# Patient Record
Sex: Female | Born: 1935 | Hispanic: No | Marital: Married | Smoking: Former smoker
Health system: Southern US, Community
[De-identification: ages and names within clinical notes are randomized; demographics above are authoritative.]

## PROBLEM LIST (undated history)

## (undated) DIAGNOSIS — K279 Peptic ulcer, site unspecified, unspecified as acute or chronic, without hemorrhage or perforation: Secondary | ICD-10-CM

## (undated) DIAGNOSIS — K219 Gastro-esophageal reflux disease without esophagitis: Secondary | ICD-10-CM

## (undated) DIAGNOSIS — C801 Malignant (primary) neoplasm, unspecified: Secondary | ICD-10-CM

## (undated) DIAGNOSIS — M5030 Other cervical disc degeneration, unspecified cervical region: Secondary | ICD-10-CM

## (undated) DIAGNOSIS — K229 Disease of esophagus, unspecified: Secondary | ICD-10-CM

## (undated) DIAGNOSIS — M5481 Occipital neuralgia: Secondary | ICD-10-CM

## (undated) DIAGNOSIS — R51 Headache: Secondary | ICD-10-CM

## (undated) DIAGNOSIS — I10 Essential (primary) hypertension: Secondary | ICD-10-CM

## (undated) DIAGNOSIS — M1990 Unspecified osteoarthritis, unspecified site: Secondary | ICD-10-CM

## (undated) DIAGNOSIS — R600 Localized edema: Secondary | ICD-10-CM

## (undated) DIAGNOSIS — K5120 Ulcerative (chronic) proctitis without complications: Secondary | ICD-10-CM

## (undated) DIAGNOSIS — M109 Gout, unspecified: Secondary | ICD-10-CM

## (undated) HISTORY — PX: APPENDECTOMY: SHX54

## (undated) HISTORY — DX: Peptic ulcer, site unspecified, unspecified as acute or chronic, without hemorrhage or perforation: K27.9

## (undated) HISTORY — DX: Gout, unspecified: M10.9

## (undated) HISTORY — PX: CHOLECYSTECTOMY: SHX55

## (undated) HISTORY — PX: DILATION AND CURETTAGE OF UTERUS: SHX78

## (undated) HISTORY — PX: TOTAL ABDOMINAL HYSTERECTOMY W/ BILATERAL SALPINGOOPHORECTOMY: SHX83

## (undated) HISTORY — PX: NECK SURGERY: SHX720

## (undated) HISTORY — PX: ABDOMINAL HYSTERECTOMY: SHX81

## (undated) HISTORY — PX: ESOPHAGOGASTRODUODENOSCOPY: SHX1529

## (undated) HISTORY — PX: ESOPHAGEAL DILATION: SHX303

---

## 1997-08-27 ENCOUNTER — Other Ambulatory Visit: Admission: RE | Admit: 1997-08-27 | Discharge: 1997-08-27 | Payer: Self-pay | Admitting: *Deleted

## 1998-09-14 ENCOUNTER — Other Ambulatory Visit: Admission: RE | Admit: 1998-09-14 | Discharge: 1998-09-14 | Payer: Self-pay | Admitting: *Deleted

## 1999-09-28 ENCOUNTER — Other Ambulatory Visit: Admission: RE | Admit: 1999-09-28 | Discharge: 1999-09-28 | Payer: Self-pay | Admitting: *Deleted

## 2002-12-24 ENCOUNTER — Ambulatory Visit (HOSPITAL_COMMUNITY): Admission: RE | Admit: 2002-12-24 | Discharge: 2002-12-24 | Payer: Self-pay | Admitting: Gastroenterology

## 2006-04-16 ENCOUNTER — Ambulatory Visit (HOSPITAL_COMMUNITY): Admission: RE | Admit: 2006-04-16 | Discharge: 2006-04-16 | Payer: Self-pay | Admitting: Orthopaedic Surgery

## 2006-04-16 ENCOUNTER — Encounter: Payer: Self-pay | Admitting: Vascular Surgery

## 2007-11-15 ENCOUNTER — Emergency Department (HOSPITAL_COMMUNITY): Admission: EM | Admit: 2007-11-15 | Discharge: 2007-11-15 | Payer: Self-pay | Admitting: Emergency Medicine

## 2008-04-17 ENCOUNTER — Emergency Department (HOSPITAL_COMMUNITY): Admission: EM | Admit: 2008-04-17 | Discharge: 2008-04-17 | Payer: Self-pay | Admitting: Emergency Medicine

## 2008-05-13 ENCOUNTER — Emergency Department (HOSPITAL_COMMUNITY): Admission: EM | Admit: 2008-05-13 | Discharge: 2008-05-13 | Payer: Self-pay | Admitting: Emergency Medicine

## 2010-02-02 ENCOUNTER — Encounter: Admission: RE | Admit: 2010-02-02 | Discharge: 2010-02-02 | Payer: Self-pay | Admitting: Internal Medicine

## 2010-03-02 ENCOUNTER — Ambulatory Visit (HOSPITAL_COMMUNITY)
Admission: RE | Admit: 2010-03-02 | Discharge: 2010-03-02 | Payer: Self-pay | Source: Home / Self Care | Admitting: Gastroenterology

## 2010-04-23 ENCOUNTER — Encounter: Payer: Self-pay | Admitting: Orthopaedic Surgery

## 2010-08-18 NOTE — Op Note (Signed)
   NAMEMELODY, CIRRINCIONE                           ACCOUNT NO.:  [id number]   MEDICAL RECORD NO.:  [id number]                   PATIENT TYPE:  AMB   LOCATION:  ENDO                                 FACILITY:  [facility]   PHYSICIAN:  Danise Edge, M.D.                DATE OF BIRTH:  09-Nov-1924   DATE OF PROCEDURE:  12/24/2002  DATE OF DISCHARGE:                                 OPERATIVE REPORT   PROCEDURE PERFORMED:  Colonoscopy.   ENDOSCOPIST:  Charolett Bumpers, M.D.   INDICATIONS FOR PROCEDURE:  Ms. Adlean Hardeman is a 77 year old female born  December 06, 1924.  Ms. Tukes is scheduled to undergo her first screening  colonoscopy with polypectomy to prevent colon cancer.   PREMEDICATION:  Versed 5 mg, Demerol 50 mg.   DESCRIPTION OF PROCEDURE:  After obtaining informed consent, Ms. Blacksher was  placed in the left lateral decubitus position.  I administered intravenous  Demerol and intravenous Versed to achieve conscious sedation for the  procedure.  The patient's blood pressure, oxygen saturations and cardiac  rhythm were monitored throughout the procedure and documented in the medical  record.   Anal inspection was normal.  Digital rectal exam was normal.  The pediatric  Olympus video colonoscope was introduced into the rectum and advanced to the  cecum.  Colonic preparation for the exam today was excellent.   Rectum:  Normal.   Sigmoid colon and descending colon:  Extensive left colonic diverticulosis.   Splenic flexure:  Normal.   Transverse colon:  Normal.   Hepatic flexure:  Normal.   Ascending colon:  Normal.   Cecum and ileocecal valve:  Normal.    ASSESSMENT:  Normal screening proctocolonoscopy to the cecum.  No endoscopic  evidence for the presence of colorectal neoplasia.  Extensive left colonic  diverticulosis without diverticulitis or diverticular stricture formation.                                                Danise Edge, M.D.    MJ/MEDQ  D:   12/24/2002  T:  12/24/2002  Job:  [id number]   cc:   Theressa Millard, M.D.  [address] [place]  [place] [address]  Fax: [phone]

## 2012-03-13 ENCOUNTER — Encounter (HOSPITAL_COMMUNITY): Payer: Self-pay | Admitting: Pharmacy Technician

## 2012-03-17 ENCOUNTER — Encounter (HOSPITAL_COMMUNITY): Payer: Self-pay | Admitting: *Deleted

## 2012-03-17 NOTE — Pre-Procedure Instructions (Signed)
Your procedure is scheduled ZO:[id number], March 25, 2012 Report to [facility] Admitting WU:[id number] Call this number if you have problems morning of your procedure:[phone]  Follow all bowel prep instructions per your doctor's orders.  Do not eat or drink anything after midnight the night before your procedure. You may brush your teeth, rinse out your mouth, but no water, no food, no chewing gum, no mints, no candies, no chewing tobacco.     Take these medicines the morning of your procedure with A SIP OF WATER:Norvasc   Please make arrangements for a responsible person to drive you home after the procedure. You cannot go home by cab/taxi. We recommend you have someone with you at home the first 24 hours after your procedure. Driver for procedure is spouse Willie  LEAVE ALL VALUABLES, JEWELRY, BILLFOLD AT HOME.  NO DENTURES, CONTACT LENSES ALLOWED IN THE ENDOSCOPY ROOM.   YOU MAY WEAR DEODORANT, PLEASE REMOVE ALL JEWELRY, WATCHES RINGS, BODY PIERCINGS AND LEAVE AT HOME.   WOMEN: NO MAKE-UP, LOTIONS PERFUMES

## 2012-03-25 ENCOUNTER — Encounter (HOSPITAL_COMMUNITY): Payer: Self-pay | Admitting: Anesthesiology

## 2012-03-25 ENCOUNTER — Encounter (HOSPITAL_COMMUNITY): Payer: Self-pay | Admitting: *Deleted

## 2012-03-25 ENCOUNTER — Encounter (HOSPITAL_COMMUNITY)
Admission: RE | Disposition: A | Discharge status: Home / Self Care | Payer: Self-pay | Source: Ambulatory Visit | Attending: Gastroenterology

## 2012-03-25 ENCOUNTER — Ambulatory Visit (HOSPITAL_COMMUNITY): Payer: Medicare Other | Admitting: Anesthesiology

## 2012-03-25 ENCOUNTER — Ambulatory Visit (HOSPITAL_COMMUNITY)
Admission: RE | Admit: 2012-03-25 | Discharge: 2012-03-25 | Disposition: A | Discharge status: Home / Self Care | Payer: Medicare Other | Source: Ambulatory Visit | Attending: Gastroenterology | Admitting: Gastroenterology

## 2012-03-25 DIAGNOSIS — K5730 Diverticulosis of large intestine without perforation or abscess without bleeding: Secondary | ICD-10-CM | POA: Insufficient documentation

## 2012-03-25 DIAGNOSIS — K219 Gastro-esophageal reflux disease without esophagitis: Secondary | ICD-10-CM | POA: Insufficient documentation

## 2012-03-25 DIAGNOSIS — I10 Essential (primary) hypertension: Secondary | ICD-10-CM | POA: Insufficient documentation

## 2012-03-25 DIAGNOSIS — K921 Melena: Secondary | ICD-10-CM | POA: Insufficient documentation

## 2012-03-25 DIAGNOSIS — Z79899 Other long term (current) drug therapy: Secondary | ICD-10-CM | POA: Insufficient documentation

## 2012-03-25 DIAGNOSIS — K6289 Other specified diseases of anus and rectum: Secondary | ICD-10-CM | POA: Insufficient documentation

## 2012-03-25 DIAGNOSIS — Z7982 Long term (current) use of aspirin: Secondary | ICD-10-CM | POA: Insufficient documentation

## 2012-03-25 DIAGNOSIS — K222 Esophageal obstruction: Secondary | ICD-10-CM | POA: Insufficient documentation

## 2012-03-25 DIAGNOSIS — M949 Disorder of cartilage, unspecified: Secondary | ICD-10-CM | POA: Insufficient documentation

## 2012-03-25 DIAGNOSIS — M899 Disorder of bone, unspecified: Secondary | ICD-10-CM | POA: Insufficient documentation

## 2012-03-25 HISTORY — DX: Essential (primary) hypertension: I10

## 2012-03-25 HISTORY — DX: Disease of esophagus, unspecified: K22.9

## 2012-03-25 HISTORY — DX: Localized edema: R60.0

## 2012-03-25 HISTORY — DX: Headache: R51

## 2012-03-25 HISTORY — PX: COLONOSCOPY WITH PROPOFOL: SHX5780

## 2012-03-25 HISTORY — DX: Unspecified osteoarthritis, unspecified site: M19.90

## 2012-03-25 SURGERY — COLONOSCOPY WITH PROPOFOL
Anesthesia: Monitor Anesthesia Care

## 2012-03-25 MED ORDER — ONDANSETRON HCL 4 MG/2ML IJ SOLN
INTRAMUSCULAR | Status: DC | PRN
  Administered 2012-03-25: 4 mg via INTRAVENOUS

## 2012-03-25 MED ORDER — KETAMINE HCL 10 MG/ML IJ SOLN
INTRAMUSCULAR | Status: DC | PRN
  Administered 2012-03-25: 20 mg via INTRAVENOUS
  Administered 2012-03-25 (×2): 5 mg via INTRAVENOUS
  Administered 2012-03-25: 15 mg via INTRAVENOUS
  Administered 2012-03-25: 5 mg via INTRAVENOUS

## 2012-03-25 MED ORDER — LIDOCAINE HCL (CARDIAC) 20 MG/ML IV SOLN
INTRAVENOUS | Status: DC | PRN
  Administered 2012-03-25: 100 mg via INTRAVENOUS

## 2012-03-25 MED ORDER — PROPOFOL 10 MG/ML IV EMUL
INTRAVENOUS | Status: DC | PRN
  Administered 2012-03-25: 150 ug/kg/min via INTRAVENOUS

## 2012-03-25 MED ORDER — LACTATED RINGERS IV SOLN
INTRAVENOUS | Status: DC
  Administered 2012-03-25: 09:00:00 via INTRAVENOUS

## 2012-03-25 MED ORDER — DEXAMETHASONE SODIUM PHOSPHATE 10 MG/ML IJ SOLN
INTRAMUSCULAR | Status: DC | PRN
  Administered 2012-03-25: 10 mg via INTRAVENOUS

## 2012-03-25 MED ORDER — METOCLOPRAMIDE HCL 5 MG/ML IJ SOLN
INTRAMUSCULAR | Status: DC | PRN
  Administered 2012-03-25: 10 mg via INTRAVENOUS

## 2012-03-25 SURGICAL SUPPLY — 21 items

## 2012-03-25 NOTE — H&P (Signed)
  Problem: Hematochezia associated with a normal hemoglobin. Normal screening colonoscopy in 2004.  History: The patient is an 78 year old female born 11/21/1924.  2004 patient underwent a normal baseline screening colonoscopy which did show left colonic diverticulosis.  Over the past few weeks, the patient has had intermittent small-volume hematochezia unassociated with abdominal pain or rectal pain.  Patient is scheduled to undergo a diagnostic colonoscopy to evaluate painless hematochezia.  Chronic medications: Aspirin. Calcium. Vitamin D. MiraLAX. Tylenol. Potassium. Fish oil. Hydrochlorothiazide. Amlodipine. Estradiol.  Past medical and surgical history: Abdominal hysterectomy with bilateral salpingo-oophorectomy. Cholecystectomy. Cervical disc surgery. Appendectomy. Hypertension. Osteoarthritis of both knees. Osteopenia. Gout. History of peptic ulcer disease. Gastroesophageal reflux complicated by a benign distal esophageal stricture. Osteopenia.  Medication allergies: Lisinopril causes tongue swelling.  Exam: The patient is alert and lying comfortably on the endoscopy stretcher. Mouth and throat appear normal. Lungs are clear to auscultation. Cardiac exam reveals a regular rhythm without audible murmurs. Abdomen is soft, flat, and nontender to palpation in all quadrants.  Plan: Proceed with diagnostic colonoscopy to evaluate painless hematochezia.

## 2012-03-25 NOTE — Anesthesia Preprocedure Evaluation (Signed)
Anesthesia Evaluation  Patient identified by MRN, date of birth, ID band Patient awake    Reviewed: Allergy & Precautions, H&P , NPO status , Patient's Chart, lab work & pertinent test results, reviewed documented beta blocker date and time   Airway Mallampati: II TM Distance: >3 FB Neck ROM: full    Dental No notable dental hx.    Pulmonary neg pulmonary ROS,  breath sounds clear to auscultation  Pulmonary exam normal       Cardiovascular Exercise Tolerance: Good hypertension, On Medications Rhythm:regular Rate:Normal     Neuro/Psych  Headaches, negative neurological ROS  negative psych ROS   GI/Hepatic negative GI ROS, Neg liver ROS,   Endo/Other  negative endocrine ROS  Renal/GU negative Renal ROS  negative genitourinary   Musculoskeletal   Abdominal   Peds  Hematology negative hematology ROS (+)   Anesthesia Other Findings   Reproductive/Obstetrics negative OB ROS                           Anesthesia Physical Anesthesia Plan  ASA: III  Anesthesia Plan: MAC   Post-op Pain Management:    Induction:   Airway Management Planned:   Additional Equipment:   Intra-op Plan:   Post-operative Plan:   Informed Consent: I have reviewed the patients History and Physical, chart, labs and discussed the procedure including the risks, benefits and alternatives for the proposed anesthesia with the patient or authorized representative who has indicated his/her understanding and acceptance.   Dental Advisory Given  Plan Discussed with: CRNA  Anesthesia Plan Comments:         Anesthesia Quick Evaluation

## 2012-03-25 NOTE — Op Note (Signed)
Procedure: Diagnostic colonoscopy  Indication: Painless hematochezia  Endoscopist: Danise Edge  Premedication: Propofol administered by anesthesia  Procedure: The patient was placed in the left lateral decubitus position. Anal inspection and digital rectal exam were normal. The Pentax pediatric colonoscope was introduced into the rectum and advanced to the cecum. A normal-appearing ileocecal valve and appendiceal orifice were identified. Colonic preparation for the exam today was good.  Rectum: There is generalized proctocolitis manifested by the loss of the mucosal vascular pattern and as a granular appearing rectal mucosa with slight mucosal friability but no ulcers. Biopsies were performed.  Sigmoid colon and descending colon. Extensive left colonic diverticulosis.  Splenic flexure: Normal.  Transverse colon. Normal.  Hepatic flexure. Normal  Ascending colon. Normal.  Cecum and ileocecal valve. Normal.  Assessment: #1. Extensive left colonic diverticulosis #2. Mild generalized proctitis with biopsies pending

## 2012-03-25 NOTE — Anesthesia Postprocedure Evaluation (Signed)
  Anesthesia Post-op Note  Patient: Tiffany [name]  Procedure(s) Performed: Procedure(s) (LRB): COLONOSCOPY WITH PROPOFOL (N/A)  Patient Location: PACU  Anesthesia Type: MAC  Level of Consciousness: awake and alert   Airway and Oxygen Therapy: Patient Spontanous Breathing  Post-op Pain: mild  Post-op Assessment: Post-op Vital signs reviewed, Patient's Cardiovascular Status Stable, Respiratory Function Stable, Patent Airway and No signs of Nausea or vomiting  Last Vitals:  Filed Vitals:   03/25/12 1020  BP: 132/78  Temp:   Resp:     Post-op Vital Signs: stable   Complications: No apparent anesthesia complications

## 2012-03-25 NOTE — Preoperative (Signed)
Beta Blockers   Reason not to administer Beta Blockers:Not Applicable, not on home BB 

## 2012-03-25 NOTE — Transfer of Care (Signed)
Immediate Anesthesia Transfer of Care Note  Patient: Tiffany [name]  Procedure(s) Performed: Procedure(s) (LRB) with comments: COLONOSCOPY WITH PROPOFOL (N/A)  Patient Location: PACU and Endoscopy Unit  Anesthesia Type:MAC  Level of Consciousness: awake, alert , oriented, patient cooperative and responds to stimulation  Airway & Oxygen Therapy: Patient Spontanous Breathing and Patient connected to face mask oxygen  Post-op Assessment: Report given to PACU RN, Post -op Vital signs reviewed and stable and Patient moving all extremities  Post vital signs: Reviewed and stable  Complications: No apparent anesthesia complications

## 2012-03-27 ENCOUNTER — Encounter (HOSPITAL_COMMUNITY): Payer: Self-pay | Admitting: Gastroenterology

## 2012-04-02 DIAGNOSIS — K5120 Ulcerative (chronic) proctitis without complications: Secondary | ICD-10-CM

## 2012-04-02 HISTORY — DX: Ulcerative (chronic) proctitis without complications: K51.20

## 2014-04-21 ENCOUNTER — Other Ambulatory Visit: Payer: Self-pay | Admitting: Gastroenterology

## 2014-04-26 ENCOUNTER — Encounter (HOSPITAL_COMMUNITY)
Admission: RE | Disposition: A | Discharge status: Home / Self Care | Payer: Self-pay | Source: Ambulatory Visit | Attending: Gastroenterology

## 2014-04-26 ENCOUNTER — Encounter (HOSPITAL_COMMUNITY): Payer: Self-pay | Admitting: *Deleted

## 2014-04-26 ENCOUNTER — Ambulatory Visit (HOSPITAL_COMMUNITY)
Admission: RE | Admit: 2014-04-26 | Discharge: 2014-04-26 | Disposition: A | Discharge status: Home / Self Care | Payer: Medicare Other | Source: Ambulatory Visit | Attending: Gastroenterology | Admitting: Gastroenterology

## 2014-04-26 DIAGNOSIS — M8580 Other specified disorders of bone density and structure, unspecified site: Secondary | ICD-10-CM | POA: Insufficient documentation

## 2014-04-26 DIAGNOSIS — M5030 Other cervical disc degeneration, unspecified cervical region: Secondary | ICD-10-CM | POA: Diagnosis not present

## 2014-04-26 DIAGNOSIS — K219 Gastro-esophageal reflux disease without esophagitis: Secondary | ICD-10-CM | POA: Insufficient documentation

## 2014-04-26 DIAGNOSIS — M170 Bilateral primary osteoarthritis of knee: Secondary | ICD-10-CM | POA: Insufficient documentation

## 2014-04-26 DIAGNOSIS — K5120 Ulcerative (chronic) proctitis without complications: Secondary | ICD-10-CM | POA: Insufficient documentation

## 2014-04-26 DIAGNOSIS — I10 Essential (primary) hypertension: Secondary | ICD-10-CM | POA: Diagnosis not present

## 2014-04-26 DIAGNOSIS — M5481 Occipital neuralgia: Secondary | ICD-10-CM | POA: Insufficient documentation

## 2014-04-26 HISTORY — PX: FLEXIBLE SIGMOIDOSCOPY: SHX5431

## 2014-04-26 SURGERY — SIGMOIDOSCOPY, FLEXIBLE
Anesthesia: Moderate Sedation

## 2014-04-26 MED ORDER — FLEET ENEMA 7-19 GM/118ML RE ENEM
ENEMA | RECTAL | Status: AC
  Filled 2014-04-26: qty 1

## 2014-04-26 MED ORDER — SODIUM CHLORIDE 0.9 % IV SOLN: INTRAVENOUS | Status: DC

## 2014-04-26 MED ORDER — FLEET ENEMA 7-19 GM/118ML RE ENEM
1.0000 | ENEMA | Freq: Once | RECTAL | Status: AC
  Administered 2014-04-26: 1 via RECTAL

## 2014-04-26 NOTE — Discharge Instructions (Signed)
Flexible Sigmoidoscopy, Care After °Refer to this sheet in the next few weeks. These instructions provide you with information on caring for yourself after your procedure. Your health care provider may also give you more specific instructions. Your treatment has been planned according to current medical practices, but problems sometimes occur. Call your health care provider if you have any problems or questions after your procedure. °WHAT TO EXPECT AFTER THE PROCEDURE °After your procedure, it is typical to have the following:  °· Abdominal cramps. °· Bloating. °· A small amount of rectal bleeding if you had a biopsy. °HOME CARE INSTRUCTIONS °· Only take over-the-counter or prescription medicines for pain, fever, or discomfort as directed by your health care provider. °· Resume your normal diet and activities as directed by your health care provider. °SEEK MEDICAL CARE IF: °· You have abdominal pain or cramping that lasts longer than 1 hour after the procedure. °· You continue to have small amounts of rectal bleeding after 24 hours. °· You have nausea or vomiting. °· You feel weak or dizzy. °SEEK IMMEDIATE MEDICAL CARE IF:  °· You have a fever. °· You pass large blood clots or see a large amount of blood in the toilet after having a bowel movement. This may also occur 10-14 days after the procedure. It is more likely if you had a biopsy. °· You develop abdominal pain that is not relieved with medicine or your abdominal pain gets worse. °· You have nausea or vomiting for more than 24 hours after the procedure. °Document Released: 03/24/2013 Document Reviewed: 03/24/2013 °ExitCare® Patient Information ©2015 [employer]. This information is not intended to replace advice given to you by your health care provider. Make sure you discuss any questions you have with your health care provider. ° °

## 2014-04-26 NOTE — Op Note (Signed)
Procedure: Diagnostic proctosigmoidoscopy. 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 Resolved hematochezia.  Endoscopist: Earle Gell  Premedication: None  Procedure: The patient was placed in the left lateral decubitus position. Anal inspection and digital rectal exam were normal. Prior to the exam today, the patient received 2 fleets enemas in the endoscopy suite. The Pentax pediatric colonoscope was introduced into the rectum and advanced to approximately 25 cm from the anal verge. There was a moderate amount of retained liquid and solid stool in the rectum. The colonic mucosa appeared normal. There was no endoscopic evidence for the presence of ulcerative proctitis.  Assessment: Normal flexible proctosigmoidoscopy performed to 25 cm from the anal verge.

## 2014-04-26 NOTE — H&P (Signed)
  Problems: Hematochezia and rectal pain. 03/25/2012 colonoscopy showed ulcerative proctitis.  History: The patient is an 81 year old female born 1924/11/20. In December 2013, she underwent a normal screening colonoscopy. Ulcerative proctitis was diagnosed.  The patient describes diminished rectal sensation prior to having a bowel movement and difficulty expelling a bowel movement leaving her with the sensation of incomplete evacuation. Last week she had an episode of small volume hematochezia which resolved after 2 days of mesalamine rectal suppositories. She also has difficulty emptying her urinary bladder. She has developed rectal discomfort.  She is scheduled to undergo diagnostic flexible proctosigmoidoscopy.  Past medical history: Hypertension. Osteoarthritis of the knees. Cervical degenerative disc disease. Osteopenia. Gastroesophageal reflux. Occipital neuralgia. Ulcerative proctitis. Hysterectomy. BSO. Cholecystectomy. Cervical spine surgery. Appendectomy. Colonoscopy.  Medication allergies: Lisinopril causes tongue swelling.  Exam: The patient is alert and lying comfortably on the endoscopy stretcher. Abdomen is soft and nontender to palpation. Lungs are clear to auscultation. Cardiac exam reveals a regular rhythm.  Plan: Proceed with diagnostic flexible proctosigmoidoscopy

## 2014-04-27 ENCOUNTER — Encounter (HOSPITAL_COMMUNITY): Payer: Self-pay | Admitting: Gastroenterology

## 2014-09-01 DIAGNOSIS — C801 Malignant (primary) neoplasm, unspecified: Secondary | ICD-10-CM

## 2014-09-01 HISTORY — DX: Malignant (primary) neoplasm, unspecified: C80.1

## 2014-10-16 ENCOUNTER — Encounter (HOSPITAL_COMMUNITY): Payer: Self-pay | Admitting: *Deleted

## 2014-10-16 ENCOUNTER — Observation Stay (HOSPITAL_COMMUNITY)
Admission: EM | Admit: 2014-10-16 | Discharge: 2014-10-18 | Disposition: A | Discharge status: Home / Self Care | Payer: Medicare Other | Attending: Internal Medicine | Admitting: Internal Medicine

## 2014-10-16 ENCOUNTER — Emergency Department (HOSPITAL_COMMUNITY): Payer: Medicare Other

## 2014-10-16 DIAGNOSIS — I700 Atherosclerosis of aorta: Secondary | ICD-10-CM | POA: Insufficient documentation

## 2014-10-16 DIAGNOSIS — R0989 Other specified symptoms and signs involving the circulatory and respiratory systems: Secondary | ICD-10-CM | POA: Insufficient documentation

## 2014-10-16 DIAGNOSIS — I10 Essential (primary) hypertension: Secondary | ICD-10-CM | POA: Diagnosis not present

## 2014-10-16 DIAGNOSIS — R509 Fever, unspecified: Secondary | ICD-10-CM | POA: Diagnosis present

## 2014-10-16 DIAGNOSIS — Z87891 Personal history of nicotine dependence: Secondary | ICD-10-CM | POA: Diagnosis not present

## 2014-10-16 DIAGNOSIS — A09 Infectious gastroenteritis and colitis, unspecified: Secondary | ICD-10-CM | POA: Diagnosis not present

## 2014-10-16 DIAGNOSIS — R197 Diarrhea, unspecified: Principal | ICD-10-CM | POA: Insufficient documentation

## 2014-10-16 DIAGNOSIS — N179 Acute kidney failure, unspecified: Secondary | ICD-10-CM | POA: Diagnosis not present

## 2014-10-16 DIAGNOSIS — N289 Disorder of kidney and ureter, unspecified: Secondary | ICD-10-CM

## 2014-10-16 DIAGNOSIS — N390 Urinary tract infection, site not specified: Secondary | ICD-10-CM | POA: Insufficient documentation

## 2014-10-16 DIAGNOSIS — R0902 Hypoxemia: Secondary | ICD-10-CM

## 2014-10-16 HISTORY — DX: Occipital neuralgia: M54.81

## 2014-10-16 HISTORY — DX: Gastro-esophageal reflux disease without esophagitis: K21.9

## 2014-10-16 HISTORY — DX: Ulcerative (chronic) proctitis without complications: K51.20

## 2014-10-16 HISTORY — DX: Other cervical disc degeneration, unspecified cervical region: M50.30

## 2014-10-16 LAB — URINALYSIS, ROUTINE W REFLEX MICROSCOPIC
BILIRUBIN URINE: NEGATIVE
Glucose, UA: NEGATIVE mg/dL
Hgb urine dipstick: NEGATIVE
KETONES UR: 15 mg/dL — AB
LEUKOCYTES UA: NEGATIVE
Nitrite: NEGATIVE
PH: 6.5 (ref 5.0–8.0)
Protein, ur: NEGATIVE mg/dL
SPECIFIC GRAVITY, URINE: 1.013 (ref 1.005–1.030)
UROBILINOGEN UA: 0.2 mg/dL (ref 0.0–1.0)

## 2014-10-16 LAB — CBC
HCT: 35.2 % — ABNORMAL LOW (ref 36.0–46.0)
Hemoglobin: 10.9 g/dL — ABNORMAL LOW (ref 12.0–15.0)
MCH: 27.9 pg (ref 26.0–34.0)
MCHC: 31 g/dL (ref 30.0–36.0)
MCV: 90 fL (ref 78.0–100.0)
PLATELETS: 377 10*3/uL (ref 150–400)
RBC: 3.91 MIL/uL (ref 3.87–5.11)
RDW: 16.5 % — AB (ref 11.5–15.5)
WBC: 11.2 10*3/uL — ABNORMAL HIGH (ref 4.0–10.5)

## 2014-10-16 LAB — BASIC METABOLIC PANEL
ANION GAP: 10 (ref 5–15)
BUN: 21 mg/dL — AB (ref 6–20)
CO2: 24 mmol/L (ref 22–32)
Calcium: 9.8 mg/dL (ref 8.9–10.3)
Chloride: 101 mmol/L (ref 101–111)
Creatinine, Ser: 1.52 mg/dL — ABNORMAL HIGH (ref 0.44–1.00)
GFR calc non Af Amer: 29 mL/min — ABNORMAL LOW (ref >60)
GFR, EST AFRICAN AMERICAN: 34 mL/min — AB (ref >60)
GLUCOSE: 174 mg/dL — AB (ref 65–99)
POTASSIUM: 4.5 mmol/L (ref 3.5–5.1)
Sodium: 135 mmol/L (ref 135–145)

## 2014-10-16 LAB — I-STAT TROPONIN, ED: Troponin i, poc: 0 ng/mL (ref 0.00–0.08)

## 2014-10-16 LAB — BRAIN NATRIURETIC PEPTIDE: B Natriuretic Peptide: 114.2 pg/mL — ABNORMAL HIGH (ref 0.0–100.0)

## 2014-10-16 LAB — I-STAT CG4 LACTIC ACID, ED: LACTIC ACID, VENOUS: 1.67 mmol/L (ref 0.5–2.0)

## 2014-10-16 MED ORDER — AMLODIPINE BESYLATE 5 MG PO TABS
5.0000 mg | ORAL_TABLET | Freq: Every day | ORAL | Status: DC
  Administered 2014-10-17 – 2014-10-18 (×2): 5 mg via ORAL
  Filled 2014-10-16 (×4): qty 1

## 2014-10-16 MED ORDER — ACETAMINOPHEN 500 MG PO TABS
1000.0000 mg | ORAL_TABLET | Freq: Once | ORAL | Status: AC
  Administered 2014-10-16: 1000 mg via ORAL
  Filled 2014-10-16: qty 2

## 2014-10-16 MED ORDER — ACETAMINOPHEN 650 MG RE SUPP: 650.0000 mg | Freq: Four times a day (QID) | RECTAL | Status: DC | PRN

## 2014-10-16 MED ORDER — CIPROFLOXACIN IN D5W 200 MG/100ML IV SOLN
200.0000 mg | Freq: Two times a day (BID) | INTRAVENOUS | Status: DC
  Administered 2014-10-16 – 2014-10-17 (×2): 200 mg via INTRAVENOUS
  Filled 2014-10-16 (×3): qty 100

## 2014-10-16 MED ORDER — ESTRADIOL 1 MG PO TABS
0.5000 mg | ORAL_TABLET | Freq: Every day | ORAL | Status: DC
  Administered 2014-10-16 – 2014-10-18 (×3): 0.5 mg via ORAL
  Filled 2014-10-16 (×4): qty 0.5

## 2014-10-16 MED ORDER — PHENAZOPYRIDINE HCL 100 MG PO TABS
95.0000 mg | ORAL_TABLET | Freq: Three times a day (TID) | ORAL | Status: DC | PRN
  Filled 2014-10-16: qty 1

## 2014-10-16 MED ORDER — CALCIUM CARBONATE-VITAMIN D 500-200 MG-UNIT PO TABS
1.0000 | ORAL_TABLET | Freq: Every day | ORAL | Status: DC
  Administered 2014-10-17 – 2014-10-18 (×2): 1 via ORAL
  Filled 2014-10-16 (×3): qty 1

## 2014-10-16 MED ORDER — SODIUM CHLORIDE 0.9 % IV SOLN: INTRAVENOUS | Status: DC

## 2014-10-16 MED ORDER — SODIUM CHLORIDE 0.9 % IV SOLN
INTRAVENOUS | Status: DC
  Administered 2014-10-16 – 2014-10-17 (×3): via INTRAVENOUS

## 2014-10-16 MED ORDER — ACETAMINOPHEN 325 MG PO TABS
650.0000 mg | ORAL_TABLET | Freq: Four times a day (QID) | ORAL | Status: DC
  Administered 2014-10-16 – 2014-10-18 (×6): 650 mg via ORAL
  Filled 2014-10-16 (×6): qty 2

## 2014-10-16 MED ORDER — COLCHICINE 0.6 MG PO TABS
0.6000 mg | ORAL_TABLET | ORAL | Status: DC | PRN
Start: 2014-10-16 — End: 2014-10-18

## 2014-10-16 MED ORDER — METRONIDAZOLE IN NACL 5-0.79 MG/ML-% IV SOLN
500.0000 mg | Freq: Three times a day (TID) | INTRAVENOUS | Status: DC
  Administered 2014-10-16 – 2014-10-17 (×3): 500 mg via INTRAVENOUS
  Filled 2014-10-16 (×4): qty 100

## 2014-10-16 MED ORDER — HEPARIN SODIUM (PORCINE) 5000 UNIT/ML IJ SOLN
5000.0000 [IU] | Freq: Three times a day (TID) | INTRAMUSCULAR | Status: DC
  Administered 2014-10-16 – 2014-10-18 (×5): 5000 [IU] via SUBCUTANEOUS
  Filled 2014-10-16 (×9): qty 1

## 2014-10-16 MED ORDER — ACETAMINOPHEN 325 MG PO TABS: 650.0000 mg | ORAL_TABLET | Freq: Four times a day (QID) | ORAL | Status: DC | PRN

## 2014-10-16 NOTE — ED Notes (Signed)
Pt reports having recent UTI and has been on two different antibiotics but now having chills.

## 2014-10-16 NOTE — Progress Notes (Signed)
Received report from Lennette Bihari, RN in ED for transfer of pt to [phone].

## 2014-10-16 NOTE — H&P (Signed)
[facility] History and Physical  Tiffany [name] RCB:[id number] DOB: 1924/07/06 DOA: 10/16/2014  Referring physician: EDP PCP: Kandice Hams, MD   Chief Complaint: Fever   HPI: Tiffany [name] is a 81 y.o. female who presents to the ED with c/o 1 day history of fever.  She has been having dysuria for the past 2-3 months, numerous courses of ABx have not improved this symptom.  Today she has had multiple episodes of diarrhea which are new, and also has developed "chills".  UA was negative in the ED, Tm in ED was 102.1.  Review of Systems: Systems reviewed.  As above, otherwise negative  Past Medical History  Diagnosis Date  . Hypertension   . Edema leg     feet and ankles  . Headache(784.0)   . Esophagus disorder     Had esophagus stretched  . Arthritis      knees 03-10-12 had Cortisone injection  . Occipital neuralgia   . GERD (gastroesophageal reflux disease)   . Ulcerative proctitis   . DDD (degenerative disc disease), cervical    Past Surgical History  Procedure Laterality Date  . Cholecystectomy    . Appendectomy    . Abdominal hysterectomy    . Dilation and curettage of uterus    . Neck surgery      20 years ago  . Esophagogastroduodenoscopy    . Colonoscopy with propofol  03/25/2012    Procedure: COLONOSCOPY WITH PROPOFOL;  Surgeon: Garlan Fair, MD;  Location: [facility];  Service: Endoscopy;  Laterality: N/A;  . Flexible sigmoidoscopy N/A 04/26/2014    Procedure: FLEXIBLE SIGMOIDOSCOPY - UnSedated;  Surgeon: Garlan Fair, MD;  Location: [facility];  Service: Endoscopy;  Laterality: N/A;   Social History:  reports that she quit smoking about 30 years ago. She has never used smokeless tobacco. She reports that she drinks alcohol. She reports that she does not use illicit drugs.  Allergies  Allergen Reactions  . Indomethacin Swelling and Other (See Comments)    TONGUE SWOLLEN  . Lisinopril Swelling and Other (See Comments)    Tongue swelling     History reviewed. No pertinent family history.   Prior to Admission medications   Medication Sig Start Date End Date Taking? Authorizing Provider  acetaminophen (TYLENOL) 650 MG CR tablet Take 1,300 mg by mouth 2 (two) times daily.   Yes Historical Provider, MD  amLODipine (NORVASC) 5 MG tablet Take 5 mg by mouth daily before breakfast.   Yes Historical Provider, MD  calcium-vitamin D (OSCAL WITH D) 500-200 MG-UNIT per tablet Take 1 tablet by mouth daily.   Yes Historical Provider, MD  colchicine 0.6 MG tablet Take 0.6 mg by mouth as needed (FOR GOUT FLAREUP).   Yes Historical Provider, MD  estradiol (ESTRACE) 0.5 MG tablet Take 0.5 mg by mouth daily. 08/02/14  Yes Historical Provider, MD  hydrochlorothiazide (HYDRODIURIL) 25 MG tablet Take 25 mg by mouth daily before breakfast.   Yes Historical Provider, MD  nitrofurantoin (MACRODANTIN) 100 MG capsule Take 100 mg by mouth 2 (two) times daily. FOR 7 DAYS 10/15/14 10/21/14 Yes Historical Provider, MD  Omega-3 Fatty Acids (FISH OIL) 300 MG CAPS Take 1 capsule by mouth every evening.   Yes Historical Provider, MD  phenazopyridine (PYRIDIUM) 95 MG tablet Take 95 mg by mouth 3 (three) times daily as needed for pain.   Yes Historical Provider, MD  potassium chloride SA (K-DUR,KLOR-CON) 20 MEQ tablet Take 20 mEq by mouth every evening.  Yes Historical Provider, MD   Physical Exam: Filed Vitals:   10/16/14 1939  BP: 127/60  Pulse: 97  Temp:   Resp: 24    BP 127/60 mmHg  Pulse 97  Temp(Src) 102.1 F (38.9 C) (Rectal)  Resp 24  SpO2 92%  General Appearance:    Alert, oriented, no distress, appears stated age  Head:    Normocephalic, atraumatic  Eyes:    PERRL, EOMI, sclera non-icteric        Nose:   Nares without drainage or epistaxis. Mucosa, turbinates normal  Throat:   Moist mucous membranes. Oropharynx without erythema or exudate.  Neck:   Supple. No carotid bruits.  No thyromegaly.  No lymphadenopathy.   Back:     No CVA  tenderness, no spinal tenderness  Lungs:     Clear to auscultation bilaterally, without wheezes, rhonchi or rales  Chest wall:    No tenderness to palpitation  Heart:    Regular rate and rhythm without murmurs, gallops, rubs  Abdomen:     Soft, non-tender, nondistended, normal bowel sounds, no organomegaly  Genitalia:    deferred  Rectal:    deferred  Extremities:   No clubbing, cyanosis or edema.  Pulses:   2+ and symmetric all extremities  Skin:   Skin color, texture, turgor normal, no rashes or lesions  Lymph nodes:   Cervical, supraclavicular, and axillary nodes normal  Neurologic:   CNII-XII intact. Normal strength, sensation and reflexes      throughout    Labs on Admission:  Basic Metabolic Panel:  Recent Labs Lab 10/16/14 1611  NA 135  K 4.5  CL 101  CO2 24  GLUCOSE 174*  BUN 21*  CREATININE 1.52*  CALCIUM 9.8   Liver Function Tests: No results for input(s): AST, ALT, ALKPHOS, BILITOT, PROT, ALBUMIN in the last 168 hours. No results for input(s): LIPASE, AMYLASE in the last 168 hours. No results for input(s): AMMONIA in the last 168 hours. CBC:  Recent Labs Lab 10/16/14 1611  WBC 11.2*  HGB 10.9*  HCT 35.2*  MCV 90.0  PLT 377   Cardiac Enzymes: No results for input(s): CKTOTAL, CKMB, CKMBINDEX, TROPONINI in the last 168 hours.  BNP (last 3 results) No results for input(s): PROBNP in the last 8760 hours. CBG: No results for input(s): GLUCAP in the last 168 hours.  Radiological Exams on Admission: Dg Chest 2 View  10/16/2014   CLINICAL DATA:  Urinary tract infection.  Taking antibiotics.  EXAM: CHEST  2 VIEW  COMPARISON:  02/02/2010  FINDINGS: The heart size and mediastinal contours are within normal limits. Pulmonary vascular congestion noted. Aortic atherosclerosis noted. Both lungs are clear. The visualized skeletal structures are unremarkable.  IMPRESSION: 1. Pulmonary vascular congestion 2. Aortic atherosclerosis.   Electronically Signed   By: Kerby Moors M.D.   On: 10/16/2014 18:37    EKG: Independently reviewed.  Assessment/Plan Principal Problem:   Diarrhea, infectious, adult Active Problems:   Fever   Renal insufficiency   1. Diarrhea - presumed infectious and the cause of her fever 1. Empiric cipro / flagyl 2. Tylenol PRN fever 3. GI pathogen panel 2. Renal insufficiency - unclear baseline 1. Gentle hydration with IVF and hold diuretics in case this is acute 2. Repeat BMP in AM   Code Status: Full Code  Family Communication: Husband at bedside Disposition Plan: Admit to obs   Time spent: 50 min  GARDNER, JARED M. [facility] Pager [phone]  If 7AM-7PM, please  contact the day team taking care of the patient [email or web address] Password [email or web address] 10/16/2014, 8:11 PM

## 2014-10-16 NOTE — Progress Notes (Signed)
ANTIBIOTIC CONSULT NOTE - INITIAL  Pharmacy Consult for ciprofloxacin  Indication: intra-abdominal infection  Allergies  Allergen Reactions  . Indomethacin Swelling and Other (See Comments)    TONGUE SWOLLEN  . Lisinopril Swelling and Other (See Comments)    Tongue swelling   Vital Signs: Temp: 102.1 F (38.9 C) (07/16 1840) Temp Source: Rectal (07/16 1840) BP: 127/60 mmHg (07/16 1939) Pulse Rate: 97 (07/16 1939) Intake/Output from previous day:   Intake/Output from this shift:    Labs:  Recent Labs  10/16/14 1611  WBC 11.2*  HGB 10.9*  PLT 377  CREATININE 1.52*   CrCl cannot be calculated (Unknown ideal weight.). No results for input(s): VANCOTROUGH, VANCOPEAK, VANCORANDOM, GENTTROUGH, GENTPEAK, GENTRANDOM, TOBRATROUGH, TOBRAPEAK, TOBRARND, AMIKACINPEAK, AMIKACINTROU, AMIKACIN in the last 72 hours.   Microbiology: No results found for this or any previous visit (from the past 720 hour(s)).  Medical History: Past Medical History  Diagnosis Date  . Hypertension   . Edema leg     feet and ankles  . Headache(784.0)   . Esophagus disorder     Had esophagus stretched  . Arthritis      knees 03-10-12 had Cortisone injection  . Occipital neuralgia   . GERD (gastroesophageal reflux disease)   . Ulcerative proctitis   . DDD (degenerative disc disease), cervical    Assessment: 81 year old female presenting with a 1 day hx of fever, new onset diarrhea and chills. 2 - 3 month hx of dysuria with no relief s/p multiple abx  ID: Presumed infectious diarrhea. Febrile to 102.33F. WBC 11.2  Cipro 7/16>> Metronidazole 7/16>>  Stool Panel C Dif Blood x 2 Urine  Plan:  -Cipro 200 mg IV q12h -F/U clinical status, renal function, cultures  Levester Fresh, PharmD, BCPS Clinical Pharmacist Pager [phone] 10/16/2014 8:30 PM

## 2014-10-16 NOTE — ED Provider Notes (Signed)
CSN: [id number]     Arrival date & time 10/16/14  1449 History   First MD Initiated Contact with Patient 10/16/14 1720     Chief Complaint  Patient presents with  . Urinary Tract Infection  . Fever     HPI Pt was seen at 1740.  Per pt, c/o gradual onset and persistence of constant urinary frequency and urgency for the past 2 to 3 months. Pt states she has been on several courses of abx "for a UTI" without improvement. Pt was evaluated by Uro MD yesterday and rx macrobid. Pt states today she "felt chilled," so she came to the ED for evaluation. Pt did not take her temperature at home. Denies flank pain, no objective fevers, no abd pain, no N/V/D, no vaginal bleeding/discharge, no rash.   Past Medical History  Diagnosis Date  . Hypertension   . Edema leg     feet and ankles  . Headache(784.0)   . Esophagus disorder     Had esophagus stretched  . Arthritis      knees 03-10-12 had Cortisone injection  . Occipital neuralgia   . GERD (gastroesophageal reflux disease)   . Ulcerative proctitis   . DDD (degenerative disc disease), cervical    Past Surgical History  Procedure Laterality Date  . Cholecystectomy    . Appendectomy    . Abdominal hysterectomy    . Dilation and curettage of uterus    . Neck surgery      20 years ago  . Esophagogastroduodenoscopy    . Colonoscopy with propofol  03/25/2012    Procedure: COLONOSCOPY WITH PROPOFOL;  Surgeon: Garlan Fair, MD;  Location: [facility];  Service: Endoscopy;  Laterality: N/A;  . Flexible sigmoidoscopy N/A 04/26/2014    Procedure: FLEXIBLE SIGMOIDOSCOPY - UnSedated;  Surgeon: Garlan Fair, MD;  Location: [facility];  Service: Endoscopy;  Laterality: N/A;    History  Substance Use Topics  . Smoking status: Former Smoker    Quit date: 11/01/1983  . Smokeless tobacco: Never Used  . Alcohol Use: Yes    Review of Systems ROS: Statement: All systems negative except as marked or noted in the HPI; Constitutional: Negative  for fever and +chills. ; ; Eyes: Negative for eye pain, redness and discharge. ; ; ENMT: Negative for ear pain, hoarseness, nasal congestion, sinus pressure and sore throat. ; ; Cardiovascular: Negative for chest pain, palpitations, diaphoresis, dyspnea and peripheral edema. ; ; Respiratory: Negative for cough, wheezing and stridor. ; ; Gastrointestinal: Negative for nausea, vomiting, diarrhea, abdominal pain, blood in stool, hematemesis, jaundice and rectal bleeding. . ; ; Genitourinary: +urinary frequency/urgency. Negative for flank pain and hematuria. ; ; Musculoskeletal: Negative for back pain and neck pain. Negative for swelling and trauma.; ; Skin: Negative for pruritus, rash, abrasions, blisters, bruising and skin lesion.; ; Neuro: Negative for headache, lightheadedness and neck stiffness. Negative for weakness, altered level of consciousness , altered mental status, extremity weakness, paresthesias, involuntary movement, seizure and syncope.     Allergies  Indomethacin and Lisinopril  Home Medications   Prior to Admission medications   Medication Sig Start Date End Date Taking? Authorizing Provider  acetaminophen (TYLENOL) 650 MG CR tablet Take 1,300 mg by mouth 2 (two) times daily.   Yes Historical Provider, MD  amLODipine (NORVASC) 5 MG tablet Take 5 mg by mouth daily before breakfast.   Yes Historical Provider, MD  calcium-vitamin D (OSCAL WITH D) 500-200 MG-UNIT per tablet Take 1 tablet by mouth daily.  Yes Historical Provider, MD  colchicine 0.6 MG tablet Take 0.6 mg by mouth as needed (FOR GOUT FLAREUP).   Yes Historical Provider, MD  estradiol (ESTRACE) 0.5 MG tablet Take 0.5 mg by mouth daily. 08/02/14  Yes Historical Provider, MD  hydrochlorothiazide (HYDRODIURIL) 25 MG tablet Take 25 mg by mouth daily before breakfast.   Yes Historical Provider, MD  nitrofurantoin (MACRODANTIN) 100 MG capsule Take 100 mg by mouth 2 (two) times daily. FOR 7 DAYS 10/15/14 10/21/14 Yes Historical  Provider, MD  Omega-3 Fatty Acids (FISH OIL) 300 MG CAPS Take 1 capsule by mouth every evening.   Yes Historical Provider, MD  phenazopyridine (PYRIDIUM) 95 MG tablet Take 95 mg by mouth 3 (three) times daily as needed for pain.   Yes Historical Provider, MD  potassium chloride SA (K-DUR,KLOR-CON) 20 MEQ tablet Take 20 mEq by mouth every evening.   Yes Historical Provider, MD   BP 125/52 mmHg  Pulse 106  Temp(Src) 98.7 F (37.1 C) (Oral)  Resp 27  SpO2 95%    Patient Vitals for the past 24 hrs:  BP Temp Temp src Pulse Resp SpO2  10/16/14 1939 127/60 mmHg - - 97 24 92 %  10/16/14 1930 127/60 mmHg - - 101 26 95 %  10/16/14 1853 - - - 107 19 97 %  10/16/14 1851 132/55 mmHg - - - - -  10/16/14 1840 - 102.1 F (38.9 C) Rectal - - -  10/16/14 1730 (!) 125/52 mmHg - - 106 (!) 27 95 %  10/16/14 1710 135/57 mmHg - - 103 (!) 32 95 %  10/16/14 1707 - - - - - 96 %  10/16/14 1705 142/70 mmHg - - 104 25 (!) 88 %  10/16/14 1701 148/60 mmHg - - 104 26 91 %  10/16/14 1456 146/64 mmHg 98.7 F (37.1 C) Oral 116 18 97 %     Physical Exam  1745: Physical examination:  Nursing notes reviewed; Vital signs and O2 SAT reviewed;  Constitutional: Well developed, Well nourished, Well hydrated, In no acute distress; Head:  Normocephalic, atraumatic; Eyes: EOMI, PERRL, No scleral icterus; ENMT: Mouth and pharynx normal, Mucous membranes moist; Neck: Supple, Full range of motion, No lymphadenopathy; Cardiovascular: Tachycardic rate and rhythm, No gallop; Respiratory: Breath sounds clear & equal bilaterally, No wheezes.  Speaking full sentences with ease, Normal respiratory effort/excursion; Chest: Nontender, Movement normal; Abdomen: Soft, Nontender, Nondistended, Normal bowel sounds; Genitourinary: No CVA tenderness; Extremities: Pulses normal, No tenderness, +tr pedal edema bilat. No calf edema or asymmetry.; Neuro: AA&Ox3, rambling historian. Major CN grossly intact. No facial droop. Speech clear. No gross  focal motor or sensory deficits in extremities.; Skin: Color normal, Warm, Dry.   ED Course  Procedures     EKG Interpretation None      MDM  MDM Reviewed: previous chart, nursing note and vitals Reviewed previous: labs and ECG Interpretation: labs, ECG and x-ray      Results for orders placed or performed during the hospital encounter of 10/16/14  Urinalysis, Routine w reflex microscopic-may I&O cath if menses ([facility])  Result Value Ref Range   Color, Urine YELLOW YELLOW   APPearance CLEAR CLEAR   Specific Gravity, Urine 1.013 1.005 - 1.030   pH 6.5 5.0 - 8.0   Glucose, UA NEGATIVE NEGATIVE mg/dL   Hgb urine dipstick NEGATIVE NEGATIVE   Bilirubin Urine NEGATIVE NEGATIVE   Ketones, ur 15 (A) NEGATIVE mg/dL   Protein, ur NEGATIVE NEGATIVE mg/dL   Urobilinogen, UA  0.2 0.0 - 1.0 mg/dL   Nitrite NEGATIVE NEGATIVE   Leukocytes, UA NEGATIVE NEGATIVE  CBC  Result Value Ref Range   WBC 11.2 (H) 4.0 - 10.5 K/uL   RBC 3.91 3.87 - 5.11 MIL/uL   Hemoglobin 10.9 (L) 12.0 - 15.0 g/dL   HCT 35.2 (L) 36.0 - 46.0 %   MCV 90.0 78.0 - 100.0 fL   MCH 27.9 26.0 - 34.0 pg   MCHC 31.0 30.0 - 36.0 g/dL   RDW 16.5 (H) 11.5 - 15.5 %   Platelets 377 150 - 400 K/uL  Basic metabolic panel  Result Value Ref Range   Sodium 135 135 - 145 mmol/L   Potassium 4.5 3.5 - 5.1 mmol/L   Chloride 101 101 - 111 mmol/L   CO2 24 22 - 32 mmol/L   Glucose, Bld 174 (H) 65 - 99 mg/dL   BUN 21 (H) 6 - 20 mg/dL   Creatinine, Ser 1.52 (H) 0.44 - 1.00 mg/dL   Calcium 9.8 8.9 - 10.3 mg/dL   GFR calc non Af Amer 29 (L) >60 mL/min   GFR calc Af Amer 34 (L) >60 mL/min   Anion gap 10 5 - 15  Brain natriuretic peptide  Result Value Ref Range   B Natriuretic Peptide 114.2 (H) 0.0 - 100.0 pg/mL  I-stat troponin, ED  Result Value Ref Range   Troponin i, poc 0.00 0.00 - 0.08 ng/mL   Comment 3          I-Stat CG4 Lactic Acid, ED  Result Value Ref Range   Lactic Acid, Venous 1.67 0.5 - 2.0 mmol/L   Dg  Chest 2 View 10/16/2014   CLINICAL DATA:  Urinary tract infection.  Taking antibiotics.  EXAM: CHEST  2 VIEW  COMPARISON:  02/02/2010  FINDINGS: The heart size and mediastinal contours are within normal limits. Pulmonary vascular congestion noted. Aortic atherosclerosis noted. Both lungs are clear. The visualized skeletal structures are unremarkable.  IMPRESSION: 1. Pulmonary vascular congestion 2. Aortic atherosclerosis.   Electronically Signed   By: Kerby Moors M.D.   On: 10/16/2014 18:37    2000:  Pt hypoxic to 88% R/A on stretcher; though ambulated with Sats remaining 94% R/A. Remains tachycardic. +rectal temp to 102.1; APAP given; UC and BC obtained. No clear source for fever at this time: pt denies any further complaints. BUN/Cr elevated, no old to compare. Will dose judicious IVF given pulm vasc congestion on CXR. Dx and testing d/w pt and family.  Questions answered.  Verb understanding, agreeable to admit. T/C to [facility]. Alcario Drought, case discussed, including:  HPI, pertinent PM/SHx, VS/PE, dx testing, ED course and treatment:  Agreeable to admit, requests to write temporary orders, obtain medical bed to team MCAdmits.    Francine Graven, DO 10/19/14 2153

## 2014-10-16 NOTE — ED Notes (Signed)
Called patient to move to room. Unable to locate patient at this time. 

## 2014-10-16 NOTE — ED Notes (Signed)
Pt ambulated down the hallway and back independently, Sat stayed above 94% on RA and HR 115-120

## 2014-10-17 DIAGNOSIS — N289 Disorder of kidney and ureter, unspecified: Secondary | ICD-10-CM

## 2014-10-17 DIAGNOSIS — R509 Fever, unspecified: Secondary | ICD-10-CM | POA: Diagnosis not present

## 2014-10-17 DIAGNOSIS — A09 Infectious gastroenteritis and colitis, unspecified: Secondary | ICD-10-CM | POA: Diagnosis not present

## 2014-10-17 DIAGNOSIS — N179 Acute kidney failure, unspecified: Secondary | ICD-10-CM | POA: Diagnosis not present

## 2014-10-17 LAB — CBC
HCT: 29.7 % — ABNORMAL LOW (ref 36.0–46.0)
HEMOGLOBIN: 9.3 g/dL — AB (ref 12.0–15.0)
MCH: 27.8 pg (ref 26.0–34.0)
MCHC: 31.3 g/dL (ref 30.0–36.0)
MCV: 88.7 fL (ref 78.0–100.0)
Platelets: 341 10*3/uL (ref 150–400)
RBC: 3.35 MIL/uL — AB (ref 3.87–5.11)
RDW: 16.6 % — ABNORMAL HIGH (ref 11.5–15.5)
WBC: 9.7 10*3/uL (ref 4.0–10.5)

## 2014-10-17 LAB — BASIC METABOLIC PANEL
Anion gap: 10 (ref 5–15)
BUN: 20 mg/dL (ref 6–20)
CO2: 24 mmol/L (ref 22–32)
CREATININE: 1.25 mg/dL — AB (ref 0.44–1.00)
Calcium: 8.9 mg/dL (ref 8.9–10.3)
Chloride: 101 mmol/L (ref 101–111)
GFR calc Af Amer: 43 mL/min — ABNORMAL LOW (ref >60)
GFR, EST NON AFRICAN AMERICAN: 37 mL/min — AB (ref >60)
Glucose, Bld: 130 mg/dL — ABNORMAL HIGH (ref 65–99)
Potassium: 3.5 mmol/L (ref 3.5–5.1)
SODIUM: 135 mmol/L (ref 135–145)

## 2014-10-17 LAB — URINE CULTURE: Culture: NO GROWTH

## 2014-10-17 MED ORDER — ENSURE ENLIVE PO LIQD
237.0000 mL | Freq: Two times a day (BID) | ORAL | Status: DC
  Administered 2014-10-17 (×2): 237 mL via ORAL

## 2014-10-17 NOTE — Progress Notes (Signed)
Pt arrived to unit alert and oriented x4. Oriented to room, unit, and staff.  Bed in lowest position and call bell is within reach. Will continue to monitor. 

## 2014-10-17 NOTE — Progress Notes (Signed)
[facility] PROGRESS NOTE  Tiffany [name] FUX:[id number] DOB: 10-12-1924 DOA: 10/16/2014 PCP: Kandice Hams, MD  Assessment/Plan: Diarrhea -Seems to have been self-limiting, has been afebrile since admission when she had her initial temperature of 102. -Has had no further episodes of diarrhea in the hospital. -We'll discontinue Cipro and Flagyl as have no current indication that this is bacterial in origin. -If no further diarrhea overnight, consider discharge home in a.m. -GI pathogen panel is pending.  Acute renal failure -Creatinine on admission was 1.52, down to 1.25 today with IV fluids. -Unsure if she has a degree of chronic kidney disease as they have no other creatinines for her in the system.  Code Status: Full code Family Communication: Patient only  Disposition Plan: Likely home in 24 hours   Consultants:  None   Antibiotics:  None   Subjective: No further diarrhea or abdominal pain, no nausea or vomiting.  Objective: Filed Vitals:   10/16/14 2223 10/16/14 2239 10/16/14 2241 10/17/14 0533  BP:   129/52 107/51  Pulse:   80 67  Temp: 98.2 F (36.8 C)  97.9 F (36.6 C) 97.5 F (36.4 C)  TempSrc: Oral  Oral Oral  Resp:   22 16  Height:  5' 1.2" (1.554 m)    Weight:  64.2 kg (141 lb 8.6 oz)    SpO2:   95% 95%    Intake/Output Summary (Last 24 hours) at 10/17/14 1350 Last data filed at 10/17/14 1048  Gross per 24 hour  Intake 1682.67 ml  Output    650 ml  Net 1032.67 ml   Filed Weights   10/16/14 2239  Weight: 64.2 kg (141 lb 8.6 oz)    Exam:   General:  Alert, awake, oriented 3, pleasant and cooperative, no distress  Cardiovascular: Regular rate and rhythm, no murmurs, rubs or gallops  Respiratory: Clear to auscultation bilaterally  Abdomen: Soft, nontender, nondistended, positive bowel sounds  Extremities: No clubbing, cyanosis or edema, positive pulses   Neurologic:  Grossly intact and nonfocal  Data Reviewed: Basic  Metabolic Panel:  Recent Labs Lab 10/16/14 1611 10/17/14 0353  NA 135 135  K 4.5 3.5  CL 101 101  CO2 24 24  GLUCOSE 174* 130*  BUN 21* 20  CREATININE 1.52* 1.25*  CALCIUM 9.8 8.9   Liver Function Tests: No results for input(s): AST, ALT, ALKPHOS, BILITOT, PROT, ALBUMIN in the last 168 hours. No results for input(s): LIPASE, AMYLASE in the last 168 hours. No results for input(s): AMMONIA in the last 168 hours. CBC:  Recent Labs Lab 10/16/14 1611 10/17/14 0353  WBC 11.2* 9.7  HGB 10.9* 9.3*  HCT 35.2* 29.7*  MCV 90.0 88.7  PLT 377 341   Cardiac Enzymes: No results for input(s): CKTOTAL, CKMB, CKMBINDEX, TROPONINI in the last 168 hours. BNP (last 3 results)  Recent Labs  10/16/14 1841  BNP 114.2*    ProBNP (last 3 results) No results for input(s): PROBNP in the last 8760 hours.  CBG: No results for input(s): GLUCAP in the last 168 hours.  Recent Results (from the past 240 hour(s))  Urine C&S     Status: None   Collection Time: 10/16/14  3:15 PM  Result Value Ref Range Status   Specimen Description URINE, CLEAN CATCH  Final   Special Requests NONE  Final   Culture NO GROWTH 1 DAY  Final   Report Status 10/17/2014 FINAL  Final  Blood culture (routine x 2)     Status:  None (Preliminary result)   Collection Time: 10/16/14  8:00 PM  Result Value Ref Range Status   Specimen Description BLOOD RIGHT ARM  Final   Special Requests BOTTLES DRAWN AEROBIC AND ANAEROBIC 5CC  Final   Culture NO GROWTH < 24 HOURS  Final   Report Status PENDING  Incomplete  Blood culture (routine x 2)     Status: None (Preliminary result)   Collection Time: 10/16/14  8:08 PM  Result Value Ref Range Status   Specimen Description BLOOD RIGHT HAND  Final   Special Requests BOTTLES DRAWN AEROBIC AND ANAEROBIC 5CC  Final   Culture NO GROWTH < 24 HOURS  Final   Report Status PENDING  Incomplete     Studies: Dg Chest 2 View  10/16/2014   CLINICAL DATA:  Urinary tract infection.  Taking  antibiotics.  EXAM: CHEST  2 VIEW  COMPARISON:  02/02/2010  FINDINGS: The heart size and mediastinal contours are within normal limits. Pulmonary vascular congestion noted. Aortic atherosclerosis noted. Both lungs are clear. The visualized skeletal structures are unremarkable.  IMPRESSION: 1. Pulmonary vascular congestion 2. Aortic atherosclerosis.   Electronically Signed   By: Kerby Moors M.D.   On: 10/16/2014 18:37    Scheduled Meds: . acetaminophen  650 mg Oral QID  . amLODipine  5 mg Oral QAC breakfast  . calcium-vitamin D  1 tablet Oral Daily  . estradiol  0.5 mg Oral Daily  . feeding supplement (ENSURE ENLIVE)  237 mL Oral BID BM  . heparin  5,000 Units Subcutaneous 3 times per day  . metronidazole  500 mg Intravenous Q8H   Continuous Infusions: . sodium chloride    . sodium chloride 100 mL/hr at 10/17/14 1140    Principal Problem:   Diarrhea, infectious, adult Active Problems:   Fever   Renal insufficiency    Time spent: 30 minutes. Greater than 50% of this time was spent in direct contact with the patient coordinating care.    Tiffany [name]  [facility] Pager [phone]  If 7PM-7AM, please contact night-coverage at [email or web address], password [facility] 10/17/2014, 1:50 PM

## 2014-10-18 DIAGNOSIS — N179 Acute kidney failure, unspecified: Secondary | ICD-10-CM | POA: Diagnosis not present

## 2014-10-18 DIAGNOSIS — R509 Fever, unspecified: Secondary | ICD-10-CM | POA: Diagnosis not present

## 2014-10-18 DIAGNOSIS — A09 Infectious gastroenteritis and colitis, unspecified: Secondary | ICD-10-CM | POA: Diagnosis not present

## 2014-10-18 NOTE — Discharge Summary (Signed)
Physician Discharge Summary  Tiffany [name] GGY:[id number] DOB: 06-Mar-1925 DOA: 10/16/2014  PCP: Kandice Hams, MD  Admit date: 10/16/2014 Discharge date: 10/18/2014  Time spent: 45 minutes  Recommendations for Outpatient Follow-up:  -Will be discharged home today. -Advised to follow up with PCP in 1 week for BMET to follow on renal function.   Discharge Diagnoses:  Principal Problem:   Diarrhea, infectious, adult Active Problems:   Fever   ARF (acute renal failure)   Discharge Condition: Stable and improved  Filed Weights   10/16/14 2239  Weight: 64.2 kg (141 lb 8.6 oz)    History of present illness:  Tiffany [name] is a 81 y.o. female who presents to the ED with c/o 1 day history of fever. She has been having dysuria for the past 2-3 months, numerous courses of ABx have not improved this symptom. Today she has had multiple episodes of diarrhea which are new, and also has developed "chills".  UA was negative in the ED, Tm in ED was 102.1.  Hospital Course:   Diarrhea -Was self-limiting, has had no diarrhea while in the hospital, remains afebrile. -Likely self-limiting acute viral gastroenteritis. -GI stool pathogen panel has not been collected as she has not had diarrhea.  Acute renal failure -Cr on admission was 1.52, down to 1.25 with IV fluids. -Unsure if she has a degree of chronic kidney disease as I have no other renal function for her in the system.  -Have advised to drink copious amounts of oral fluids and follow-up with her primary care physician in one week at which time a basic metabolic panel should be drawn to follow renal function.  Procedures:  None    Consultations:  None  Discharge Instructions  Discharge Instructions    Increase activity slowly    Complete by:  As directed             Medication List    STOP taking these medications        hydrochlorothiazide 25 MG tablet  Commonly known as:  HYDRODIURIL     nitrofurantoin 100  MG capsule  Commonly known as:  MACRODANTIN     potassium chloride SA 20 MEQ tablet  Commonly known as:  K-DUR,KLOR-CON      TAKE these medications        acetaminophen 650 MG CR tablet  Commonly known as:  TYLENOL  Take 1,300 mg by mouth 2 (two) times daily.     amLODipine 5 MG tablet  Commonly known as:  NORVASC  Take 5 mg by mouth daily before breakfast.     calcium-vitamin D 500-200 MG-UNIT per tablet  Commonly known as:  OSCAL WITH D  Take 1 tablet by mouth daily.     colchicine 0.6 MG tablet  Take 0.6 mg by mouth as needed (FOR GOUT FLAREUP).     estradiol 0.5 MG tablet  Commonly known as:  ESTRACE  Take 0.5 mg by mouth daily.     Fish Oil 300 MG Caps  Take 1 capsule by mouth every evening.     phenazopyridine 95 MG tablet  Commonly known as:  PYRIDIUM  Take 95 mg by mouth 3 (three) times daily as needed for pain.       Allergies  Allergen Reactions  . Indomethacin Swelling and Other (See Comments)    TONGUE SWOLLEN  . Lisinopril Swelling and Other (See Comments)    Tongue swelling       Follow-up Information  Follow up with Kandice Hams, MD. Schedule an appointment as soon as possible for a visit in 1 week.   Specialty:  Internal Medicine   Contact information:   [address] [place] [address] [phone]        The results of significant diagnostics from this hospitalization (including imaging, microbiology, ancillary and laboratory) are listed below for reference.    Significant Diagnostic Studies: Dg Chest 2 View  10/16/2014   CLINICAL DATA:  Urinary tract infection.  Taking antibiotics.  EXAM: CHEST  2 VIEW  COMPARISON:  02/02/2010  FINDINGS: The heart size and mediastinal contours are within normal limits. Pulmonary vascular congestion noted. Aortic atherosclerosis noted. Both lungs are clear. The visualized skeletal structures are unremarkable.  IMPRESSION: 1. Pulmonary vascular congestion 2. Aortic atherosclerosis.    Electronically Signed   By: Kerby Moors M.D.   On: 10/16/2014 18:37    Microbiology: Recent Results (from the past 240 hour(s))  Urine C&S     Status: None   Collection Time: 10/16/14  3:15 PM  Result Value Ref Range Status   Specimen Description URINE, CLEAN CATCH  Final   Special Requests NONE  Final   Culture NO GROWTH 1 DAY  Final   Report Status 10/17/2014 FINAL  Final  Blood culture (routine x 2)     Status: None (Preliminary result)   Collection Time: 10/16/14  8:00 PM  Result Value Ref Range Status   Specimen Description BLOOD RIGHT ARM  Final   Special Requests BOTTLES DRAWN AEROBIC AND ANAEROBIC 5CC  Final   Culture NO GROWTH < 24 HOURS  Final   Report Status PENDING  Incomplete  Blood culture (routine x 2)     Status: None (Preliminary result)   Collection Time: 10/16/14  8:08 PM  Result Value Ref Range Status   Specimen Description BLOOD RIGHT HAND  Final   Special Requests BOTTLES DRAWN AEROBIC AND ANAEROBIC 5CC  Final   Culture NO GROWTH < 24 HOURS  Final   Report Status PENDING  Incomplete     Labs: Basic Metabolic Panel:  Recent Labs Lab 10/16/14 1611 10/17/14 0353  NA 135 135  K 4.5 3.5  CL 101 101  CO2 24 24  GLUCOSE 174* 130*  BUN 21* 20  CREATININE 1.52* 1.25*  CALCIUM 9.8 8.9   Liver Function Tests: No results for input(s): AST, ALT, ALKPHOS, BILITOT, PROT, ALBUMIN in the last 168 hours. No results for input(s): LIPASE, AMYLASE in the last 168 hours. No results for input(s): AMMONIA in the last 168 hours. CBC:  Recent Labs Lab 10/16/14 1611 10/17/14 0353  WBC 11.2* 9.7  HGB 10.9* 9.3*  HCT 35.2* 29.7*  MCV 90.0 88.7  PLT 377 341   Cardiac Enzymes: No results for input(s): CKTOTAL, CKMB, CKMBINDEX, TROPONINI in the last 168 hours. BNP: BNP (last 3 results)  Recent Labs  10/16/14 1841  BNP 114.2*    ProBNP (last 3 results) No results for input(s): PROBNP in the last 8760 hours.  CBG: No results for input(s): GLUCAP in  the last 168 hours.     SignedLelon Frohlich  [facility] Pager: [phone] 10/18/2014, 9:53 AM

## 2014-10-18 NOTE — Care Management Note (Signed)
Case Management Note  Patient Details  Name: JOCHEBED BILLS MRN: [id number] Date of Birth: 12-26-1924  Subjective/Objective:    Patient dc to home, no needs.                 Action/Plan:   Expected Discharge Date:                  Expected Discharge Plan:  Home/Self Care  In-House Referral:     Discharge planning Services  CM Consult  Post Acute Care Choice:    Choice offered to:     DME Arranged:    DME Agency:     HH Arranged:    [place] Agency:     Status of Service:  Completed, signed off  Medicare Important Message Given:    Date Medicare IM Given:    Medicare IM give by:    Date Additional Medicare IM Given:    Additional Medicare Important Message give by:     If discussed at [place] of Stay Meetings, dates discussed:    Additional Comments:  Zenon Mayo, RN 10/18/2014, 11:13 AM

## 2014-10-18 NOTE — Progress Notes (Signed)
NURSING PROGRESS NOTE  Tiffany [name] [id number] Discharge Data: 10/18/2014 1048AM Attending Provider: Koleen Nimrod Acost* [id number] D, MD   Randol Kern to be D/C'd Home per MD order via wheelchair by [employer] [facility] student, with husband. Verified with MD Deniece Ree via phone, no stool sample needs to be collected and prescriptions need to be sent home with patient.    All IV's will be discontinued and monitored for bleeding.  All belongings will be returned to patient for patient to take home.  Last Documented Vital Signs:  Blood pressure 134/58, pulse 70, temperature 98.4 F (36.9 C), temperature source Oral, resp. rate 17, height 5' 1.2" (1.554 m), weight 64.2 kg (141 lb 8.6 oz), SpO2 97 %.  Hendricks Limes RN, BS, BSN

## 2014-10-21 LAB — CULTURE, BLOOD (ROUTINE X 2)
CULTURE: NO GROWTH
Culture: NO GROWTH

## 2014-11-15 ENCOUNTER — Telehealth: Payer: Self-pay | Admitting: Hematology

## 2014-11-15 NOTE — Telephone Encounter (Signed)
New patient appt-s/w patient and gave np appt for 08/16 @ 12:30 w/Dr. Irene Limbo. Referring Dr. [name] Dx-Mets Disease   Referral information scanned

## 2014-11-16 ENCOUNTER — Other Ambulatory Visit: Payer: Medicare Other

## 2014-11-16 ENCOUNTER — Telehealth: Payer: Self-pay | Admitting: Hematology

## 2014-11-16 ENCOUNTER — Ambulatory Visit: Payer: Medicare Other

## 2014-11-16 ENCOUNTER — Encounter: Payer: Self-pay | Admitting: Hematology

## 2014-11-16 ENCOUNTER — Other Ambulatory Visit (HOSPITAL_COMMUNITY)
Admission: RE | Admit: 2014-11-16 | Discharge: 2014-11-16 | Disposition: A | Discharge status: Home / Self Care | Payer: Medicare Other | Source: Ambulatory Visit | Attending: Hematology | Admitting: Hematology

## 2014-11-16 ENCOUNTER — Ambulatory Visit (HOSPITAL_BASED_OUTPATIENT_CLINIC_OR_DEPARTMENT_OTHER): Payer: Medicare Other | Admitting: Hematology

## 2014-11-16 ENCOUNTER — Ambulatory Visit (HOSPITAL_BASED_OUTPATIENT_CLINIC_OR_DEPARTMENT_OTHER): Payer: Medicare Other

## 2014-11-16 VITALS — BP 142/62 | HR 95 | Temp 98.0°F | Resp 17 | Ht 61.2 in | Wt 138.5 lb

## 2014-11-16 DIAGNOSIS — R1900 Intra-abdominal and pelvic swelling, mass and lump, unspecified site: Secondary | ICD-10-CM | POA: Diagnosis present

## 2014-11-16 DIAGNOSIS — D638 Anemia in other chronic diseases classified elsewhere: Secondary | ICD-10-CM

## 2014-11-16 DIAGNOSIS — C787 Secondary malignant neoplasm of liver and intrahepatic bile duct: Secondary | ICD-10-CM

## 2014-11-16 DIAGNOSIS — N289 Disorder of kidney and ureter, unspecified: Secondary | ICD-10-CM

## 2014-11-16 DIAGNOSIS — R978 Other abnormal tumor markers: Secondary | ICD-10-CM

## 2014-11-16 DIAGNOSIS — R918 Other nonspecific abnormal finding of lung field: Secondary | ICD-10-CM

## 2014-11-16 DIAGNOSIS — D649 Anemia, unspecified: Secondary | ICD-10-CM

## 2014-11-16 DIAGNOSIS — R300 Dysuria: Secondary | ICD-10-CM

## 2014-11-16 DIAGNOSIS — R932 Abnormal findings on diagnostic imaging of liver and biliary tract: Secondary | ICD-10-CM

## 2014-11-16 DIAGNOSIS — R590 Localized enlarged lymph nodes: Secondary | ICD-10-CM | POA: Diagnosis not present

## 2014-11-16 DIAGNOSIS — N2889 Other specified disorders of kidney and ureter: Secondary | ICD-10-CM

## 2014-11-16 LAB — URINALYSIS, MICROSCOPIC - CHCC
Bilirubin (Urine): NEGATIVE
Glucose: NEGATIVE mg/dL
KETONES: NEGATIVE mg/dL
Nitrite: NEGATIVE
Protein: <30 mg/dL
SPECIFIC GRAVITY, URINE: 1.015 (ref 1.003–1.035)
Urobilinogen, UR: 0.2 mg/dL (ref 0.2–1)
pH: 6.5 (ref 4.6–8.0)

## 2014-11-16 LAB — COMPREHENSIVE METABOLIC PANEL (CC13)
ALBUMIN: 3.2 g/dL — AB (ref 3.5–5.0)
ALK PHOS: 86 U/L (ref 40–150)
ALT: 13 U/L (ref 0–55)
AST: 21 U/L (ref 5–34)
Anion Gap: 10 mEq/L (ref 3–11)
BUN: 19.2 mg/dL (ref 7.0–26.0)
CALCIUM: 10.4 mg/dL (ref 8.4–10.4)
CHLORIDE: 105 meq/L (ref 98–109)
CO2: 22 mEq/L (ref 22–29)
Creatinine: 1.4 mg/dL — ABNORMAL HIGH (ref 0.6–1.1)
EGFR: 35 mL/min/{1.73_m2} — AB (ref >90)
GLUCOSE: 92 mg/dL (ref 70–140)
POTASSIUM: 4.5 meq/L (ref 3.5–5.1)
SODIUM: 136 meq/L (ref 136–145)
Total Bilirubin: 0.36 mg/dL (ref 0.20–1.20)
Total Protein: 8.2 g/dL (ref 6.4–8.3)

## 2014-11-16 LAB — CBC & DIFF AND RETIC
BASO%: 0.3 % (ref 0.0–2.0)
Basophils Absolute: 0 10*3/uL (ref 0.0–0.1)
EOS ABS: 0.1 10*3/uL (ref 0.0–0.5)
EOS%: 0.7 % (ref 0.0–7.0)
HCT: 31.8 % — ABNORMAL LOW (ref 34.8–46.6)
HEMOGLOBIN: 10 g/dL — AB (ref 11.6–15.9)
Immature Retic Fract: 15.4 % — ABNORMAL HIGH (ref 1.60–10.00)
LYMPH%: 28.7 % (ref 14.0–49.7)
MCH: 27.5 pg (ref 25.1–34.0)
MCHC: 31.4 g/dL — AB (ref 31.5–36.0)
MCV: 87.6 fL (ref 79.5–101.0)
MONO#: 1 10*3/uL — ABNORMAL HIGH (ref 0.1–0.9)
MONO%: 14.9 % — AB (ref 0.0–14.0)
NEUT%: 55.4 % (ref 38.4–76.8)
NEUTROS ABS: 3.9 10*3/uL (ref 1.5–6.5)
Platelets: 431 10*3/uL — ABNORMAL HIGH (ref 145–400)
RBC: 3.63 10*6/uL — ABNORMAL LOW (ref 3.70–5.45)
RDW: 17.8 % — AB (ref 11.2–14.5)
RETIC %: 1.66 % (ref 0.70–2.10)
Retic Ct Abs: 60.26 10*3/uL (ref 33.70–90.70)
WBC: 7 10*3/uL (ref 3.9–10.3)
lymph#: 2 10*3/uL (ref 0.9–3.3)

## 2014-11-16 LAB — LACTATE DEHYDROGENASE (CC13): LDH: 314 U/L — ABNORMAL HIGH (ref 125–245)

## 2014-11-16 MED ORDER — LIDOCAINE HCL 2 % EX GEL: 1.0000 "application " | Freq: Four times a day (QID) | CUTANEOUS | Status: DC | PRN

## 2014-11-16 NOTE — Telephone Encounter (Signed)
per pof to sch pt appt-gave pt copy of avs °

## 2014-11-16 NOTE — Progress Notes (Signed)
Hematology oncology CONSULT NOTE  Date of service 11/16/2014  Patient Care Team: [name], MD as PCP - General (Internal Medicine)   Urologist:Dr. Bjorn Loser [facility])  CHIEF COMPLAINTS/PURPOSE OF CONSULTATION: Evaluation and management of suspected metastatic malignancy.   HISTORY OF PRESENTING ILLNESS:  Tiffany [name]  is a fantastic 81 year old Caucasian female in excellent health for her age who has been referred to [place] by her PCP Dr. [name] for evaluation and management of what appears to be a metastatic malignancy on CT abdomen done at an outside facility.  She has a history of degenerative joint disease, cervical degenerative disc disease, peptic ulcer disease, GERD with benign esophageal stricture who was apparently in her usual state of health until May 2016 when she noted discomfort with urination [dysuria], frequency and was evaluated for this. She was thought to have urinary tract infection without a clear positive urine culture. She was treated with ciprofloxacin. She notes that her symptoms persisted and she was then treated with nitrofurantoin which did not help relieve her symptoms. The nitrofurantoin fine apparently caused significant tremulousness and has been listed as an allergy. Her symptoms apparently continued and she was referred to urologist for further evaluation. She apparently had a cystoscopy on 09/27/2014 that as per urology notes showed mild-to-moderate erythema in the posterior wall of the bladder with white flecks in her urine. The urine was cloudy but no overt infection was noted.   She was reevaluated by Dr. Matilde Sprang at [facility] urology on 11/09/2014 for chronic cystitis. She had a CT of the abdomen without contrast on 11/19/2014 for further evaluation of her symptoms which showed a possible right kidney pelvis mass, retroperitoneal lymphadenopathy, lung nodules and widespread liver lesions concerning for metastatic  malignancy possibly RCC versus TCC.  She was sent to me for further evaluation seen on 11/16/2014. She has been on trimethoprim for chronic management of chronic cystitis and notes that her symptoms are somewhat better. She notes that she was briefly on phenazopyridine which was not very helpful. She was still having some dysuria.  The urinalysis only urine culture and urine cytology were sent out and the patient was set up for a PET/CT scan and a CT-guided biopsy of one of her liver lesions to make a definitive diagnosis.  The urine cytology came back as being concerning for transitional cell carcinoma. No overt evidence of urinary infection at this time.  PET/CT scan and liver mass biopsy are currently pending.  Off note patient has been on estrogen replacement therapy since age 40 years and continues to be.  She has hematochezia 2013 and had a colonoscopy with biopsies that showed ulcerative proctitis of unclear etiology.  She has been a smoker in the past but has quit for a long time.  No change in bowel habits, shortness of breath, chest pain, overt hematuria, abdominal pain.  Notes about a 10-15 pound weight loss in the last few months.  Was here for the clinic visit along with her husband.   MEDICAL HISTORY:  Past Medical History  Diagnosis Date  . Hypertension   . Edema leg     feet and ankles  . Headache(784.0)   . Esophagus disorder     Had esophagus stretched  . Arthritis      knees 03-10-12 had Cortisone injection  . Occipital neuralgia   . Ulcerative proctitis   . DDD (degenerative disc disease), cervical   . GERD (gastroesophageal reflux disease)     Benign  stricture dilated in 2011  . Occipital neuralgia     Related to cervical degenerative disc disease  . Gout     Patient notes she has possible gout but has not been on chronic medications for this  . Peptic ulcer disease     Previous history in the remote past  . Ulcerative proctitis 2014    SURGICAL  HISTORY: Past Surgical History  Procedure Laterality Date  . Cholecystectomy    . Appendectomy    . Abdominal hysterectomy    . Dilation and curettage of uterus    . Neck surgery      20 years ago  . Esophagogastroduodenoscopy    . Colonoscopy with propofol  03/25/2012    Procedure: COLONOSCOPY WITH PROPOFOL;  Surgeon: Garlan Fair, MD;  Location: [facility];  Service: Endoscopy;  Laterality: N/A;  . Flexible sigmoidoscopy N/A 04/26/2014    Procedure: FLEXIBLE SIGMOIDOSCOPY - UnSedated;  Surgeon: Garlan Fair, MD;  Location: [facility];  Service: Endoscopy;  Laterality: N/A;  . Esophageal dilation      For benign stricture in 2011  . Total abdominal hysterectomy w/ bilateral salpingoophorectomy      At age 17 due to miscarriage and retained products of conception causing significant uterine bleeding. Patient has been on estrogen replacement therapy since then.    SOCIAL HISTORY: Social History   Social History  . Marital Status: Married    Spouse Name: N/A  . Number of Children: N/A  . Years of Education: N/A   Occupational History  . Not on file.   Social History Main Topics  . Smoking status: Former Smoker    Quit date: 11/01/1983  . Smokeless tobacco: Never Used  . Alcohol Use: Yes  . Drug Use: No  . Sexual Activity: Not on file   Other Topics Concern  . Not on file   Social History Narrative  Retired as Conservation officer, nature for [employer]. She is very well spoken and intelligent individual and exhibits a keen knowledge of her medical conditions.  FAMILY HISTORY: Family History  Problem Relation Age of Onset  . Lung cancer Sister   . Prostate cancer Brother   . Uterine cancer Maternal Aunt   . Breast cancer Paternal Grandmother   . Hodgkin's lymphoma Sister     ALLERGIES:  is allergic to indomethacin and lisinopril.  MEDICATIONS:  Current Outpatient Prescriptions  Medication Sig Dispense Refill  . acetaminophen (TYLENOL) 650 MG CR  tablet Take 1,300 mg by mouth 2 (two) times daily.    Marland Kitchen amLODipine (NORVASC) 5 MG tablet Take 5 mg by mouth daily before breakfast.    . calcium-vitamin D (OSCAL WITH D) 500-200 MG-UNIT per tablet Take 1 tablet by mouth daily.    . colchicine 0.6 MG tablet Take 0.6 mg by mouth as needed (FOR GOUT FLAREUP).    Marland Kitchen estradiol (ESTRACE) 0.5 MG tablet Take 0.5 mg by mouth daily.  1  . lidocaine (XYLOCAINE) 2 % jelly Place 1 application into the urethra 4 (four) times daily as needed. For urethral pain 30 mL 1  . Omega-3 Fatty Acids (FISH OIL) 300 MG CAPS Take 1 capsule by mouth every evening.     No current facility-administered medications for this visit.    REVIEW OF SYSTEMS:   Constitutional: Denies fevers, chills or abnormal night sweats Eyes: Denies blurriness of vision, double vision or watery eyes Ears, nose, mouth, throat, and face: Denies mucositis or sore throat Respiratory: Denies cough, dyspnea  or wheezes Cardiovascular: Denies palpitation, chest discomfort or lower extremity swelling Gastrointestinal:  Denies nausea, heartburn or change in bowel habits Skin: Denies abnormal skin rashes Lymphatics: Denies new lymphadenopathy or easy bruising Neurological:Denies numbness, tingling or new weaknesses Behavioral/Psych: Mood is stable, no new changes  All other systems were reviewed with the patient and are negative.   PHYSICAL EXAMINATION: ECOG PERFORMANCE STATUS: 1  Filed Vitals:   11/16/14 1309  BP: 142/62  Pulse: 95  Temp: 98 F (36.7 C)  Resp: 17   Filed Weights   11/16/14 1309  Weight: 138 lb 8 oz (62.823 kg)   GENERAL elderly Caucasian female, alert, no distress and comfortable SKIN: skin color, texture, turgor are normal, no rashes or significant lesions EYES: normal, conjunctiva are pink and non-injected, sclera clear OROPHARYNX:no exudate, no erythema and lips, buccal mucosa, and tongue normal  NECK: supple, thyroid normal size, non-tender, without  nodularity LYMPH:  no palpable lymphadenopathy in the cervical, axillary or inguinal LUNGS: clear to auscultation and percussion with normal breathing effort HEART: regular rate & rhythm and no murmurs and no lower extremity edema ABDOMEN:abdomen soft, non-tender and normal bowel sounds Musculoskeletal:no cyanosis of digits and no clubbing  PSYCH: alert & oriented x 3 with fluent speech NEURO: no focal motor/sensory deficits  LABORATORY DATA:  I have reviewed the data as listed Lab Results  Component Value Date   WBC 7.0 11/16/2014   HGB 10.0* 11/16/2014   HCT 31.8* 11/16/2014   MCV 87.6 11/16/2014   PLT 431* 11/16/2014    Recent Labs  10/16/14 1611 10/17/14 0353 11/16/14 1502  NA 135 135 136  K 4.5 3.5 4.5  CL 101 101  --   CO2 24 24 22   GLUCOSE 174* 130* 92  BUN 21* 20 19.2  CREATININE 1.52* 1.25* 1.4*  CALCIUM 9.8 8.9 10.4  GFRNONAA 29* 37*  --   GFRAA 34* 43*  --   PROT  --   --  8.2  ALBUMIN  --   --  3.2*  AST  --   --  21  ALT  --   --  13  ALKPHOS  --   --  86  BILITOT  --   --  0.36    RADIOGRAPHIC STUDIES:  CT abdomen without contrast [11/09/2014] done at [facility] Findings     ASSESSMENT & PLAN:   81 year old Caucasian female with  #1 Right renal pelvis mass with retroperitoneal lymphadenopathy, multiple liver lesions and lung nodules concerning for metastatic malignancy. 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 Biopsy of one of her large liver lesions would be needed for confirmation of diagnosis and to rule out other possibilities.  If this is confirmed to be transitional cell carcinoma arising from the renal pelvis given that it is metastatic all treatments will be palliative at this time. Her creatinine to 1.5 and GFR around 30 would likely make the patient cisplatin ineligible. Plan -PET/CT scan to complete staging -ASAP CT-guided liver lesion biopsy to confirm  diagnosis. -Viscous lidocaine jelly given for urethral pain and discomfort. -If metastatic transitional cell carcinoma is confirmed would recommend treatment with Atezolizumab (anti-PDL 1 antibody) which shows remarkable responses and provides potential for long-term disease control as compared to other first-line chemotherapy options such as carboplatin + gemcitabine. -If this turns out to be something different would  recommend treatment accordingly -Given the high risk of venous thromboembolism would let the primary care physician consider  discontinuation of chronic estrogen replacement therapy ,unless makes the patient too uncomfortable. -Return to care in 4-5 days after biopsy to define final plan of care. -will follow-up some of the other tumor markers that have been sent out.  #2 Normocytic normochromic anemia likely due to anemia of chronic disease from her metastatic malignancy and chronic kidney disease. -No indication of blood transfusion at this time  -We will monitor and treat with when necessary blood transfusions if symptomatic.  #3 urethral discomfort Viscous lidocaine jelly prescribed for when necessary use. -Patient continues to be on chronic trimethoprim as per urology recommendations.  I appreciate the privilege of taking care of this wonderful patient .  All questions were answered. The patient knows to call the clinic with any problems, questions or concerns. I spent 60 minutes counseling the patient face to face. The total time spent in the appointment was 80 minutes and more than 50% was on counseling.   Sullivan Lone MD [place] [facility] Phys[facility]  (Office):       [phone] (Work cell):  [phone] (Fax):           [phone]

## 2014-11-16 NOTE — Progress Notes (Signed)
Checked in new pt with no financial concerns.  She has Shauna's card for any billing questions, concerns or if financial assistance is needed.

## 2014-11-17 LAB — URINE CULTURE

## 2014-11-17 LAB — CEA: CEA: 7.5 ng/mL — ABNORMAL HIGH (ref 0.0–5.0)

## 2014-11-17 LAB — GC/CHLAMYDIA PROBE AMP, URINE
CHLAMYDIA, SWAB/URINE, PCR: NEGATIVE
GC PROBE AMP, URINE: NEGATIVE

## 2014-11-17 LAB — AFP TUMOR MARKER: AFP TUMOR MARKER: 2.4 ng/mL (ref <6.1)

## 2014-11-17 LAB — CANCER ANTIGEN 19-9: CA 19 9: 80.1 U/mL — AB (ref <35.0)

## 2014-11-18 ENCOUNTER — Encounter: Payer: Self-pay | Admitting: Hematology

## 2014-11-18 ENCOUNTER — Other Ambulatory Visit: Payer: Self-pay | Admitting: *Deleted

## 2014-11-19 ENCOUNTER — Other Ambulatory Visit: Payer: Self-pay | Admitting: Physician Assistant

## 2014-11-22 ENCOUNTER — Ambulatory Visit (HOSPITAL_COMMUNITY)
Admission: RE | Admit: 2014-11-22 | Discharge: 2014-11-22 | Disposition: A | Discharge status: Home / Self Care | Payer: Medicare Other | Source: Ambulatory Visit | Attending: Hematology | Admitting: Hematology

## 2014-11-22 ENCOUNTER — Encounter (HOSPITAL_COMMUNITY): Payer: Self-pay

## 2014-11-22 ENCOUNTER — Other Ambulatory Visit: Payer: Self-pay | Admitting: Hematology

## 2014-11-22 DIAGNOSIS — C801 Malignant (primary) neoplasm, unspecified: Secondary | ICD-10-CM | POA: Insufficient documentation

## 2014-11-22 DIAGNOSIS — C659 Malignant neoplasm of unspecified renal pelvis: Secondary | ICD-10-CM | POA: Insufficient documentation

## 2014-11-22 DIAGNOSIS — C787 Secondary malignant neoplasm of liver and intrahepatic bile duct: Secondary | ICD-10-CM

## 2014-11-22 DIAGNOSIS — N2889 Other specified disorders of kidney and ureter: Secondary | ICD-10-CM | POA: Diagnosis not present

## 2014-11-22 DIAGNOSIS — Z79899 Other long term (current) drug therapy: Secondary | ICD-10-CM | POA: Insufficient documentation

## 2014-11-22 DIAGNOSIS — R3911 Hesitancy of micturition: Secondary | ICD-10-CM | POA: Insufficient documentation

## 2014-11-22 DIAGNOSIS — R350 Frequency of micturition: Secondary | ICD-10-CM | POA: Insufficient documentation

## 2014-11-22 DIAGNOSIS — R300 Dysuria: Secondary | ICD-10-CM | POA: Diagnosis not present

## 2014-11-22 DIAGNOSIS — K769 Liver disease, unspecified: Secondary | ICD-10-CM | POA: Diagnosis present

## 2014-11-22 DIAGNOSIS — R599 Enlarged lymph nodes, unspecified: Secondary | ICD-10-CM | POA: Diagnosis not present

## 2014-11-22 LAB — CBC
HEMATOCRIT: 31.1 % — AB (ref 36.0–46.0)
HEMOGLOBIN: 9.6 g/dL — AB (ref 12.0–15.0)
MCH: 27.1 pg (ref 26.0–34.0)
MCHC: 30.9 g/dL (ref 30.0–36.0)
MCV: 87.9 fL (ref 78.0–100.0)
Platelets: 431 10*3/uL — ABNORMAL HIGH (ref 150–400)
RBC: 3.54 MIL/uL — ABNORMAL LOW (ref 3.87–5.11)
RDW: 18 % — ABNORMAL HIGH (ref 11.5–15.5)
WBC: 6.3 10*3/uL (ref 4.0–10.5)

## 2014-11-22 LAB — APTT: aPTT: 35 seconds (ref 24–37)

## 2014-11-22 LAB — PROTIME-INR
INR: 1.22 (ref 0.00–1.49)
Prothrombin Time: 15.5 seconds — ABNORMAL HIGH (ref 11.6–15.2)

## 2014-11-22 MED ORDER — SODIUM CHLORIDE 0.9 % IV SOLN
INTRAVENOUS | Status: DC
  Administered 2014-11-22: 09:00:00 via INTRAVENOUS

## 2014-11-22 MED ORDER — MIDAZOLAM HCL 2 MG/2ML IJ SOLN
INTRAMUSCULAR | Status: AC
  Filled 2014-11-22: qty 2

## 2014-11-22 MED ORDER — LIDOCAINE HCL (PF) 1 % IJ SOLN
INTRAMUSCULAR | Status: AC
  Filled 2014-11-22: qty 10

## 2014-11-22 MED ORDER — HYDROCODONE-ACETAMINOPHEN 5-325 MG PO TABS: 1.0000 | ORAL_TABLET | ORAL | Status: DC | PRN

## 2014-11-22 MED ORDER — FENTANYL CITRATE (PF) 100 MCG/2ML IJ SOLN
INTRAMUSCULAR | Status: AC | PRN
  Administered 2014-11-22 (×2): 25 ug via INTRAVENOUS

## 2014-11-22 MED ORDER — FENTANYL CITRATE (PF) 100 MCG/2ML IJ SOLN
INTRAMUSCULAR | Status: AC
  Filled 2014-11-22: qty 2

## 2014-11-22 MED ORDER — MIDAZOLAM HCL 2 MG/2ML IJ SOLN
INTRAMUSCULAR | Status: AC | PRN
  Administered 2014-11-22 (×2): 0.5 mg via INTRAVENOUS

## 2014-11-22 NOTE — Procedures (Signed)
US core liver lesion 18g x3 to surg path No complication No blood loss. See complete dictation in [facility] PACS.  

## 2014-11-22 NOTE — Sedation Documentation (Signed)
Patient is resting comfortably. 

## 2014-11-22 NOTE — Discharge Instructions (Signed)
Liver Biopsy, Care After °These instructions give you information on caring for yourself after your procedure. Your doctor may also give you more specific instructions. Call your doctor if you have any problems or questions after your procedure. °HOME CARE °· Rest at home for 1-2 days or as told by your doctor. °· Have someone stay with you for at least 24 hours. °· Do not do these things in the first 24 hours: °¨ Drive. °¨ Use machinery. °¨ Take care of other people. °¨ Sign legal documents. °¨ Take a bath or shower. °· There are many different ways to close and cover a cut (incision). For example, a cut can be closed with stitches, skin glue, or adhesive strips. Follow your doctor's instructions on: °¨ Taking care of your cut. °¨ Changing and removing your bandage (dressing). °¨ Removing whatever was used to close your cut. °· Do not drink alcohol in the first week. °· Do not lift more than 5 pounds or play contact sports for the first 2 weeks. °· Take medicines only as told by your doctor. For 1 week, do not take medicine that has aspirin in it or medicines like ibuprofen. °· Get your test results. °GET HELP IF: °· A cut bleeds and leaves more than just a small spot of blood. °· A cut is red, puffs up (swells), or hurts more than before. °· Fluid or something else comes from a cut. °· A cut smells bad. °· You have a fever or chills. °GET HELP RIGHT AWAY IF: °· You have swelling, bloating, or pain in your belly (abdomen). °· You get dizzy or faint. °· You have a rash. °· You feel sick to your stomach (nauseous) or throw up (vomit). °· You have trouble breathing, feel short of breath, or feel faint. °· Your chest hurts. °· You have problems talking or seeing. °· You have trouble balancing or moving your arms or legs. °Document Released: 12/27/2007 Document Revised: 08/03/2013 Document Reviewed: 05/15/2013 °ExitCare® Patient Information ©2015 [employer]. This information is not intended to replace advice given to  you by your health care provider. Make sure you discuss any questions you have with your health care provider. ° °

## 2014-11-22 NOTE — H&P (Signed)
HPI: Patient found to have liver lesions, lymphadenopathy and right renal mass, seen by Dr. Irene Limbo and scheduled for image guided liver lesion biopsy today.   The patient has had a H&P performed within the last 30 days, all history, medications, and exam have been reviewed. The patient denies any interval changes since the H&P.  Medications: Prior to Admission medications   Medication Sig Start Date End Date Taking? Authorizing Provider  acetaminophen (TYLENOL) 650 MG CR tablet Take 1,300 mg by mouth 2 (two) times daily.   Yes Historical Provider, MD  amLODipine (NORVASC) 5 MG tablet Take 5 mg by mouth daily before breakfast.   Yes Historical Provider, MD  calcium-vitamin D (OSCAL WITH D) 500-200 MG-UNIT per tablet Take 1 tablet by mouth daily.   Yes Historical Provider, MD  colchicine 0.6 MG tablet Take 0.6 mg by mouth as needed (for gout flareups).    Yes Historical Provider, MD  estradiol (ESTRACE) 0.5 MG tablet Take 0.5 mg by mouth daily. 08/02/14  Yes Historical Provider, MD  lidocaine (XYLOCAINE) 2 % jelly Place 1 application into the urethra 4 (four) times daily as needed. For urethral pain Patient taking differently: Place 1 application into the urethra 4 (four) times daily as needed (urethral pain).  11/16/14  Yes [name], MD  Omega-3 Fatty Acids (FISH OIL) 300 MG CAPS Take 300 mg by mouth daily.    Yes Historical Provider, MD  trimethoprim (TRIMPEX) 100 MG tablet Take 100 mg by mouth daily after supper. Started 10/20/14, no stop date   Yes Historical Provider, MD     Vital Signs: BP 159/66 mmHg  Pulse 90  Temp(Src) 97.8 F (36.6 C) (Oral)  Resp 20  Ht 5\' 1"  (1.549 m)  Wt 138 lb (62.596 kg)  BMI 26.09 kg/m2  SpO2 98%  Physical Exam  Constitutional: She is oriented to person, place, and time. No distress.  HENT:  Head: Normocephalic and atraumatic.  Neck: No tracheal deviation present.  Cardiovascular: Normal rate and regular rhythm.  Exam reveals no gallop and no  friction rub.   No murmur heard. Pulmonary/Chest: Effort normal and breath sounds normal. No respiratory distress. She has no wheezes. She has no rales.  Abdominal: Soft. Bowel sounds are normal. She exhibits no distension. There is no tenderness.  Neurological: She is alert and oriented to person, place, and time.  Skin: Skin is warm and dry. She is not diaphoretic.    Mallampati Score:  MD Evaluation Airway: WNL Heart: WNL Abdomen: WNL Chest/ Lungs: WNL ASA  Classification: 3 Mallampati/Airway Score: One  Labs:  CBC:  Recent Labs  10/16/14 1611 10/17/14 0353 11/16/14 1502  WBC 11.2* 9.7 7.0  HGB 10.9* 9.3* 10.0*  HCT 35.2* 29.7* 31.8*  PLT 377 341 431*    COAGS: No results for input(s): INR, APTT in the last 8760 hours.  BMP:  Recent Labs  10/16/14 1611 10/17/14 0353 11/16/14 1502  NA 135 135 136  K 4.5 3.5 4.5  CL 101 101  --   CO2 24 24 22   GLUCOSE 174* 130* 92  BUN 21* 20 19.2  CALCIUM 9.8 8.9 10.4  CREATININE 1.52* 1.25* 1.4*  GFRNONAA 29* 37*  --   GFRAA 34* 43*  --     LIVER FUNCTION TESTS:  Recent Labs  11/16/14 1502  BILITOT 0.36  AST 21  ALT 13  ALKPHOS 86  PROT 8.2  ALBUMIN 3.2*    Assessment/Plan:  Dysuria Urinary frequency Urinary hesitancy Treated  for UTI since 08/2014 without relief- F/U CT with urology revealed liver lesions and right renal mass Weight loss 15 lbs since May 2016 Seen by Dr. Irene Limbo 11/16/14 Scheduled today for image guided liver lesion biopsy with sedation The patient has been NPO, no blood thinners taken, labs and vitals have been reviewed. Risks and Benefits discussed with the patient including, but not limited to bleeding, infection, damage to adjacent structures or low yield requiring additional tests. All of the patient's questions were answered, patient is agreeable to proceed. Consent signed and in chart.     SignedHedy Jacob 11/22/2014, 8:38 AM

## 2014-11-23 ENCOUNTER — Telehealth: Payer: Self-pay | Admitting: Hematology

## 2014-11-23 ENCOUNTER — Telehealth: Payer: Self-pay | Admitting: *Deleted

## 2014-11-23 ENCOUNTER — Other Ambulatory Visit: Payer: Self-pay | Admitting: *Deleted

## 2014-11-23 DIAGNOSIS — C659 Malignant neoplasm of unspecified renal pelvis: Secondary | ICD-10-CM

## 2014-11-23 LAB — CYTOLOGY, URINE

## 2014-11-23 MED ORDER — HYDROXYZINE HCL 25 MG PO TABS: 25.0000 mg | ORAL_TABLET | Freq: Once | ORAL | Status: DC

## 2014-11-23 NOTE — Telephone Encounter (Signed)
Confirmed appointments for 08/30 & 08/31

## 2014-11-23 NOTE — Progress Notes (Signed)
Called patient to verify that RX for hydroxyzine called into pharmacy to take prior to PET scan.

## 2014-11-23 NOTE — Telephone Encounter (Signed)
PT. IS FOR A PET SCAN ON 11/24/14. SHE IS REQUESTING A SEDATIVE TO TAKE BEFORE PET SCAN.

## 2014-11-24 ENCOUNTER — Ambulatory Visit (HOSPITAL_COMMUNITY)
Admission: RE | Admit: 2014-11-24 | Discharge: 2014-11-24 | Disposition: A | Discharge status: Home / Self Care | Payer: Medicare Other | Source: Ambulatory Visit | Attending: Hematology | Admitting: Hematology

## 2014-11-24 DIAGNOSIS — C787 Secondary malignant neoplasm of liver and intrahepatic bile duct: Secondary | ICD-10-CM | POA: Diagnosis not present

## 2014-11-24 DIAGNOSIS — R918 Other nonspecific abnormal finding of lung field: Secondary | ICD-10-CM | POA: Insufficient documentation

## 2014-11-24 DIAGNOSIS — C801 Malignant (primary) neoplasm, unspecified: Secondary | ICD-10-CM | POA: Insufficient documentation

## 2014-11-24 LAB — GLUCOSE, CAPILLARY: Glucose-Capillary: 101 mg/dL — ABNORMAL HIGH (ref 65–99)

## 2014-11-24 MED ORDER — FLUDEOXYGLUCOSE F - 18 (FDG) INJECTION
6.3100 | Freq: Once | INTRAVENOUS | Status: DC | PRN
  Administered 2014-11-24: 6.31 via INTRAVENOUS
  Filled 2014-11-24: qty 6.31

## 2014-11-30 ENCOUNTER — Telehealth: Payer: Self-pay | Admitting: Hematology

## 2014-11-30 ENCOUNTER — Encounter: Payer: Self-pay | Admitting: *Deleted

## 2014-11-30 ENCOUNTER — Other Ambulatory Visit: Payer: Medicare Other

## 2014-11-30 ENCOUNTER — Other Ambulatory Visit (HOSPITAL_BASED_OUTPATIENT_CLINIC_OR_DEPARTMENT_OTHER): Payer: Medicare Other

## 2014-11-30 ENCOUNTER — Ambulatory Visit (HOSPITAL_BASED_OUTPATIENT_CLINIC_OR_DEPARTMENT_OTHER): Payer: Medicare Other | Admitting: Hematology

## 2014-11-30 VITALS — BP 125/54 | HR 97 | Temp 97.8°F | Resp 18 | Ht 61.0 in | Wt 138.1 lb

## 2014-11-30 DIAGNOSIS — G893 Neoplasm related pain (acute) (chronic): Secondary | ICD-10-CM | POA: Diagnosis not present

## 2014-11-30 DIAGNOSIS — D649 Anemia, unspecified: Secondary | ICD-10-CM

## 2014-11-30 DIAGNOSIS — R3989 Other symptoms and signs involving the genitourinary system: Secondary | ICD-10-CM

## 2014-11-30 DIAGNOSIS — C651 Malignant neoplasm of right renal pelvis: Secondary | ICD-10-CM

## 2014-11-30 DIAGNOSIS — E876 Hypokalemia: Secondary | ICD-10-CM | POA: Diagnosis not present

## 2014-11-30 DIAGNOSIS — R634 Abnormal weight loss: Secondary | ICD-10-CM

## 2014-11-30 DIAGNOSIS — C659 Malignant neoplasm of unspecified renal pelvis: Secondary | ICD-10-CM

## 2014-11-30 DIAGNOSIS — N289 Disorder of kidney and ureter, unspecified: Secondary | ICD-10-CM | POA: Diagnosis not present

## 2014-11-30 LAB — CBC & DIFF AND RETIC
BASO%: 0.4 % (ref 0.0–2.0)
Basophils Absolute: 0 10*3/uL (ref 0.0–0.1)
EOS%: 0.5 % (ref 0.0–7.0)
Eosinophils Absolute: 0 10*3/uL (ref 0.0–0.5)
HCT: 30.2 % — ABNORMAL LOW (ref 34.8–46.6)
HGB: 9.3 g/dL — ABNORMAL LOW (ref 11.6–15.9)
IMMATURE RETIC FRACT: 15.9 % — AB (ref 1.60–10.00)
LYMPH#: 1.3 10*3/uL (ref 0.9–3.3)
LYMPH%: 23.2 % (ref 14.0–49.7)
MCH: 26.8 pg (ref 25.1–34.0)
MCHC: 30.8 g/dL — AB (ref 31.5–36.0)
MCV: 87 fL (ref 79.5–101.0)
MONO#: 1.2 10*3/uL — AB (ref 0.1–0.9)
MONO%: 21 % — ABNORMAL HIGH (ref 0.0–14.0)
NEUT%: 54.9 % (ref 38.4–76.8)
NEUTROS ABS: 3 10*3/uL (ref 1.5–6.5)
PLATELETS: 471 10*3/uL — AB (ref 145–400)
RBC: 3.47 10*6/uL — AB (ref 3.70–5.45)
RDW: 18.1 % — AB (ref 11.2–14.5)
RETIC %: 1.91 % (ref 0.70–2.10)
RETIC CT ABS: 66.28 10*3/uL (ref 33.70–90.70)
WBC: 5.5 10*3/uL (ref 3.9–10.3)

## 2014-11-30 LAB — COMPREHENSIVE METABOLIC PANEL (CC13)
ALT: 8 U/L (ref 0–55)
AST: 17 U/L (ref 5–34)
Albumin: 2.9 g/dL — ABNORMAL LOW (ref 3.5–5.0)
Alkaline Phosphatase: 88 U/L (ref 40–150)
Anion Gap: 8 mEq/L (ref 3–11)
BILIRUBIN TOTAL: 0.44 mg/dL (ref 0.20–1.20)
BUN: 14.5 mg/dL (ref 7.0–26.0)
CHLORIDE: 103 meq/L (ref 98–109)
CO2: 25 meq/L (ref 22–29)
CREATININE: 1.2 mg/dL — AB (ref 0.6–1.1)
Calcium: 9.8 mg/dL (ref 8.4–10.4)
EGFR: 40 mL/min/{1.73_m2} — ABNORMAL LOW (ref >90)
GLUCOSE: 85 mg/dL (ref 70–140)
Potassium: 5.3 mEq/L — ABNORMAL HIGH (ref 3.5–5.1)
SODIUM: 136 meq/L (ref 136–145)
TOTAL PROTEIN: 7.6 g/dL (ref 6.4–8.3)

## 2014-11-30 MED ORDER — ONDANSETRON HCL 4 MG PO TABS: 4.0000 mg | ORAL_TABLET | Freq: Three times a day (TID) | ORAL | Status: DC | PRN

## 2014-11-30 MED ORDER — SENNOSIDES-DOCUSATE SODIUM 8.6-50 MG PO TABS: 2.0000 | ORAL_TABLET | Freq: Every day | ORAL | Status: DC

## 2014-11-30 MED ORDER — OXYCODONE HCL 5 MG PO TABS: 2.5000 mg | ORAL_TABLET | ORAL | Status: DC | PRN

## 2014-11-30 NOTE — Telephone Encounter (Signed)
Gave patient avs report and appointments for August/September.  °

## 2014-12-01 ENCOUNTER — Other Ambulatory Visit: Payer: Self-pay | Admitting: *Deleted

## 2014-12-01 ENCOUNTER — Other Ambulatory Visit: Payer: Self-pay | Admitting: Hematology

## 2014-12-01 ENCOUNTER — Encounter: Payer: Self-pay | Admitting: Hematology

## 2014-12-01 ENCOUNTER — Ambulatory Visit: Payer: Medicare Other

## 2014-12-01 VITALS — BP 136/59 | HR 87 | Temp 98.4°F | Resp 20

## 2014-12-01 DIAGNOSIS — C659 Malignant neoplasm of unspecified renal pelvis: Secondary | ICD-10-CM

## 2014-12-01 MED ORDER — SODIUM CHLORIDE 0.9 % IV SOLN: Freq: Once | INTRAVENOUS | Status: DC

## 2014-12-01 MED ORDER — SODIUM CHLORIDE 0.9 % IJ SOLN
10.0000 mL | INTRAMUSCULAR | Status: DC | PRN
  Filled 2014-12-01: qty 10

## 2014-12-01 MED ORDER — SODIUM CHLORIDE 0.9 % IV SOLN: 1200.0000 mg | Freq: Once | INTRAVENOUS | Status: DC

## 2014-12-01 MED ORDER — HEPARIN SOD (PORK) LOCK FLUSH 100 UNIT/ML IV SOLN
500.0000 [IU] | Freq: Once | INTRAVENOUS | Status: DC | PRN
  Filled 2014-12-01: qty 5

## 2014-12-01 NOTE — Progress Notes (Signed)
Ok to treat today with potassium: 5.3 per MD Irene Limbo.

## 2014-12-01 NOTE — Patient Instructions (Signed)
Dr. Irene Limbo will call you in two days about your treatment plan.

## 2014-12-01 NOTE — Progress Notes (Signed)
Pt instructed to stop taking potassium supplements for level of 5.3 yesterday.   Per pharmacy, atezolizumab has not been approved by insurance at this time. Dr. Irene Limbo came to infusion chair to inform patient that it will take two more days to see if the med is approved and MD will call patient at that time to determine plan for pt. IV removed per protocol.

## 2014-12-01 NOTE — Progress Notes (Signed)
Hematology oncology clinic follow-up.  Date of service 11/30/2014  Patient Care Team: [name], MD as PCP - General (Internal Medicine)   Urologist:Dr. 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   HISTORY OF PRESENTING ILLNESS: Please see my previous note for details of initial presentation.  INTERVAL HISTORY  [name] is here for follow-up of her PET/CT scan results and final biopsy results. We discussed her urine cytology showing cells suspicious for transitional cell carcinoma and her liver lesion biopsy showing poorly differential carcinoma consistent with urothelial cancer. Her PET/CT scan shows widespread metastatic disease. She understands that this condition is life-threatening and treatments would likely be palliative and aim to try to control the disease for a while but would not be curative. She notes that she certainly does not want the treatment to be worse in the disease. She was quite hesitant to consider chemotherapy options. Also her elevated creatinine and advanced age, among other factors would  make her Cisplatin ineligible. We discussed the option of using Atezolizumab since it has had significant success in phase II trials both in the second line setting after failing cisplatin-based therapy and as an arm in the clinical trial for platinum in eligible patients. After discussing the pros and cons of treatment patient consent to proceed with Atezolizumab immunotherapy for treatment of her metastatic transitional cell carcinoma.  Patient notes that the lidocaine jelly help with some of the urethral discomfort but she is still having some bladder discomfort/spasms. We talked about trying to pain medication and she was agreeable to trying a low-dose of oxycodone.   MEDICAL HISTORY:  Past Medical History  Diagnosis Date  . Hypertension   . Edema leg    feet and ankles  . Headache(784.0)   . Esophagus disorder     Had esophagus stretched  . Arthritis      knees 03-10-12 had Cortisone injection  . Occipital neuralgia   . Ulcerative proctitis   . DDD (degenerative disc disease), cervical   . GERD (gastroesophageal reflux disease)     Benign stricture dilated in 2011  . Occipital neuralgia     Related to cervical degenerative disc disease  . Gout     Patient notes she has possible gout but has not been on chronic medications for this  . Peptic ulcer disease     Previous history in the remote past  . Ulcerative proctitis 2014    SURGICAL HISTORY: Past Surgical History  Procedure Laterality Date  . Cholecystectomy    . Appendectomy    . Abdominal hysterectomy    . Dilation and curettage of uterus    . Neck surgery      20 years ago  . Esophagogastroduodenoscopy    . Colonoscopy with propofol  03/25/2012    Procedure: COLONOSCOPY WITH PROPOFOL;  Surgeon: Garlan Fair, MD;  Location: [facility];  Service: Endoscopy;  Laterality: N/A;  . Flexible sigmoidoscopy N/A 04/26/2014    Procedure: FLEXIBLE SIGMOIDOSCOPY - UnSedated;  Surgeon: Garlan Fair, MD;  Location: [facility];  Service: Endoscopy;  Laterality: N/A;  . Esophageal dilation      For benign stricture in 2011  . Total abdominal hysterectomy w/ bilateral salpingoophorectomy      At age 59 due to miscarriage and retained products of conception causing significant uterine bleeding. Patient has been on estrogen replacement therapy since then.    SOCIAL HISTORY: Social History  Social History  . Marital Status: Married    Spouse Name: N/A  . Number of Children: N/A  . Years of Education: N/A   Occupational History  . Not on file.   Social History Main Topics  . Smoking status: Former Smoker    Quit date: 11/01/1983  . Smokeless tobacco: Never Used  . Alcohol Use: Yes  . Drug Use: No  . Sexual Activity: Not on file   Other Topics Concern  . Not on  file   Social History Narrative  Retired as Conservation officer, nature for [employer]. She is very well spoken and intelligent individual and exhibits a keen knowledge of her medical conditions.  FAMILY HISTORY: Family History  Problem Relation Age of Onset  . Lung cancer Sister   . Prostate cancer Brother   . Uterine cancer Maternal Aunt   . Breast cancer Paternal Grandmother   . Hodgkin's lymphoma Sister     ALLERGIES:  is allergic to nitrofurantoin; indomethacin; and lisinopril.  MEDICATIONS:  Current Outpatient Prescriptions  Medication Sig Dispense Refill  . acetaminophen (TYLENOL) 650 MG CR tablet Take 1,300 mg by mouth 2 (two) times daily.    . colchicine 0.6 MG tablet Take 0.6 mg by mouth as needed (for gout flareups).     Marland Kitchen estradiol (ESTRACE) 0.5 MG tablet Take 0.5 mg by mouth daily.  1  . lidocaine (XYLOCAINE) 2 % jelly Place 1 application into the urethra 4 (four) times daily as needed. For urethral pain (Patient taking differently: Place 1 application into the urethra 4 (four) times daily as needed (urethral pain). ) 30 mL 1  . ondansetron (ZOFRAN) 4 MG tablet Take 1 tablet (4 mg total) by mouth every 8 (eight) hours as needed for nausea or vomiting. 30 tablet 0  . oxyCODONE (OXY IR/ROXICODONE) 5 MG immediate release tablet Take 0.5-1 tablets (2.5-5 mg total) by mouth every 4 (four) hours as needed for moderate pain, severe pain or breakthrough pain. 60 tablet 0  . senna-docusate (SENNA S) 8.6-50 MG per tablet Take 2 tablets by mouth at bedtime. 60 tablet 1   No current facility-administered medications for this visit.   Facility-Administered Medications Ordered in Other Visits  Medication Dose Route Frequency Provider Last Rate Last Dose  . 0.9 %  sodium chloride infusion   Intravenous Once [name], MD      . heparin lock flush 100 unit/mL  500 Units Intracatheter Once PRN [name], MD      . sodium chloride 0.9 % injection 10 mL  10 mL  Intracatheter PRN [place], MD       REVIEW OF SYSTEMS:   Constitutional: Denies fevers, chills or abnormal night sweats Eyes: Denies blurriness of vision, double vision or watery eyes Ears, nose, mouth, throat, and face: Denies mucositis or sore throat Respiratory: Denies cough, dyspnea or wheezes Cardiovascular: Denies palpitation, chest discomfort or lower extremity swelling Gastrointestinal:  Denies nausea, heartburn or change in bowel habits Skin: Denies abnormal skin rashes Lymphatics: Denies new lymphadenopathy or easy bruising Neurological:Denies numbness, tingling or new weaknesses Behavioral/Psych: Mood is stable, no new changes  All other systems were reviewed with the patient and are negative.   PHYSICAL EXAMINATION: ECOG PERFORMANCE STATUS: 1  Filed Vitals:   11/30/14 1042  BP: 125/54  Pulse: 97  Temp: 97.8 F (36.6 C)  Resp: 18   Filed Weights   11/30/14 1042  Weight: 138 lb 1.6 oz (62.642 kg)   GENERAL  elderly Caucasian female, alert, no distress and comfortable SKIN: skin color, texture, turgor are normal, no rashes or significant lesions EYES: normal, conjunctiva are pink and non-injected, sclera clear OROPHARYNX:no exudate, no erythema and lips, buccal mucosa, and tongue normal  NECK: supple, thyroid normal size, non-tender, without nodularity LYMPH:  no palpable lymphadenopathy in the cervical, axillary or inguinal LUNGS: clear to auscultation and percussion with normal breathing effort HEART: regular rate & rhythm and no murmurs and no lower extremity edema ABDOMEN:abdomen soft, non-tender and normal bowel sounds Musculoskeletal:no cyanosis of digits and no clubbing  PSYCH: alert & oriented x 3 with fluent speech NEURO: no focal motor/sensory deficits  LABORATORY DATA:  . CBC Latest Ref Rng 11/30/2014 11/22/2014 11/16/2014  WBC 3.9 - 10.3 10e3/uL 5.5 6.3 7.0  Hemoglobin 11.6 - 15.9 g/dL 9.3(L) 9.6(L) 10.0(L)  Hematocrit 34.8 - 46.6 %  30.2(L) 31.1(L) 31.8(L)  Platelets 145 - 400 10e3/uL 471(H) 431(H) 431(H)      Recent Labs  10/16/14 1611 10/17/14 0353 11/16/14 1502 11/30/14 1008  NA 135 135 136 136  K 4.5 3.5 4.5 5.3*  CL 101 101  --   --   CO2 24 24 22 25   GLUCOSE 174* 130* 92 85  BUN 21* 20 19.2 14.5  CREATININE 1.52* 1.25* 1.4* 1.2*  CALCIUM 9.8 8.9 10.4 9.8  GFRNONAA 29* 37*  --   --   GFRAA 34* 43*  --   --   PROT  --   --  8.2 7.6  ALBUMIN  --   --  3.2* 2.9*  AST  --   --  21 17  ALT  --   --  13 8  ALKPHOS  --   --  86 88  BILITOT  --   --  0.36 0.44             RADIOGRAPHIC STUDIES:  PET/CT 11/24/2014:  IMPRESSION: 1. Multi focal hypermetabolism within nodal stations, pulmonary nodules, bones and liver. Most consistent with metastatic disease versus less likely lymphoma. 2. Hypermetabolism corresponding to soft tissue fullness in the right renal pelvis. This is suspicious for transitional cell carcinoma (possibly the primary). Infiltrative renal cell carcinoma or lymphoma felt less likely.     ASSESSMENT & PLAN:   81 year old Caucasian female with  #1 Right Renal pelvis metastatic transitional cell carcinoma.  PET scan reveals metastases to the lungs, liver, multiple nodal stations and bones. Her creatinine to 1.5 and GFR around 30 would likely make the patient cisplatin ineligible.  #2 Urethral pains and bladder discomfort. Urine cultures negative Plan  -Viscous lidocaine jelly given for urethral pain and discomfort. -May discontinue trimethoprim due to diarrhea and absence of obvious urinary tract infection at this time. -Given when necessary oxycodone 2.5-5 mg every 4 hours as needed for lower urinary tract discomfort. -Encouraged patient to be mobile since her ongoing hormonal replacement therapy places her at high risk for VTE. Patient is keen not to stop her hormone replacement therapy for fear of being uncomfortable with hot flashes and mood changes. -Will plan to  treat the patient with Atezolizumab 1200mg  Q3weeks especially given the patient's aversion to chemotherapy choices. Also patient the fact that the patient would be a poor candidate for cisplatin based therapy given her age and renal function. -Was scheduled for getting the medication on 12/01/2014 but the prolonged process was not completed and therefore this needs to be rescheduled as per pharmacy and hospital administration. -Based on insurance approval we will either proceed  as per this plan and if denied patient would be given the option of considering a clinical trial elsewhere if available or going with a less desirable carboplatin/gemcitabine regimen if agreeable. Patient will need to be set up for Xgeva shots q4weeks for SRE prophylaxi given the presence of bone metastases.  #2 Normocytic normochromic anemia likely due to anemia of chronic disease from her metastatic malignancy and chronic kidney disease. -No indication of blood transfusion at this time  -We will monitor and treat with when necessary blood transfusions if symptomatic.  #3 Urethral discomfort Viscous lidocaine jelly prescribed for when necessary use.  #4 mild hyperkalemia potassium 5.3 Likely due to ongoing potassium replacement despite being off the diuretics for months. Additionally could be from her trimethoprim. Plan -Discontinue oral potassium and trimethoprim -Encourage by mouth fluids We'll monitor  #5 history of hypertension patient's blood pressure is now lower given associated weight loss. Plan -We'll discontinue her amlodipine. She has been taken off hydrochlorothiazide appropriately about one month ago.   I appreciate the privilege of taking care of this wonderful patient .  All questions were answered. The patient knows to call the clinic with any problems, questions or concerns. I spent 35 minutes counseling the patient face to face. The total time spent in the appointment was 45 minutes and more than  50% was on counseling.   Sullivan Lone MD [place] [facility] Physician [facility]  (Office):       [phone] (Work cell):  [phone] (Fax):           [phone]

## 2014-12-02 ENCOUNTER — Other Ambulatory Visit: Payer: Self-pay | Admitting: *Deleted

## 2014-12-02 NOTE — Progress Notes (Signed)
Palliative Care referral made to [facility].

## 2014-12-03 ENCOUNTER — Other Ambulatory Visit: Payer: Self-pay | Admitting: *Deleted

## 2014-12-03 ENCOUNTER — Telehealth: Payer: Self-pay | Admitting: Hematology

## 2014-12-03 ENCOUNTER — Ambulatory Visit (HOSPITAL_BASED_OUTPATIENT_CLINIC_OR_DEPARTMENT_OTHER): Payer: Medicare Other

## 2014-12-03 VITALS — BP 135/60 | HR 68 | Temp 98.1°F | Resp 18

## 2014-12-03 DIAGNOSIS — C651 Malignant neoplasm of right renal pelvis: Secondary | ICD-10-CM | POA: Diagnosis not present

## 2014-12-03 DIAGNOSIS — Z5112 Encounter for antineoplastic immunotherapy: Secondary | ICD-10-CM

## 2014-12-03 DIAGNOSIS — C659 Malignant neoplasm of unspecified renal pelvis: Secondary | ICD-10-CM

## 2014-12-03 DIAGNOSIS — E876 Hypokalemia: Secondary | ICD-10-CM | POA: Diagnosis not present

## 2014-12-03 DIAGNOSIS — D649 Anemia, unspecified: Secondary | ICD-10-CM

## 2014-12-03 LAB — BASIC METABOLIC PANEL (CC13)
ANION GAP: 9 meq/L (ref 3–11)
BUN: 13.9 mg/dL (ref 7.0–26.0)
CHLORIDE: 104 meq/L (ref 98–109)
CO2: 22 mEq/L (ref 22–29)
Calcium: 9.4 mg/dL (ref 8.4–10.4)
Creatinine: 1.1 mg/dL (ref 0.6–1.1)
EGFR: 46 mL/min/{1.73_m2} — AB (ref >90)
GLUCOSE: 132 mg/dL (ref 70–140)
POTASSIUM: 4.5 meq/L (ref 3.5–5.1)
Sodium: 135 mEq/L — ABNORMAL LOW (ref 136–145)

## 2014-12-03 MED ORDER — SODIUM CHLORIDE 0.9 % IV SOLN
Freq: Once | INTRAVENOUS | Status: AC
  Administered 2014-12-03: 13:00:00 via INTRAVENOUS

## 2014-12-03 MED ORDER — SODIUM CHLORIDE 0.9 % IV SOLN
1200.0000 mg | Freq: Once | INTRAVENOUS | Status: AC
  Administered 2014-12-03: 1200 mg via INTRAVENOUS
  Filled 2014-12-03: qty 20

## 2014-12-03 NOTE — Telephone Encounter (Signed)
Chemo moved from 9/21 top 9/23,patient will get a new avs 9/6

## 2014-12-03 NOTE — Progress Notes (Signed)
0900: Reviewed labs with Dr. Irene Limbo. Okay to treat with labs from 11/30/14 BMP ordered to rechecked potassium.

## 2014-12-03 NOTE — Patient Instructions (Signed)
[facility] Discharge Instructions for Patients Receiving Chemotherapy  Today you received the following: Tecentriq  To help prevent nausea and vomiting after your treatment, we encourage you to take your nausea medication as directed.    If you develop nausea and vomiting that is not controlled by your nausea medication, call the clinic.   BELOW ARE SYMPTOMS THAT SHOULD BE REPORTED IMMEDIATELY:  *FEVER GREATER THAN 100.5 F  *CHILLS WITH OR WITHOUT FEVER  NAUSEA AND VOMITING THAT IS NOT CONTROLLED WITH YOUR NAUSEA MEDICATION  *UNUSUAL SHORTNESS OF BREATH  *UNUSUAL BRUISING OR BLEEDING  TENDERNESS IN MOUTH AND THROAT WITH OR WITHOUT PRESENCE OF ULCERS  *URINARY PROBLEMS  *BOWEL PROBLEMS  UNUSUAL RASH Items with * indicate a potential emergency and should be followed up as soon as possible.  Feel free to call the clinic you have any questions or concerns. The clinic phone number is [phone].  Please show the CHEMO ALERT CARD at check-in to the Emergency Department and triage nurse.   

## 2014-12-07 ENCOUNTER — Encounter: Payer: Self-pay | Admitting: Hematology

## 2014-12-07 ENCOUNTER — Other Ambulatory Visit: Payer: Self-pay | Admitting: Hematology

## 2014-12-07 ENCOUNTER — Telehealth: Payer: Self-pay | Admitting: Hematology

## 2014-12-07 ENCOUNTER — Ambulatory Visit (HOSPITAL_BASED_OUTPATIENT_CLINIC_OR_DEPARTMENT_OTHER): Payer: Medicare Other | Admitting: Hematology

## 2014-12-07 ENCOUNTER — Other Ambulatory Visit (HOSPITAL_BASED_OUTPATIENT_CLINIC_OR_DEPARTMENT_OTHER): Payer: Medicare Other

## 2014-12-07 VITALS — BP 135/64 | HR 88 | Temp 98.4°F | Resp 18 | Ht 61.0 in | Wt 140.2 lb

## 2014-12-07 DIAGNOSIS — C651 Malignant neoplasm of right renal pelvis: Secondary | ICD-10-CM

## 2014-12-07 DIAGNOSIS — C7951 Secondary malignant neoplasm of bone: Secondary | ICD-10-CM

## 2014-12-07 DIAGNOSIS — C659 Malignant neoplasm of unspecified renal pelvis: Secondary | ICD-10-CM

## 2014-12-07 DIAGNOSIS — C787 Secondary malignant neoplasm of liver and intrahepatic bile duct: Secondary | ICD-10-CM

## 2014-12-07 DIAGNOSIS — C778 Secondary and unspecified malignant neoplasm of lymph nodes of multiple regions: Secondary | ICD-10-CM | POA: Diagnosis not present

## 2014-12-07 DIAGNOSIS — D649 Anemia, unspecified: Secondary | ICD-10-CM

## 2014-12-07 DIAGNOSIS — N369 Urethral disorder, unspecified: Secondary | ICD-10-CM

## 2014-12-07 DIAGNOSIS — C7800 Secondary malignant neoplasm of unspecified lung: Secondary | ICD-10-CM

## 2014-12-07 DIAGNOSIS — R5383 Other fatigue: Secondary | ICD-10-CM | POA: Diagnosis not present

## 2014-12-07 LAB — CBC & DIFF AND RETIC
BASO%: 0.4 % (ref 0.0–2.0)
Basophils Absolute: 0 10*3/uL (ref 0.0–0.1)
EOS%: 0.4 % (ref 0.0–7.0)
Eosinophils Absolute: 0 10*3/uL (ref 0.0–0.5)
HCT: 31.2 % — ABNORMAL LOW (ref 34.8–46.6)
HGB: 9.6 g/dL — ABNORMAL LOW (ref 11.6–15.9)
Immature Retic Fract: 19.1 % — ABNORMAL HIGH (ref 1.60–10.00)
LYMPH#: 1.1 10*3/uL (ref 0.9–3.3)
LYMPH%: 24 % (ref 14.0–49.7)
MCH: 26.8 pg (ref 25.1–34.0)
MCHC: 30.8 g/dL — AB (ref 31.5–36.0)
MCV: 87.2 fL (ref 79.5–101.0)
MONO#: 0.7 10*3/uL (ref 0.1–0.9)
MONO%: 14.2 % — ABNORMAL HIGH (ref 0.0–14.0)
NEUT%: 61 % (ref 38.4–76.8)
NEUTROS ABS: 2.8 10*3/uL (ref 1.5–6.5)
PLATELETS: 403 10*3/uL — AB (ref 145–400)
RBC: 3.58 10*6/uL — AB (ref 3.70–5.45)
RDW: 18.1 % — AB (ref 11.2–14.5)
RETIC %: 1.74 % (ref 0.70–2.10)
RETIC CT ABS: 62.29 10*3/uL (ref 33.70–90.70)
WBC: 4.6 10*3/uL (ref 3.9–10.3)

## 2014-12-07 LAB — COMPREHENSIVE METABOLIC PANEL (CC13)
ALT: 7 U/L (ref 0–55)
AST: 18 U/L (ref 5–34)
Albumin: 2.9 g/dL — ABNORMAL LOW (ref 3.5–5.0)
Alkaline Phosphatase: 92 U/L (ref 40–150)
Anion Gap: 9 mEq/L (ref 3–11)
BUN: 12.7 mg/dL (ref 7.0–26.0)
CALCIUM: 9.5 mg/dL (ref 8.4–10.4)
CHLORIDE: 103 meq/L (ref 98–109)
CO2: 25 mEq/L (ref 22–29)
Creatinine: 1.1 mg/dL (ref 0.6–1.1)
EGFR: 47 mL/min/{1.73_m2} — ABNORMAL LOW (ref >90)
Glucose: 131 mg/dl (ref 70–140)
POTASSIUM: 4.4 meq/L (ref 3.5–5.1)
Sodium: 137 mEq/L (ref 136–145)
Total Bilirubin: 0.42 mg/dL (ref 0.20–1.20)
Total Protein: 7.4 g/dL (ref 6.4–8.3)

## 2014-12-07 LAB — TSH CHCC: TSH: 1.137 m(IU)/L (ref 0.308–3.960)

## 2014-12-07 MED ORDER — OXYBUTYNIN CHLORIDE 5 MG PO TABS
2.5000 mg | ORAL_TABLET | Freq: Two times a day (BID) | ORAL | Status: DC
Start: 2014-12-07 — End: 2015-01-14

## 2014-12-07 NOTE — Progress Notes (Signed)
Hematology oncology clinic follow-up.  Date of service 11/30/2014  Patient Care Team: [name], MD as PCP - General (Internal Medicine)   Urologist:Dr. 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   HISTORY OF PRESENTING ILLNESS: Please see my previous note for details of initial presentation.  INTERVAL HISTORY  Ms Duke is here for for her 1 week follow-up after having received the first dose of Atezolizumab for her metastatic transitional cell carcinoma. She notes no acute new concerns. Had a couple of loose bowel movements which she notes is not unusual for her. Some fatigue that is unchanged. Has been trying to eat well. She notes that the oxycodone has helped with some of her bladder pains but she still is having a fair amount of urinary frequency and urgency and is requesting something to help with this. We talked about possibly trying urinary bladder anti-spasmodic which she is open to and was given a prescription for low dose of oxybutynin. She notes the symptom is having the most affecting her quality of life. No fevers or chills. No skin rashes. No abdominal pain. No shortness of breath or chest pain. No headaches or focal neurological deficits.   MEDICAL HISTORY:  Past Medical History  Diagnosis Date  . Hypertension   . Edema leg     feet and ankles  . Headache(784.0)   . Esophagus disorder     Had esophagus stretched  . Arthritis      knees 03-10-12 had Cortisone injection  . Occipital neuralgia   . Ulcerative proctitis   . DDD (degenerative disc disease), cervical   . GERD (gastroesophageal reflux disease)     Benign stricture dilated in 2011  . Occipital neuralgia     Related to cervical degenerative disc disease  . Gout     Patient notes she has possible gout but has not been on chronic medications for this  . Peptic ulcer disease    Previous history in the remote past  . Ulcerative proctitis 2014    SURGICAL HISTORY: Past Surgical History  Procedure Laterality Date  . Cholecystectomy    . Appendectomy    . Abdominal hysterectomy    . Dilation and curettage of uterus    . Neck surgery      20 years ago  . Esophagogastroduodenoscopy    . Colonoscopy with propofol  03/25/2012    Procedure: COLONOSCOPY WITH PROPOFOL;  Surgeon: Garlan Fair, MD;  Location: [facility];  Service: Endoscopy;  Laterality: N/A;  . Flexible sigmoidoscopy N/A 04/26/2014    Procedure: FLEXIBLE SIGMOIDOSCOPY - UnSedated;  Surgeon: Garlan Fair, MD;  Location: [facility];  Service: Endoscopy;  Laterality: N/A;  . Esophageal dilation      For benign stricture in 2011  . Total abdominal hysterectomy w/ bilateral salpingoophorectomy      At age 29 due to miscarriage and retained products of conception causing significant uterine bleeding. Patient has been on estrogen replacement therapy since then.    SOCIAL HISTORY: Social History   Social History  . Marital Status: Married    Spouse Name: N/A  . Number of Children: N/A  . Years of Education: N/A   Occupational History  . Not on file.   Social History Main Topics  . Smoking status: Former Smoker    Quit date: 11/01/1983  . Smokeless tobacco: Never Used  . Alcohol Use: Yes  . Drug Use: No  .  Sexual Activity: Not on file   Other Topics Concern  . Not on file   Social History Narrative  Retired as Conservation officer, nature for [employer]. She is very well spoken and intelligent individual and exhibits a keen knowledge of her medical conditions.  FAMILY HISTORY: Family History  Problem Relation Age of Onset  . Lung cancer Sister   . Prostate cancer Brother   . Uterine cancer Maternal Aunt   . Breast cancer Paternal Grandmother   . Hodgkin's lymphoma Sister     ALLERGIES:  is allergic to nitrofurantoin; indomethacin; and lisinopril.  MEDICATIONS:    Current Outpatient Prescriptions  Medication Sig Dispense Refill  . acetaminophen (TYLENOL) 650 MG CR tablet Take 1,300 mg by mouth 2 (two) times daily.    Marland Kitchen amLODipine (NORVASC) 5 MG tablet Take 5 mg by mouth daily.    Marland Kitchen estradiol (ESTRACE) 0.5 MG tablet Take 0.5 mg by mouth daily.  1  . lidocaine (XYLOCAINE) 2 % jelly Place 1 application into the urethra 4 (four) times daily as needed. For urethral pain (Patient taking differently: Place 1 application into the urethra 4 (four) times daily as needed (urethral pain). ) 30 mL 1  . ondansetron (ZOFRAN) 4 MG tablet Take 1 tablet (4 mg total) by mouth every 8 (eight) hours as needed for nausea or vomiting. 30 tablet 0  . oxyCODONE (OXY IR/ROXICODONE) 5 MG immediate release tablet Take 0.5-1 tablets (2.5-5 mg total) by mouth every 4 (four) hours as needed for moderate pain, severe pain or breakthrough pain. 60 tablet 0  . colchicine 0.6 MG tablet Take 0.6 mg by mouth as needed (for gout flareups).     Marland Kitchen oxybutynin (DITROPAN) 5 MG tablet Take 0.5 tablets (2.5 mg total) by mouth 2 (two) times daily. 60 tablet 0  . senna-docusate (SENNA S) 8.6-50 MG per tablet Take 2 tablets by mouth at bedtime. (Patient not taking: Reported on 12/07/2014) 60 tablet 1   No current facility-administered medications for this visit.   REVIEW OF SYSTEMS:    10 point review of systems done and is negative except as noted above  PHYSICAL EXAMINATION: ECOG PERFORMANCE STATUS: 1  Filed Vitals:   12/07/14 1257  BP: 135/64  Pulse: 88  Temp: 98.4 F (36.9 C)  Resp: 18   Filed Weights   12/07/14 1257  Weight: 140 lb 3.2 oz (63.594 kg)   GENERAL elderly Caucasian female, alert, no distress and comfortable SKIN: skin color, texture, turgor are normal, no rashes or significant lesions EYES: normal, conjunctiva are pink and non-injected, sclera clear OROPHARYNX:no exudate, no erythema and lips, buccal mucosa, and tongue normal  NECK: supple, thyroid normal size,  non-tender, without nodularity LYMPH:  no palpable lymphadenopathy in the cervical, axillary or inguinal LUNGS: clear to auscultation and percussion with normal breathing effort HEART: regular rate & rhythm and no murmurs and no lower extremity edema ABDOMEN:abdomen soft, non-tender and normal bowel sounds Musculoskeletal:no cyanosis of digits and no clubbing  PSYCH: alert & oriented x 3 with fluent speech NEURO: no focal motor/sensory deficits  LABORATORY DATA:  . CBC Latest Ref Rng 12/07/2014 11/30/2014 11/22/2014  WBC 3.9 - 10.3 10e3/uL 4.6 5.5 6.3  Hemoglobin 11.6 - 15.9 g/dL 9.6(L) 9.3(L) 9.6(L)  Hematocrit 34.8 - 46.6 % 31.2(L) 30.2(L) 31.1(L)  Platelets 145 - 400 10e3/uL 403(H) 471(H) 431(H)      Recent Labs  10/16/14 1611 10/17/14 0353  11/16/14 1502 11/30/14 1008 12/03/14 1227 12/07/14 1231  NA 135 135  < >  136 136 135* 137  K 4.5 3.5  < > 4.5 5.3* 4.5 4.4  CL 101 101  --   --   --   --   --   CO2 24 24  < > 22 25 22 25   GLUCOSE 174* 130*  < > 92 85 132 131  BUN 21* 20  < > 19.2 14.5 13.9 12.7  CREATININE 1.52* 1.25*  < > 1.4* 1.2* 1.1 1.1  CALCIUM 9.8 8.9  < > 10.4 9.8 9.4 9.5  GFRNONAA 29* 37*  --   --   --   --   --   GFRAA 34* 43*  --   --   --   --   --   PROT  --   --   --  8.2 7.6  --  7.4  ALBUMIN  --   --   --  3.2* 2.9*  --  2.9*  AST  --   --   --  21 17  --  18  ALT  --   --   --  13 8  --  7  ALKPHOS  --   --   --  86 88  --  92  BILITOT  --   --   --  0.36 0.44  --  0.42  < > = values in this interval not displayed.           RADIOGRAPHIC STUDIES:  PET/CT 11/24/2014:  IMPRESSION: 1. Multi focal hypermetabolism within nodal stations, pulmonary nodules, bones and liver. Most consistent with metastatic disease versus less likely lymphoma. 2. Hypermetabolism corresponding to soft tissue fullness in the right renal pelvis. This is suspicious for transitional cell carcinoma (possibly the primary). Infiltrative renal cell carcinoma or  lymphoma felt less likely.     ASSESSMENT & PLAN:   81 year old Caucasian female with  #1 Right Renal pelvis metastatic transitional cell carcinoma.  PET scan reveals metastases to the lungs, liver, multiple nodal stations and bones. Her creatinine to 1.5 and GFR around 30 would likely make the patient cisplatin ineligible.  #2 Urethral pains and bladder spasms/ discomfort. Urine cultures negative Plan  -Patient seems to tolerate that the first dose of  Atezolizumab will with no significant adverse effects at this time. -Labs today appears stable. Creatinine is somewhat improved. -Continue when necessary oxycodone 2.5-5 mg every 4 hours as needed for lower urinary tract discomfort. -Given prescription to the patient for oxybutynin 2.5 mg by mouth twice a day for urinary urgency and bladder spasms will adjust the dose as needed. -Recommended she could use over-the-counter when necessary Imodium for mild diarrhea but should seek immediate attention if she is having more than 2-4 stools a day.  -If diarrhea persists will get a C. difficile testings and she was on trimethoprim for a bit prior to discontinuation . -Encouraged patient to be mobile since her ongoing hormonal replacement therapy places her at high risk for VTE. Patient is keen not to stop her hormone replacement therapy for fear of being uncomfortable with hot flashes and mood changes. -Patient to return to care in 2 weeks as per her scheduled next cycle of treatment. We will get labs and see her in clinic prior to her treatment. -Will plan to treat the patient with 1200mg  Q3weeks until treatment progression or residual toxicities. -We will plan to get a repeat PET CT scan after completion of 3 cycles of treatment. -Patient will need to be set up for [facility]  shots for SRE prophylaxis given the presence of bone metastases. We will start this with her next cycle of treatment on 12/24/2014 and will schedule it every 6 weeks so that she can  get with every other cycle off her immunotherapy treatment for convenience.   #2 Normocytic normochromic anemia likely due to anemia of chronic disease from her metastatic malignancy and chronic kidney disease. -No indication of blood transfusion at this time  -We will monitor and treat with when necessary blood transfusions if symptomatic.  #3 Urethral discomfort Viscous lidocaine jelly prescribed for when necessary use. -When necessary oxycodone -Given prescription for oxybutynin for bladder spasms at the lowest dose to determine tolerability.  #4 mild hyperkalemia now resolved  Likely due to ongoing potassium replacement despite being off the diuretics for months. Additionally could be from her trimethoprim.  #5 history of hypertension patient's blood pressure is now lower given associated weight loss. Plan Off amlodipine and hydrochlorothiazide monitor blood pressure. It is well controlled today with no concerns.  I appreciate the privilege of taking care of this wonderful patient .  All questions were answered. The patient knows to call the clinic with any problems, questions or concerns. I spent 20 minutes counseling the patient face to face. The total time spent in the appointment was 25 minutes and more than 50% was on counseling.   Sullivan Lone MD [facility] Hematology/Oncology Physician [facility]  (Office):       [phone] (Work cell):  [phone] (Fax):           [phone]

## 2014-12-07 NOTE — Telephone Encounter (Signed)
per pof to sch MD appt-sch and sent MW email to move infusion to coordinate w/MD appt-will call pt after reply

## 2014-12-08 ENCOUNTER — Telehealth: Payer: Self-pay | Admitting: *Deleted

## 2014-12-08 NOTE — Telephone Encounter (Signed)
I have adjusted 9/23 appt

## 2014-12-14 ENCOUNTER — Telehealth: Payer: Self-pay | Admitting: Hematology

## 2014-12-14 NOTE — Telephone Encounter (Signed)
called & spoke to  pt and adv of next appt sch time &date

## 2014-12-17 ENCOUNTER — Ambulatory Visit: Payer: Medicare Other | Admitting: Nutrition

## 2014-12-17 NOTE — Progress Notes (Signed)
81 y/o female Dx with Transitional Cell CA of Renal Pelvis.   PMH include ARF and diarrhea.  Meds include Amlodipine, Colchicine, and Ondansetron  Ht 61"  Wt 139.8# stable since 10/16/14 141.54# however; down 15# sine 04/26/14 Wt 155# BMI 26.5-overwt  Pt reports good appetite and po intake, but not like it used to be. Eating 3 meals/day, no snacks.  Some diarrhea reported, 1x/week-about 3 loose stools/day Drinking water, but admits maybe not enough, due to frequent urination.  No problems with bowels or issues chewing or swallowing.    Nutrition Dx: Food and nutrition related knowledge deficit r/t new Dx of Renal Cell CA AEB no prior need for nutrition related information.   Intervention: Pt educated on ways to increase kcal and protein to prevent wt loss and wt maintenance.  Recommended supplments as needed, provided samples of Ensure Enlive and Boost plus Reviewed ways to cope with diarrhea, if it becomes an issue. Discussed low fiber foods and the importance of adequate fluids. Encouraged snacks, ideas provided.  Teach back method used. RD phone number provided.   Monitoring, Evaluation, and Goals:  Pt will tolerate high kcal/high protein diet to promote wt maintenance and healing.    Next visit: To be scheduled as needed.

## 2014-12-22 ENCOUNTER — Ambulatory Visit: Payer: Medicare Other

## 2014-12-23 ENCOUNTER — Telehealth: Payer: Self-pay | Admitting: *Deleted

## 2014-12-23 NOTE — Telephone Encounter (Signed)
Pt called c/o "constipation" and blood and stools x2 days.  Pt denies pain with BM, pt denies any other sources of bleeding.  Pt stated she is taking the laxative prescribed to her.  Pt stated she has had several stools in the past couple days which are "not hard".  But is having less frequent BM than what is "normal for her".  Pt also c/o multiple red spots to arms which are slightly raised.  Pt denies any drainage/discharge.  Pt has MD visit tomorrow prior to treatment.  Instructed patient to continue laxative/stool softeners, may increase if necessary.  Informed patient that she is ok to wait until MD visit scheduled tomorrow to be seen, but to call in the mean time if any new or worsening symptoms occur.  Pt verbalized understanding.

## 2014-12-24 ENCOUNTER — Other Ambulatory Visit: Payer: Medicare Other

## 2014-12-24 ENCOUNTER — Other Ambulatory Visit (HOSPITAL_BASED_OUTPATIENT_CLINIC_OR_DEPARTMENT_OTHER): Payer: Medicare Other

## 2014-12-24 ENCOUNTER — Ambulatory Visit (HOSPITAL_BASED_OUTPATIENT_CLINIC_OR_DEPARTMENT_OTHER): Payer: Medicare Other

## 2014-12-24 ENCOUNTER — Telehealth: Payer: Self-pay | Admitting: Hematology

## 2014-12-24 ENCOUNTER — Ambulatory Visit (HOSPITAL_BASED_OUTPATIENT_CLINIC_OR_DEPARTMENT_OTHER): Payer: Medicare Other | Admitting: Hematology

## 2014-12-24 ENCOUNTER — Encounter: Payer: Self-pay | Admitting: Hematology

## 2014-12-24 VITALS — BP 141/79 | HR 73 | Temp 98.4°F | Resp 18 | Ht 61.0 in | Wt 138.2 lb

## 2014-12-24 DIAGNOSIS — C7951 Secondary malignant neoplasm of bone: Secondary | ICD-10-CM | POA: Diagnosis not present

## 2014-12-24 DIAGNOSIS — R21 Rash and other nonspecific skin eruption: Secondary | ICD-10-CM | POA: Diagnosis not present

## 2014-12-24 DIAGNOSIS — Z5112 Encounter for antineoplastic immunotherapy: Secondary | ICD-10-CM | POA: Diagnosis not present

## 2014-12-24 DIAGNOSIS — C651 Malignant neoplasm of right renal pelvis: Secondary | ICD-10-CM

## 2014-12-24 DIAGNOSIS — C787 Secondary malignant neoplasm of liver and intrahepatic bile duct: Secondary | ICD-10-CM

## 2014-12-24 DIAGNOSIS — C659 Malignant neoplasm of unspecified renal pelvis: Secondary | ICD-10-CM

## 2014-12-24 DIAGNOSIS — R197 Diarrhea, unspecified: Secondary | ICD-10-CM

## 2014-12-24 DIAGNOSIS — C778 Secondary and unspecified malignant neoplasm of lymph nodes of multiple regions: Secondary | ICD-10-CM

## 2014-12-24 LAB — CBC & DIFF AND RETIC
BASO%: 0.4 % (ref 0.0–2.0)
BASOS ABS: 0 10*3/uL (ref 0.0–0.1)
EOS ABS: 0 10*3/uL (ref 0.0–0.5)
EOS%: 0.6 % (ref 0.0–7.0)
HEMATOCRIT: 30.7 % — AB (ref 34.8–46.6)
HEMOGLOBIN: 9.6 g/dL — AB (ref 11.6–15.9)
Immature Retic Fract: 12.5 % — ABNORMAL HIGH (ref 1.60–10.00)
LYMPH%: 44.9 % (ref 14.0–49.7)
MCH: 27.5 pg (ref 25.1–34.0)
MCHC: 31.3 g/dL — ABNORMAL LOW (ref 31.5–36.0)
MCV: 88 fL (ref 79.5–101.0)
MONO#: 0.6 10*3/uL (ref 0.1–0.9)
MONO%: 12.2 % (ref 0.0–14.0)
NEUT#: 2 10*3/uL (ref 1.5–6.5)
NEUT%: 41.9 % (ref 38.4–76.8)
Platelets: 362 10*3/uL (ref 145–400)
RBC: 3.49 10*6/uL — ABNORMAL LOW (ref 3.70–5.45)
RDW: 20.1 % — AB (ref 11.2–14.5)
RETIC %: 3.31 % — AB (ref 0.70–2.10)
RETIC CT ABS: 115.52 10*3/uL — AB (ref 33.70–90.70)
WBC: 4.7 10*3/uL (ref 3.9–10.3)
lymph#: 2.1 10*3/uL (ref 0.9–3.3)

## 2014-12-24 LAB — COMPREHENSIVE METABOLIC PANEL (CC13)
ALBUMIN: 3.2 g/dL — AB (ref 3.5–5.0)
ALK PHOS: 74 U/L (ref 40–150)
ALT: 12 U/L (ref 0–55)
AST: 20 U/L (ref 5–34)
Anion Gap: 9 mEq/L (ref 3–11)
BUN: 12.4 mg/dL (ref 7.0–26.0)
CALCIUM: 9 mg/dL (ref 8.4–10.4)
CO2: 23 mEq/L (ref 22–29)
CREATININE: 1 mg/dL (ref 0.6–1.1)
Chloride: 108 mEq/L (ref 98–109)
EGFR: 53 mL/min/{1.73_m2} — ABNORMAL LOW (ref >90)
Glucose: 139 mg/dl (ref 70–140)
Potassium: 4.1 mEq/L (ref 3.5–5.1)
Sodium: 140 mEq/L (ref 136–145)
Total Bilirubin: 0.57 mg/dL (ref 0.20–1.20)
Total Protein: 7.3 g/dL (ref 6.4–8.3)

## 2014-12-24 LAB — C-REACTIVE PROTEIN: CRP: 2 mg/dL — AB (ref <0.60)

## 2014-12-24 LAB — MAGNESIUM (CC13): MAGNESIUM: 2.1 mg/dL (ref 1.5–2.5)

## 2014-12-24 MED ORDER — SODIUM CHLORIDE 0.9 % IV SOLN
1200.0000 mg | Freq: Once | INTRAVENOUS | Status: AC
  Administered 2014-12-24: 1200 mg via INTRAVENOUS
  Filled 2014-12-24: qty 20

## 2014-12-24 MED ORDER — SODIUM CHLORIDE 0.9 % IV SOLN
Freq: Once | INTRAVENOUS | Status: AC
  Administered 2014-12-24: 13:00:00 via INTRAVENOUS

## 2014-12-24 MED ORDER — VANCOMYCIN HCL 125 MG PO CAPS: 125.0000 mg | ORAL_CAPSULE | Freq: Four times a day (QID) | ORAL | Status: AC

## 2014-12-24 MED ORDER — TRIAMCINOLONE ACETONIDE 0.5 % EX OINT: 1.0000 "application " | TOPICAL_OINTMENT | Freq: Two times a day (BID) | CUTANEOUS | Status: DC

## 2014-12-24 MED ORDER — DENOSUMAB 120 MG/1.7ML ~~LOC~~ SOLN
120.0000 mg | Freq: Once | SUBCUTANEOUS | Status: AC
  Administered 2014-12-24: 120 mg via SUBCUTANEOUS
  Filled 2014-12-24: qty 1.7

## 2014-12-24 NOTE — Patient Instructions (Signed)
[facility] Discharge Instructions for Patients Receiving Chemotherapy  Today you received the following chemotherapy agents Tecentriq To help prevent nausea and vomiting after your treatment, we encourage you to take your nausea medication as prescribed.   If you develop nausea and vomiting that is not controlled by your nausea medication, call the clinic.   BELOW ARE SYMPTOMS THAT SHOULD BE REPORTED IMMEDIATELY:  *FEVER GREATER THAN 100.5 F  *CHILLS WITH OR WITHOUT FEVER  NAUSEA AND VOMITING THAT IS NOT CONTROLLED WITH YOUR NAUSEA MEDICATION  *UNUSUAL SHORTNESS OF BREATH  *UNUSUAL BRUISING OR BLEEDING  TENDERNESS IN MOUTH AND THROAT WITH OR WITHOUT PRESENCE OF ULCERS  *URINARY PROBLEMS  *BOWEL PROBLEMS  UNUSUAL RASH Items with * indicate a potential emergency and should be followed up as soon as possible.  Feel free to call the clinic you have any questions or concerns. The clinic phone number is [phone].  Please show the CHEMO ALERT CARD at check-in to the Emergency Department and triage nurse.   

## 2014-12-24 NOTE — Progress Notes (Signed)
Infusion RN to draw labs in infusion today, per Dr. Irene Limbo no need to wait on lab results.

## 2014-12-24 NOTE — Telephone Encounter (Signed)
No instructions per 09/23 POF, gave pt AVS and Calendar.Cherylann Banas, advised pt [facility] will call her to schedule her Port placement...Marland KitchenMarland Kitchen

## 2014-12-27 ENCOUNTER — Other Ambulatory Visit: Payer: Self-pay | Admitting: Radiology

## 2014-12-27 ENCOUNTER — Other Ambulatory Visit (HOSPITAL_COMMUNITY)
Admission: RE | Admit: 2014-12-27 | Discharge: 2014-12-27 | Disposition: A | Discharge status: Home / Self Care | Payer: Medicare Other | Source: Ambulatory Visit | Attending: Hematology | Admitting: Hematology

## 2014-12-27 DIAGNOSIS — A09 Infectious gastroenteritis and colitis, unspecified: Secondary | ICD-10-CM | POA: Insufficient documentation

## 2014-12-27 LAB — C DIFFICILE QUICK SCREEN W PCR REFLEX
C DIFFICLE (CDIFF) ANTIGEN: NEGATIVE
C Diff interpretation: NEGATIVE
C Diff toxin: NEGATIVE

## 2014-12-29 ENCOUNTER — Ambulatory Visit (HOSPITAL_COMMUNITY): Payer: Medicare Other

## 2014-12-29 ENCOUNTER — Other Ambulatory Visit: Payer: Self-pay | Admitting: Radiology

## 2014-12-29 ENCOUNTER — Other Ambulatory Visit (HOSPITAL_COMMUNITY): Payer: Medicare Other

## 2014-12-30 ENCOUNTER — Other Ambulatory Visit: Payer: Self-pay | Admitting: *Deleted

## 2014-12-30 DIAGNOSIS — C649 Malignant neoplasm of unspecified kidney, except renal pelvis: Secondary | ICD-10-CM

## 2014-12-31 ENCOUNTER — Other Ambulatory Visit: Payer: Self-pay | Admitting: Radiology

## 2014-12-31 ENCOUNTER — Telehealth: Payer: Self-pay | Admitting: Hematology

## 2014-12-31 NOTE — Telephone Encounter (Signed)
Called patient and she is aware of her lab and chemo   Tiffany [name]

## 2014-12-31 NOTE — Progress Notes (Signed)
Hematology oncology clinic follow-up.  Date of service 11/30/2014  Patient Care Team: [name], MD as PCP - General (Internal Medicine)   Urologist:Dr. 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   HISTORY OF PRESENTING ILLNESS: Please see my previous note for details of initial presentation.  INTERVAL HISTORY  [name] is here for for her for her scheduled follow-up prior to her second cycle of Atezolizumab. She notes that she is feeling well overall. Has noticed a grade 1 macular rash on her upper extremities and a small one on the face. Slightly pruritic and not painful. She notes some mucousy stools with little bit of blood. She has been on antibiotics for a long time for her dysuria as per her urologist previously which increases the chances of C. difficile colitis. 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 He was given empiric oral vancomycin pending results of her C. difficile stool testing. No significant diarrhea. No other acute new concerns. She notes that the oxybutynin was not very helpful and that she has not been using it. Notes that her dysuria has been reasonably controlled at this time. No fevers/chills/night sweats/shortness of blood/chest pain/abdominal pain.  Had difficult time with IV access and was agreeable to a port placement and this was requested. We also talked about starting her on XGeva were given her multiple bony metastases. She has no current dental issues and provided informed consent for this.  MEDICAL HISTORY:  Past Medical History  Diagnosis Date  . Hypertension   . Edema leg     feet and ankles  . Headache(784.0)   . Esophagus disorder     Had esophagus stretched  . Arthritis      knees 03-10-12 had Cortisone injection  . Occipital neuralgia   . Ulcerative proctitis   . DDD (degenerative disc disease), cervical   .  GERD (gastroesophageal reflux disease)     Benign stricture dilated in 2011  . Occipital neuralgia     Related to cervical degenerative disc disease  . Gout     Patient notes she has possible gout but has not been on chronic medications for this  . Peptic ulcer disease     Previous history in the remote past  . Ulcerative proctitis 2014    SURGICAL HISTORY: Past Surgical History  Procedure Laterality Date  . Cholecystectomy    . Appendectomy    . Abdominal hysterectomy    . Dilation and curettage of uterus    . Neck surgery      20 years ago  . Esophagogastroduodenoscopy    . Colonoscopy with propofol  03/25/2012    Procedure: COLONOSCOPY WITH PROPOFOL;  Surgeon: Garlan Fair, MD;  Location: [facility];  Service: Endoscopy;  Laterality: N/A;  . Flexible sigmoidoscopy N/A 04/26/2014    Procedure: FLEXIBLE SIGMOIDOSCOPY - UnSedated;  Surgeon: Garlan Fair, MD;  Location: [facility];  Service: Endoscopy;  Laterality: N/A;  . Esophageal dilation      For benign stricture in 2011  . Total abdominal hysterectomy w/ bilateral salpingoophorectomy      At age 66 due to miscarriage and retained products of conception causing significant uterine bleeding. Patient has been on estrogen replacement therapy since then.    SOCIAL HISTORY: Social History   Social History  . Marital Status: Married    Spouse Name: N/A  . Number of Children: N/A  . Years  of Education: N/A   Occupational History  . Not on file.   Social History Main Topics  . Smoking status: Former Smoker    Quit date: 11/01/1983  . Smokeless tobacco: Never Used  . Alcohol Use: Yes  . Drug Use: No  . Sexual Activity: Not on file   Other Topics Concern  . Not on file   Social History Narrative  Retired as Conservation officer, nature for [employer]. She is very well spoken and intelligent individual and exhibits a keen knowledge of her medical conditions.  FAMILY HISTORY: Family History  Problem  Relation Age of Onset  . Lung cancer Sister   . Prostate cancer Brother   . Uterine cancer Maternal Aunt   . Breast cancer Paternal Grandmother   . Hodgkin's lymphoma Sister     ALLERGIES:  is allergic to nitrofurantoin; indomethacin; and lisinopril.  MEDICATIONS:  Current Outpatient Prescriptions  Medication Sig Dispense Refill  . acetaminophen (TYLENOL) 650 MG CR tablet Take 1,300 mg by mouth 2 (two) times daily.    Marland Kitchen amLODipine (NORVASC) 5 MG tablet Take 5 mg by mouth daily.    . colchicine 0.6 MG tablet Take 0.6 mg by mouth as needed (for gout flareups).     . docusate sodium (COLACE) 100 MG capsule Take 100 mg by mouth 2 (two) times daily.    Marland Kitchen estradiol (ESTRACE) 0.5 MG tablet Take 0.5 mg by mouth daily.  1  . lidocaine (XYLOCAINE) 2 % jelly Place 1 application into the urethra 4 (four) times daily as needed. For urethral pain (Patient taking differently: Place 1 application into the urethra 4 (four) times daily as needed (urethral pain). ) 30 mL 1  . ondansetron (ZOFRAN) 4 MG tablet Take 1 tablet (4 mg total) by mouth every 8 (eight) hours as needed for nausea or vomiting. 30 tablet 0  . oxyCODONE (OXY IR/ROXICODONE) 5 MG immediate release tablet Take 0.5-1 tablets (2.5-5 mg total) by mouth every 4 (four) hours as needed for moderate pain, severe pain or breakthrough pain. 60 tablet 0  . senna-docusate (SENNA S) 8.6-50 MG per tablet Take 2 tablets by mouth at bedtime. 60 tablet 1  . oxybutynin (DITROPAN) 5 MG tablet Take 0.5 tablets (2.5 mg total) by mouth 2 (two) times daily. (Patient not taking: Reported on 12/24/2014) 60 tablet 0  . triamcinolone ointment (KENALOG) 0.5 % Apply 1 application topically 2 (two) times daily. 30 g 0  . vancomycin (VANCOCIN) 125 MG capsule Take 1 capsule (125 mg total) by mouth 4 (four) times daily. 40 capsule 0   No current facility-administered medications for this visit.   REVIEW OF SYSTEMS:    10 point review of systems done and is negative  except as noted above  PHYSICAL EXAMINATION: ECOG PERFORMANCE STATUS: 1  Filed Vitals:   12/24/14 1110  BP: 141/79  Pulse: 73  Temp: 98.4 F (36.9 C)  Resp: 18   Filed Weights   12/24/14 1110  Weight: 138 lb 3.2 oz (62.687 kg)   GENERAL elderly Caucasian female, alert, no distress and comfortable SKIN: Macular reddish colored spotty rash over upper extremities and one spot on the face. Mildly pruritic nonpainful. EYES: normal, conjunctiva are pink and non-injected, sclera clear OROPHARYNX:no exudate, no erythema and lips, buccal mucosa, and tongue normal  NECK: supple, thyroid normal size, non-tender, without nodularity LYMPH:  no palpable lymphadenopathy in the cervical, axillary or inguinal LUNGS: clear to auscultation and percussion with normal breathing effort HEART: regular rate &  rhythm and no murmurs and no lower extremity edema ABDOMEN:abdomen soft, non-tender and normal bowel sounds Musculoskeletal:no cyanosis of digits and no clubbing  PSYCH: alert & oriented x 3 with fluent speech NEURO: no focal motor/sensory deficits  LABORATORY DATA:  . CBC Latest Ref Rng 12/24/2014 12/07/2014 11/30/2014  WBC 3.9 - 10.3 10e3/uL 4.7 4.6 5.5  Hemoglobin 11.6 - 15.9 g/dL 9.6(L) 9.6(L) 9.3(L)  Hematocrit 34.8 - 46.6 % 30.7(L) 31.2(L) 30.2(L)  Platelets 145 - 400 10e3/uL 362 403(H) 471(H)      Recent Labs  10/16/14 1611 10/17/14 0353  11/30/14 1008 12/03/14 1227 12/07/14 1231 12/24/14 1234  NA 135 135  < > 136 135* 137 140  K 4.5 3.5  < > 5.3* 4.5 4.4 4.1  CL 101 101  --   --   --   --   --   CO2 24 24  < > 25 22 25 23   GLUCOSE 174* 130*  < > 85 132 131 139  BUN 21* 20  < > 14.5 13.9 12.7 12.4  CREATININE 1.52* 1.25*  < > 1.2* 1.1 1.1 1.0  CALCIUM 9.8 8.9  < > 9.8 9.4 9.5 9.0  GFRNONAA 29* 37*  --   --   --   --   --   GFRAA 34* 43*  --   --   --   --   --   PROT  --   --   < > 7.6  --  7.4 7.3  ALBUMIN  --   --   < > 2.9*  --  2.9* 3.2*  AST  --   --   < > 17  --  18  20  ALT  --   --   < > 8  --  7 12  ALKPHOS  --   --   < > 88  --  92 74  BILITOT  --   --   < > 0.44  --  0.42 0.57  < > = values in this interval not displayed.           RADIOGRAPHIC STUDIES:  PET/CT 11/24/2014:  IMPRESSION: 1. Multi focal hypermetabolism within nodal stations, pulmonary nodules, bones and liver. Most consistent with metastatic disease versus less likely lymphoma. 2. Hypermetabolism corresponding to soft tissue fullness in the right renal pelvis. This is suspicious for transitional cell carcinoma (possibly the primary). Infiltrative renal cell carcinoma or lymphoma felt less likely.     ASSESSMENT & PLAN:   81 year old Caucasian female with  #1 Right Renal pelvis metastatic transitional cell carcinoma.  PET scan reveals metastases to the lungs, liver, multiple nodal stations and bones. Her creatinine to 1.5 and GFR around 30 would likely make the patient cisplatin ineligible.  #2 Urethral pains and bladder spasms/ discomfort. Urine cultures negative. Improved symptoms oxybutynin not helpful.  #3 skin rash likely related to her Atezolizumab  #4 poor IV access for labs and medication administration agreeable to report   #5  Mucous and slight blood in stools. No diarrhea Plan  -Labs stable and patient is appropriate to receive second cycle of Atezolizumab. Today -Prescribed triamcinolone ointment for her skin rash and give her a referral to see dermatology for evaluation of the rash and possible skin biopsy. -IR referral for port placement -Empiric oral vancomycin for presumptive C. difficile treatment. -if C. difficile negative and her symptoms continue might need to consider GI referral plus or minus hydrocortisone enemas/budesonide orally. -Continue when necessary oxycodone  2.5-5 mg every 4 hours as needed for lower urinary tract discomfort. -Given prescription to the patient for oxybutynin 2.5 mg by mouth twice a day for urinary urgency and  bladder spasms will adjust the dose as needed. -Recommended she could use over-the-counter when necessary Imodium for mild diarrhea but should seek immediate attention if she is having more than 2-4 stools a day.  -Will plan to treat the patient with 1200mg  Q3weeks until treatment progression or residual toxicities. -We will plan to get a repeat PET CT scan after completion of 3 cycles of treatment. -patient was started on Xgeva shots for SRE prophylaxis given the presence of bone metastases.  -we will schedule it every 6 weeks so that she can get with every other cycle off her immunotherapy treatment for convenience.   #6 Normocytic normochromic anemia likely due to anemia of chronic disease from her metastatic malignancy and chronic kidney disease. -No indication of blood transfusion at this time  -We will monitor and treat with when necessary blood transfusions if symptomatic.  #7 Urethral discomfort Viscous lidocaine jelly prescribed for when necessary use. -When necessary oxycodone -Given prescription for oxybutynin for bladder spasms at the lowest dose to determine tolerability.  #8 history of hypertension patient's blood pressure is now lower given associated weight loss. Plan Off amlodipine and hydrochlorothiazide monitor blood pressure. It is well controlled today with no concerns.  I appreciate the privilege of taking care of this wonderful patient .  All questions were answered. The patient knows to call the clinic with any problems, questions or concerns. I spent 20 minutes counseling the patient face to face. The total time spent in the appointment was 25 minutes and more than 50% was on counseling.   Sullivan Lone MD [place] [facility]/On[facility] Physician [facility]  (Office):       [phone] (Work cell):  [phone] (Fax):           [phone]

## 2015-01-03 ENCOUNTER — Ambulatory Visit (HOSPITAL_COMMUNITY)
Admission: RE | Admit: 2015-01-03 | Discharge: 2015-01-03 | Disposition: A | Discharge status: Home / Self Care | Payer: Medicare Other | Source: Ambulatory Visit | Attending: Hematology | Admitting: Hematology

## 2015-01-03 ENCOUNTER — Other Ambulatory Visit: Payer: Self-pay | Admitting: Hematology

## 2015-01-03 ENCOUNTER — Encounter (HOSPITAL_COMMUNITY): Payer: Self-pay

## 2015-01-03 DIAGNOSIS — C659 Malignant neoplasm of unspecified renal pelvis: Secondary | ICD-10-CM

## 2015-01-03 DIAGNOSIS — Z79899 Other long term (current) drug therapy: Secondary | ICD-10-CM | POA: Diagnosis not present

## 2015-01-03 DIAGNOSIS — C651 Malignant neoplasm of right renal pelvis: Secondary | ICD-10-CM | POA: Insufficient documentation

## 2015-01-03 DIAGNOSIS — K921 Melena: Secondary | ICD-10-CM | POA: Diagnosis not present

## 2015-01-03 HISTORY — DX: Malignant (primary) neoplasm, unspecified: C80.1

## 2015-01-03 LAB — CBC WITH DIFFERENTIAL/PLATELET
BASOS PCT: 0 %
Basophils Absolute: 0 10*3/uL (ref 0.0–0.1)
EOS ABS: 0.1 10*3/uL (ref 0.0–0.7)
Eosinophils Relative: 2 %
HCT: 30.6 % — ABNORMAL LOW (ref 36.0–46.0)
Hemoglobin: 9.7 g/dL — ABNORMAL LOW (ref 12.0–15.0)
LYMPHS ABS: 1.7 10*3/uL (ref 0.7–4.0)
Lymphocytes Relative: 26 %
MCH: 28.2 pg (ref 26.0–34.0)
MCHC: 31.7 g/dL (ref 30.0–36.0)
MCV: 89 fL (ref 78.0–100.0)
MONO ABS: 1.3 10*3/uL — AB (ref 0.1–1.0)
Monocytes Relative: 20 %
NEUTROS ABS: 3.4 10*3/uL (ref 1.7–7.7)
NEUTROS PCT: 52 %
PLATELETS: 334 10*3/uL (ref 150–400)
RBC: 3.44 MIL/uL — ABNORMAL LOW (ref 3.87–5.11)
RDW: 20.2 % — AB (ref 11.5–15.5)
WBC: 6.5 10*3/uL (ref 4.0–10.5)

## 2015-01-03 LAB — PROTIME-INR
INR: 1.09 (ref 0.00–1.49)
PROTHROMBIN TIME: 14.3 s (ref 11.6–15.2)

## 2015-01-03 MED ORDER — LIDOCAINE-EPINEPHRINE 2 %-1:100000 IJ SOLN
INTRAMUSCULAR | Status: AC
Start: 2015-01-03 — End: 2015-01-04
  Filled 2015-01-03: qty 1

## 2015-01-03 MED ORDER — HEPARIN SOD (PORK) LOCK FLUSH 100 UNIT/ML IV SOLN
INTRAVENOUS | Status: AC
  Filled 2015-01-03: qty 5

## 2015-01-03 MED ORDER — FENTANYL CITRATE (PF) 100 MCG/2ML IJ SOLN
INTRAMUSCULAR | Status: AC
  Filled 2015-01-03: qty 4

## 2015-01-03 MED ORDER — FENTANYL CITRATE (PF) 100 MCG/2ML IJ SOLN
INTRAMUSCULAR | Status: AC | PRN
  Administered 2015-01-03: 50 ug via INTRAVENOUS

## 2015-01-03 MED ORDER — MIDAZOLAM HCL 2 MG/2ML IJ SOLN
INTRAMUSCULAR | Status: AC
  Filled 2015-01-03: qty 6

## 2015-01-03 MED ORDER — CEFAZOLIN SODIUM-DEXTROSE 2-3 GM-% IV SOLR: 2.0000 g | INTRAVENOUS | Status: DC

## 2015-01-03 MED ORDER — HEPARIN SOD (PORK) LOCK FLUSH 100 UNIT/ML IV SOLN
INTRAVENOUS | Status: AC | PRN
  Administered 2015-01-03: 500 [IU]

## 2015-01-03 MED ORDER — SODIUM CHLORIDE 0.9 % IV SOLN
INTRAVENOUS | Status: DC
  Administered 2015-01-03: 10:00:00 via INTRAVENOUS

## 2015-01-03 MED ORDER — MIDAZOLAM HCL 2 MG/2ML IJ SOLN
INTRAMUSCULAR | Status: AC | PRN
  Administered 2015-01-03: 0.5 mg via INTRAVENOUS
  Administered 2015-01-03 (×2): 1 mg via INTRAVENOUS

## 2015-01-03 MED ORDER — CEFAZOLIN SODIUM-DEXTROSE 2-3 GM-% IV SOLR
INTRAVENOUS | Status: AC
  Filled 2015-01-03: qty 50

## 2015-01-03 NOTE — Discharge Instructions (Signed)
Implanted Port Insertion, Care After Refer to this sheet in the next few weeks. These instructions provide you with information on caring for yourself after your procedure. Your health care provider may also give you more specific instructions. Your treatment has been planned according to current medical practices, but problems sometimes occur. Call your health care provider if you have any problems or questions after your procedure. WHAT TO EXPECT AFTER THE PROCEDURE After your procedure, it is typical to have the following:   Discomfort at the port insertion site. Ice packs to the area will help.  Bruising on the skin over the port. This will subside in 3-4 days. HOME CARE INSTRUCTIONS  After your port is placed, you will get a manufacturer's information card. The card has information about your port. Keep this card with you at all times.   Know what kind of port you have. There are many types of ports available.   Wear a medical alert bracelet in case of an emergency. This can help alert health care workers that you have a port.   The port can stay in for as long as your health care provider believes it is necessary.   A home health care nurse may give medicines and take care of the port.   You or a family member can get special training and directions for giving medicine and taking care of the port at home.  SEEK MEDICAL CARE IF:   Your port does not flush or you are unable to get a blood return.   You have a fever or chills. SEEK IMMEDIATE MEDICAL CARE IF:  You have new fluid or pus coming from your incision.   You notice a bad smell coming from your incision site.   You have swelling, pain, or more redness at the incision or port site.   You have chest pain or shortness of breath. Document Released: 01/07/2013 Document Revised: 03/24/2013 Document Reviewed: 01/07/2013 [facility] Patient Information 2015 [place], [place]. This information is not intended to replace  advice given to you by your health care provider. Make sure you discuss any questions you have with your health care provider.  May remove dressings and shower or bathe 24 to 48 hours post procedure.  Implanted [facility] Guide An implanted port is a type of central line that is placed under the skin. Central lines are used to provide IV access when treatment or nutrition needs to be given through a person's veins. Implanted ports are used for long-term IV access. An implanted port may be placed because:   You need IV medicine that would be irritating to the small veins in your hands or arms.   You need long-term IV medicines, such as antibiotics.   You need IV nutrition for a long period.   You need frequent blood draws for lab tests.   You need dialysis.  Implanted ports are usually placed in the chest area, but they can also be placed in the upper arm, the abdomen, or the leg. An implanted port has two main parts:   Reservoir. The reservoir is round and will appear as a small, raised area under your skin. The reservoir is the part where a needle is inserted to give medicines or draw blood.   Catheter. The catheter is a thin, flexible tube that extends from the reservoir. The catheter is placed into a large vein. Medicine that is inserted into the reservoir goes into the catheter and then into the vein.  HOW WILL  I CARE FOR MY INCISION SITE? Do not get the incision site wet. Bathe or shower as directed by your health care provider.  HOW IS MY PORT ACCESSED? Special steps must be taken to access the port:   Before the port is accessed, a numbing cream can be placed on the skin. This helps numb the skin over the port site.   Your health care provider uses a sterile technique to access the port.  Your health care provider must put on a mask and sterile gloves.  The skin over your port is cleaned carefully with an antiseptic and allowed to dry.  The port is gently pinched  between sterile gloves, and a needle is inserted into the port.  Only "non-coring" port needles should be used to access the port. Once the port is accessed, a blood return should be checked. This helps ensure that the port is in the vein and is not clogged.   If your port needs to remain accessed for a constant infusion, a clear (transparent) bandage will be placed over the needle site. The bandage and needle will need to be changed every week, or as directed by your health care provider.   Keep the bandage covering the needle clean and dry. Do not get it wet. Follow your health care provider's instructions on how to take a shower or bath while the port is accessed.   If your port does not need to stay accessed, no bandage is needed over the port.  WHAT IS FLUSHING? Flushing helps keep the port from getting clogged. Follow your health care provider's instructions on how and when to flush the port. Ports are usually flushed with saline solution or a medicine called heparin. The need for flushing will depend on how the port is used.   If the port is used for intermittent medicines or blood draws, the port will need to be flushed:   After medicines have been given.   After blood has been drawn.   As part of routine maintenance.   If a constant infusion is running, the port may not need to be flushed.  HOW LONG WILL MY PORT STAY IMPLANTED? The port can stay in for as long as your health care provider thinks it is needed. When it is time for the port to come out, surgery will be done to remove it. The procedure is similar to the one performed when the port was put in.  WHEN SHOULD I SEEK IMMEDIATE MEDICAL CARE? When you have an implanted port, you should seek immediate medical care if:   You notice a bad smell coming from the incision site.   You have swelling, redness, or drainage at the incision site.   You have more swelling or pain at the port site or the surrounding area.    You have a fever that is not controlled with medicine. Document Released: 03/19/2005 Document Revised: 01/07/2013 Document Reviewed: 11/24/2012 [facility] Patient Information 2015 [place], [place]. This information is not intended to replace advice given to you by your health care provider. Make sure you discuss any questions you have with your health care provider. Conscious Sedation Sedation is the use of medicines to promote relaxation and relieve discomfort and anxiety. Conscious sedation is a type of sedation. Under conscious sedation you are less alert than normal but are still able to respond to instructions or stimulation. Conscious sedation is used during short medical and dental procedures. It is milder than deep sedation or general anesthesia and  allows you to return to your regular activities sooner.  LET [facility] CARE PROVIDER KNOW ABOUT:   Any allergies you have.  All medicines you are taking, including vitamins, herbs, eye drops, creams, and over-the-counter medicines.  Use of steroids (by mouth or creams).  Previous problems you or members of your family have had with the use of anesthetics.  Any blood disorders you have.  Previous surgeries you have had.  Medical conditions you have.  Possibility of pregnancy, if this applies.  Use of cigarettes, alcohol, or illegal drugs. RISKS AND COMPLICATIONS Generally, this is a safe procedure. However, as with any procedure, problems can occur. Possible problems include:  Oversedation.  Trouble breathing on your own. You may need to have a breathing tube until you are awake and breathing on your own.  Allergic reaction to any of the medicines used for the procedure. BEFORE THE PROCEDURE  You may have blood tests done. These tests can help show how well your kidneys and liver are working. They can also show how well your blood clots.  A physical exam will be done.  Only take medicines as directed by your health care  provider. You may need to stop taking medicines (such as blood thinners, aspirin, or nonsteroidal anti-inflammatory drugs) before the procedure.   Do not eat or drink at least 6 hours before the procedure or as directed by your health care provider.  Arrange for a responsible adult, family member, or friend to take you home after the procedure. He or she should stay with you for at least 24 hours after the procedure, until the medicine has worn off. PROCEDURE   An intravenous (IV) catheter will be inserted into one of your veins. Medicine will be able to flow directly into your body through this catheter. You may be given medicine through this tube to help prevent pain and help you relax.  The medical or dental procedure will be done. AFTER THE PROCEDURE  You will stay in a recovery area until the medicine has worn off. Your blood pressure and pulse will be checked.   Depending on the procedure you had, you may be allowed to go home when you can tolerate liquids and your pain is under control. Document Released: 12/12/2000 Document Revised: 03/24/2013 Document Reviewed: 11/24/2012 [facility] Patient Information 2015 [place], [place]. This information is not intended to replace advice given to you by your health care provider. Make sure you discuss any questions you have with your health care provider. Conscious Sedation, Adult, Care After Refer to this sheet in the next few weeks. These instructions provide you with information on caring for yourself after your procedure. Your health care provider may also give you more specific instructions. Your treatment has been planned according to current medical practices, but problems sometimes occur. Call your health care provider if you have any problems or questions after your procedure. WHAT TO EXPECT AFTER THE PROCEDURE  After your procedure:  You may feel sleepy, clumsy, and have poor balance for several hours.  Vomiting may occur if you eat too  soon after the procedure. HOME CARE INSTRUCTIONS  Do not participate in any activities where you could become injured for at least 24 hours. Do not:  Drive.  Swim.  Ride a bicycle.  Operate heavy machinery.  Cook.  Use power tools.  Climb ladders.  Work from a high place.  Do not make important decisions or sign legal documents until you are improved.  If you vomit, drink  water, juice, or soup when you can drink without vomiting. Make sure you have little or no nausea before eating solid foods.  Only take over-the-counter or prescription medicines for pain, discomfort, or fever as directed by your health care provider.  Make sure you and your family fully understand everything about the medicines given to you, including what side effects may occur.  You should not drink alcohol, take sleeping pills, or take medicines that cause drowsiness for at least 24 hours.  If you smoke, do not smoke without supervision.  If you are feeling better, you may resume normal activities 24 hours after you were sedated.  Keep all appointments with your health care provider. SEEK MEDICAL CARE IF:  Your skin is pale or bluish in color.  You continue to feel nauseous or vomit.  Your pain is getting worse and is not helped by medicine.  You have bleeding or swelling.  You are still sleepy or feeling clumsy after 24 hours. SEEK IMMEDIATE MEDICAL CARE IF:  You develop a rash.  You have difficulty breathing.  You develop any type of allergic problem.  You have a fever. MAKE SURE YOU:  Understand these instructions.  Will watch your condition.  Will get help right away if you are not doing well or get worse. Document Released: 01/07/2013 Document Reviewed: 01/07/2013 [facility] Patient Information 2015 [place], [place]. This information is not intended to replace advice given to you by your health care provider. Make sure you discuss any questions you have with your health care  provider.

## 2015-01-03 NOTE — Procedures (Signed)
Successful placement of right IJ approach port-a-cath with tip at the superior caval atrial junction. The catheter is ready for immediate use. No immediate post procedural complications.  Jay Gem Conkle, MD Pager #: [phone]   

## 2015-01-03 NOTE — Progress Notes (Signed)
Patient ID: Tiffany [name], female   DOB: 1924-08-29, 81 y.o.   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 She has hx of recently diagnosed metastatic transitional cell carcinoma of right renal pelvis and presents today for port a cath placement for chemotherapy. She denies recent fevers, chills, HA, CP, dyspnea, cough, abd/back pain, hematuria/dysuria or N/V. She has noticed bloody stools.   Allergies: Nitrofurantoin; Indomethacin; and Lisinopril  Medications: Prior to Admission medications   Medication Sig Start Date End Date Taking? Authorizing Provider  acetaminophen (TYLENOL) 650 MG CR tablet Take 1,300 mg by mouth 2 (two) times daily.   Yes Historical Provider, MD  amLODipine (NORVASC) 5 MG tablet Take 5 mg by mouth daily.   Yes Historical Provider, MD  estradiol (ESTRACE) 0.5 MG tablet Take 0.5 mg by mouth daily. 08/02/14  Yes Historical Provider, MD  lidocaine (XYLOCAINE) 2 % jelly Place 1 application into the urethra 4 (four) times daily as needed. For urethral pain Patient taking differently: Place 1 application into the urethra 4 (four) times daily as needed (urethral pain).  11/16/14  Yes [name], MD  magnesium hydroxide (PHILLIPS CHEWS) 311 MG CHEW chewable tablet Chew 311 mg by mouth every 4 (four) hours as needed (for constipation).   Yes Historical Provider, MD  Menthol-Methyl Salicylate (MUSCLE RUB) 10-15 % CREA Apply 1 application topically as needed for muscle pain. Apply to knees   Yes Historical Provider, MD  ondansetron (ZOFRAN) 4 MG tablet Take 1 tablet (4 mg total) by mouth every 8 (eight) hours as needed for nausea or vomiting. 11/30/14  Yes [name], MD  OVER THE COUNTER MEDICATION Take 1 tablet by mouth at bedtime. [place] dream over the counter sleep aid   Yes  Historical Provider, MD  oxybutynin (DITROPAN) 5 MG tablet Take 0.5 tablets (2.5 mg total) by mouth 2 (two) times daily. 12/07/14  Yes [name], MD  oxyCODONE (OXY IR/ROXICODONE) 5 MG immediate release tablet Take 0.5-1 tablets (2.5-5 mg total) by mouth every 4 (four) hours as needed for moderate pain, severe pain or breakthrough pain. 11/30/14  Yes [name], MD  senna-docusate (SENNA S) 8.6-50 MG per tablet Take 2 tablets by mouth at bedtime. 11/30/14  Yes [name], MD  triamcinolone ointment (KENALOG) 0.5 % Apply 1 application topically 2 (two) times daily. 12/24/14  Yes [name], MD  vancomycin (VANCOCIN) 125 MG capsule Take 1 capsule (125 mg total) by mouth 4 (four) times daily. 12/24/14 01/03/15 Yes [place], MD     Vital Signs: BP 127/58 mmHg  Pulse 82  Temp(Src) 98.3 F (36.8 C) (Oral)  Resp 16  SpO2 96%  Physical Exam  Constitutional: She is oriented to person, place, and time. She appears well-developed and well-nourished.  Cardiovascular: Normal rate and regular rhythm.   Pulmonary/Chest: Effort normal and breath sounds normal.  Abdominal: Soft. Bowel sounds are normal. There is no tenderness.  Musculoskeletal: Normal range of motion. She exhibits edema.  Neurological: She is alert and oriented to person, place, and time.    Imaging: No results found.  Labs:  CBC:  Recent Labs  11/30/14 1007 12/07/14 1230 12/24/14 1234 01/03/15 1000  WBC 5.5 4.6 4.7 6.5  HGB 9.3* 9.6* 9.6* 9.7*  HCT 30.2* 31.2* 30.7* 30.6*  PLT 471* 403* 362 334  COAGS:  Recent Labs  11/22/14 0845 01/03/15 1000  INR 1.22 1.09  APTT 35  --     BMP:  Recent Labs  10/16/14 1611 10/17/14 0353  11/30/14 1008 12/03/14 1227 12/07/14 1231 12/24/14 1234  NA 135 135  < > 136 135* 137 140  K 4.5 3.5  < > 5.3* 4.5 4.4 4.1  CL 101 101  --   --   --   --   --   CO2 24 24  < > 25 22 25 23   GLUCOSE 174* 130*  < > 85 132 131 139  BUN 21*  20  < > 14.5 13.9 12.7 12.4  CALCIUM 9.8 8.9  < > 9.8 9.4 9.5 9.0  CREATININE 1.52* 1.25*  < > 1.2* 1.1 1.1 1.0  GFRNONAA 29* 37*  --   --   --   --   --   GFRAA 34* 43*  --   --   --   --   --   < > = values in this interval not displayed.  LIVER FUNCTION TESTS:  Recent Labs  11/16/14 1502 11/30/14 1008 12/07/14 1231 12/24/14 1234  BILITOT 0.36 0.44 0.42 0.57  AST 21 17 18 20   ALT 13 8 7 12   ALKPHOS 86 88 92 74  PROT 8.2 7.6 7.4 7.3  ALBUMIN 3.2* 2.9* 2.9* 3.2*    Assessment and Plan: Pt with hx metastatic TCC right renal pelvis, poor venous access, bloody stools. Plan is for port a cath placement today for chemotherapy. Risks and benefits discussed with the patient/husband including, but not limited to bleeding, infection, pneumothorax, or fibrin sheath development and need for additional procedures.All of the patient's questions were answered, patient is agreeable to proceed.Consent signed and in chart. Pt has made Dr. Irene Limbo aware of bloody stools at prior visit and will call his office tomorrow if problem persists. Hgb stable today. 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     Signed: D. Rowe Robert 01/03/2015, 11:07 AM   I spent a total of 15 minutes at the the patient's bedside AND on the patient's hospital floor or unit, greater than 50% of which was counseling/coordinating care for port a cath placement

## 2015-01-05 ENCOUNTER — Other Ambulatory Visit: Payer: Self-pay | Admitting: *Deleted

## 2015-01-05 ENCOUNTER — Telehealth: Payer: Self-pay

## 2015-01-05 ENCOUNTER — Other Ambulatory Visit: Payer: Self-pay | Admitting: Hematology

## 2015-01-05 DIAGNOSIS — K51219 Ulcerative (chronic) proctitis with unspecified complications: Secondary | ICD-10-CM

## 2015-01-05 DIAGNOSIS — K625 Hemorrhage of anus and rectum: Secondary | ICD-10-CM

## 2015-01-05 MED ORDER — BUDESONIDE 3 MG PO CPEP: 6.0000 mg | ORAL_CAPSULE | Freq: Every day | ORAL | Status: DC

## 2015-01-05 NOTE — Telephone Encounter (Signed)
Tiffany [name] doses not know why Dr. Irene Limbo sent the prescription in for the budesonide. It costs ~$106.00.   Told her that this medication helps with GI inflammation.  He has made referral to [place] but wants to see if this medication will help with her symptoms. Dr. Irene Limbo did order the generic form of the medication. She will pick up the medication.  She understands that she is taking 2 tablets for the first week to get the medication in her system and then decrease to 1 tablet daily. Told her to call Dr. Grier Mitts nurse if she has not heard about a GI appointment by ~01-11-15.  Tiffany [name] verbalized understanding.

## 2015-01-10 ENCOUNTER — Telehealth: Payer: Self-pay | Admitting: *Deleted

## 2015-01-10 NOTE — Telephone Encounter (Signed)
Contacted [facility] requesting records sent to [facility] per referral note: 01/05/15.

## 2015-01-14 ENCOUNTER — Other Ambulatory Visit (HOSPITAL_BASED_OUTPATIENT_CLINIC_OR_DEPARTMENT_OTHER): Payer: Medicare Other

## 2015-01-14 ENCOUNTER — Ambulatory Visit: Payer: Medicare Other

## 2015-01-14 ENCOUNTER — Encounter: Payer: Self-pay | Admitting: Hematology

## 2015-01-14 ENCOUNTER — Telehealth: Payer: Self-pay | Admitting: Hematology

## 2015-01-14 ENCOUNTER — Ambulatory Visit (HOSPITAL_BASED_OUTPATIENT_CLINIC_OR_DEPARTMENT_OTHER): Payer: Medicare Other | Admitting: Hematology

## 2015-01-14 ENCOUNTER — Ambulatory Visit (HOSPITAL_BASED_OUTPATIENT_CLINIC_OR_DEPARTMENT_OTHER): Payer: Medicare Other

## 2015-01-14 ENCOUNTER — Other Ambulatory Visit: Payer: Self-pay | Admitting: *Deleted

## 2015-01-14 VITALS — BP 145/58 | HR 92 | Temp 98.0°F | Resp 18 | Wt 132.3 lb

## 2015-01-14 DIAGNOSIS — C787 Secondary malignant neoplasm of liver and intrahepatic bile duct: Secondary | ICD-10-CM

## 2015-01-14 DIAGNOSIS — F32A Depression, unspecified: Secondary | ICD-10-CM

## 2015-01-14 DIAGNOSIS — Z5112 Encounter for antineoplastic immunotherapy: Secondary | ICD-10-CM | POA: Diagnosis not present

## 2015-01-14 DIAGNOSIS — C651 Malignant neoplasm of right renal pelvis: Secondary | ICD-10-CM

## 2015-01-14 DIAGNOSIS — R6889 Other general symptoms and signs: Secondary | ICD-10-CM

## 2015-01-14 DIAGNOSIS — C649 Malignant neoplasm of unspecified kidney, except renal pelvis: Secondary | ICD-10-CM

## 2015-01-14 DIAGNOSIS — Z95828 Presence of other vascular implants and grafts: Secondary | ICD-10-CM

## 2015-01-14 DIAGNOSIS — C7951 Secondary malignant neoplasm of bone: Secondary | ICD-10-CM

## 2015-01-14 DIAGNOSIS — C778 Secondary and unspecified malignant neoplasm of lymph nodes of multiple regions: Secondary | ICD-10-CM

## 2015-01-14 DIAGNOSIS — R630 Anorexia: Secondary | ICD-10-CM

## 2015-01-14 DIAGNOSIS — C7800 Secondary malignant neoplasm of unspecified lung: Secondary | ICD-10-CM | POA: Diagnosis not present

## 2015-01-14 DIAGNOSIS — C659 Malignant neoplasm of unspecified renal pelvis: Secondary | ICD-10-CM

## 2015-01-14 DIAGNOSIS — Z79899 Other long term (current) drug therapy: Secondary | ICD-10-CM

## 2015-01-14 DIAGNOSIS — D649 Anemia, unspecified: Secondary | ICD-10-CM

## 2015-01-14 DIAGNOSIS — F329 Major depressive disorder, single episode, unspecified: Secondary | ICD-10-CM

## 2015-01-14 LAB — COMPREHENSIVE METABOLIC PANEL (CC13)
AST: 13 U/L (ref 5–34)
Albumin: 2.7 g/dL — ABNORMAL LOW (ref 3.5–5.0)
Alkaline Phosphatase: 49 U/L (ref 40–150)
Anion Gap: 10 mEq/L (ref 3–11)
BUN: 10.1 mg/dL (ref 7.0–26.0)
CHLORIDE: 105 meq/L (ref 98–109)
CO2: 22 mEq/L (ref 22–29)
Calcium: 9.1 mg/dL (ref 8.4–10.4)
Creatinine: 0.8 mg/dL (ref 0.6–1.1)
EGFR: 69 mL/min/{1.73_m2} — ABNORMAL LOW (ref >90)
GLUCOSE: 108 mg/dL (ref 70–140)
POTASSIUM: 4.2 meq/L (ref 3.5–5.1)
SODIUM: 137 meq/L (ref 136–145)
Total Bilirubin: 0.44 mg/dL (ref 0.20–1.20)
Total Protein: 6.6 g/dL (ref 6.4–8.3)

## 2015-01-14 LAB — CBC WITH DIFFERENTIAL/PLATELET
BASO%: 0.3 % (ref 0.0–2.0)
BASOS ABS: 0 10*3/uL (ref 0.0–0.1)
EOS%: 1.5 % (ref 0.0–7.0)
Eosinophils Absolute: 0.1 10*3/uL (ref 0.0–0.5)
HCT: 28.3 % — ABNORMAL LOW (ref 34.8–46.6)
HEMOGLOBIN: 9.2 g/dL — AB (ref 11.6–15.9)
LYMPH%: 20.1 % (ref 14.0–49.7)
MCH: 28.1 pg (ref 25.1–34.0)
MCHC: 32.5 g/dL (ref 31.5–36.0)
MCV: 86.2 fL (ref 79.5–101.0)
MONO#: 1.3 10*3/uL — ABNORMAL HIGH (ref 0.1–0.9)
MONO%: 17 % — AB (ref 0.0–14.0)
NEUT#: 4.7 10*3/uL (ref 1.5–6.5)
NEUT%: 61.1 % (ref 38.4–76.8)
Platelets: 447 10*3/uL — ABNORMAL HIGH (ref 145–400)
RBC: 3.28 10*6/uL — ABNORMAL LOW (ref 3.70–5.45)
RDW: 20.4 % — AB (ref 11.2–14.5)
WBC: 7.8 10*3/uL (ref 3.9–10.3)
lymph#: 1.6 10*3/uL (ref 0.9–3.3)

## 2015-01-14 LAB — T4, FREE: Free T4: 0.88 ng/dL (ref 0.80–1.80)

## 2015-01-14 LAB — TSH CHCC: TSH: 0.659 m(IU)/L (ref 0.308–3.960)

## 2015-01-14 MED ORDER — HEPARIN SOD (PORK) LOCK FLUSH 100 UNIT/ML IV SOLN
500.0000 [IU] | Freq: Once | INTRAVENOUS | Status: AC
  Administered 2015-01-14: 500 [IU] via INTRAVENOUS
  Filled 2015-01-14: qty 5

## 2015-01-14 MED ORDER — HEPARIN SOD (PORK) LOCK FLUSH 100 UNIT/ML IV SOLN
500.0000 [IU] | Freq: Once | INTRAVENOUS | Status: AC | PRN
  Administered 2015-01-14: 500 [IU]
  Filled 2015-01-14: qty 5

## 2015-01-14 MED ORDER — SODIUM CHLORIDE 0.9 % IJ SOLN
10.0000 mL | INTRAMUSCULAR | Status: DC | PRN
  Administered 2015-01-14: 10 mL via INTRAVENOUS
  Filled 2015-01-14: qty 10

## 2015-01-14 MED ORDER — SODIUM CHLORIDE 0.9 % IV SOLN
Freq: Once | INTRAVENOUS | Status: AC
  Administered 2015-01-14: 14:00:00 via INTRAVENOUS

## 2015-01-14 MED ORDER — MIRTAZAPINE 15 MG PO TABS: 15.0000 mg | ORAL_TABLET | Freq: Every day | ORAL | Status: DC

## 2015-01-14 MED ORDER — SODIUM CHLORIDE 0.9 % IJ SOLN
10.0000 mL | INTRAMUSCULAR | Status: DC | PRN
  Administered 2015-01-14: 10 mL
  Filled 2015-01-14: qty 10

## 2015-01-14 MED ORDER — SODIUM CHLORIDE 0.9 % IV SOLN
1200.0000 mg | Freq: Once | INTRAVENOUS | Status: AC
  Administered 2015-01-14: 1200 mg via INTRAVENOUS
  Filled 2015-01-14: qty 20

## 2015-01-14 NOTE — Progress Notes (Signed)
RN scheduled patient appointment with Dr. Hassell Done at [facility] 01/20/15 @ 330pm

## 2015-01-14 NOTE — Telephone Encounter (Signed)
Gave adn printed appt sched and avs for pt NOV.Tiffany [name] patient is est pt at [facility] she will sched her appt

## 2015-01-16 NOTE — Progress Notes (Signed)
Hematology oncology clinic follow-up.  Date of service 11/30/2014  Patient Care Team: [name], MD as PCP - General (Internal Medicine)   Urologist:Dr. 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   HISTORY OF PRESENTING ILLNESS: Please see my previous note for details of initial presentation.  INTERVAL HISTORY  [name] is here for for her for her scheduled follow-up prior to her third cycle of Atezolizumab. She notes that her dysuria or issues of nearly resolved. Her skin rashes are much improved with the triamcinolone ointment. She was not really scheduled for the dermatology referral was placed. She notes that her GI appointment has not been scheduled yet. Her diarrhea and rectal bleeding has improved with the Entocort. I have informed my nurses and the scheduler to get her into see Dr. Earle Gell her GI doctor as soon as possible for a sigmoidoscopy/colonoscopy. Note some generalized body aches. She was encouraged to use her pain meds as needed which she has been using very sparingly. Her husband is accompanying her today. We discussed that she needs reimaging for assessment of treatment response prior to her next cycle of treatment. No fevers or chills. No night sweats. Notes some weight loss. Was encouraged to eat better. Given a prescription for Remeron to help sleep better and to help with appetite.  MEDICAL HISTORY:  Past Medical History  Diagnosis Date  . Hypertension   . Edema leg     feet and ankles  . Headache(784.0)   . Esophagus disorder     Had esophagus stretched  . Arthritis      knees 03-10-12 had Cortisone injection  . Occipital neuralgia   . Ulcerative proctitis ([place])   . DDD (degenerative disc disease), cervical   . GERD (gastroesophageal reflux disease)     Benign stricture dilated in 2011  . Occipital neuralgia     Related to  cervical degenerative disc disease  . Gout     Patient notes she has possible gout but has not been on chronic medications for this  . Peptic ulcer disease     Previous history in the remote past  . Ulcerative proctitis ([place]) 2014  . Cancer [facility]) june 2016    metastatic    SURGICAL HISTORY: Past Surgical History  Procedure Laterality Date  . Cholecystectomy    . Appendectomy    . Abdominal hysterectomy    . Dilation and curettage of uterus    . Neck surgery      20 years ago  . Esophagogastroduodenoscopy    . Colonoscopy with propofol  03/25/2012    Procedure: COLONOSCOPY WITH PROPOFOL;  Surgeon: Garlan Fair, MD;  Location: [facility];  Service: Endoscopy;  Laterality: N/A;  . Flexible sigmoidoscopy N/A 04/26/2014    Procedure: FLEXIBLE SIGMOIDOSCOPY - UnSedated;  Surgeon: Garlan Fair, MD;  Location: [facility] ENDOSCOPY;  Service: Endoscopy;  Laterality: N/A;  . Esophageal dilation      For benign stricture in 2011  . Total abdominal hysterectomy w/ bilateral salpingoophorectomy      At age 59 due to miscarriage and retained products of conception causing significant uterine bleeding. Patient has been on estrogen replacement therapy since then.    SOCIAL HISTORY: Social History   Social History  . Marital Status: Married    Spouse Name: N/A  . Number of Children: N/A  . Years of Education: N/A   Occupational History  . Not  on file.   Social History Main Topics  . Smoking status: Former Smoker    Quit date: 11/01/1983  . Smokeless tobacco: Never Used  . Alcohol Use: Yes  . Drug Use: No  . Sexual Activity: Not on file   Other Topics Concern  . Not on file   Social History Narrative  Retired as Conservation officer, nature for [employer]. She is very well spoken and intelligent individual and exhibits a keen knowledge of her medical conditions.  FAMILY HISTORY: Family History  Problem Relation Age of Onset  . Lung cancer Sister   . Prostate cancer  Brother   . Uterine cancer Maternal Aunt   . Breast cancer Paternal Grandmother   . Hodgkin's lymphoma Sister     ALLERGIES:  is allergic to nitrofurantoin; indomethacin; and lisinopril.  MEDICATIONS:  Current Outpatient Prescriptions  Medication Sig Dispense Refill  . acetaminophen (TYLENOL) 650 MG CR tablet Take 1,300 mg by mouth 2 (two) times daily.    Marland Kitchen amLODipine (NORVASC) 5 MG tablet Take 5 mg by mouth daily.    . budesonide (ENTOCORT EC) 3 MG 24 hr capsule Take 2 capsules (6 mg total) by mouth daily. For 1 week then reduce to 3mg  po daily 30 capsule 0  . estradiol (ESTRACE) 0.5 MG tablet Take 0.5 mg by mouth daily.  1  . magnesium hydroxide (PHILLIPS CHEWS) 311 MG CHEW chewable tablet Chew 311 mg by mouth every 4 (four) hours as needed (for constipation).    . Menthol-Methyl Salicylate (MUSCLE RUB) 10-15 % CREA Apply 1 application topically as needed for muscle pain. Apply to knees    . mirtazapine (REMERON) 15 MG tablet Take 1 tablet (15 mg total) by mouth at bedtime. 30 tablet 2  . ondansetron (ZOFRAN) 4 MG tablet Take 1 tablet (4 mg total) by mouth every 8 (eight) hours as needed for nausea or vomiting. 30 tablet 0  . OVER THE COUNTER MEDICATION Apply 1 application topically. [place] dream over the counter cream for arthritis    . oxyCODONE (OXY IR/ROXICODONE) 5 MG immediate release tablet Take 0.5-1 tablets (2.5-5 mg total) by mouth every 4 (four) hours as needed for moderate pain, severe pain or breakthrough pain. 60 tablet 0  . senna-docusate (SENNA S) 8.6-50 MG per tablet Take 2 tablets by mouth at bedtime. 60 tablet 1  . triamcinolone ointment (KENALOG) 0.5 % Apply 1 application topically 2 (two) times daily. 30 g 0   No current facility-administered medications for this visit.   REVIEW OF SYSTEMS:    10 point review of systems done and is negative except as noted above  PHYSICAL EXAMINATION: ECOG PERFORMANCE STATUS: 1  Filed Vitals:   01/14/15 1256  BP: 145/58    Pulse: 92  Temp: 98 F (36.7 C)  Resp: 18   Filed Weights   01/14/15 1256  Weight: 132 lb 5 oz (60.017 kg)   GENERAL elderly Caucasian female, alert, no distress and comfortable SKIN: Macular reddish colored spotty rash over upper extremities and one spot on the face. Mildly pruritic nonpainful. EYES: normal, conjunctiva are pink and non-injected, sclera clear OROPHARYNX:no exudate, no erythema and lips, buccal mucosa, and tongue normal  NECK: supple, thyroid normal size, non-tender, without nodularity LYMPH:  no palpable lymphadenopathy in the cervical, axillary or inguinal LUNGS: clear to auscultation and percussion with normal breathing effort HEART: regular rate & rhythm and no murmurs and no lower extremity edema ABDOMEN:abdomen soft, non-tender and normal bowel sounds Musculoskeletal:no cyanosis of  digits and no clubbing  PSYCH: alert & oriented x 3 with fluent speech NEURO: no focal motor/sensory deficits  LABORATORY DATA:  . CBC Latest Ref Rng 01/14/2015 01/03/2015 12/24/2014  WBC 3.9 - 10.3 10e3/uL 7.8 6.5 4.7  Hemoglobin 11.6 - 15.9 g/dL 9.2(L) 9.7(L) 9.6(L)  Hematocrit 34.8 - 46.6 % 28.3(L) 30.6(L) 30.7(L)  Platelets 145 - 400 10e3/uL 447(H) 334 362      Recent Labs  10/16/14 1611 10/17/14 0353  12/07/14 1231 12/24/14 1234 01/14/15 1053  NA 135 135  < > 137 140 137  K 4.5 3.5  < > 4.4 4.1 4.2  CL 101 101  --   --   --   --   CO2 24 24  < > 25 23 22   GLUCOSE 174* 130*  < > 131 139 108  BUN 21* 20  < > 12.7 12.4 10.1  CREATININE 1.52* 1.25*  < > 1.1 1.0 0.8  CALCIUM 9.8 8.9  < > 9.5 9.0 9.1  GFRNONAA 29* 37*  --   --   --   --   GFRAA 34* 43*  --   --   --   --   PROT  --   --   < > 7.4 7.3 6.6  ALBUMIN  --   --   < > 2.9* 3.2* 2.7*  AST  --   --   < > 18 20 13   ALT  --   --   < > 7 12 <9  ALKPHOS  --   --   < > 92 74 49  BILITOT  --   --   < > 0.42 0.57 0.44  < > = values in this interval not displayed.    RADIOGRAPHIC STUDIES:  PET/CT  11/24/2014:  IMPRESSION: 1. Multi focal hypermetabolism within nodal stations, pulmonary nodules, bones and liver. Most consistent with metastatic disease versus less likely lymphoma. 2. Hypermetabolism corresponding to soft tissue fullness in the right renal pelvis. This is suspicious for transitional cell carcinoma (possibly the primary). Infiltrative renal cell carcinoma or lymphoma felt less likely.     ASSESSMENT & PLAN:   81 year old Caucasian female with  #1 Right Renal pelvis metastatic transitional cell carcinoma.  PET scan reveals metastases to the lungs, liver, multiple nodal stations and bones. Her creatinine to 1.5 and GFR around 30 would likely make the patient cisplatin ineligible.  #2 Urethral pains and bladder spasms/ discomfort. Urine cultures negative. Symptoms have much improved and are nearly resolved at this time.  #3 skin rash likely related to her Atezolizumab. Was grade 1 and is now much improved and nearly resolved with topical triamcinolone.  #4 poor IV access for labs and medication administration agreeable to report   #5  Mucous and slight blood in stools. No diarrhea C. difficile negative. He has history of inflammatory proctitis which likely is clearing up. No significant abdominal pain or discomfort. Her rectal bleeding and diarrhea are improving with oral budesonide. Hemoglobin relatively stable with no significant drop suggesting lack of significant bleeding. Plan  -Labs stable and patient is appropriate to receive third cycle of Atezolizumab. Today. -Encouraged patient to use her pain medications more actively if needed for her body aches and generalized pains. -Continue Xgeva every 6 weeks -Continue triamcinolone ointment for her skin rash  -Port placement done uneventfully. -Urgent GI referral to see Dr. Earle Gell or other available provider for sigmoidoscopy/colonoscopy to evaluate her rectal bleeding. -Repeat PET/CT scan prior to her next  cycle of treatment. -Will plan to treat the patient with 1200mg  Q3weeks until treatment progression or residual toxicities.  #6 Normocytic normochromic anemia likely due to anemia of chronic disease from her metastatic malignancy and chronic kidney disease. -No indication of blood transfusion at this time  -We will monitor and treat with when necessary blood transfusions if symptomatic. -Monitoring GI bleeding and urgent GI referral given which needs to be scheduled ASAP  #7 Urethral discomfort -much improved Viscous lidocaine jelly prescribed for when necessary use. -When necessary oxycodone  #8 history of hypertension patient's blood pressure is now lower given associated weight loss. Plan Off amlodipine and hydrochlorothiazide monitor blood pressure. It is well controlled today with no concerns.  I appreciate the privilege of taking care of this wonderful patient .  All questions were answered. The patient knows to call the clinic with any problems, questions or concerns. I spent 20 minutes counseling the patient face to face. The total time spent in the appointment was 25 minutes and more than 50% was on counseling.   Sullivan Lone MD [place] [facility] Phys[facility]  (Office):       [phone] (Work cell):  [phone] (Fax):           [phone]

## 2015-01-20 ENCOUNTER — Telehealth: Payer: Self-pay | Admitting: Hematology

## 2015-01-20 ENCOUNTER — Other Ambulatory Visit: Payer: Self-pay | Admitting: Gastroenterology

## 2015-01-20 NOTE — Telephone Encounter (Signed)
per pof to sch pt referrals-cld and sch pt w/Dr [place] for 11/21 @10 -sent [place] email to fax med rec-cld Dr Earle Gell @ [facility] left Anderson Malta his scheduler 2 messages to call me adv to call pt to sch appt for referral by Dr Irene Limbo for rectal bleeding-adv pt was est pt of Dr Worthy Flank to call me back to adv when appt set up

## 2015-01-20 NOTE — Telephone Encounter (Signed)
sent Tiffany in [facility] email to fax Dr Denna Haggard med rec to [phone]

## 2015-01-21 ENCOUNTER — Telehealth: Payer: Self-pay | Admitting: Hematology

## 2015-01-21 NOTE — Telephone Encounter (Signed)
Faxed medical records to Dr. Denna Haggard @ [phone]

## 2015-01-24 ENCOUNTER — Encounter (HOSPITAL_COMMUNITY): Payer: Self-pay

## 2015-01-24 ENCOUNTER — Encounter (HOSPITAL_COMMUNITY)
Admission: RE | Disposition: A | Discharge status: Home / Self Care | Payer: Self-pay | Source: Ambulatory Visit | Attending: Gastroenterology

## 2015-01-24 ENCOUNTER — Telehealth: Payer: Self-pay | Admitting: *Deleted

## 2015-01-24 ENCOUNTER — Ambulatory Visit (HOSPITAL_COMMUNITY)
Admission: RE | Admit: 2015-01-24 | Discharge: 2015-01-24 | Disposition: A | Discharge status: Home / Self Care | Payer: Medicare Other | Source: Ambulatory Visit | Attending: Gastroenterology | Admitting: Gastroenterology

## 2015-01-24 DIAGNOSIS — K5120 Ulcerative (chronic) proctitis without complications: Secondary | ICD-10-CM | POA: Diagnosis not present

## 2015-01-24 DIAGNOSIS — C799 Secondary malignant neoplasm of unspecified site: Secondary | ICD-10-CM | POA: Insufficient documentation

## 2015-01-24 DIAGNOSIS — Z8719 Personal history of other diseases of the digestive system: Secondary | ICD-10-CM | POA: Insufficient documentation

## 2015-01-24 DIAGNOSIS — I10 Essential (primary) hypertension: Secondary | ICD-10-CM | POA: Insufficient documentation

## 2015-01-24 DIAGNOSIS — M1990 Unspecified osteoarthritis, unspecified site: Secondary | ICD-10-CM | POA: Insufficient documentation

## 2015-01-24 DIAGNOSIS — C649 Malignant neoplasm of unspecified kidney, except renal pelvis: Secondary | ICD-10-CM | POA: Insufficient documentation

## 2015-01-24 DIAGNOSIS — K921 Melena: Secondary | ICD-10-CM | POA: Diagnosis present

## 2015-01-24 HISTORY — PX: FLEXIBLE SIGMOIDOSCOPY: SHX5431

## 2015-01-24 SURGERY — SIGMOIDOSCOPY, FLEXIBLE
Anesthesia: Moderate Sedation

## 2015-01-24 MED ORDER — FENTANYL CITRATE (PF) 100 MCG/2ML IJ SOLN
INTRAMUSCULAR | Status: AC
  Filled 2015-01-24: qty 2

## 2015-01-24 MED ORDER — FLEET ENEMA 7-19 GM/118ML RE ENEM
2.0000 | ENEMA | Freq: Once | RECTAL | Status: AC
  Administered 2015-01-24: 2 via RECTAL

## 2015-01-24 MED ORDER — FLEET ENEMA 7-19 GM/118ML RE ENEM
ENEMA | RECTAL | Status: AC
  Filled 2015-01-24: qty 2

## 2015-01-24 MED ORDER — MIDAZOLAM HCL 5 MG/ML IJ SOLN
INTRAMUSCULAR | Status: AC
  Filled 2015-01-24: qty 2

## 2015-01-24 MED ORDER — HEPARIN SOD (PORK) LOCK FLUSH 100 UNIT/ML IV SOLN
500.0000 [IU] | INTRAVENOUS | Status: AC | PRN
  Administered 2015-01-24: 500 [IU]

## 2015-01-24 NOTE — Discharge Instructions (Signed)
Flexible Sigmoidoscopy, Care After  Refer to this sheet in the next few weeks. These instructions provide you with information on caring for yourself after your procedure. Your health care provider may also give you more specific instructions. Your treatment has been planned according to current medical practices, but problems sometimes occur. Call your health care provider if you have any problems or questions after your procedure.  WHAT TO EXPECT AFTER THE PROCEDURE  After your procedure, it is typical to have the following:   · Abdominal cramps.  · Bloating.  · A small amount of rectal bleeding if you had a biopsy.  HOME CARE INSTRUCTIONS  · Only take over-the-counter or prescription medicines for pain, fever, or discomfort as directed by your health care provider.  · Resume your normal diet and activities as directed by your health care provider.  SEEK MEDICAL CARE IF:  · You have abdominal pain or cramping that lasts longer than 1 hour after the procedure.  · You continue to have small amounts of rectal bleeding after 24 hours.  · You have nausea or vomiting.  · You feel weak or dizzy.  SEEK IMMEDIATE MEDICAL CARE IF:   · You have a fever.  · You pass large blood clots or see a large amount of blood in the toilet after having a bowel movement. This may also occur 10-14 days after the procedure. It is more likely if you had a biopsy.  · You develop abdominal pain that is not relieved with medicine or your abdominal pain gets worse.  · You have nausea or vomiting for more than 24 hours after the procedure.     This information is not intended to replace advice given to you by your health care provider. Make sure you discuss any questions you have with your health care provider.     Document Released: 03/24/2013 Document Reviewed: 03/24/2013  Elsevier Interactive Patient Education ©2016 [employer] Inc[employer]

## 2015-01-24 NOTE — Op Note (Signed)
Procedure: Diagnostic proctoscopy. Resolving hematochezia. Metastatic transitional cell carcinoma of the renal pelvis. January 2016 normal diagnostic flexible proctosigmoidoscopy. 10/24/2011 colonoscopy performed to evaluate hematochezia: Extensive left colonic diverticulosis and generalized ulcerative proctitis diagnosed.  Endoscopist: Earle Gell  Premedication: None  Procedure: The patient was placed in the left lateral decubitus position. Anal inspection and digital rectal exam were normal. The Pentax gastroscope was introduced into the rectum and advanced to approximately 15 cm from the anal verge. Mild-moderate generalized ulcerative proctitis characterized by friable mucosa without aphthous or deep ulcers was present.  Assessment: Generalized ulcerative proctitis  Recommendation: Mesalamine suppositories (1000 milligram): Insert 1 into the rectum at bedtime nightly. #30 prescribed. Refill 12 times.

## 2015-01-24 NOTE — Telephone Encounter (Signed)
RN spoke with patient. Patient appointment with [facility] was 01/20/15. Patient is scheduled for colonoscopy 01/24/15.

## 2015-01-24 NOTE — H&P (Signed)
  Problem: Resolving hematochezia. Metastatic transitional cell carcinoma of the renal pelvis. January 2016 normal diagnostic flexible proctosigmoidoscopy performed to evaluate hematochezia. 10/24/2011 colonoscopy performed to evaluate hematochezia: generalized proctitis and extensive left colonic diverticulosis diagnosed  History: The patient is an 81 year old female born 08-19-1924. 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 Diagnostic flexible proctosigmoidoscopy performed January 2016 was normal.  The patient is receiving chemotherapy at the cancer center. Her oncologist requests a diagnostic flexible proctosigmoidoscopy to evaluate painless hematochezia.  The patient requests moderate sedation for this procedure.  Medication allergies: Lisinopril causes tongue swelling  Past medical history: Hypertension. Osteoarthritis. 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 Ulcerative proctitis. Cervical radiculopathy. Total abdominal hysterectomy with BSO. Cholecystectomy. Cervical spine surgery. Appendectomy.  Exam: The patient is alert and lying comfortably on the endoscopy stretcher. Abdomen is soft and nontender to palpation. Lungs are clear to auscultation. Cardiac exam reveals a regular rhythm.  Plan: Proceed with diagnostic flexible proctosigmoidoscopy to evaluate painless hematochezia.

## 2015-01-25 ENCOUNTER — Encounter (HOSPITAL_COMMUNITY): Payer: Self-pay | Admitting: Gastroenterology

## 2015-01-28 ENCOUNTER — Ambulatory Visit (HOSPITAL_COMMUNITY)
Admission: RE | Admit: 2015-01-28 | Discharge: 2015-01-28 | Disposition: A | Discharge status: Home / Self Care | Payer: Medicare Other | Source: Ambulatory Visit | Attending: Hematology | Admitting: Hematology

## 2015-01-28 DIAGNOSIS — C659 Malignant neoplasm of unspecified renal pelvis: Secondary | ICD-10-CM | POA: Insufficient documentation

## 2015-01-28 DIAGNOSIS — I2510 Atherosclerotic heart disease of native coronary artery without angina pectoris: Secondary | ICD-10-CM | POA: Diagnosis not present

## 2015-01-28 DIAGNOSIS — C7800 Secondary malignant neoplasm of unspecified lung: Secondary | ICD-10-CM | POA: Insufficient documentation

## 2015-01-28 DIAGNOSIS — C787 Secondary malignant neoplasm of liver and intrahepatic bile duct: Secondary | ICD-10-CM | POA: Diagnosis not present

## 2015-01-28 DIAGNOSIS — C7951 Secondary malignant neoplasm of bone: Secondary | ICD-10-CM | POA: Insufficient documentation

## 2015-01-28 DIAGNOSIS — J439 Emphysema, unspecified: Secondary | ICD-10-CM | POA: Insufficient documentation

## 2015-01-28 LAB — GLUCOSE, CAPILLARY: GLUCOSE-CAPILLARY: 94 mg/dL (ref 65–99)

## 2015-01-28 MED ORDER — FLUDEOXYGLUCOSE F - 18 (FDG) INJECTION
6.8000 | Freq: Once | INTRAVENOUS | Status: DC | PRN
Start: 2015-01-28 — End: 2015-02-03

## 2015-02-03 ENCOUNTER — Other Ambulatory Visit: Payer: Self-pay | Admitting: Hematology

## 2015-02-04 ENCOUNTER — Other Ambulatory Visit (HOSPITAL_BASED_OUTPATIENT_CLINIC_OR_DEPARTMENT_OTHER): Payer: Medicare Other

## 2015-02-04 ENCOUNTER — Telehealth: Payer: Self-pay | Admitting: Hematology

## 2015-02-04 ENCOUNTER — Ambulatory Visit (HOSPITAL_BASED_OUTPATIENT_CLINIC_OR_DEPARTMENT_OTHER): Payer: Medicare Other | Admitting: Hematology

## 2015-02-04 ENCOUNTER — Encounter: Payer: Self-pay | Admitting: Hematology

## 2015-02-04 ENCOUNTER — Ambulatory Visit (HOSPITAL_BASED_OUTPATIENT_CLINIC_OR_DEPARTMENT_OTHER): Payer: Medicare Other

## 2015-02-04 VITALS — BP 144/55 | HR 68 | Temp 98.6°F | Resp 17 | Ht 61.0 in | Wt 132.9 lb

## 2015-02-04 DIAGNOSIS — C7951 Secondary malignant neoplasm of bone: Secondary | ICD-10-CM

## 2015-02-04 DIAGNOSIS — C787 Secondary malignant neoplasm of liver and intrahepatic bile duct: Secondary | ICD-10-CM

## 2015-02-04 DIAGNOSIS — C651 Malignant neoplasm of right renal pelvis: Secondary | ICD-10-CM

## 2015-02-04 DIAGNOSIS — C778 Secondary and unspecified malignant neoplasm of lymph nodes of multiple regions: Secondary | ICD-10-CM | POA: Diagnosis not present

## 2015-02-04 DIAGNOSIS — Z5112 Encounter for antineoplastic immunotherapy: Secondary | ICD-10-CM

## 2015-02-04 DIAGNOSIS — R6889 Other general symptoms and signs: Secondary | ICD-10-CM

## 2015-02-04 DIAGNOSIS — K859 Acute pancreatitis, unspecified: Secondary | ICD-10-CM

## 2015-02-04 DIAGNOSIS — C7800 Secondary malignant neoplasm of unspecified lung: Secondary | ICD-10-CM

## 2015-02-04 DIAGNOSIS — C659 Malignant neoplasm of unspecified renal pelvis: Secondary | ICD-10-CM

## 2015-02-04 DIAGNOSIS — D649 Anemia, unspecified: Secondary | ICD-10-CM

## 2015-02-04 DIAGNOSIS — Z79899 Other long term (current) drug therapy: Secondary | ICD-10-CM

## 2015-02-04 DIAGNOSIS — M7989 Other specified soft tissue disorders: Secondary | ICD-10-CM

## 2015-02-04 LAB — CBC & DIFF AND RETIC
BASO%: 0.2 % (ref 0.0–2.0)
BASOS ABS: 0 10*3/uL (ref 0.0–0.1)
EOS%: 1.2 % (ref 0.0–7.0)
Eosinophils Absolute: 0.1 10*3/uL (ref 0.0–0.5)
HEMATOCRIT: 28.6 % — AB (ref 34.8–46.6)
HGB: 9 g/dL — ABNORMAL LOW (ref 11.6–15.9)
Immature Retic Fract: 12.9 % — ABNORMAL HIGH (ref 1.60–10.00)
LYMPH%: 34.1 % (ref 14.0–49.7)
MCH: 28.7 pg (ref 25.1–34.0)
MCHC: 31.5 g/dL (ref 31.5–36.0)
MCV: 91.1 fL (ref 79.5–101.0)
MONO#: 0.8 10*3/uL (ref 0.1–0.9)
MONO%: 19 % — AB (ref 0.0–14.0)
NEUT#: 2 10*3/uL (ref 1.5–6.5)
NEUT%: 45.5 % (ref 38.4–76.8)
Platelets: 307 10*3/uL (ref 145–400)
RBC: 3.14 10*6/uL — ABNORMAL LOW (ref 3.70–5.45)
RDW: 18.9 % — AB (ref 11.2–14.5)
RETIC %: 2.3 % — AB (ref 0.70–2.10)
Retic Ct Abs: 72.22 10*3/uL (ref 33.70–90.70)
WBC: 4.3 10*3/uL (ref 3.9–10.3)
lymph#: 1.5 10*3/uL (ref 0.9–3.3)

## 2015-02-04 LAB — COMPREHENSIVE METABOLIC PANEL (CC13)
ALT: <9 U/L (ref 0–55)
ANION GAP: 7 meq/L (ref 3–11)
AST: 16 U/L (ref 5–34)
Albumin: 2.7 g/dL — ABNORMAL LOW (ref 3.5–5.0)
Alkaline Phosphatase: 44 U/L (ref 40–150)
BUN: 12 mg/dL (ref 7.0–26.0)
CALCIUM: 8.7 mg/dL (ref 8.4–10.4)
CHLORIDE: 111 meq/L — AB (ref 98–109)
CO2: 21 mEq/L — ABNORMAL LOW (ref 22–29)
Creatinine: 0.8 mg/dL (ref 0.6–1.1)
EGFR: 64 mL/min/{1.73_m2} — ABNORMAL LOW (ref >90)
Glucose: 118 mg/dl (ref 70–140)
POTASSIUM: 4.1 meq/L (ref 3.5–5.1)
Sodium: 139 mEq/L (ref 136–145)
Total Bilirubin: 0.34 mg/dL (ref 0.20–1.20)
Total Protein: 6.2 g/dL — ABNORMAL LOW (ref 6.4–8.3)

## 2015-02-04 LAB — AMYLASE: AMYLASE: 54 U/L (ref 0–105)

## 2015-02-04 LAB — LIPASE: Lipase: 311 U/L — ABNORMAL HIGH (ref 7–60)

## 2015-02-04 LAB — TSH CHCC: TSH: 1.635 m[IU]/L (ref 0.308–3.960)

## 2015-02-04 LAB — T4, FREE: FREE T4: 0.69 ng/dL — AB (ref 0.80–1.80)

## 2015-02-04 MED ORDER — SODIUM CHLORIDE 0.9 % IJ SOLN
10.0000 mL | INTRAMUSCULAR | Status: DC | PRN
  Administered 2015-02-04: 10 mL
  Filled 2015-02-04: qty 10

## 2015-02-04 MED ORDER — SODIUM CHLORIDE 0.9 % IV SOLN
1200.0000 mg | Freq: Once | INTRAVENOUS | Status: AC
  Administered 2015-02-04: 1200 mg via INTRAVENOUS
  Filled 2015-02-04: qty 20

## 2015-02-04 MED ORDER — SODIUM CHLORIDE 0.9 % IV SOLN
Freq: Once | INTRAVENOUS | Status: AC
  Administered 2015-02-04: 11:00:00 via INTRAVENOUS

## 2015-02-04 MED ORDER — HEPARIN SOD (PORK) LOCK FLUSH 100 UNIT/ML IV SOLN
500.0000 [IU] | Freq: Once | INTRAVENOUS | Status: AC | PRN
  Administered 2015-02-04: 500 [IU]
  Filled 2015-02-04: qty 5

## 2015-02-04 MED ORDER — DENOSUMAB 120 MG/1.7ML ~~LOC~~ SOLN
120.0000 mg | Freq: Once | SUBCUTANEOUS | Status: AC
  Administered 2015-02-04: 120 mg via SUBCUTANEOUS
  Filled 2015-02-04: qty 1.7

## 2015-02-04 NOTE — Patient Instructions (Addendum)
[facility] Discharge Instructions for Patients Receiving Chemotherapy  Today you received the following Immunotherapy;  Tecentriq (Azetolizumab)  BELOW ARE SYMPTOMS THAT SHOULD BE REPORTED IMMEDIATELY:  *FEVER GREATER THAN 100.5 F  *CHILLS WITH OR WITHOUT FEVER  NAUSEA AND VOMITING THAT IS NOT CONTROLLED WITH YOUR NAUSEA MEDICATION  *UNUSUAL SHORTNESS OF BREATH  *UNUSUAL BRUISING OR BLEEDING  TENDERNESS IN MOUTH AND THROAT WITH OR WITHOUT PRESENCE OF ULCERS  *URINARY PROBLEMS  *BOWEL PROBLEMS  UNUSUAL RASH Items with * indicate a potential emergency and should be followed up as soon as possible.  Feel free to call the clinic you have any questions or concerns. The clinic phone number is [phone].  Please show the [place] at check-in to the Emergency Department and triage nurse.  Denosumab injection What is this medicine? DENOSUMAB (den oh sue mab) slows bone breakdown. Prolia is used to treat osteoporosis in women after menopause and in men. Delton See is used to prevent bone fractures and other bone problems caused by cancer bone metastases. Delton See is also used to treat giant cell tumor of the bone. This medicine may be used for other purposes; ask your health care provider or pharmacist if you have questions. What should I tell my health care provider before I take this medicine? They need to know if you have any of these conditions: -dental disease -eczema -infection or history of infections -kidney disease or on dialysis -low blood calcium or vitamin D -malabsorption syndrome -scheduled to have surgery or tooth extraction -taking medicine that contains denosumab -thyroid or parathyroid disease -an unusual reaction to denosumab, other medicines, foods, dyes, or preservatives -pregnant or trying to get pregnant -breast-feeding How should I use this medicine? This medicine is for injection under the skin. It is given by a health care  professional in a hospital or clinic setting. If you are getting Prolia, a special MedGuide will be given to you by the pharmacist with each prescription and refill. Be sure to read this information carefully each time. For Prolia, talk to your pediatrician regarding the use of this medicine in children. Special care may be needed. For Delton See, talk to your pediatrician regarding the use of this medicine in children. While this drug may be prescribed for children as young as 13 years for selected conditions, precautions do apply. Overdosage: If you think you have taken too much of this medicine contact a poison control center or emergency room at once. NOTE: This medicine is only for you. Do not share this medicine with others. What if I miss a dose? It is important not to miss your dose. Call your doctor or health care professional if you are unable to keep an appointment. What may interact with this medicine? Do not take this medicine with any of the following medications: -other medicines containing denosumab This medicine may also interact with the following medications: -medicines that suppress the immune system -medicines that treat cancer -steroid medicines like prednisone or cortisone This list may not describe all possible interactions. Give your health care provider a list of all the medicines, herbs, non-prescription drugs, or dietary supplements you use. Also tell them if you smoke, drink alcohol, or use illegal drugs. Some items may interact with your medicine. What should I watch for while using this medicine? Visit your doctor or health care professional for regular checks on your progress. Your doctor or health care professional may order blood tests and other tests to see how you are doing. Call  your doctor or health care professional if you get a cold or other infection while receiving this medicine. Do not treat yourself. This medicine may decrease your body's ability to fight  infection. You should make sure you get enough calcium and vitamin D while you are taking this medicine, unless your doctor tells you not to. Discuss the foods you eat and the vitamins you take with your health care professional. See your dentist regularly. Brush and floss your teeth as directed. Before you have any dental work done, tell your dentist you are receiving this medicine. Do not become pregnant while taking this medicine or for 5 months after stopping it. Women should inform their doctor if they wish to become pregnant or think they might be pregnant. There is a potential for serious side effects to an unborn child. Talk to your health care professional or pharmacist for more information. What side effects may I notice from receiving this medicine? Side effects that you should report to your doctor or health care professional as soon as possible: -allergic reactions like skin rash, itching or hives, swelling of the face, lips, or tongue -breathing problems -chest pain -fast, irregular heartbeat -feeling faint or lightheaded, falls -fever, chills, or any other sign of infection -muscle spasms, tightening, or twitches -numbness or tingling -skin blisters or bumps, or is dry, peels, or red -slow healing or unexplained pain in the mouth or jaw -unusual bleeding or bruising Side effects that usually do not require medical attention (Report these to your doctor or health care professional if they continue or are bothersome.): -muscle pain -stomach upset, gas This list may not describe all possible side effects. Call your doctor for medical advice about side effects. You may report side effects to FDA at 1-800-FDA-1088. Where should I keep my medicine? This medicine is only given in a clinic, doctor's office, or other health care setting and will not be stored at home. NOTE: This sheet is a summary. It may not cover all possible information. If you have questions about this medicine, talk  to your doctor, pharmacist, or health care provider.    2016, Elsevier/Gold Standard. (2011-09-17 12:37:47)

## 2015-02-04 NOTE — Telephone Encounter (Signed)
per pof to sch pt appt-cld & left pt appt time & date on voicemail

## 2015-02-05 NOTE — Progress Notes (Signed)
Hematology oncology clinic follow-up.  Date of service .02/04/2015   Patient Care Team: [name], MD as PCP - General (Internal Medicine)   Urologist:Dr. 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  Diagnosis:  Widely metastatic transitional cell carcinoma from the right renal pelvis.  Treatment -Atezolizumab IV q3weeks.  HISTORY OF PRESENTING ILLNESS: Please see my previous note for details of initial presentation.  INTERVAL HISTORY  [name] is here for for her for her scheduled follow-up prior to her fourth cycle of Atezolizumab.  She is in much better spirits today. Notes that her antidepressant really helped her. Is going to church regularly again. Was seen by her GI doctor and had a sigmoidoscopy on 01/24/2015. 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 She was recommended mesalamine suppositories daily which she is tolerating well. Between the Entocort in the mesalamine suppositories her rectal bleeding has resolved. She has no diarrhea at this time. There was some uptake in the pancreas on the PET/CT scan today but she has no epigastric discomfort or back pain. She had some elevation of her lipase levels but amylase levels are within normal limits. No clinical evidence of pancreatitis. Her PET CT scan showed excellent response to treatment with significant resolution of all her metastases from her metastatic transitional cell carcinoma. She is quite glad with this result. No other acute new symptoms at this time. Skin rashes have resolved with topical steroids. All her previous dysuria has resolved.  MEDICAL HISTORY:  Past Medical History  Diagnosis Date  . Hypertension   . Edema leg     feet and ankles  . Headache(784.0)   . Esophagus disorder     Had esophagus stretched  . Arthritis      knees 03-10-12 had  Cortisone injection  . Occipital neuralgia   . Ulcerative proctitis ([place])   . DDD (degenerative disc disease), cervical   . GERD (gastroesophageal reflux disease)     Benign stricture dilated in 2011  . Occipital neuralgia     Related to cervical degenerative disc disease  . Gout     Patient notes she has possible gout but has not been on chronic medications for this  . Peptic ulcer disease     Previous history in the remote past  . Ulcerative proctitis ([place]) 2014  . Cancer [facility]) june 2016    metastatic    SURGICAL HISTORY: Past Surgical History  Procedure Laterality Date  . Cholecystectomy    . Appendectomy    . Abdominal hysterectomy    . Dilation and curettage of uterus    . Neck surgery      20 years ago  . Esophagogastroduodenoscopy    . Colonoscopy with propofol  03/25/2012    Procedure: COLONOSCOPY WITH PROPOFOL;  Surgeon: Garlan Fair, MD;  Location: [facility];  Service: Endoscopy;  Laterality: N/A;  . Flexible sigmoidoscopy N/A 04/26/2014    Procedure: FLEXIBLE SIGMOIDOSCOPY - UnSedated;  Surgeon: Garlan Fair, MD;  Location: [facility];  Service: Endoscopy;  Laterality: N/A;  . Esophageal dilation      For benign stricture in 2011  . Total abdominal hysterectomy w/ bilateral salpingoophorectomy      At age 37 due to miscarriage and retained products of conception causing significant uterine bleeding. Patient has been on estrogen replacement therapy since then.  . Flexible sigmoidoscopy N/A 01/24/2015    Procedure:  FLEXIBLE SIGNMOIDOSCOPY W/ FLEET ENEMIA;  Surgeon: Garlan Fair, MD;  Location: [facility];  Service: Endoscopy;  Laterality: N/A;    SOCIAL HISTORY: Social History   Social History  . Marital Status: Married    Spouse Name: N/A  . Number of Children: N/A  . Years of Education: N/A   Occupational History  . Not on file.   Social History Main Topics  . Smoking status: Former Smoker    Quit date: 11/01/1983  . Smokeless  tobacco: Never Used  . Alcohol Use: Yes  . Drug Use: No  . Sexual Activity: Not on file   Other Topics Concern  . Not on file   Social History Narrative  Retired as Conservation officer, nature for [employer]. She is very well spoken and intelligent individual and exhibits a keen knowledge of her medical conditions.  FAMILY HISTORY: Family History  Problem Relation Age of Onset  . Lung cancer Sister   . Prostate cancer Brother   . Uterine cancer Maternal Aunt   . Breast cancer Paternal Grandmother   . Hodgkin's lymphoma Sister     ALLERGIES:  is allergic to nitrofurantoin; indomethacin; and lisinopril.  MEDICATIONS:  Current Outpatient Prescriptions  Medication Sig Dispense Refill  . acetaminophen (TYLENOL) 650 MG CR tablet Take 1,300 mg by mouth 2 (two) times daily.    Marland Kitchen amLODipine (NORVASC) 5 MG tablet Take 5 mg by mouth daily.    . budesonide (ENTOCORT EC) 3 MG 24 hr capsule Take 2 capsules (6 mg total) by mouth daily. For 1 week then reduce to 3mg  po daily 30 capsule 0  . CANASA 1000 MG suppository   11  . estradiol (ESTRACE) 0.5 MG tablet Take 0.5 mg by mouth daily.  1  . magnesium hydroxide (PHILLIPS CHEWS) 311 MG CHEW chewable tablet Chew 311 mg by mouth every 4 (four) hours as needed (for constipation).    . Menthol-Methyl Salicylate (MUSCLE RUB) 10-15 % CREA Apply 1 application topically as needed for muscle pain. Apply to knees    . mirtazapine (REMERON) 15 MG tablet Take 1 tablet (15 mg total) by mouth at bedtime. 30 tablet 2  . ondansetron (ZOFRAN) 4 MG tablet Take 1 tablet (4 mg total) by mouth every 8 (eight) hours as needed for nausea or vomiting. 30 tablet 0  . OVER THE COUNTER MEDICATION Apply 1 application topically. [place] dream over the counter cream for arthritis    . oxyCODONE (OXY IR/ROXICODONE) 5 MG immediate release tablet Take 0.5-1 tablets (2.5-5 mg total) by mouth every 4 (four) hours as needed for moderate pain, severe pain or breakthrough  pain. 60 tablet 0  . senna-docusate (SENNA S) 8.6-50 MG per tablet Take 2 tablets by mouth at bedtime. 60 tablet 1  . triamcinolone ointment (KENALOG) 0.5 % Apply 1 application topically 2 (two) times daily. 30 g 0   No current facility-administered medications for this visit.   REVIEW OF SYSTEMS:    10 point review of systems done and is negative except as noted above  PHYSICAL EXAMINATION: ECOG PERFORMANCE STATUS: 1  Filed Vitals:   02/04/15 0954  BP: 144/55  Pulse: 68  Temp: 98.6 F (37 C)  Resp: 17   Filed Weights   02/04/15 0954  Weight: 132 lb 14.4 oz (60.283 kg)   GENERAL elderly Caucasian female, alert, no distress and comfortable SKIN: Macular reddish colored spotty rash over upper extremities and one spot on the face. Mildly pruritic nonpainful. EYES: normal, conjunctiva  are pink and non-injected, sclera clear OROPHARYNX:no exudate, no erythema and lips, buccal mucosa, and tongue normal  NECK: supple, thyroid normal size, non-tender, without nodularity LYMPH:  no palpable lymphadenopathy in the cervical, axillary or inguinal LUNGS: clear to auscultation and percussion with normal breathing effort HEART: regular rate & rhythm and no murmurs and no lower extremity edema ABDOMEN:abdomen soft, non-tender and normal bowel sounds Musculoskeletal:no cyanosis of digits and no clubbing  PSYCH: alert & oriented x 3 with fluent speech NEURO: no focal motor/sensory deficits  LABORATORY DATA:  . CBC Latest Ref Rng 02/04/2015 01/14/2015 01/03/2015  WBC 3.9 - 10.3 10e3/uL 4.3 7.8 6.5  Hemoglobin 11.6 - 15.9 g/dL 9.0(L) 9.2(L) 9.7(L)  Hematocrit 34.8 - 46.6 % 28.6(L) 28.3(L) 30.6(L)  Platelets 145 - 400 10e3/uL 307 447(H) 334      Recent Labs  10/16/14 1611 10/17/14 0353  12/24/14 1234 01/14/15 1053 02/04/15 0929  NA 135 135  < > 140 137 139  K 4.5 3.5  < > 4.1 4.2 4.1  CL 101 101  --   --   --   --   CO2 24 24  < > 23 22 21*  GLUCOSE 174* 130*  < > 139 108 118    BUN 21* 20  < > 12.4 10.1 12.0  CREATININE 1.52* 1.25*  < > 1.0 0.8 0.8  CALCIUM 9.8 8.9  < > 9.0 9.1 8.7  GFRNONAA 29* 37*  --   --   --   --   GFRAA 34* 43*  --   --   --   --   PROT  --   --   < > 7.3 6.6 6.2*  ALBUMIN  --   --   < > 3.2* 2.7* 2.7*  AST  --   --   < > 20 13 16   ALT  --   --   < > 12 <9 <9  ALKPHOS  --   --   < > 74 49 44  BILITOT  --   --   < > 0.57 0.44 0.34  < > = values in this interval not displayed.    RADIOGRAPHIC STUDIES:  PET/CT 11/24/2014:  IMPRESSION: 1. Multi focal hypermetabolism within nodal stations, pulmonary nodules, bones and liver. Most consistent with metastatic disease versus less likely lymphoma. 2. Hypermetabolism corresponding to soft tissue fullness in the right renal pelvis. This is suspicious for transitional cell carcinoma (possibly the primary). Infiltrative renal cell carcinoma or lymphoma felt less likely.   PET/CT 01/28/2015: IMPRESSION: 1. Interval significant response to therapy. The widespread hypermetabolic metastatic disease involving the liver, lungs and bones has nearly completely resolved, without residual abnormal metabolic activity. There are persistent morphologic changes within the liver and bones, probably related to treated disease. 2. New diffuse pancreatic activity. This is probably a manifestation of pancreatitis, possibly secondary to Atezolizumab adverse reaction. Laboratory correlation recommended. 3. Decreased right renal function, likely related to collecting system obstruction from the known transitional cell carcinoma.   ASSESSMENT & PLAN:   81 year old Caucasian female with  #1 Right Renal pelvis metastatic transitional cell carcinoma.  PET scan reveals metastases to the lungs, liver, multiple nodal stations and bones. Her creatinine to 1.5 and GFR around 30 would likely make the patient cisplatin ineligible. Renal function is improved. She is status post 3 cycles of Atezolizumab. PET scan  done prior to her clinic visit for restaging disease shows significant response to treatment as noted above.  #2 Urethral  pains and bladder spasms/ discomfort. Urine cultures negative. Symptoms have now completely resolved.   #3 skin rash likely related to her Atezolizumab. Was grade 1 and is now much improved and nearly resolved with topical triamcinolone.  #4 poor IV access status post port  #5  Mucous and slight blood in stools. No diarrhea C. difficile negative. Was seen by Dr. Hassell Done and had a sigmoidoscopy that showed recurrence of her ulcerative proctitis. However rectal bleeding and discomfort have resolved with the Entocort and the mesalamine suppositories that she has been started on by GI . She wants to hold off on the Entocort and only continue the suppositories at this time which is reasonable.  #6 signs of increased uptake in the pancreas with some mild lipase elevation normal amylase. No clinical evidence of pancreatitis but will need to be monitored closely.  Plan  -Labs stable and patient is appropriate to receive fourth cycle of Atezolizumab. Today. -Continue current pain management. -Continue Xgeva every 6 weeks -Continue triamcinolone ointment for her skin rash  -Continue daily mesalamine suppositories as per Dr. Hassell Done for ulcerative proctitis. -Will plan to treat the patient with 1200mg  Q3weeks until treatment progression or residual toxicities. -A repeat lipase levels prior to next treatment. -Patient has been counseled to seek immediate attention if she has increasing abdominal pain, GI bleeding or diarrhea, nausea vomiting.  #6 Normocytic normochromic anemia likely due to anemia of chronic disease from her metastatic malignancy and chronic kidney disease and some gi bleeding that  has now resolved  -No indication of blood transfusion at this time  -We will monitor and treat with when necessary blood transfusions if symptomatic.   #7 Urethral disconow resolvedus  lidocaine jelly prescribed for when necessary use. -When necessary oxycodone  #8 history of hypertension patient's blood pressure is now lower given associated weight loss. Plan Off amlodipine and hydrochlorothiazide monitor blood pressure. It is well controlled today with no concerns.  I appreciate the privilege of taking care of this wonderful patient .  All questions were answered. The patient knows to call the clinic with any problems, questions or concerns. I spent 25 minutes counseling the patient face to face. The total time spent in the appointment was 30 minutes and more than 50% was on counseling.   Sullivan Lone MD MS Hematology/Oncology Physician [facility]  (Office):       [phone] (Work cell):  [phone] (Fax):           [phone]    Before   After 3 cycles of IV Atezolizumab

## 2015-02-21 ENCOUNTER — Other Ambulatory Visit: Payer: Self-pay | Admitting: *Deleted

## 2015-02-21 DIAGNOSIS — C649 Malignant neoplasm of unspecified kidney, except renal pelvis: Secondary | ICD-10-CM

## 2015-02-21 MED ORDER — OXYCODONE HCL 5 MG PO TABS: 2.5000 mg | ORAL_TABLET | ORAL | Status: DC | PRN

## 2015-02-23 ENCOUNTER — Other Ambulatory Visit: Payer: Self-pay | Admitting: *Deleted

## 2015-02-23 DIAGNOSIS — C659 Malignant neoplasm of unspecified renal pelvis: Secondary | ICD-10-CM

## 2015-02-25 ENCOUNTER — Ambulatory Visit (HOSPITAL_BASED_OUTPATIENT_CLINIC_OR_DEPARTMENT_OTHER): Payer: Medicare Other | Admitting: Hematology

## 2015-02-25 ENCOUNTER — Ambulatory Visit: Payer: Medicare Other

## 2015-02-25 ENCOUNTER — Other Ambulatory Visit: Payer: Medicare Other

## 2015-02-25 ENCOUNTER — Encounter: Payer: Self-pay | Admitting: Hematology

## 2015-02-25 ENCOUNTER — Other Ambulatory Visit (HOSPITAL_BASED_OUTPATIENT_CLINIC_OR_DEPARTMENT_OTHER): Payer: Medicare Other

## 2015-02-25 ENCOUNTER — Ambulatory Visit (HOSPITAL_BASED_OUTPATIENT_CLINIC_OR_DEPARTMENT_OTHER): Payer: Medicare Other

## 2015-02-25 VITALS — BP 141/63 | HR 76 | Temp 98.3°F | Resp 18 | Ht 61.0 in | Wt 129.0 lb

## 2015-02-25 DIAGNOSIS — C7951 Secondary malignant neoplasm of bone: Secondary | ICD-10-CM

## 2015-02-25 DIAGNOSIS — C787 Secondary malignant neoplasm of liver and intrahepatic bile duct: Secondary | ICD-10-CM

## 2015-02-25 DIAGNOSIS — C659 Malignant neoplasm of unspecified renal pelvis: Secondary | ICD-10-CM

## 2015-02-25 DIAGNOSIS — C7800 Secondary malignant neoplasm of unspecified lung: Secondary | ICD-10-CM

## 2015-02-25 DIAGNOSIS — Z95828 Presence of other vascular implants and grafts: Secondary | ICD-10-CM

## 2015-02-25 DIAGNOSIS — Z5112 Encounter for antineoplastic immunotherapy: Secondary | ICD-10-CM | POA: Diagnosis not present

## 2015-02-25 DIAGNOSIS — C651 Malignant neoplasm of right renal pelvis: Secondary | ICD-10-CM

## 2015-02-25 DIAGNOSIS — C778 Secondary and unspecified malignant neoplasm of lymph nodes of multiple regions: Secondary | ICD-10-CM

## 2015-02-25 DIAGNOSIS — K859 Acute pancreatitis, unspecified: Secondary | ICD-10-CM

## 2015-02-25 DIAGNOSIS — M7989 Other specified soft tissue disorders: Secondary | ICD-10-CM

## 2015-02-25 DIAGNOSIS — D649 Anemia, unspecified: Secondary | ICD-10-CM

## 2015-02-25 DIAGNOSIS — I10 Essential (primary) hypertension: Secondary | ICD-10-CM

## 2015-02-25 LAB — COMPREHENSIVE METABOLIC PANEL (CC13)
ALBUMIN: 2.5 g/dL — AB (ref 3.5–5.0)
ALK PHOS: 35 U/L — AB (ref 40–150)
AST: 15 U/L (ref 5–34)
Anion Gap: 6 mEq/L (ref 3–11)
BILIRUBIN TOTAL: 0.33 mg/dL (ref 0.20–1.20)
BUN: 12 mg/dL (ref 7.0–26.0)
CO2: 22 meq/L (ref 22–29)
CREATININE: 0.7 mg/dL (ref 0.6–1.1)
Calcium: 7.9 mg/dL — ABNORMAL LOW (ref 8.4–10.4)
Chloride: 113 mEq/L — ABNORMAL HIGH (ref 98–109)
EGFR: 73 mL/min/{1.73_m2} — AB (ref >90)
GLUCOSE: 83 mg/dL (ref 70–140)
Potassium: 3.7 mEq/L (ref 3.5–5.1)
SODIUM: 140 meq/L (ref 136–145)
TOTAL PROTEIN: 5.4 g/dL — AB (ref 6.4–8.3)

## 2015-02-25 LAB — CBC WITH DIFFERENTIAL/PLATELET
BASO%: 0.8 % (ref 0.0–2.0)
BASOS ABS: 0 10*3/uL (ref 0.0–0.1)
EOS ABS: 0 10*3/uL (ref 0.0–0.5)
EOS%: 0.6 % (ref 0.0–7.0)
HEMATOCRIT: 28.6 % — AB (ref 34.8–46.6)
HEMOGLOBIN: 9.2 g/dL — AB (ref 11.6–15.9)
LYMPH#: 1.4 10*3/uL (ref 0.9–3.3)
LYMPH%: 32.2 % (ref 14.0–49.7)
MCH: 29.1 pg (ref 25.1–34.0)
MCHC: 32.2 g/dL (ref 31.5–36.0)
MCV: 90.4 fL (ref 79.5–101.0)
MONO#: 0.7 10*3/uL (ref 0.1–0.9)
MONO%: 15.7 % — ABNORMAL HIGH (ref 0.0–14.0)
NEUT%: 50.7 % (ref 38.4–76.8)
NEUTROS ABS: 2.2 10*3/uL (ref 1.5–6.5)
Platelets: 232 10*3/uL (ref 145–400)
RBC: 3.17 10*6/uL — ABNORMAL LOW (ref 3.70–5.45)
RDW: 18.5 % — AB (ref 11.2–14.5)
WBC: 4.3 10*3/uL (ref 3.9–10.3)

## 2015-02-25 LAB — LIPASE: Lipase: 117 U/L — ABNORMAL HIGH (ref 7–60)

## 2015-02-25 MED ORDER — SODIUM CHLORIDE 0.9 % IV SOLN
1200.0000 mg | Freq: Once | INTRAVENOUS | Status: AC
  Administered 2015-02-25: 1200 mg via INTRAVENOUS
  Filled 2015-02-25: qty 20

## 2015-02-25 MED ORDER — SODIUM CHLORIDE 0.9 % IV SOLN
Freq: Once | INTRAVENOUS | Status: AC
  Administered 2015-02-25: 11:00:00 via INTRAVENOUS

## 2015-02-25 MED ORDER — HEPARIN SOD (PORK) LOCK FLUSH 100 UNIT/ML IV SOLN
500.0000 [IU] | Freq: Once | INTRAVENOUS | Status: AC
  Administered 2015-02-25: 500 [IU] via INTRAVENOUS
  Filled 2015-02-25: qty 5

## 2015-02-25 MED ORDER — SODIUM CHLORIDE 0.9 % IJ SOLN
10.0000 mL | INTRAMUSCULAR | Status: DC | PRN
  Administered 2015-02-25: 10 mL
  Filled 2015-02-25: qty 10

## 2015-02-25 MED ORDER — SODIUM CHLORIDE 0.9 % IJ SOLN
10.0000 mL | INTRAMUSCULAR | Status: DC | PRN
  Administered 2015-02-25: 10 mL via INTRAVENOUS
  Filled 2015-02-25: qty 10

## 2015-02-25 MED ORDER — HEPARIN SOD (PORK) LOCK FLUSH 100 UNIT/ML IV SOLN
500.0000 [IU] | Freq: Once | INTRAVENOUS | Status: AC | PRN
  Administered 2015-02-25: 500 [IU]
  Filled 2015-02-25: qty 5

## 2015-02-25 NOTE — Patient Instructions (Signed)
[place] Discharge Instructions for Patients Receiving Chemotherapy  Today you received the following chemotherapy agents: Tecentriq  To help prevent nausea and vomiting after your treatment, we encourage you to take your nausea medication as prescribed by your physician.   If you develop nausea and vomiting that is not controlled by your nausea medication, call the clinic.   BELOW ARE SYMPTOMS THAT SHOULD BE REPORTED IMMEDIATELY:  *FEVER GREATER THAN 100.5 F  *CHILLS WITH OR WITHOUT FEVER  NAUSEA AND VOMITING THAT IS NOT CONTROLLED WITH YOUR NAUSEA MEDICATION  *UNUSUAL SHORTNESS OF BREATH  *UNUSUAL BRUISING OR BLEEDING  TENDERNESS IN MOUTH AND THROAT WITH OR WITHOUT PRESENCE OF ULCERS  *URINARY PROBLEMS  *BOWEL PROBLEMS  UNUSUAL RASH Items with * indicate a potential emergency and should be followed up as soon as possible.  Feel free to call the clinic you have any questions or concerns. The clinic phone number is [phone].  Please show the [place] at check-in to the Emergency Department and triage nurse.

## 2015-03-02 NOTE — Progress Notes (Signed)
Hematology oncology clinic follow-up.  Date of service .02/25/2015  Patient Care Team: [name], MD as PCP - General (Internal Medicine)   Urologist:Dr. 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  Diagnosis:  Widely metastatic transitional cell carcinoma from the right renal pelvis.  Treatment -Atezolizumab IV q3weeks.  HISTORY OF PRESENTING ILLNESS: Please see my previous note for details of initial presentation.  INTERVAL HISTORY  Ms Burleson is here for for her for her scheduled follow-up prior to her fifth cycle of Atezolizumab. No significant diarrhea. Minimal trace rectal bleeding. Continues to use 5ASA suppositories. No abdominal pain. Lipase level is improved. No new abdominal pain. No problems passing urine. No dysuria. No hematuria. No other acute concerns. Energy levels have been good. No significant new skin rashes.   MEDICAL HISTORY:  Past Medical History  Diagnosis Date  . Hypertension   . Edema leg     feet and ankles  . Headache(784.0)   . Esophagus disorder     Had esophagus stretched  . Arthritis      knees 03-10-12 had Cortisone injection  . Occipital neuralgia   . Ulcerative proctitis ([place])   . DDD (degenerative disc disease), cervical   . GERD (gastroesophageal reflux disease)     Benign stricture dilated in 2011  . Occipital neuralgia     Related to cervical degenerative disc disease  . Gout     Patient notes she has possible gout but has not been on chronic medications for this  . Peptic ulcer disease     Previous history in the remote past  . Ulcerative proctitis ([place]) 2014  . Cancer [facility]) june 2016    metastatic    SURGICAL HISTORY: Past Surgical History  Procedure Laterality Date  . Cholecystectomy    . Appendectomy    . Abdominal hysterectomy    . Dilation and curettage of uterus    . Neck surgery      20 years  ago  . Esophagogastroduodenoscopy    . Colonoscopy with propofol  03/25/2012    Procedure: COLONOSCOPY WITH PROPOFOL;  Surgeon: Garlan Fair, MD;  Location: [facility];  Service: Endoscopy;  Laterality: N/A;  . Flexible sigmoidoscopy N/A 04/26/2014    Procedure: FLEXIBLE SIGMOIDOSCOPY - UnSedated;  Surgeon: Garlan Fair, MD;  Location: [facility];  Service: Endoscopy;  Laterality: N/A;  . Esophageal dilation      For benign stricture in 2011  . Total abdominal hysterectomy w/ bilateral salpingoophorectomy      At age 85 due to miscarriage and retained products of conception causing significant uterine bleeding. Patient has been on estrogen replacement therapy since then.  . Flexible sigmoidoscopy N/A 01/24/2015    Procedure: FLEXIBLE SIGNMOIDOSCOPY W/ FLEET ENEMIA;  Surgeon: Garlan Fair, MD;  Location: [facility];  Service: Endoscopy;  Laterality: N/A;    SOCIAL HISTORY: Social History   Social History  . Marital Status: Married    Spouse Name: N/A  . Number of Children: N/A  . Years of Education: N/A   Occupational History  . Not on file.   Social History Main Topics  . Smoking status: Former Smoker    Quit date: 11/01/1983  . Smokeless tobacco: Never Used  . Alcohol Use: Yes  . Drug Use: No  . Sexual Activity: Not on file   Other Topics Concern  . 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 She is very well spoken and intelligent individual and exhibits a keen knowledge of her medical conditions.  FAMILY HISTORY: Family History  Problem Relation Age of Onset  . Lung cancer Sister   . Prostate cancer Brother   . Uterine cancer Maternal Aunt   . Breast cancer Paternal Grandmother   . Hodgkin's lymphoma Sister     ALLERGIES:  is allergic to nitrofurantoin; indomethacin; and lisinopril.  MEDICATIONS:  Current Outpatient Prescriptions  Medication Sig Dispense Refill  . acetaminophen (TYLENOL)  650 MG CR tablet Take 1,300 mg by mouth 2 (two) times daily.    Marland Kitchen amLODipine (NORVASC) 5 MG tablet Take 5 mg by mouth daily.    Marland Kitchen CANASA 1000 MG suppository   11  . estradiol (ESTRACE) 0.5 MG tablet Take 0.5 mg by mouth daily.  1  . magnesium hydroxide (PHILLIPS CHEWS) 311 MG CHEW chewable tablet Chew 311 mg by mouth every 4 (four) hours as needed (for constipation).    . mirtazapine (REMERON) 15 MG tablet Take 1 tablet (15 mg total) by mouth at bedtime. 30 tablet 2  . ondansetron (ZOFRAN) 4 MG tablet Take 1 tablet (4 mg total) by mouth every 8 (eight) hours as needed for nausea or vomiting. 30 tablet 0  . OVER THE COUNTER MEDICATION Apply 1 application topically. [place] dream over the counter cream for arthritis    . oxyCODONE (OXY IR/ROXICODONE) 5 MG immediate release tablet Take 0.5-1 tablets (2.5-5 mg total) by mouth every 4 (four) hours as needed for moderate pain, severe pain or breakthrough pain. 60 tablet 0  . senna-docusate (SENNA S) 8.6-50 MG per tablet Take 2 tablets by mouth at bedtime. 60 tablet 1  . triamcinolone ointment (KENALOG) 0.5 % Apply 1 application topically 2 (two) times daily. 30 g 0   No current facility-administered medications for this visit.   REVIEW OF SYSTEMS:    10 point review of systems done and is negative except as noted above  PHYSICAL EXAMINATION: ECOG PERFORMANCE STATUS: 1  Filed Vitals:   02/25/15 1024  BP: 141/63  Pulse: 76  Temp: 98.3 F (36.8 C)  Resp: 18   Filed Weights   02/25/15 1024  Weight: 129 lb (58.514 kg)   GENERAL elderly Caucasian female, alert, no distress and comfortable SKIN: Macular reddish colored spotty rash over upper extremities and one spot on the face. Mildly pruritic nonpainful. EYES: normal, conjunctiva are pink and non-injected, sclera clear OROPHARYNX:no exudate, no erythema and lips, buccal mucosa, and tongue normal  NECK: supple, thyroid normal size, non-tender, without nodularity LYMPH:  no palpable  lymphadenopathy in the cervical, axillary or inguinal LUNGS: clear to auscultation and percussion with normal breathing effort HEART: regular rate & rhythm and no murmurs and no lower extremity edema ABDOMEN:abdomen soft, non-tender and normal bowel sounds Musculoskeletal:no cyanosis of digits and no clubbing  PSYCH: alert & oriented x 3 with fluent speech NEURO: no focal motor/sensory deficits  LABORATORY DATA:  . CBC Latest Ref Rng 02/25/2015 02/04/2015 01/14/2015  WBC 3.9 - 10.3 10e3/uL 4.3 4.3 7.8  Hemoglobin 11.6 - 15.9 g/dL 9.2(L) 9.0(L) 9.2(L)  Hematocrit 34.8 - 46.6 % 28.6(L) 28.6(L) 28.3(L)  Platelets 145 - 400 10e3/uL 232 307 447(H)      Recent Labs  10/16/14 1611 10/17/14 0353  01/14/15 1053 02/04/15 0929 02/25/15 0916  NA 135 135  < > 137 139 140  K 4.5 3.5  < > 4.2 4.1 3.7  CL  101 101  --   --   --   --   CO2 24 24  < > 22 21* 22  GLUCOSE 174* 130*  < > 108 118 83  BUN 21* 20  < > 10.1 12.0 12.0  CREATININE 1.52* 1.25*  < > 0.8 0.8 0.7  CALCIUM 9.8 8.9  < > 9.1 8.7 7.9*  GFRNONAA 29* 37*  --   --   --   --   GFRAA 34* 43*  --   --   --   --   PROT  --   --   < > 6.6 6.2* 5.4*  ALBUMIN  --   --   < > 2.7* 2.7* 2.5*  AST  --   --   < > 13 16 15   ALT  --   --   < > <9 <9 <9  ALKPHOS  --   --   < > 49 44 35*  BILITOT  --   --   < > 0.44 0.34 0.33  < > = values in this interval not displayed.    RADIOGRAPHIC STUDIES:  PET/CT 11/24/2014:  IMPRESSION: 1. Multi focal hypermetabolism within nodal stations, pulmonary nodules, bones and liver. Most consistent with metastatic disease versus less likely lymphoma. 2. Hypermetabolism corresponding to soft tissue fullness in the right renal pelvis. This is suspicious for transitional cell carcinoma (possibly the primary). Infiltrative renal cell carcinoma or lymphoma felt less likely.   PET/CT 01/28/2015: IMPRESSION: 1. Interval significant response to therapy. The widespread hypermetabolic metastatic disease  involving the liver, lungs and bones has nearly completely resolved, without residual abnormal metabolic activity. There are persistent morphologic changes within the liver and bones, probably related to treated disease. 2. New diffuse pancreatic activity. This is probably a manifestation of pancreatitis, possibly secondary to Atezolizumab adverse reaction. Laboratory correlation recommended. 3. Decreased right renal function, likely related to collecting system obstruction from the known transitional cell carcinoma.   ASSESSMENT & PLAN:   81 year old Caucasian female with  #1 Right Renal pelvis metastatic transitional cell carcinoma.  PET scan reveals metastases to the lungs, liver, multiple nodal stations and bones. She is status post 4 cycles of Atezolizumab. PET scan done after 3 cycles of treatment for restaging disease showed significant response to treatment #2 Urethral pains and bladder spasms/ discomfort. Urine cultures negative. Symptoms have now completely resolved. #3 skin rash likely related to her Atezolizumab. Was grade 1 and is now much improved and nearly resolved with topical triamcinolone. #4 poor IV access- status post port #5  S/p Mucous and slight blood in stools. No diarrhea C. difficile negative. Much improved with mesalamine suppositories that she has been started on by GI . #6 signs of increased uptake in the pancreas with some mild lipase elevation normal amylase. No clinical evidence of pancreatitis but will need to be monitored closely. Repeat lipase levels improved.  Plan  -Labs stable and patient is appropriate to receive fifth cycle of Atezolizumab. Today. -Continue current pain management. -Continue Xgeva every 6 weeks -Continue triamcinolone ointment for her skin rash  -Continue daily mesalamine suppositories as per Dr. Hassell Done for ulcerative proctitis. If persistent symptoms might need to consider additional oral treatment for more proximal colon  involvement. -Will plan to treat the patient with 1200mg  Q3weeks until treatment progression or residual toxicities. -We'll take vitamin D levels due to mild hypocalcemia on next labs. -Patient has been counseled to seek immediate attention if she has increasing abdominal pain, GI  bleeding or diarrhea, nausea vomiting.  #6 Normocytic normochromic anemia likely due to anemia of chronic disease from her metastatic malignancy and chronic kidney disease and some gi bleeding that  has now resolved  -No indication of blood transfusion at this time  -We will monitor and treat with when necessary blood transfusions if symptomatic.  #7 history of hypertension patient's blood pressure is now lower given associated weight loss. Plan Off amlodipine and hydrochlorothiazide monitor blood pressure. It is well controlled today with no concerns.  All questions were answered. The patient knows to call the clinic with any problems, questions or concerns. I spent 25 minutes counseling the patient face to face. The total time spent in the appointment was 30 minutes and more than 50% was on counseling.   Sullivan Lone MD [place] [facility]  (Office):       [phone] (Work cell):  [phone] (Fax):           [phone]

## 2015-03-03 ENCOUNTER — Telehealth: Payer: Self-pay | Admitting: Hematology

## 2015-03-03 NOTE — Telephone Encounter (Signed)
CLd pt to adv moved appt to 9:45 on 12/16-pt understood

## 2015-03-17 ENCOUNTER — Other Ambulatory Visit: Payer: Self-pay | Admitting: *Deleted

## 2015-03-17 DIAGNOSIS — C659 Malignant neoplasm of unspecified renal pelvis: Secondary | ICD-10-CM

## 2015-03-18 ENCOUNTER — Ambulatory Visit (HOSPITAL_BASED_OUTPATIENT_CLINIC_OR_DEPARTMENT_OTHER): Payer: Medicare Other

## 2015-03-18 ENCOUNTER — Other Ambulatory Visit (HOSPITAL_BASED_OUTPATIENT_CLINIC_OR_DEPARTMENT_OTHER): Payer: Medicare Other

## 2015-03-18 ENCOUNTER — Ambulatory Visit: Payer: Medicare Other

## 2015-03-18 ENCOUNTER — Other Ambulatory Visit: Payer: Medicare Other

## 2015-03-18 VITALS — BP 139/80 | HR 71 | Temp 97.0°F | Resp 16

## 2015-03-18 DIAGNOSIS — Z5112 Encounter for antineoplastic immunotherapy: Secondary | ICD-10-CM

## 2015-03-18 DIAGNOSIS — C651 Malignant neoplasm of right renal pelvis: Secondary | ICD-10-CM | POA: Diagnosis not present

## 2015-03-18 DIAGNOSIS — C7951 Secondary malignant neoplasm of bone: Secondary | ICD-10-CM | POA: Diagnosis not present

## 2015-03-18 DIAGNOSIS — C659 Malignant neoplasm of unspecified renal pelvis: Secondary | ICD-10-CM

## 2015-03-18 DIAGNOSIS — Z95828 Presence of other vascular implants and grafts: Secondary | ICD-10-CM

## 2015-03-18 LAB — CBC WITH DIFFERENTIAL/PLATELET
BASO%: 0.7 % (ref 0.0–2.0)
BASOS ABS: 0 10*3/uL (ref 0.0–0.1)
EOS%: 0.7 % (ref 0.0–7.0)
Eosinophils Absolute: 0 10*3/uL (ref 0.0–0.5)
HCT: 32.5 % — ABNORMAL LOW (ref 34.8–46.6)
HGB: 10.3 g/dL — ABNORMAL LOW (ref 11.6–15.9)
LYMPH%: 37 % (ref 14.0–49.7)
MCH: 28.9 pg (ref 25.1–34.0)
MCHC: 31.6 g/dL (ref 31.5–36.0)
MCV: 91.4 fL (ref 79.5–101.0)
MONO#: 0.6 10*3/uL (ref 0.1–0.9)
MONO%: 13.4 % (ref 0.0–14.0)
NEUT#: 2 10*3/uL (ref 1.5–6.5)
NEUT%: 48.2 % (ref 38.4–76.8)
Platelets: 310 10*3/uL (ref 145–400)
RBC: 3.55 10*6/uL — ABNORMAL LOW (ref 3.70–5.45)
RDW: 16.8 % — AB (ref 11.2–14.5)
WBC: 4.2 10*3/uL (ref 3.9–10.3)
lymph#: 1.5 10*3/uL (ref 0.9–3.3)

## 2015-03-18 LAB — COMPREHENSIVE METABOLIC PANEL
ALT: 10 U/L (ref 0–55)
AST: 18 U/L (ref 5–34)
Albumin: 3.1 g/dL — ABNORMAL LOW (ref 3.5–5.0)
Alkaline Phosphatase: 45 U/L (ref 40–150)
Anion Gap: 8 mEq/L (ref 3–11)
BUN: 15.8 mg/dL (ref 7.0–26.0)
CHLORIDE: 109 meq/L (ref 98–109)
CO2: 24 meq/L (ref 22–29)
Calcium: 8.9 mg/dL (ref 8.4–10.4)
Creatinine: 1.2 mg/dL — ABNORMAL HIGH (ref 0.6–1.1)
EGFR: 42 mL/min/{1.73_m2} — ABNORMAL LOW (ref >90)
GLUCOSE: 109 mg/dL (ref 70–140)
POTASSIUM: 4 meq/L (ref 3.5–5.1)
SODIUM: 141 meq/L (ref 136–145)
Total Bilirubin: 0.35 mg/dL (ref 0.20–1.20)
Total Protein: 6.7 g/dL (ref 6.4–8.3)

## 2015-03-18 MED ORDER — HEPARIN SOD (PORK) LOCK FLUSH 100 UNIT/ML IV SOLN
500.0000 [IU] | Freq: Once | INTRAVENOUS | Status: AC | PRN
  Administered 2015-03-18: 500 [IU]
  Filled 2015-03-18: qty 5

## 2015-03-18 MED ORDER — DENOSUMAB 120 MG/1.7ML ~~LOC~~ SOLN
120.0000 mg | Freq: Once | SUBCUTANEOUS | Status: AC
  Administered 2015-03-18: 120 mg via SUBCUTANEOUS
  Filled 2015-03-18: qty 1.7

## 2015-03-18 MED ORDER — SODIUM CHLORIDE 0.9 % IV SOLN
Freq: Once | INTRAVENOUS | Status: AC
  Administered 2015-03-18: 11:00:00 via INTRAVENOUS

## 2015-03-18 MED ORDER — SODIUM CHLORIDE 0.9 % IJ SOLN
10.0000 mL | INTRAMUSCULAR | Status: DC | PRN
  Administered 2015-03-18: 10 mL
  Filled 2015-03-18: qty 10

## 2015-03-18 MED ORDER — SODIUM CHLORIDE 0.9 % IV SOLN
1200.0000 mg | Freq: Once | INTRAVENOUS | Status: AC
  Administered 2015-03-18: 1200 mg via INTRAVENOUS
  Filled 2015-03-18: qty 20

## 2015-03-18 MED ORDER — SODIUM CHLORIDE 0.9 % IJ SOLN
10.0000 mL | INTRAMUSCULAR | Status: DC | PRN
  Administered 2015-03-18: 10 mL via INTRAVENOUS
  Filled 2015-03-18: qty 10

## 2015-03-18 NOTE — Patient Instructions (Signed)

## 2015-03-18 NOTE — Patient Instructions (Signed)
[facility] Discharge Instructions for Patients Receiving Chemotherapy  Today you received the following chemotherapy agents Tecentriq To help prevent nausea and vomiting after your treatment, we encourage you to take your nausea medication as prescribed.   If you develop nausea and vomiting that is not controlled by your nausea medication, call the clinic.   BELOW ARE SYMPTOMS THAT SHOULD BE REPORTED IMMEDIATELY:  *FEVER GREATER THAN 100.5 F  *CHILLS WITH OR WITHOUT FEVER  NAUSEA AND VOMITING THAT IS NOT CONTROLLED WITH YOUR NAUSEA MEDICATION  *UNUSUAL SHORTNESS OF BREATH  *UNUSUAL BRUISING OR BLEEDING  TENDERNESS IN MOUTH AND THROAT WITH OR WITHOUT PRESENCE OF ULCERS  *URINARY PROBLEMS  *BOWEL PROBLEMS  UNUSUAL RASH Items with * indicate a potential emergency and should be followed up as soon as possible.  Feel free to call the clinic you have any questions or concerns. The clinic phone number is [phone].  Please show the CHEMO ALERT CARD at check-in to the Emergency Department and triage nurse.   

## 2015-03-19 LAB — VITAMIN D 25 HYDROXY (VIT D DEFICIENCY, FRACTURES): Vit D, 25-Hydroxy: 30 ng/mL (ref 30–100)

## 2015-04-05 ENCOUNTER — Other Ambulatory Visit: Payer: Self-pay | Admitting: *Deleted

## 2015-04-05 ENCOUNTER — Telehealth: Payer: Self-pay | Admitting: Hematology

## 2015-04-05 ENCOUNTER — Other Ambulatory Visit: Payer: Self-pay | Admitting: Hematology

## 2015-04-05 ENCOUNTER — Telehealth: Payer: Self-pay | Admitting: *Deleted

## 2015-04-05 NOTE — Telephone Encounter (Signed)
VM message from patient requesting next chemo treatment appt. She has none scheduled and she receives every 3 weeks

## 2015-04-05 NOTE — Telephone Encounter (Signed)
per pof tos ch pt appt-sent MW email to sch trmt-will call pt after reply °

## 2015-04-06 ENCOUNTER — Telehealth: Payer: Self-pay | Admitting: *Deleted

## 2015-04-06 NOTE — Telephone Encounter (Signed)
Per staff message and POF I have scheduled appts. Advised scheduler of appts. JMW  

## 2015-04-07 ENCOUNTER — Telehealth: Payer: Self-pay | Admitting: Hematology

## 2015-04-07 ENCOUNTER — Other Ambulatory Visit: Payer: Self-pay | Admitting: *Deleted

## 2015-04-07 DIAGNOSIS — C659 Malignant neoplasm of unspecified renal pelvis: Secondary | ICD-10-CM

## 2015-04-07 NOTE — Telephone Encounter (Signed)
per reply-cld pt and adv of appt time & date

## 2015-04-08 ENCOUNTER — Ambulatory Visit (HOSPITAL_BASED_OUTPATIENT_CLINIC_OR_DEPARTMENT_OTHER): Payer: Medicare Other

## 2015-04-08 ENCOUNTER — Encounter: Payer: Self-pay | Admitting: Hematology

## 2015-04-08 ENCOUNTER — Ambulatory Visit (HOSPITAL_BASED_OUTPATIENT_CLINIC_OR_DEPARTMENT_OTHER): Payer: Medicare Other | Admitting: Hematology

## 2015-04-08 ENCOUNTER — Other Ambulatory Visit (HOSPITAL_BASED_OUTPATIENT_CLINIC_OR_DEPARTMENT_OTHER): Payer: Medicare Other

## 2015-04-08 ENCOUNTER — Ambulatory Visit: Payer: Medicare Other

## 2015-04-08 VITALS — BP 141/61 | HR 66 | Temp 97.7°F | Resp 18

## 2015-04-08 DIAGNOSIS — C659 Malignant neoplasm of unspecified renal pelvis: Secondary | ICD-10-CM

## 2015-04-08 DIAGNOSIS — C787 Secondary malignant neoplasm of liver and intrahepatic bile duct: Secondary | ICD-10-CM | POA: Diagnosis not present

## 2015-04-08 DIAGNOSIS — C7800 Secondary malignant neoplasm of unspecified lung: Secondary | ICD-10-CM

## 2015-04-08 DIAGNOSIS — C7951 Secondary malignant neoplasm of bone: Secondary | ICD-10-CM

## 2015-04-08 DIAGNOSIS — Z5112 Encounter for antineoplastic immunotherapy: Secondary | ICD-10-CM | POA: Diagnosis not present

## 2015-04-08 DIAGNOSIS — D649 Anemia, unspecified: Secondary | ICD-10-CM

## 2015-04-08 DIAGNOSIS — C651 Malignant neoplasm of right renal pelvis: Secondary | ICD-10-CM

## 2015-04-08 DIAGNOSIS — G4700 Insomnia, unspecified: Secondary | ICD-10-CM

## 2015-04-08 DIAGNOSIS — Z95828 Presence of other vascular implants and grafts: Secondary | ICD-10-CM

## 2015-04-08 LAB — CBC WITH DIFFERENTIAL/PLATELET
BASO%: 0.5 % (ref 0.0–2.0)
BASOS ABS: 0 10*3/uL (ref 0.0–0.1)
EOS%: 0.7 % (ref 0.0–7.0)
Eosinophils Absolute: 0 10*3/uL (ref 0.0–0.5)
HEMATOCRIT: 33 % — AB (ref 34.8–46.6)
HGB: 10.4 g/dL — ABNORMAL LOW (ref 11.6–15.9)
LYMPH#: 2 10*3/uL (ref 0.9–3.3)
LYMPH%: 47.2 % (ref 14.0–49.7)
MCH: 29.4 pg (ref 25.1–34.0)
MCHC: 31.5 g/dL (ref 31.5–36.0)
MCV: 93.2 fL (ref 79.5–101.0)
MONO#: 0.8 10*3/uL (ref 0.1–0.9)
MONO%: 18.7 % — ABNORMAL HIGH (ref 0.0–14.0)
NEUT#: 1.4 10*3/uL — ABNORMAL LOW (ref 1.5–6.5)
NEUT%: 32.9 % — AB (ref 38.4–76.8)
PLATELETS: 272 10*3/uL (ref 145–400)
RBC: 3.54 10*6/uL — ABNORMAL LOW (ref 3.70–5.45)
RDW: 15.9 % — ABNORMAL HIGH (ref 11.2–14.5)
WBC: 4.2 10*3/uL (ref 3.9–10.3)

## 2015-04-08 LAB — COMPREHENSIVE METABOLIC PANEL
ALT: 9 U/L (ref 0–55)
ANION GAP: 7 meq/L (ref 3–11)
AST: 19 U/L (ref 5–34)
Albumin: 3.4 g/dL — ABNORMAL LOW (ref 3.5–5.0)
Alkaline Phosphatase: 43 U/L (ref 40–150)
BUN: 17.8 mg/dL (ref 7.0–26.0)
CALCIUM: 8.7 mg/dL (ref 8.4–10.4)
CHLORIDE: 110 meq/L — AB (ref 98–109)
CO2: 23 mEq/L (ref 22–29)
Creatinine: 0.9 mg/dL (ref 0.6–1.1)
EGFR: 55 mL/min/{1.73_m2} — ABNORMAL LOW (ref >90)
Glucose: 76 mg/dl (ref 70–140)
POTASSIUM: 4 meq/L (ref 3.5–5.1)
Sodium: 140 mEq/L (ref 136–145)
Total Bilirubin: 0.36 mg/dL (ref 0.20–1.20)
Total Protein: 6.8 g/dL (ref 6.4–8.3)

## 2015-04-08 MED ORDER — SODIUM CHLORIDE 0.9 % IJ SOLN
10.0000 mL | INTRAMUSCULAR | Status: DC | PRN
  Administered 2015-04-08: 10 mL
  Filled 2015-04-08: qty 10

## 2015-04-08 MED ORDER — MIRTAZAPINE 15 MG PO TABS: 15.0000 mg | ORAL_TABLET | Freq: Every day | ORAL | Status: DC

## 2015-04-08 MED ORDER — HEPARIN SOD (PORK) LOCK FLUSH 100 UNIT/ML IV SOLN
500.0000 [IU] | Freq: Once | INTRAVENOUS | Status: AC | PRN
  Administered 2015-04-08: 500 [IU]
  Filled 2015-04-08: qty 5

## 2015-04-08 MED ORDER — SODIUM CHLORIDE 0.9 % IV SOLN
1200.0000 mg | Freq: Once | INTRAVENOUS | Status: AC
  Administered 2015-04-08: 1200 mg via INTRAVENOUS
  Filled 2015-04-08: qty 20

## 2015-04-08 MED ORDER — SODIUM CHLORIDE 0.9 % IJ SOLN
10.0000 mL | INTRAMUSCULAR | Status: DC | PRN
  Administered 2015-04-08: 10 mL via INTRAVENOUS
  Filled 2015-04-08: qty 10

## 2015-04-08 MED ORDER — SODIUM CHLORIDE 0.9 % IV SOLN
Freq: Once | INTRAVENOUS | Status: AC
  Administered 2015-04-08: 12:00:00 via INTRAVENOUS

## 2015-04-08 NOTE — Progress Notes (Signed)
OK to treat with ANC-1.4 per Dr. Irene Limbo

## 2015-04-08 NOTE — Patient Instructions (Signed)

## 2015-04-08 NOTE — Patient Instructions (Addendum)
[name] Discharge Instructions for Patients Receiving Chemotherapy  Today you received the following chemotherapy agents Tecentriq To help prevent nausea and vomiting after your treatment, we encourage you to take your nausea medication as needed   If you develop nausea and vomiting that is not controlled by your nausea medication, call the clinic.   BELOW ARE SYMPTOMS THAT SHOULD BE REPORTED IMMEDIATELY:  *FEVER GREATER THAN 100.5 F  *CHILLS WITH OR WITHOUT FEVER  NAUSEA AND VOMITING THAT IS NOT CONTROLLED WITH YOUR NAUSEA MEDICATION  *UNUSUAL SHORTNESS OF BREATH  *UNUSUAL BRUISING OR BLEEDING  TENDERNESS IN MOUTH AND THROAT WITH OR WITHOUT PRESENCE OF ULCERS  *URINARY PROBLEMS  *BOWEL PROBLEMS  UNUSUAL RASH Items with * indicate a potential emergency and should be followed up as soon as possible.  Feel free to call the clinic you have any questions or concerns. The clinic phone number is [phone].  Please show the [place] at check-in to the Emergency Department and triage nurse.

## 2015-04-11 ENCOUNTER — Telehealth: Payer: Self-pay | Admitting: Hematology

## 2015-04-11 NOTE — Telephone Encounter (Signed)
Called patient and she is aware of her appointments °

## 2015-04-12 NOTE — Progress Notes (Signed)
Hematology oncology clinic follow-up.  Date of service  .04/08/2015  Patient Care Team: [name], MD as PCP - General (Internal Medicine)   Urologist:Dr. 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  Diagnosis:  Widely metastatic transitional cell carcinoma from the right renal pelvis.  Treatment -Atezolizumab IV q3weeks status post 6 cycles  HISTORY OF PRESENTING ILLNESS: Please see my previous note for details of initial presentation.  INTERVAL HISTORY  Tiffany [name] is here for for her for her scheduled follow-up prior to her seventh cycle of Atezolizumab. She notes that she has been feeling well with good energy levels. Good appetite. No diarrhea. No nausea or vomiting. No further rectal bleeding. No abdominal pain. No mouth sores. Minimal skin rash that has mostly resolved with topical steroids. No fevers or chills. Notes that her insurance coverage questions were answered by the financial counselor or on her last visit.  MEDICAL HISTORY:  Past Medical History  Diagnosis Date  . Hypertension   . Edema leg     feet and ankles  . Headache(784.0)   . Esophagus disorder     Had esophagus stretched  . Arthritis      knees 03-10-12 had Cortisone injection  . Occipital neuralgia   . Ulcerative proctitis ([place])   . DDD (degenerative disc disease), cervical   . GERD (gastroesophageal reflux disease)     Benign stricture dilated in 2011  . Occipital neuralgia     Related to cervical degenerative disc disease  . Gout     Patient notes she has possible gout but has not been on chronic medications for this  . Peptic ulcer disease     Previous history in the remote past  . Ulcerative proctitis ([place]) 2014  . Cancer [facility]) june 2016    metastatic    SURGICAL HISTORY: Past Surgical History  Procedure Laterality Date  . Cholecystectomy    . Appendectomy    .  Abdominal hysterectomy    . Dilation and curettage of uterus    . Neck surgery      20 years ago  . Esophagogastroduodenoscopy    . Colonoscopy with propofol  03/25/2012    Procedure: COLONOSCOPY WITH PROPOFOL;  Surgeon: Garlan Fair, MD;  Location: [facility];  Service: Endoscopy;  Laterality: N/A;  . Flexible sigmoidoscopy N/A 04/26/2014    Procedure: FLEXIBLE SIGMOIDOSCOPY - UnSedated;  Surgeon: Garlan Fair, MD;  Location: [facility];  Service: Endoscopy;  Laterality: N/A;  . Esophageal dilation      For benign stricture in 2011  . Total abdominal hysterectomy w/ bilateral salpingoophorectomy      At age 51 due to miscarriage and retained products of conception causing significant uterine bleeding. Patient has been on estrogen replacement therapy since then.  . Flexible sigmoidoscopy N/A 01/24/2015    Procedure: FLEXIBLE SIGNMOIDOSCOPY W/ FLEET ENEMIA;  Surgeon: Garlan Fair, MD;  Location: [facility];  Service: Endoscopy;  Laterality: N/A;    SOCIAL HISTORY: Social History   Social History  . Marital Status: Married    Spouse Name: N/A  . Number of Children: N/A  . Years of Education: N/A   Occupational History  . Not on file.   Social History Main Topics  . Smoking status: Former Smoker    Quit date: 11/01/1983  . Smokeless tobacco: Never Used  . Alcohol Use: Yes  . Drug Use: No  . Sexual Activity:  Not on file   Other Topics Concern  . Not on file   Social History Narrative  Retired as Conservation officer, nature for [employer]. She is very well spoken and intelligent individual and exhibits a keen knowledge of her medical conditions.  FAMILY HISTORY: Family History  Problem Relation Age of Onset  . Lung cancer Sister   . Prostate cancer Brother   . Uterine cancer Maternal Aunt   . Breast cancer Paternal Grandmother   . Hodgkin's lymphoma Sister     ALLERGIES:  is allergic to nitrofurantoin; indomethacin; and lisinopril.  MEDICATIONS:   Current Outpatient Prescriptions  Medication Sig Dispense Refill  . acetaminophen (TYLENOL) 650 MG CR tablet Take 1,300 mg by mouth 2 (two) times daily.    Marland Kitchen amLODipine (NORVASC) 5 MG tablet Take 5 mg by mouth daily.    Marland Kitchen CANASA 1000 MG suppository   11  . estradiol (ESTRACE) 0.5 MG tablet Take 0.5 mg by mouth daily.  1  . mirtazapine (REMERON) 15 MG tablet Take 1 tablet (15 mg total) by mouth at bedtime. 30 tablet 2  . OVER THE COUNTER MEDICATION Apply 1 application topically. [place] dream over the counter cream for arthritis    . oxyCODONE (OXY IR/ROXICODONE) 5 MG immediate release tablet Take 0.5-1 tablets (2.5-5 mg total) by mouth every 4 (four) hours as needed for moderate pain, severe pain or breakthrough pain. 60 tablet 0  . senna-docusate (SENNA S) 8.6-50 MG per tablet Take 2 tablets by mouth at bedtime. 60 tablet 1  . triamcinolone ointment (KENALOG) 0.5 % Apply 1 application topically 2 (two) times daily. 30 g 0  . ondansetron (ZOFRAN) 4 MG tablet Take 1 tablet (4 mg total) by mouth every 8 (eight) hours as needed for nausea or vomiting. (Patient not taking: Reported on 04/08/2015) 30 tablet 0   No current facility-administered medications for this visit.   REVIEW OF SYSTEMS:    10 point review of systems done and is negative except as noted above  PHYSICAL EXAMINATION: ECOG PERFORMANCE STATUS: 1  Filed Vitals:   04/08/15 1108  BP: 141/61  Pulse: 66  Temp: 97.7 F (36.5 C)  Resp: 18   There were no vitals filed for this visit. GENERAL elderly Caucasian female, alert, no distress and comfortable SKIN: Macular reddish colored spotty rash over upper extremities and one spot on the face. Mildly pruritic nonpainful. EYES: normal, conjunctiva are pink and non-injected, sclera clear OROPHARYNX:no exudate, no erythema and lips, buccal mucosa, and tongue normal  NECK: supple, thyroid normal size, non-tender, without nodularity LYMPH:  no palpable lymphadenopathy in the  cervical, axillary or inguinal LUNGS: clear to auscultation and percussion with normal breathing effort HEART: regular rate & rhythm and no murmurs and no lower extremity edema ABDOMEN:abdomen soft, non-tender and normal bowel sounds Musculoskeletal:no cyanosis of digits and no clubbing  PSYCH: alert & oriented x 3 with fluent speech NEURO: no focal motor/sensory deficits  LABORATORY DATA:  . CBC Latest Ref Rng 04/08/2015 03/18/2015 02/25/2015  WBC 3.9 - 10.3 10e3/uL 4.2 4.2 4.3  Hemoglobin 11.6 - 15.9 g/dL 10.4(L) 10.3(L) 9.2(L)  Hematocrit 34.8 - 46.6 % 33.0(L) 32.5(L) 28.6(L)  Platelets 145 - 400 10e3/uL 272 310 232      Recent Labs  10/16/14 1611 10/17/14 0353  02/25/15 0916 03/18/15 0943 04/08/15 0932  NA 135 135  < > 140 141 140  K 4.5 3.5  < > 3.7 4.0 4.0  CL 101 101  --   --   --   --  CO2 24 24  < > 22 24 23   GLUCOSE 174* 130*  < > 83 109 76  BUN 21* 20  < > 12.0 15.8 17.8  CREATININE 1.52* 1.25*  < > 0.7 1.2* 0.9  CALCIUM 9.8 8.9  < > 7.9* 8.9 8.7  GFRNONAA 29* 37*  --   --   --   --   GFRAA 34* 43*  --   --   --   --   PROT  --   --   < > 5.4* 6.7 6.8  ALBUMIN  --   --   < > 2.5* 3.1* 3.4*  AST  --   --   < > 15 18 19   ALT  --   --   < > 9 10 9   ALKPHOS  --   --   < > 35* 45 43  BILITOT  --   --   < > 0.33 0.35 0.36  < > = values in this interval not displayed.    RADIOGRAPHIC STUDIES:  PET/CT 11/24/2014:  IMPRESSION: 1. Multi focal hypermetabolism within nodal stations, pulmonary nodules, bones and liver. Most consistent with metastatic disease versus less likely lymphoma. 2. Hypermetabolism corresponding to soft tissue fullness in the right renal pelvis. This is suspicious for transitional cell carcinoma (possibly the primary). Infiltrative renal cell carcinoma or lymphoma felt less likely.   PET/CT 01/28/2015: IMPRESSION: 1. Interval significant response to therapy. The widespread hypermetabolic metastatic disease involving the liver, lungs  and bones has nearly completely resolved, without residual abnormal metabolic activity. There are persistent morphologic changes within the liver and bones, probably related to treated disease. 2. New diffuse pancreatic activity. This is probably a manifestation of pancreatitis, possibly secondary to Atezolizumab adverse reaction. Laboratory correlation recommended. 3. Decreased right renal function, likely related to collecting system obstruction from the known transitional cell carcinoma.   ASSESSMENT & PLAN:   82 year old Caucasian female with  #1 Right Renal pelvis metastatic transitional cell carcinoma.  PET scan reveals metastases to the lungs, liver, multiple nodal stations and bones. She is status post 6 cycles of Atezolizumab. PET scan done after 3 cycles of treatment for restaging disease showed significant response to treatment #2 Urethral pains and bladder spasms/ discomfort. Urine cultures negative. Symptoms have now completely resolved. #3 skin rash likely related to her Atezolizumab. Was grade 1 and is now much improved and nearly resolved with topical triamcinolone. #4  S/p Mucous and slight blood in stools. No diarrhea C. difficile negative. Symptoms resolved with mesalamine suppositories that she had been started on by GI .currently off these with stable symptoms. Plan  -Labs stable and patient is appropriate to receive seventh cycle of Atezolizumab. Today. -Continue current pain management.needing minimal pain medications at this time. -Continue Xgeva every 6 weeks -Continue triamcinolone ointment for her skin rash as needed. -Continue mesalamine suppositories as per Dr. Hassell Done for ulcerative proctitis.  -Will plan to treat the patient with 1200mg  Q3weeks until treatment progression or residual toxicities. -Patient has been counseled to seek immediate attention if she has increasing abdominal pain, GI bleeding or diarrhea, nausea vomiting -repeat PET CT scan prior to  cycle 8 of treatment to assess treatment response.  #6 Normocytic normochromic anemia likely due to anemia of chronic disease from her metastatic malignancy and chronic kidney disease and some gi bleeding that  has now resolved  -No indication of blood transfusion at this time  -We will monitor and treat with when necessary blood transfusions if  symptomatic.  #7 history of hypertension patient's blood pressure is now lower given associated weight loss. Plan Off amlodipine and hydrochlorothiazide monitor blood pressure. It is well controlled today with no concerns.  All questions were answered. The patient knows to call the clinic with any problems, questions or concerns. I spent 20 minutes counseling the patient face to face. The total time spent in the appointment was 20 minutes and more than 50% was on counseling.   Sullivan Lone MD [place] Hematology/Oncology Physician [facility]  (Office):       [phone] (Work cell):  [phone] (Fax):           [phone]

## 2015-04-14 ENCOUNTER — Other Ambulatory Visit: Payer: Self-pay | Admitting: *Deleted

## 2015-04-15 ENCOUNTER — Ambulatory Visit: Payer: Medicare Other

## 2015-04-19 ENCOUNTER — Other Ambulatory Visit: Payer: Self-pay | Admitting: *Deleted

## 2015-04-19 MED ORDER — AZITHROMYCIN 250 MG PO TABS: ORAL_TABLET | ORAL | Status: DC

## 2015-04-27 ENCOUNTER — Ambulatory Visit (HOSPITAL_COMMUNITY)
Admission: RE | Admit: 2015-04-27 | Discharge: 2015-04-27 | Disposition: A | Discharge status: Home / Self Care | Payer: Medicare Other | Source: Ambulatory Visit | Attending: Hematology | Admitting: Hematology

## 2015-04-27 DIAGNOSIS — R918 Other nonspecific abnormal finding of lung field: Secondary | ICD-10-CM | POA: Insufficient documentation

## 2015-04-27 DIAGNOSIS — N1330 Unspecified hydronephrosis: Secondary | ICD-10-CM | POA: Diagnosis not present

## 2015-04-27 DIAGNOSIS — C659 Malignant neoplasm of unspecified renal pelvis: Secondary | ICD-10-CM | POA: Insufficient documentation

## 2015-04-27 DIAGNOSIS — I2510 Atherosclerotic heart disease of native coronary artery without angina pectoris: Secondary | ICD-10-CM | POA: Diagnosis not present

## 2015-04-27 LAB — GLUCOSE, CAPILLARY: GLUCOSE-CAPILLARY: 51 mg/dL — AB (ref 65–99)

## 2015-04-27 MED ORDER — FLUDEOXYGLUCOSE F - 18 (FDG) INJECTION
6.4000 | Freq: Once | INTRAVENOUS | Status: AC | PRN
  Administered 2015-04-27: 6.4 via INTRAVENOUS

## 2015-04-29 ENCOUNTER — Encounter: Payer: Self-pay | Admitting: Hematology

## 2015-04-29 ENCOUNTER — Telehealth: Payer: Self-pay | Admitting: Hematology

## 2015-04-29 ENCOUNTER — Ambulatory Visit (HOSPITAL_BASED_OUTPATIENT_CLINIC_OR_DEPARTMENT_OTHER): Payer: Medicare Other | Admitting: Hematology

## 2015-04-29 ENCOUNTER — Other Ambulatory Visit (HOSPITAL_BASED_OUTPATIENT_CLINIC_OR_DEPARTMENT_OTHER): Payer: Medicare Other

## 2015-04-29 ENCOUNTER — Other Ambulatory Visit: Payer: Self-pay | Admitting: *Deleted

## 2015-04-29 ENCOUNTER — Ambulatory Visit (HOSPITAL_BASED_OUTPATIENT_CLINIC_OR_DEPARTMENT_OTHER): Payer: Medicare Other

## 2015-04-29 VITALS — BP 165/78 | HR 74 | Temp 98.1°F | Resp 18 | Ht 61.0 in | Wt 126.5 lb

## 2015-04-29 DIAGNOSIS — C7951 Secondary malignant neoplasm of bone: Secondary | ICD-10-CM

## 2015-04-29 DIAGNOSIS — C649 Malignant neoplasm of unspecified kidney, except renal pelvis: Secondary | ICD-10-CM

## 2015-04-29 DIAGNOSIS — Z5112 Encounter for antineoplastic immunotherapy: Secondary | ICD-10-CM | POA: Diagnosis not present

## 2015-04-29 DIAGNOSIS — C651 Malignant neoplasm of right renal pelvis: Secondary | ICD-10-CM

## 2015-04-29 DIAGNOSIS — C659 Malignant neoplasm of unspecified renal pelvis: Secondary | ICD-10-CM

## 2015-04-29 DIAGNOSIS — Z79899 Other long term (current) drug therapy: Secondary | ICD-10-CM

## 2015-04-29 DIAGNOSIS — R062 Wheezing: Secondary | ICD-10-CM

## 2015-04-29 DIAGNOSIS — D649 Anemia, unspecified: Secondary | ICD-10-CM

## 2015-04-29 DIAGNOSIS — C787 Secondary malignant neoplasm of liver and intrahepatic bile duct: Secondary | ICD-10-CM

## 2015-04-29 DIAGNOSIS — C778 Secondary and unspecified malignant neoplasm of lymph nodes of multiple regions: Secondary | ICD-10-CM | POA: Diagnosis not present

## 2015-04-29 DIAGNOSIS — J180 Bronchopneumonia, unspecified organism: Secondary | ICD-10-CM

## 2015-04-29 LAB — CBC & DIFF AND RETIC
BASO%: 0.4 % (ref 0.0–2.0)
Basophils Absolute: 0 10*3/uL (ref 0.0–0.1)
EOS%: 1.4 % (ref 0.0–7.0)
Eosinophils Absolute: 0.1 10*3/uL (ref 0.0–0.5)
HEMATOCRIT: 37.2 % (ref 34.8–46.6)
HGB: 11.8 g/dL (ref 11.6–15.9)
Immature Retic Fract: 8.6 % (ref 1.60–10.00)
LYMPH#: 2.2 10*3/uL (ref 0.9–3.3)
LYMPH%: 43.5 % (ref 14.0–49.7)
MCH: 29.6 pg (ref 25.1–34.0)
MCHC: 31.7 g/dL (ref 31.5–36.0)
MCV: 93.2 fL (ref 79.5–101.0)
MONO#: 0.6 10*3/uL (ref 0.1–0.9)
MONO%: 12.5 % (ref 0.0–14.0)
NEUT#: 2.1 10*3/uL (ref 1.5–6.5)
NEUT%: 42.2 % (ref 38.4–76.8)
Platelets: 328 10*3/uL (ref 145–400)
RBC: 3.99 10*6/uL (ref 3.70–5.45)
RDW: 15.6 % — AB (ref 11.2–14.5)
RETIC %: 1.34 % (ref 0.70–2.10)
RETIC CT ABS: 53.47 10*3/uL (ref 33.70–90.70)
WBC: 5 10*3/uL (ref 3.9–10.3)

## 2015-04-29 LAB — COMPREHENSIVE METABOLIC PANEL
ALT: 9 U/L (ref 0–55)
ANION GAP: 8 meq/L (ref 3–11)
AST: 19 U/L (ref 5–34)
Albumin: 3.7 g/dL (ref 3.5–5.0)
Alkaline Phosphatase: 51 U/L (ref 40–150)
BUN: 18 mg/dL (ref 7.0–26.0)
CALCIUM: 9.4 mg/dL (ref 8.4–10.4)
CHLORIDE: 107 meq/L (ref 98–109)
CO2: 25 meq/L (ref 22–29)
CREATININE: 1.2 mg/dL — AB (ref 0.6–1.1)
EGFR: 40 mL/min/{1.73_m2} — ABNORMAL LOW (ref >90)
Glucose: 88 mg/dl (ref 70–140)
POTASSIUM: 4.5 meq/L (ref 3.5–5.1)
Sodium: 141 mEq/L (ref 136–145)
Total Bilirubin: 0.4 mg/dL (ref 0.20–1.20)
Total Protein: 7.8 g/dL (ref 6.4–8.3)

## 2015-04-29 LAB — TSH: TSH: 0.87 m[IU]/L (ref 0.308–3.960)

## 2015-04-29 MED ORDER — HEPARIN SOD (PORK) LOCK FLUSH 100 UNIT/ML IV SOLN
500.0000 [IU] | Freq: Once | INTRAVENOUS | Status: AC | PRN
  Administered 2015-04-29: 500 [IU]
  Filled 2015-04-29: qty 5

## 2015-04-29 MED ORDER — CEFPODOXIME PROXETIL 200 MG PO TABS: 200.0000 mg | ORAL_TABLET | Freq: Two times a day (BID) | ORAL | Status: AC

## 2015-04-29 MED ORDER — SODIUM CHLORIDE 0.9 % IJ SOLN
10.0000 mL | INTRAMUSCULAR | Status: DC | PRN
  Administered 2015-04-29: 10 mL
  Filled 2015-04-29: qty 10

## 2015-04-29 MED ORDER — SODIUM CHLORIDE 0.9 % IV SOLN
1200.0000 mg | Freq: Once | INTRAVENOUS | Status: AC
  Administered 2015-04-29: 1200 mg via INTRAVENOUS
  Filled 2015-04-29: qty 20

## 2015-04-29 MED ORDER — SODIUM CHLORIDE 0.9 % IV SOLN
Freq: Once | INTRAVENOUS | Status: AC
  Administered 2015-04-29: 13:00:00 via INTRAVENOUS

## 2015-04-29 MED ORDER — ALBUTEROL SULFATE (2.5 MG/3ML) 0.083% IN NEBU
2.5000 mg | INHALATION_SOLUTION | Freq: Once | RESPIRATORY_TRACT | Status: DC
  Filled 2015-04-29: qty 3

## 2015-04-29 MED ORDER — DENOSUMAB 120 MG/1.7ML ~~LOC~~ SOLN
120.0000 mg | Freq: Once | SUBCUTANEOUS | Status: AC
  Administered 2015-04-29: 120 mg via SUBCUTANEOUS
  Filled 2015-04-29: qty 1.7

## 2015-04-29 NOTE — Telephone Encounter (Signed)
Appointments made per 1/27 pof

## 2015-04-29 NOTE — Telephone Encounter (Signed)
per pof to sch pt appt-sent MW email to sch trmt-pt to get updated copy b4 leaving trmt room °

## 2015-04-29 NOTE — Patient Instructions (Addendum)
[facility] Discharge Instructions for Patients Receiving Chemotherapy  Today you received the following chemotherapy agents Tecentriq  To help prevent nausea and vomiting after your treatment, we encourage you to take your nausea medication   If you develop nausea and vomiting that is not controlled by your nausea medication, call the clinic.   BELOW ARE SYMPTOMS THAT SHOULD BE REPORTED IMMEDIATELY:  *FEVER GREATER THAN 100.5 F  *CHILLS WITH OR WITHOUT FEVER  NAUSEA AND VOMITING THAT IS NOT CONTROLLED WITH YOUR NAUSEA MEDICATION  *UNUSUAL SHORTNESS OF BREATH  *UNUSUAL BRUISING OR BLEEDING  TENDERNESS IN MOUTH AND THROAT WITH OR WITHOUT PRESENCE OF ULCERS  *URINARY PROBLEMS  *BOWEL PROBLEMS  UNUSUAL RASH Items with * indicate a potential emergency and should be followed up as soon as possible.  Feel free to call the clinic you have any questions or concerns. The clinic phone number is [phone].  Please show the CHEMO ALERT CARD at check-in to the Emergency Department and triage nurse.   

## 2015-05-01 ENCOUNTER — Emergency Department (HOSPITAL_COMMUNITY): Payer: Medicare Other

## 2015-05-01 ENCOUNTER — Emergency Department (HOSPITAL_COMMUNITY)
Admission: EM | Admit: 2015-05-01 | Discharge: 2015-05-01 | Disposition: A | Discharge status: Home / Self Care | Payer: Medicare Other | Attending: Emergency Medicine | Admitting: Emergency Medicine

## 2015-05-01 ENCOUNTER — Encounter (HOSPITAL_COMMUNITY): Payer: Self-pay | Admitting: Emergency Medicine

## 2015-05-01 DIAGNOSIS — Z8739 Personal history of other diseases of the musculoskeletal system and connective tissue: Secondary | ICD-10-CM | POA: Diagnosis not present

## 2015-05-01 DIAGNOSIS — S39012A Strain of muscle, fascia and tendon of lower back, initial encounter: Secondary | ICD-10-CM

## 2015-05-01 DIAGNOSIS — Z8711 Personal history of peptic ulcer disease: Secondary | ICD-10-CM | POA: Diagnosis not present

## 2015-05-01 DIAGNOSIS — Y9389 Activity, other specified: Secondary | ICD-10-CM | POA: Insufficient documentation

## 2015-05-01 DIAGNOSIS — W01198A Fall on same level from slipping, tripping and stumbling with subsequent striking against other object, initial encounter: Secondary | ICD-10-CM | POA: Insufficient documentation

## 2015-05-01 DIAGNOSIS — K5120 Ulcerative (chronic) proctitis without complications: Secondary | ICD-10-CM | POA: Insufficient documentation

## 2015-05-01 DIAGNOSIS — Z87891 Personal history of nicotine dependence: Secondary | ICD-10-CM | POA: Insufficient documentation

## 2015-05-01 DIAGNOSIS — S79912A Unspecified injury of left hip, initial encounter: Secondary | ICD-10-CM | POA: Diagnosis present

## 2015-05-01 DIAGNOSIS — Y9289 Other specified places as the place of occurrence of the external cause: Secondary | ICD-10-CM | POA: Diagnosis not present

## 2015-05-01 DIAGNOSIS — I10 Essential (primary) hypertension: Secondary | ICD-10-CM | POA: Insufficient documentation

## 2015-05-01 DIAGNOSIS — S29001A Unspecified injury of muscle and tendon of front wall of thorax, initial encounter: Secondary | ICD-10-CM | POA: Diagnosis not present

## 2015-05-01 DIAGNOSIS — Y998 Other external cause status: Secondary | ICD-10-CM | POA: Diagnosis not present

## 2015-05-01 DIAGNOSIS — Z79899 Other long term (current) drug therapy: Secondary | ICD-10-CM | POA: Diagnosis not present

## 2015-05-01 DIAGNOSIS — C801 Malignant (primary) neoplasm, unspecified: Secondary | ICD-10-CM | POA: Diagnosis not present

## 2015-05-01 LAB — URINALYSIS, ROUTINE W REFLEX MICROSCOPIC
BILIRUBIN URINE: NEGATIVE
Glucose, UA: NEGATIVE mg/dL
HGB URINE DIPSTICK: NEGATIVE
KETONES UR: NEGATIVE mg/dL
Leukocytes, UA: NEGATIVE
Nitrite: NEGATIVE
PROTEIN: NEGATIVE mg/dL
Specific Gravity, Urine: 1.008 (ref 1.005–1.030)
pH: 6 (ref 5.0–8.0)

## 2015-05-01 MED ORDER — HYDROCODONE-ACETAMINOPHEN 5-325 MG PO TABS: 1.0000 | ORAL_TABLET | ORAL | Status: DC | PRN

## 2015-05-01 NOTE — ED Notes (Addendum)
Pt has not evidence of trauma such as bruising or deformities noted. Dr Vallery Ridge at bedside

## 2015-05-01 NOTE — ED Notes (Signed)
Bed: WA24 Expected date:  Expected time:  Means of arrival:  Comments: 

## 2015-05-01 NOTE — ED Notes (Addendum)
Pt reports that she fell 2 days ago when she was being treated at the cancer center and has had L hip pain and difficulty walking. Pt sts that she hit her head on the back of the wall but had no LOC. Pt sts that it is an intermittent pain. Pt is able to take minimal steps and ambulate with assistance. Pt is A&O and in NAD.

## 2015-05-01 NOTE — ED Notes (Signed)
Patient transported to X-ray 

## 2015-05-01 NOTE — ED Notes (Signed)
RT to come and admin neb

## 2015-05-01 NOTE — ED Provider Notes (Signed)
CSN: [id number]     Arrival date & time 05/01/15  0756 History   First MD Initiated Contact with Patient 05/01/15 [phone]     Chief Complaint  Patient presents with  . Hip Pain     (Consider location/radiation/quality/duration/timing/severity/associated sxs/prior Treatment) HPI The patient reports she fell in the bathroom stall at the outpatient [facility] 2 days ago. She had used the restroom and then when she was standing she lost her balance. She reports due to the confined space, she hit her lower back and hip on the commode and her head on the wall. She had no loss of consciousness. She has not been having any headache. No confusion. She reports initially after it happened she is out that she was okay and returned home. She reports she was sore in the left hip and lower back but it was not too severe. This morning she reports that was standing and walking she has much more severe pain in her left lower back. She indicates the upper area of her posterior iliac wing on the left. This is not painful with sitting. This is worse with standing and ambulation. She denies any pain in the hip joint or over the trochanter. No shortness of breath or difficulty breathing. No abdominal pain. Past Medical History  Diagnosis Date  . Hypertension   . Edema leg     feet and ankles  . Headache(784.0)   . Esophagus disorder     Had esophagus stretched  . Arthritis      knees 03-10-12 had Cortisone injection  . Occipital neuralgia   . Ulcerative proctitis ([place])   . DDD (degenerative disc disease), cervical   . GERD (gastroesophageal reflux disease)     Benign stricture dilated in 2011  . Occipital neuralgia     Related to cervical degenerative disc disease  . Gout     Patient notes she has possible gout but has not been on chronic medications for this  . Peptic ulcer disease     Previous history in the remote past  . Ulcerative proctitis ([place]) 2014  . Cancer [facility]) june 2016    metastatic   Past  Surgical History  Procedure Laterality Date  . Cholecystectomy    . Appendectomy    . Abdominal hysterectomy    . Dilation and curettage of uterus    . Neck surgery      20 years ago  . Esophagogastroduodenoscopy    . Colonoscopy with propofol  03/25/2012    Procedure: COLONOSCOPY WITH PROPOFOL;  Surgeon: Garlan Fair, MD;  [facility];  Service: Endoscopy;  Laterality: N/A;  . Flexible sigmoidoscopy N/A 04/26/2014    Procedure: FLEXIBLE SIGMOIDOSCOPY - UnSedated;  Surgeon: Garlan Fair, MD;  [facility];  Service: Endoscopy;  Laterality: N/A;  . Esophageal dilation      For benign stricture in 2011  . Total abdominal hysterectomy w/ bilateral salpingoophorectomy      At age 46 due to miscarriage and retained products of conception causing significant uterine bleeding. Patient has been on estrogen replacement therapy since then.  . Flexible sigmoidoscopy N/A 01/24/2015    Procedure: FLEXIBLE SIGNMOIDOSCOPY W/ FLEET ENEMIA;  Surgeon: Garlan Fair, MD;  [facility];  Service: Endoscopy;  Laterality: N/A;   Family History  Problem Relation Age of Onset  . Lung cancer Sister   . Prostate cancer Brother   . Uterine cancer Maternal Aunt   . Breast cancer Paternal Grandmother   .  Hodgkin's lymphoma Sister    Social History  Substance Use Topics  . Smoking status: Former Smoker    Quit date: 11/01/1983  . Smokeless tobacco: Never Used  . Alcohol Use: Yes   OB History    No data available     Review of Systems  10 Systems reviewed and are negative for acute change except as noted in the HPI.   Allergies  Nitrofurantoin; Indomethacin; and Lisinopril  Home Medications   Prior to Admission medications   Medication Sig Start Date End Date Taking? Authorizing Provider  acetaminophen (TYLENOL) 650 MG CR tablet Take 1,300 mg by mouth 2 (two) times daily.   Yes Historical Provider, MD  amLODipine (NORVASC) 5 MG tablet Take 5 mg by mouth  daily.   Yes Historical Provider, MD  CANASA 1000 MG suppository Place 1,000 mg rectally daily as needed.  02/01/15  Yes Historical Provider, MD  cefpodoxime (VANTIN) 200 MG tablet Take 1 tablet (200 mg total) by mouth 2 (two) times daily. 04/29/15 05/13/15 Yes Gautam Juleen China, MD  estradiol (ESTRACE) 0.5 MG tablet Take 0.5 mg by mouth daily. 08/02/14  Yes Historical Provider, MD  mirtazapine (REMERON) 15 MG tablet Take 1 tablet (15 mg total) by mouth at bedtime. 04/08/15  Yes Gautam Juleen China, MD  ondansetron (ZOFRAN) 4 MG tablet Take 1 tablet (4 mg total) by mouth every 8 (eight) hours as needed for nausea or vomiting. 11/30/14  Yes Gautam Juleen China, MD  OVER THE COUNTER MEDICATION Apply 1 application topically. [place] dream over the counter cream for arthritis   Yes Historical Provider, MD  oxyCODONE (OXY IR/ROXICODONE) 5 MG immediate release tablet Take 0.5-1 tablets (2.5-5 mg total) by mouth every 4 (four) hours as needed for moderate pain, severe pain or breakthrough pain. 02/21/15  Yes [place], MD  PRESCRIPTION MEDICATION Chemo card   Yes Historical Provider, MD  senna-docusate (SENNA S) 8.6-50 MG per tablet Take 2 tablets by mouth at bedtime. 11/30/14  Yes [name], MD  triamcinolone ointment (KENALOG) 0.5 % Apply 1 application topically 2 (two) times daily. Patient taking differently: Apply 1 application topically 2 (two) times daily as needed (for rash).  12/24/14  Yes [name], MD  azithromycin (ZITHROMAX Z-PAK) 250 MG tablet Take 2 tablets (500mg ) by mouth x 1 day.  Then take 1 tablet (250mg ) by mouth x 4 days. Patient not taking: Reported on 05/01/2015 04/19/15   [name], MD  HYDROcodone-acetaminophen (NORCO/VICODIN) 5-325 MG tablet Take 1-2 tablets by mouth every 4 (four) hours as needed for moderate pain or severe pain. 05/01/15   Charlesetta Shanks, MD   BP 137/61 mmHg  Pulse 76  Temp(Src) 98.3 F (36.8 C) (Oral)  Resp 16  SpO2  93% Physical Exam  Constitutional: She is oriented to person, place, and time. She appears well-developed and well-nourished.  Patient sitting on the edge of stretcher, nontoxic and alert. Well in appearance.  HENT:  Head: Normocephalic and atraumatic.  Eyes: EOM are normal. Pupils are equal, round, and reactive to light.  Neck: Neck supple.  Cardiovascular: Normal rate, regular rhythm, normal heart sounds and intact distal pulses.   Pulmonary/Chest: Effort normal and breath sounds normal. She exhibits tenderness.  Tenderness is reproducible at the lower rib margins on the posterior thorax at about ribs 11 and 12. Tenderness continues over the CVA region on the left. There is no object of bruising or soft tissue swelling.  Abdominal: Soft. Bowel sounds are normal.  She exhibits no distension. There is no tenderness.  Musculoskeletal: Normal range of motion. She exhibits no edema.  Patient does not have tenderness over the trochanter and can weight bear without difficulty. Reducible pain to palpation is at the lower rib margins on the posterior thorax in the paraspinous region in the CVA on the left. Pelvis is stable and no significant reproducible pain over the SI joint or the iliac crest. Of her own volition, the patient is able to transition from sitting to standing and ambulate.  Neurological: She is alert and oriented to person, place, and time. She has normal strength. She exhibits normal muscle tone. Coordination normal. GCS eye subscore is 4. GCS verbal subscore is 5. GCS motor subscore is 6.  Skin: Skin is warm, dry and intact.  Psychiatric: She has a normal mood and affect.    ED Course  Procedures (including critical care time) Labs Review Labs Reviewed  URINALYSIS, ROUTINE W REFLEX MICROSCOPIC (NOT AT [facility]) - Abnormal; Notable for the following:    APPearance CLOUDY (*)    All other components within normal limits    Imaging Review Dg Ribs Unilateral W/chest Left  05/01/2015   CLINICAL DATA:  Fall 3 days ago with persistent left lower posterior rib pain. Initial encounter. EXAM: LEFT RIBS AND CHEST - 3+ VIEW COMPARISON:  Chest x-ray from 10/16/2014. FINDINGS: The lungs are clear wiithout focal pneumonia, edema, pneumothorax or pleural effusion. The cardiopericardial silhouette is within normal limits for size. Right-sided Port-A-Cath tip overlies the mid SVC. The visualized bony structures of the thorax are intact. Oblique views of the left ribs were obtained with a radiopaque BB localizing the area of patient concern. No underlying left-sided rib fracture is evident. IMPRESSION: No acute cardiopulmonary findings. No evidence for lower posterior left-sided rib fracture. Electronically Signed   By: Misty Stanley M.D.   On: 05/01/2015 09:47   I have personally reviewed and evaluated these images and lab results as part of my medical decision-making.   EKG Interpretation None      MDM   Final diagnoses:  Back strain, initial encounter   On examination, patient's pain localized to the lowest margins of the thoracic ribs and the paraspinous muscle bodies of the lumbar spine. X-ray does not show lower rib fracture and patient is not experiencing dyspnea. UA does not show any hemoglobin to suggest renal contusion. She has otherwise been well and main problem is pain with weightbearing and certain positions. There was no indication this was actually was in the hip joint or pelvic girdle. Patient has tolerated oral narcotics in the past. She is given Vicodin to take on as needed basis. She is counseled to use Colace if she has any tendency toward constipation.    Charlesetta Shanks, MD 05/01/15 1137

## 2015-05-01 NOTE — Discharge Instructions (Signed)

## 2015-05-01 NOTE — ED Notes (Signed)
Pt attempted to give urine sample but missed hat that was placed by tech. Pt will try again later

## 2015-05-09 NOTE — Progress Notes (Signed)
Hematology oncology clinic follow-up.  Date of service  .04/29/2015  Patient Care Team: [name], MD as PCP - General (Internal Medicine)   Urologist:Dr. 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  Diagnosis:  Widely metastatic transitional cell carcinoma from the right renal pelvis.  Treatment -Atezolizumab IV q3weeks status post 7 cycles  HISTORY OF PRESENTING ILLNESS: Please see my previous note for details of initial presentation.  INTERVAL HISTORY  [name] is here for for her for her scheduled follow-up prior to her eighth cycle of Atezolizumab. She notes that she has been feeling well with good energy levels. Good appetite. No diarrhea. No nausea or vomiting. No further rectal bleeding. No abdominal pain. No mouth sores.  She previously had a cough and was given Zpak for concerns regarding the possibility of bronchitis. Her cough seemed like it was getting better and then got worse again. PET/CT with concerns for early pneumonia. Given a prescription for Vantin to take for treatment of pneumonia.  MEDICAL HISTORY:  Past Medical History  Diagnosis Date  . Hypertension   . Edema leg     feet and ankles  . Headache(784.0)   . Esophagus disorder     Had esophagus stretched  . Arthritis      knees 03-10-12 had Cortisone injection  . Occipital neuralgia   . 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 DDD (degenerative disc disease), cervical   . GERD (gastroesophageal reflux disease)     Benign stricture dilated in 2011  . Occipital neuralgia     Related to cervical degenerative disc disease  . Gout     Patient notes she has possible gout but has not been on chronic medications for this  . Peptic ulcer disease     Previous history in the remote past  . Ulcerative proctitis ([place]) 2014  . Cancer [facility]) june 2016    metastatic    SURGICAL HISTORY: Past  Surgical History  Procedure Laterality Date  . Cholecystectomy    . Appendectomy    . Abdominal hysterectomy    . Dilation and curettage of uterus    . Neck surgery      20 years ago  . Esophagogastroduodenoscopy    . Colonoscopy with propofol  03/25/2012    Procedure: COLONOSCOPY WITH PROPOFOL;  Surgeon: Garlan Fair, MD;  Location: [facility];  Service: Endoscopy;  Laterality: N/A;  . Flexible sigmoidoscopy N/A 04/26/2014    Procedure: FLEXIBLE SIGMOIDOSCOPY - UnSedated;  Surgeon: Garlan Fair, MD;  Location: [facility];  Service: Endoscopy;  Laterality: N/A;  . Esophageal dilation      For benign stricture in 2011  . Total abdominal hysterectomy w/ bilateral salpingoophorectomy      At age 29 due to miscarriage and retained products of conception causing significant uterine bleeding. Patient has been on estrogen replacement therapy since then.  . Flexible sigmoidoscopy N/A 01/24/2015    Procedure: FLEXIBLE SIGNMOIDOSCOPY W/ FLEET ENEMIA;  Surgeon: Garlan Fair, MD;  Location: [facility];  Service: Endoscopy;  Laterality: N/A;    SOCIAL HISTORY: Social History   Social History  . Marital Status: Married    Spouse Name: N/A  . Number of Children: N/A  . Years of Education: N/A   Occupational History  . Not on file.   Social History Main Topics  . Smoking status: Former Smoker    Quit date: 11/01/1983  . Smokeless tobacco:  Never Used  . Alcohol Use: Yes  . Drug Use: No  . Sexual Activity: Not on file   Other Topics Concern  . Not on file   Social History Narrative  Retired as Conservation officer, nature for [employer]. She is very well spoken and intelligent individual and exhibits a keen knowledge of her medical conditions.  FAMILY HISTORY: Family History  Problem Relation Age of Onset  . Lung cancer Sister   . Prostate cancer Brother   . Uterine cancer Maternal Aunt   . Breast cancer Paternal Grandmother   . Hodgkin's lymphoma Sister      ALLERGIES:  is allergic to nitrofurantoin; indomethacin; and lisinopril.  MEDICATIONS:  Current Outpatient Prescriptions  Medication Sig Dispense Refill  . acetaminophen (TYLENOL) 650 MG CR tablet Take 1,300 mg by mouth 2 (two) times daily.    Marland Kitchen amLODipine (NORVASC) 5 MG tablet Take 5 mg by mouth daily.    Marland Kitchen azithromycin (ZITHROMAX Z-PAK) 250 MG tablet Take 2 tablets (500mg ) by mouth x 1 day.  Then take 1 tablet (250mg ) by mouth x 4 days. (Patient not taking: Reported on 05/01/2015) 6 each 0  . CANASA 1000 MG suppository Place 1,000 mg rectally daily as needed.   11  . cefpodoxime (VANTIN) 200 MG tablet Take 1 tablet (200 mg total) by mouth 2 (two) times daily. 28 tablet 0  . estradiol (ESTRACE) 0.5 MG tablet Take 0.5 mg by mouth daily.  1  . HYDROcodone-acetaminophen (NORCO/VICODIN) 5-325 MG tablet Take 1-2 tablets by mouth every 4 (four) hours as needed for moderate pain or severe pain. 20 tablet 0  . mirtazapine (REMERON) 15 MG tablet Take 1 tablet (15 mg total) by mouth at bedtime. 30 tablet 2  . ondansetron (ZOFRAN) 4 MG tablet Take 1 tablet (4 mg total) by mouth every 8 (eight) hours as needed for nausea or vomiting. 30 tablet 0  . OVER THE COUNTER MEDICATION Apply 1 application topically. [place] dream over the counter cream for arthritis    . oxyCODONE (OXY IR/ROXICODONE) 5 MG immediate release tablet Take 0.5-1 tablets (2.5-5 mg total) by mouth every 4 (four) hours as needed for moderate pain, severe pain or breakthrough pain. 60 tablet 0  . PRESCRIPTION MEDICATION Chemo card    . senna-docusate (SENNA S) 8.6-50 MG per tablet Take 2 tablets by mouth at bedtime. 60 tablet 1  . triamcinolone ointment (KENALOG) 0.5 % Apply 1 application topically 2 (two) times daily. (Patient taking differently: Apply 1 application topically 2 (two) times daily as needed (for rash). ) 30 g 0   Current Facility-Administered Medications  Medication Dose Route Frequency Provider Last Rate Last Dose   . albuterol (PROVENTIL) (2.5 MG/3ML) 0.083% nebulizer solution 2.5 mg  2.5 mg Nebulization Once [name], MD       REVIEW OF SYSTEMS:    10 point review of systems done and is negative except as noted above  PHYSICAL EXAMINATION: ECOG PERFORMANCE STATUS: 1  Filed Vitals:   04/29/15 1124  BP: 165/78  Pulse: 74  Temp: 98.1 F (36.7 C)  Resp: 18   Filed Weights   04/29/15 1124  Weight: 126 lb 8 oz (57.38 kg)   GENERAL elderly Caucasian female, alert, no distress and comfortable SKIN: Macular reddish colored spotty rash over upper extremities and one spot on the face. Mildly pruritic nonpainful. EYES: normal, conjunctiva are pink and non-injected, sclera clear OROPHARYNX:no exudate, no erythema and lips, buccal mucosa, and tongue normal  NECK:  supple, thyroid normal size, non-tender, without nodularity LYMPH:  no palpable lymphadenopathy in the cervical, axillary or inguinal LUNGS: clear to auscultation and percussion with normal breathing effort HEART: regular rate & rhythm and no murmurs and no lower extremity edema ABDOMEN:abdomen soft, non-tender and normal bowel sounds Musculoskeletal:no cyanosis of digits and no clubbing  PSYCH: alert & oriented x 3 with fluent speech NEURO: no focal motor/sensory deficits  LABORATORY DATA:  . CBC Latest Ref Rng 04/29/2015 04/08/2015 03/18/2015  WBC 3.9 - 10.3 10e3/uL 5.0 4.2 4.2  Hemoglobin 11.6 - 15.9 g/dL 11.8 10.4(L) 10.3(L)  Hematocrit 34.8 - 46.6 % 37.2 33.0(L) 32.5(L)  Platelets 145 - 400 10e3/uL 328 272 310      Recent Labs  10/16/14 1611 10/17/14 0353  03/18/15 0943 04/08/15 0932 04/29/15 1120  NA 135 135  < > 141 140 141  K 4.5 3.5  < > 4.0 4.0 4.5  CL 101 101  --   --   --   --   CO2 24 24  < > 24 23 25   GLUCOSE 174* 130*  < > 109 76 88  BUN 21* 20  < > 15.8 17.8 18.0  CREATININE 1.52* 1.25*  < > 1.2* 0.9 1.2*  CALCIUM 9.8 8.9  < > 8.9 8.7 9.4  GFRNONAA 29* 37*  --   --   --   --   GFRAA 34* 43*  --    --   --   --   PROT  --   --   < > 6.7 6.8 7.8  ALBUMIN  --   --   < > 3.1* 3.4* 3.7  AST  --   --   < > 18 19 19   ALT  --   --   < > 10 9 9   ALKPHOS  --   --   < > 45 43 51  BILITOT  --   --   < > 0.35 0.36 0.40  < > = values in this interval not displayed.    RADIOGRAPHIC STUDIES:  PET/CT 11/24/2014:  IMPRESSION: 1. Multi focal hypermetabolism within nodal stations, pulmonary nodules, bones and liver. Most consistent with metastatic disease versus less likely lymphoma. 2. Hypermetabolism corresponding to soft tissue fullness in the right renal pelvis. This is suspicious for transitional cell carcinoma (possibly the primary). Infiltrative renal cell carcinoma or lymphoma felt less likely.   PET/CT 01/28/2015: IMPRESSION: 1. Interval significant response to therapy. The widespread hypermetabolic metastatic disease involving the liver, lungs and bones has nearly completely resolved, without residual abnormal metabolic activity. There are persistent morphologic changes within the liver and bones, probably related to treated disease. 2. New diffuse pancreatic activity. This is probably a manifestation of pancreatitis, possibly secondary to Atezolizumab adverse reaction. Laboratory correlation recommended. 3. Decreased right renal function, likely related to collecting system obstruction from the known transitional cell carcinoma.  IMPRESSION: 1. No evidence of metastatic disease. Hepatic and osseous lesions do not show abnormal hypermetabolism. 2. Scattered patchy peribronchovascular nodularity, ground-glass and slight consolidation in the lungs, new from prior exam and mildly hypermetabolic. Infectious bronchiolitis/ bronchopneumonia is favored. 3. Three-vessel coronary artery calcification. 4. Chronic appearing right hydronephrosis.   Electronically Signed  By: Lorin Picket M.D.  On: 04/27/2015 10:01   ASSESSMENT & PLAN:   82 year old Caucasian female  with  #1 Right Renal pelvis metastatic transitional cell carcinoma.  PET scan reveals metastases to the lungs, liver, multiple nodal stations and bones. She is status post 6  cycles of Atezolizumab. PET scan done after 7 cycles of treatment for restaging disease showed significant response to treatment with no evidence of active disease #2 Urethral pains and bladder spasms/ discomfort. Urine cultures negative. Symptoms have now completely resolved. #3 skin rash likely related to her Atezolizumab. Was grade 1 and is now much improved and nearly resolved with topical triamcinolone. #4  S/p Mucous and slight blood in stools. No diarrhea C. difficile negative. Symptoms resolved with mesalamine suppositories that she had been started on by GI .currently off these with stable symptoms. Plan  -Labs stable and patient is appropriate to receive 8th cycle of Atezolizumab. Today. -Continue current pain management.needing minimal pain medications at this time. -Continue Xgeva every 6 weeks -Continue triamcinolone ointment for her skin rash as needed. -Continue mesalamine suppositories as per Dr. Hassell Done for ulcerative proctitis.  -Will plan to treat the patient with 1200mg  Q3weeks until treatment progression or residual toxicities. -Patient has been counseled to seek immediate attention if she has increasing abdominal pain, GI bleeding or diarrhea, nausea vomiting  #6 Normocytic normochromic anemia likely due to anemia of chronic disease from her metastatic malignancy and chronic kidney disease and some gi bleeding that  has now resolved Hgb has improved to 11.8 -No indication of blood transfusion at this time  -We will monitor and treat with when necessary blood transfusions if symptomatic.  #7 history of hypertension patient's blood pressure has been lower given associated weight loss. Now that the patient has gained back some of her weight and is eating well might need to potentially restart on her  Amlodipine if BP remains persistently elevated.  #8 CAP - patient was empirically treated with Azithromycin. -given symptoms and PET/CT findings concerning for possible pneumonia - given patient prescription for Cefpodoxime 200mg  po BID x 14 days. -she was counselled to seek immediate attention for high grade fevers/ increasing shortness of breath or chest pain.  .No orders of the defined types were placed in this encounter.    All questions were answered. The patient knows to call the clinic with any problems, questions or concerns. I spent 20 minutes counseling the patient face to face. The total time spent in the appointment was 25 minutes and more than 50% was on counseling.   Sullivan Lone MD [place] [facility] Phys[facility]  (Office):       [phone] (Work cell):  [phone] (Fax):           [phone]

## 2015-05-12 ENCOUNTER — Telehealth: Payer: Self-pay

## 2015-05-12 NOTE — Telephone Encounter (Signed)
Pt reports worsening cough, interrupting sleep and painful.  Productive cough with brown sputum.  Pt reports she did not take vantin today - she was concerned it was causing cough.  Pt was also worried that cancer was causing the cough.  Writer advised patient that: her recent scans showed suspicion for infection in her lungs not cancer, that she needed to consistently take her medication so that bacteria do not become resistant, that the medication was prescribed for the cough and she needed to take it.  Pt is not taking anything for the symptoms of the cough.  Writer advised pt to take Delsym and Mucinex BID.  Pt voiced understanding.

## 2015-05-19 ENCOUNTER — Other Ambulatory Visit: Payer: Self-pay | Admitting: *Deleted

## 2015-05-19 DIAGNOSIS — C651 Malignant neoplasm of right renal pelvis: Secondary | ICD-10-CM

## 2015-05-20 ENCOUNTER — Other Ambulatory Visit (HOSPITAL_BASED_OUTPATIENT_CLINIC_OR_DEPARTMENT_OTHER): Payer: Medicare Other

## 2015-05-20 ENCOUNTER — Encounter: Payer: Self-pay | Admitting: Hematology

## 2015-05-20 ENCOUNTER — Ambulatory Visit (HOSPITAL_BASED_OUTPATIENT_CLINIC_OR_DEPARTMENT_OTHER): Payer: Medicare Other | Admitting: Hematology

## 2015-05-20 ENCOUNTER — Telehealth: Payer: Self-pay | Admitting: Hematology

## 2015-05-20 ENCOUNTER — Ambulatory Visit (HOSPITAL_BASED_OUTPATIENT_CLINIC_OR_DEPARTMENT_OTHER): Payer: Medicare Other

## 2015-05-20 ENCOUNTER — Ambulatory Visit: Payer: Medicare Other

## 2015-05-20 VITALS — BP 159/67 | HR 77 | Temp 97.7°F | Resp 18 | Ht 61.0 in | Wt 125.5 lb

## 2015-05-20 DIAGNOSIS — Z5112 Encounter for antineoplastic immunotherapy: Secondary | ICD-10-CM

## 2015-05-20 DIAGNOSIS — C651 Malignant neoplasm of right renal pelvis: Secondary | ICD-10-CM

## 2015-05-20 DIAGNOSIS — C7951 Secondary malignant neoplasm of bone: Secondary | ICD-10-CM

## 2015-05-20 DIAGNOSIS — C778 Secondary and unspecified malignant neoplasm of lymph nodes of multiple regions: Secondary | ICD-10-CM | POA: Diagnosis not present

## 2015-05-20 DIAGNOSIS — C787 Secondary malignant neoplasm of liver and intrahepatic bile duct: Secondary | ICD-10-CM | POA: Diagnosis not present

## 2015-05-20 DIAGNOSIS — C659 Malignant neoplasm of unspecified renal pelvis: Secondary | ICD-10-CM

## 2015-05-20 DIAGNOSIS — D649 Anemia, unspecified: Secondary | ICD-10-CM

## 2015-05-20 DIAGNOSIS — C7800 Secondary malignant neoplasm of unspecified lung: Secondary | ICD-10-CM

## 2015-05-20 DIAGNOSIS — Z95828 Presence of other vascular implants and grafts: Secondary | ICD-10-CM

## 2015-05-20 LAB — COMPREHENSIVE METABOLIC PANEL
ALBUMIN: 3.4 g/dL — AB (ref 3.5–5.0)
ALK PHOS: 53 U/L (ref 40–150)
ALT: 9 U/L (ref 0–55)
AST: 16 U/L (ref 5–34)
Anion Gap: 8 mEq/L (ref 3–11)
BILIRUBIN TOTAL: 0.42 mg/dL (ref 0.20–1.20)
BUN: 15.4 mg/dL (ref 7.0–26.0)
CALCIUM: 9.7 mg/dL (ref 8.4–10.4)
CO2: 27 mEq/L (ref 22–29)
Chloride: 105 mEq/L (ref 98–109)
Creatinine: 1 mg/dL (ref 0.6–1.1)
EGFR: 50 mL/min/{1.73_m2} — AB (ref >90)
GLUCOSE: 72 mg/dL (ref 70–140)
POTASSIUM: 4.3 meq/L (ref 3.5–5.1)
SODIUM: 139 meq/L (ref 136–145)
TOTAL PROTEIN: 7.6 g/dL (ref 6.4–8.3)

## 2015-05-20 LAB — CBC WITH DIFFERENTIAL/PLATELET
BASO%: 0.2 % (ref 0.0–2.0)
BASOS ABS: 0 10*3/uL (ref 0.0–0.1)
EOS ABS: 0.1 10*3/uL (ref 0.0–0.5)
EOS%: 1.9 % (ref 0.0–7.0)
HEMATOCRIT: 34.2 % — AB (ref 34.8–46.6)
HEMOGLOBIN: 10.8 g/dL — AB (ref 11.6–15.9)
LYMPH#: 2.1 10*3/uL (ref 0.9–3.3)
LYMPH%: 39.3 % (ref 14.0–49.7)
MCH: 29.3 pg (ref 25.1–34.0)
MCHC: 31.6 g/dL (ref 31.5–36.0)
MCV: 92.9 fL (ref 79.5–101.0)
MONO#: 1.1 10*3/uL — AB (ref 0.1–0.9)
MONO%: 20.4 % — AB (ref 0.0–14.0)
NEUT%: 38.2 % — ABNORMAL LOW (ref 38.4–76.8)
NEUTROS ABS: 2.1 10*3/uL (ref 1.5–6.5)
PLATELETS: 321 10*3/uL (ref 145–400)
RBC: 3.68 10*6/uL — ABNORMAL LOW (ref 3.70–5.45)
RDW: 16 % — AB (ref 11.2–14.5)
WBC: 5.4 10*3/uL (ref 3.9–10.3)

## 2015-05-20 MED ORDER — SODIUM CHLORIDE 0.9% FLUSH
10.0000 mL | INTRAVENOUS | Status: DC | PRN
  Administered 2015-05-20: 10 mL via INTRAVENOUS
  Filled 2015-05-20: qty 10

## 2015-05-20 MED ORDER — HEPARIN SOD (PORK) LOCK FLUSH 100 UNIT/ML IV SOLN
500.0000 [IU] | Freq: Once | INTRAVENOUS | Status: AC | PRN
  Administered 2015-05-20: 500 [IU]
  Filled 2015-05-20: qty 5

## 2015-05-20 MED ORDER — SODIUM CHLORIDE 0.9 % IJ SOLN
10.0000 mL | INTRAMUSCULAR | Status: DC | PRN
  Administered 2015-05-20: 10 mL
  Filled 2015-05-20: qty 10

## 2015-05-20 MED ORDER — SODIUM CHLORIDE 0.9 % IV SOLN
1200.0000 mg | Freq: Once | INTRAVENOUS | Status: AC
  Administered 2015-05-20: 1200 mg via INTRAVENOUS
  Filled 2015-05-20: qty 20

## 2015-05-20 MED ORDER — SODIUM CHLORIDE 0.9 % IV SOLN
Freq: Once | INTRAVENOUS | Status: AC
  Administered 2015-05-20: 14:00:00 via INTRAVENOUS

## 2015-05-20 NOTE — Patient Instructions (Signed)
[facility] Discharge Instructions for Patients Receiving Chemotherapy  Today you received the following chemotherapy agents: Tencentriq  To help prevent nausea and vomiting after your treatment, we encourage you to take your nausea medication as directed.    If you develop nausea and vomiting that is not controlled by your nausea medication, call the clinic.   BELOW ARE SYMPTOMS THAT SHOULD BE REPORTED IMMEDIATELY:  *FEVER GREATER THAN 100.5 F  *CHILLS WITH OR WITHOUT FEVER  NAUSEA AND VOMITING THAT IS NOT CONTROLLED WITH YOUR NAUSEA MEDICATION  *UNUSUAL SHORTNESS OF BREATH  *UNUSUAL BRUISING OR BLEEDING  TENDERNESS IN MOUTH AND THROAT WITH OR WITHOUT PRESENCE OF ULCERS  *URINARY PROBLEMS  *BOWEL PROBLEMS  UNUSUAL RASH Items with * indicate a potential emergency and should be followed up as soon as possible.  Feel free to call the clinic you have any questions or concerns. The clinic phone number is [phone].  Please show the CHEMO ALERT CARD at check-in to the Emergency Department and triage nurse.      

## 2015-05-20 NOTE — Telephone Encounter (Signed)
per pof to sch pt appt-sent MW email to move trmt to coordinate w/MD-pt to get updated copy b4 leaving °

## 2015-05-20 NOTE — Patient Instructions (Signed)

## 2015-05-22 DIAGNOSIS — C799 Secondary malignant neoplasm of unspecified site: Secondary | ICD-10-CM | POA: Insufficient documentation

## 2015-05-22 DIAGNOSIS — C787 Secondary malignant neoplasm of liver and intrahepatic bile duct: Secondary | ICD-10-CM | POA: Insufficient documentation

## 2015-05-22 NOTE — Progress Notes (Signed)
Hematology oncology clinic follow-up.  Date of service  .05/20/2015  Patient Care Team: [name], MD as PCP - General (Internal Medicine)   Urologist:Dr. 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  Diagnosis:  Widely metastatic transitional cell carcinoma from the right renal pelvis.  Treatment -Atezolizumab IV q3weeks status post 8 cycles here for her 9th cycle today.  HISTORY OF PRESENTING ILLNESS: Please see my previous note for details of initial presentation.  INTERVAL HISTORY  Tiffany [name] is here for her scheduled follow-up prior to her 9th cycle of Atezolizumab. She notes that she is eating well and feels good overall. Her cough is much improved but she still notes a post nasal drip. No fevers/chills/chest pain or shortness of breath. She notes that her insurance company has provided a home health RN to help with evaluation at home. No other acute new symptoms. No diarrhea, no rectal bleeding. No other overt prohibitive toxicities from the Atezolizumab. Agreeable to continue treatments. She is very grateful for all the cares so far and notes that she did no expected to be feeling as well as she does currently. Recently had her [birth year] on 05/07/2013.   MEDICAL HISTORY:  Past Medical History  Diagnosis Date  . Hypertension   . Edema leg     feet and ankles  . Headache(784.0)   . Esophagus disorder     Had esophagus stretched  . Arthritis      knees 03-10-12 had Cortisone injection  . Occipital neuralgia   . Ulcerative proctitis ([place])   . DDD (degenerative disc disease), cervical   . GERD (gastroesophageal reflux disease)     Benign stricture dilated in 2011  . Occipital neuralgia     Related to cervical degenerative disc disease  . Gout     Patient notes she has possible gout but has not been on chronic medications for this  . Peptic ulcer disease      Previous history in the remote past  . Ulcerative proctitis ([place]) 2014  . Cancer [facility]) june 2016    metastatic    SURGICAL HISTORY: Past Surgical History  Procedure Laterality Date  . Cholecystectomy    . Appendectomy    . Abdominal hysterectomy    . Dilation and curettage of uterus    . Neck surgery      20 years ago  . Esophagogastroduodenoscopy    . Colonoscopy with propofol  03/25/2012    Procedure: COLONOSCOPY WITH PROPOFOL;  Surgeon: Garlan Fair, MD;  Location: [facility];  Service: Endoscopy;  Laterality: N/A;  . Flexible sigmoidoscopy N/A 04/26/2014    Procedure: FLEXIBLE SIGMOIDOSCOPY - UnSedated;  Surgeon: Garlan Fair, MD;  Location: [facility];  Service: Endoscopy;  Laterality: N/A;  . Esophageal dilation      For benign stricture in 2011  . Total abdominal hysterectomy w/ bilateral salpingoophorectomy      At age 15 due to miscarriage and retained products of conception causing significant uterine bleeding. Patient has been on estrogen replacement therapy since then.  . Flexible sigmoidoscopy N/A 01/24/2015    Procedure: FLEXIBLE SIGNMOIDOSCOPY W/ FLEET ENEMIA;  Surgeon: Garlan Fair, MD;  Location: [facility];  Service: Endoscopy;  Laterality: N/A;    SOCIAL HISTORY: Social History   Social History  . Marital Status: Married    Spouse Name: N/A  . Number of Children: N/A  . Years of Education: N/A  Occupational History  . Not on file.   Social History Main Topics  . Smoking status: Former Smoker    Quit date: 11/01/1983  . Smokeless tobacco: Never Used  . Alcohol Use: Yes  . Drug Use: No  . Sexual Activity: Not on file   Other Topics Concern  . Not on file   Social History Narrative  Retired as Conservation officer, nature for [employer]. She is very well spoken and intelligent individual and exhibits a keen knowledge of her medical conditions.  FAMILY HISTORY: Family History  Problem Relation Age of Onset  . Lung  cancer Sister   . Prostate cancer Brother   . Uterine cancer Maternal Aunt   . Breast cancer Paternal Grandmother   . Hodgkin's lymphoma Sister     ALLERGIES:  is allergic to nitrofurantoin; indomethacin; and lisinopril.  MEDICATIONS:  Current Outpatient Prescriptions  Medication Sig Dispense Refill  . acetaminophen (TYLENOL) 650 MG CR tablet Take 1,300 mg by mouth 2 (two) times daily.    Marland Kitchen amLODipine (NORVASC) 5 MG tablet Take 5 mg by mouth daily.    Marland Kitchen azithromycin (ZITHROMAX Z-PAK) 250 MG tablet Take 2 tablets (500mg ) by mouth x 1 day.  Then take 1 tablet (250mg ) by mouth x 4 days. (Patient not taking: Reported on 05/01/2015) 6 each 0  . CANASA 1000 MG suppository Place 1,000 mg rectally daily as needed.   11  . estradiol (ESTRACE) 0.5 MG tablet Take 0.5 mg by mouth daily.  1  . HYDROcodone-acetaminophen (NORCO/VICODIN) 5-325 MG tablet Take 1-2 tablets by mouth every 4 (four) hours as needed for moderate pain or severe pain. 20 tablet 0  . mirtazapine (REMERON) 15 MG tablet Take 1 tablet (15 mg total) by mouth at bedtime. 30 tablet 2  . ondansetron (ZOFRAN) 4 MG tablet Take 1 tablet (4 mg total) by mouth every 8 (eight) hours as needed for nausea or vomiting. 30 tablet 0  . OVER THE COUNTER MEDICATION Apply 1 application topically. [place] dream over the counter cream for arthritis    . oxyCODONE (OXY IR/ROXICODONE) 5 MG immediate release tablet Take 0.5-1 tablets (2.5-5 mg total) by mouth every 4 (four) hours as needed for moderate pain, severe pain or breakthrough pain. 60 tablet 0  . PRESCRIPTION MEDICATION Chemo card    . senna-docusate (SENNA S) 8.6-50 MG per tablet Take 2 tablets by mouth at bedtime. 60 tablet 1  . triamcinolone ointment (KENALOG) 0.5 % Apply 1 application topically 2 (two) times daily. (Patient taking differently: Apply 1 application topically 2 (two) times daily as needed (for rash). ) 30 g 0   No current facility-administered medications for this visit.    REVIEW OF SYSTEMS:    10 point review of systems done and is negative except as noted above  PHYSICAL EXAMINATION: ECOG PERFORMANCE STATUS: 1  Filed Vitals:   05/20/15 1152  BP: 159/67  Pulse: 77  Temp: 97.7 F (36.5 C)  Resp: 18   Filed Weights   05/20/15 1152  Weight: 125 lb 8 oz (56.926 kg)   GENERAL elderly Caucasian female, alert, no distress and comfortable SKIN: resolved skin rashes EYES: normal, conjunctiva are pink and non-injected, sclera clear OROPHARYNX:no exudate, no erythema and lips, buccal mucosa, and tongue normal  NECK: supple, thyroid normal size, non-tender, without nodularity LYMPH:  no palpable lymphadenopathy in the cervical, axillary or inguinal LUNGS: clear to auscultation, few scattered rhonci no rales HEART: regular rate & rhythm and no murmurs and no  lower extremity edema ABDOMEN:abdomen soft, non-tender and normal bowel sounds Musculoskeletal:no cyanosis of digits and no clubbing  PSYCH: alert & oriented x 3 with fluent speech NEURO: no focal motor/sensory deficits  LABORATORY DATA:  . CBC Latest Ref Rng 05/20/2015 04/29/2015 04/08/2015  WBC 3.9 - 10.3 10e3/uL 5.4 5.0 4.2  Hemoglobin 11.6 - 15.9 g/dL 10.8(L) 11.8 10.4(L)  Hematocrit 34.8 - 46.6 % 34.2(L) 37.2 33.0(L)  Platelets 145 - 400 10e3/uL 321 328 272      Recent Labs  10/16/14 1611 10/17/14 0353  04/08/15 0932 04/29/15 1120 05/20/15 1024  NA 135 135  < > 140 141 139  K 4.5 3.5  < > 4.0 4.5 4.3  CL 101 101  --   --   --   --   CO2 24 24  < > 23 25 27   GLUCOSE 174* 130*  < > 76 88 72  BUN 21* 20  < > 17.8 18.0 15.4  CREATININE 1.52* 1.25*  < > 0.9 1.2* 1.0  CALCIUM 9.8 8.9  < > 8.7 9.4 9.7  GFRNONAA 29* 37*  --   --   --   --   GFRAA 34* 43*  --   --   --   --   PROT  --   --   < > 6.8 7.8 7.6  ALBUMIN  --   --   < > 3.4* 3.7 3.4*  AST  --   --   < > 19 19 16   ALT  --   --   < > 9 9 9   ALKPHOS  --   --   < > 43 51 53  BILITOT  --   --   < > 0.36 0.40 0.42  < > =  values in this interval not displayed.    RADIOGRAPHIC STUDIES:  PET/CT 04/27/2015: IMPRESSION: 1. No evidence of metastatic disease. Hepatic and osseous lesions do not show abnormal hypermetabolism. 2. Scattered patchy peribronchovascular nodularity, ground-glass and slight consolidation in the lungs, new from prior exam and mildly hypermetabolic. Infectious bronchiolitis/ bronchopneumonia is favored. 3. Three-vessel coronary artery calcification. 4. Chronic appearing right hydronephrosis.   Electronically Signed  By: Lorin Picket M.D.  On: 04/27/2015 10:01   ASSESSMENT & PLAN:   82 year old Caucasian female with  #1 Right Renal pelvis metastatic transitional cell carcinoma.  PET scan reveals metastases to the lungs, liver, multiple nodal stations and bones. She is status post 8 cycles of Atezolizumab. PET scan done after 8 cycles of treatment for restaging disease showed significant response to treatment with no evidence of active disease #2 Urethral pains and bladder spasms/ discomfort. Urine cultures negative. Symptoms have now completely resolved. #3 skin rash likely related to her Atezolizumab. Was grade 1 and is now much improved and nearly resolved with topical triamcinolone. #4  S/p Mucous and slight blood in stools. No diarrhea C. difficile negative. Symptoms resolved with mesalamine suppositories that she had been started on by GI .currently off these with stable symptoms. Plan  -Labs stable and patient is appropriate to receive 9th cycle of Atezolizumab. Today. -Continue current pain management.needing minimal pain medications at this time. -Continue Xgeva every 6 weeks -Continue triamcinolone ointment for her skin rash as needed. -Continue mesalamine suppositories as per Dr. Hassell Done for ulcerative proctitis- using on an as needed basis and hasnt needed it recently. -Will plan to treat the patient with 1200mg  Q3weeks until treatment progression or residual  toxicities. -Patient has been counseled to seek immediate  attention if she has increasing abdominal pain, GI bleeding or diarrhea, nausea vomiting -  #6 Normocytic normochromic anemia likely due to anemia of chronic disease from her metastatic malignancy and chronic kidney disease and some gi bleeding that  has now resolved Hgb stable. -No indication of blood transfusion at this time  -We will monitor and treat with when necessary blood transfusions if symptomatic.  #7 history of hypertension patient's blood pressure has been lower given associated weight loss. Now that the patient has gained back some of her weight and is eating well with some increase in BP -restarted in Amlodipine 5mg  po daily  #8 CAP -s/p treatment with Azithromycin x 5 days and Cefpodoxime 200mg  po BID x 14 days. Improved cough. No fevers/chills. Some post nasal drip and minimal remaining cough. Plan -using over the counter expectorants -she was counselled to seek immediate attention for fevers/ increasing shortness of breath or chest pain. -unlikely hypersensitivity pneumonitis given response to antibiotics.  RTC with Dr Irene Limbo in 3 weeks prior to next cycle of treatment with rpt labs  All questions were answered. The patient knows to call the clinic with any problems, questions or concerns. I spent 15 minutes counseling the patient face to face. The total time spent in the appointment was 20 minutes and more than 50% was on counseling.   Sullivan Lone MD [place] Hematology/Oncology Physician [facility]  (Office):       [phone] (Work cell):  [phone] (Fax):           [phone]

## 2015-06-10 ENCOUNTER — Other Ambulatory Visit: Payer: Self-pay | Admitting: *Deleted

## 2015-06-10 ENCOUNTER — Ambulatory Visit: Payer: Medicare Other

## 2015-06-10 ENCOUNTER — Telehealth: Payer: Self-pay | Admitting: *Deleted

## 2015-06-10 ENCOUNTER — Other Ambulatory Visit (HOSPITAL_BASED_OUTPATIENT_CLINIC_OR_DEPARTMENT_OTHER): Payer: Medicare Other

## 2015-06-10 ENCOUNTER — Ambulatory Visit (HOSPITAL_BASED_OUTPATIENT_CLINIC_OR_DEPARTMENT_OTHER): Payer: Medicare Other | Admitting: Hematology

## 2015-06-10 ENCOUNTER — Ambulatory Visit (HOSPITAL_BASED_OUTPATIENT_CLINIC_OR_DEPARTMENT_OTHER): Payer: Medicare Other

## 2015-06-10 ENCOUNTER — Telehealth: Payer: Self-pay | Admitting: Hematology

## 2015-06-10 VITALS — BP 137/62 | HR 77 | Temp 98.1°F | Resp 17 | Wt 125.0 lb

## 2015-06-10 DIAGNOSIS — C659 Malignant neoplasm of unspecified renal pelvis: Secondary | ICD-10-CM

## 2015-06-10 DIAGNOSIS — I10 Essential (primary) hypertension: Secondary | ICD-10-CM

## 2015-06-10 DIAGNOSIS — R059 Cough, unspecified: Secondary | ICD-10-CM

## 2015-06-10 DIAGNOSIS — C651 Malignant neoplasm of right renal pelvis: Secondary | ICD-10-CM

## 2015-06-10 DIAGNOSIS — C7951 Secondary malignant neoplasm of bone: Secondary | ICD-10-CM

## 2015-06-10 DIAGNOSIS — J479 Bronchiectasis, uncomplicated: Secondary | ICD-10-CM | POA: Insufficient documentation

## 2015-06-10 DIAGNOSIS — Z95828 Presence of other vascular implants and grafts: Secondary | ICD-10-CM

## 2015-06-10 DIAGNOSIS — J189 Pneumonia, unspecified organism: Secondary | ICD-10-CM

## 2015-06-10 DIAGNOSIS — Z5112 Encounter for antineoplastic immunotherapy: Secondary | ICD-10-CM

## 2015-06-10 DIAGNOSIS — D649 Anemia, unspecified: Secondary | ICD-10-CM

## 2015-06-10 DIAGNOSIS — R05 Cough: Secondary | ICD-10-CM | POA: Diagnosis not present

## 2015-06-10 LAB — CBC & DIFF AND RETIC
BASO%: 0.4 % (ref 0.0–2.0)
BASOS ABS: 0 10*3/uL (ref 0.0–0.1)
EOS%: 1.3 % (ref 0.0–7.0)
Eosinophils Absolute: 0.1 10*3/uL (ref 0.0–0.5)
HEMATOCRIT: 33.3 % — AB (ref 34.8–46.6)
HGB: 10.5 g/dL — ABNORMAL LOW (ref 11.6–15.9)
Immature Retic Fract: 11.6 % — ABNORMAL HIGH (ref 1.60–10.00)
LYMPH%: 37.1 % (ref 14.0–49.7)
MCH: 29.7 pg (ref 25.1–34.0)
MCHC: 31.5 g/dL (ref 31.5–36.0)
MCV: 94.1 fL (ref 79.5–101.0)
MONO#: 0.8 10*3/uL (ref 0.1–0.9)
MONO%: 15.8 % — AB (ref 0.0–14.0)
NEUT#: 2.2 10*3/uL (ref 1.5–6.5)
NEUT%: 45.4 % (ref 38.4–76.8)
PLATELETS: 322 10*3/uL (ref 145–400)
RBC: 3.54 10*6/uL — AB (ref 3.70–5.45)
RDW: 15.4 % — ABNORMAL HIGH (ref 11.2–14.5)
RETIC CT ABS: 54.52 10*3/uL (ref 33.70–90.70)
Retic %: 1.54 % (ref 0.70–2.10)
WBC: 4.7 10*3/uL (ref 3.9–10.3)
lymph#: 1.8 10*3/uL (ref 0.9–3.3)

## 2015-06-10 LAB — COMPREHENSIVE METABOLIC PANEL
ANION GAP: 7 meq/L (ref 3–11)
AST: 15 U/L (ref 5–34)
Albumin: 3.2 g/dL — ABNORMAL LOW (ref 3.5–5.0)
Alkaline Phosphatase: 45 U/L (ref 40–150)
BUN: 17.2 mg/dL (ref 7.0–26.0)
CALCIUM: 9.1 mg/dL (ref 8.4–10.4)
CHLORIDE: 108 meq/L (ref 98–109)
CO2: 25 meq/L (ref 22–29)
CREATININE: 1 mg/dL (ref 0.6–1.1)
EGFR: 50 mL/min/{1.73_m2} — AB (ref >90)
Glucose: 101 mg/dl (ref 70–140)
Potassium: 4.2 mEq/L (ref 3.5–5.1)
Sodium: 140 mEq/L (ref 136–145)
Total Bilirubin: 0.39 mg/dL (ref 0.20–1.20)
Total Protein: 7.3 g/dL (ref 6.4–8.3)

## 2015-06-10 MED ORDER — HEPARIN SOD (PORK) LOCK FLUSH 100 UNIT/ML IV SOLN
500.0000 [IU] | Freq: Once | INTRAVENOUS | Status: AC | PRN
  Administered 2015-06-10: 500 [IU]
  Filled 2015-06-10: qty 5

## 2015-06-10 MED ORDER — SODIUM CHLORIDE 0.9 % IV SOLN
1200.0000 mg | Freq: Once | INTRAVENOUS | Status: AC
  Administered 2015-06-10: 1200 mg via INTRAVENOUS
  Filled 2015-06-10: qty 20

## 2015-06-10 MED ORDER — SODIUM CHLORIDE 0.9 % IV SOLN
Freq: Once | INTRAVENOUS | Status: AC
  Administered 2015-06-10: 11:00:00 via INTRAVENOUS

## 2015-06-10 MED ORDER — SODIUM CHLORIDE 0.9% FLUSH
10.0000 mL | INTRAVENOUS | Status: DC | PRN
  Administered 2015-06-10: 10 mL via INTRAVENOUS
  Filled 2015-06-10: qty 10

## 2015-06-10 MED ORDER — SODIUM CHLORIDE 0.9 % IJ SOLN
10.0000 mL | INTRAMUSCULAR | Status: DC | PRN
  Administered 2015-06-10: 10 mL
  Filled 2015-06-10: qty 10

## 2015-06-10 MED ORDER — DENOSUMAB 120 MG/1.7ML ~~LOC~~ SOLN
120.0000 mg | Freq: Once | SUBCUTANEOUS | Status: AC
  Administered 2015-06-10: 120 mg via SUBCUTANEOUS
  Filled 2015-06-10: qty 1.7

## 2015-06-10 NOTE — Patient Instructions (Signed)
[facility] Discharge Instructions for Patients Receiving Chemotherapy  Today you received the following chemotherapy agents: Tecentriq  To help prevent nausea and vomiting after your treatment, we encourage you to take your nausea medication as directed.    If you develop nausea and vomiting that is not controlled by your nausea medication, call the clinic.   BELOW ARE SYMPTOMS THAT SHOULD BE REPORTED IMMEDIATELY:  *FEVER GREATER THAN 100.5 F  *CHILLS WITH OR WITHOUT FEVER  NAUSEA AND VOMITING THAT IS NOT CONTROLLED WITH YOUR NAUSEA MEDICATION  *UNUSUAL SHORTNESS OF BREATH  *UNUSUAL BRUISING OR BLEEDING  TENDERNESS IN MOUTH AND THROAT WITH OR WITHOUT PRESENCE OF ULCERS  *URINARY PROBLEMS  *BOWEL PROBLEMS  UNUSUAL RASH Items with * indicate a potential emergency and should be followed up as soon as possible.  Feel free to call the clinic you have any questions or concerns. The clinic phone number is [phone].  Please show the CHEMO ALERT CARD at check-in to the Emergency Department and triage nurse.   

## 2015-06-10 NOTE — Telephone Encounter (Signed)
per pof to [place] ptappt-gave pt copy of avs-sent MAW email to sch trmt-pt to get updated copy b4 leaving

## 2015-06-10 NOTE — Progress Notes (Signed)
TSH labs will be collected next cycle per Darden Dates RN per Dr. Irene Limbo.

## 2015-06-10 NOTE — Telephone Encounter (Signed)
Per staff message and POF I have scheduled appts. Advised scheduler of appts. JMW  

## 2015-06-10 NOTE — Patient Instructions (Signed)

## 2015-06-11 ENCOUNTER — Encounter: Payer: Self-pay | Admitting: Hematology

## 2015-06-11 LAB — VITAMIN D 25 HYDROXY (VIT D DEFICIENCY, FRACTURES): Vitamin D, 25-Hydroxy: 32.7 ng/mL (ref 30.0–100.0)

## 2015-06-11 NOTE — Progress Notes (Signed)
Hematology oncology clinic follow-up.  Date of service  .06/10/2015   Patient Care Team: [name], MD as PCP - General (Internal Medicine)   Urologist:Dr. 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  Diagnosis:  Widely metastatic transitional cell carcinoma from the right renal pelvis.  Treatment -Atezolizumab IV q3weeks status post 9 cycles here for her 9th cycle today.  HISTORY OF PRESENTING ILLNESS: Please see my previous note for details of initial presentation.  INTERVAL HISTORY  [name] is here for her scheduled follow-up prior to her 10th cycle of Atezolizumab.  She notes that she is doing well but still has a persistent cough with some phelgm - predominant clear but with some dark brown flecks. No fevers/chills/chest pain or shortness of breath. Feels the cough is still bothersome and is okay with further evaluation with a CT chest (r/o hypersensitivity pneumonitis). Patient does not have andy limiting of ADL or overt SOB so even if pneumonitis might be present.  No other acute new symptoms. No diarrhea, no rectal bleeding. No other overt prohibitive toxicities from the Atezolizumab. Agreeable to continue treatments.   MEDICAL HISTORY:  Past Medical History  Diagnosis Date  . Hypertension   . Edema leg     feet and ankles  . Headache(784.0)   . Esophagus disorder     Had esophagus stretched  . Arthritis      knees 03-10-12 had Cortisone injection  . Occipital neuralgia   . Ulcerative proctitis ([place])   . DDD (degenerative disc disease), cervical   . GERD (gastroesophageal reflux disease)     Benign stricture dilated in 2011  . Occipital neuralgia     Related to cervical degenerative disc disease  . Gout     Patient notes she has possible gout but has not been on chronic medications for this  . Peptic ulcer disease     Previous history in  the remote past  . Ulcerative proctitis ([place]) 2014  . Cancer [facility]) june 2016    metastatic    SURGICAL HISTORY: Past Surgical History  Procedure Laterality Date  . Cholecystectomy    . Appendectomy    . Abdominal hysterectomy    . Dilation and curettage of uterus    . Neck surgery      20 years ago  . Esophagogastroduodenoscopy    . Colonoscopy with propofol  03/25/2012    Procedure: COLONOSCOPY WITH PROPOFOL;  Surgeon: Garlan Fair, MD;  [facility]: [facility];  Service: Endoscopy;  Laterality: N/A;  . Flexible sigmoidoscopy N/A 04/26/2014    Procedure: FLEXIBLE SIGMOIDOSCOPY - UnSedated;  Surgeon: Garlan Fair, MD;  [facility];  Service: Endoscopy;  Laterality: N/A;  . Esophageal dilation      For benign stricture in 2011  . Total abdominal hysterectomy w/ bilateral salpingoophorectomy      At age 62 due to miscarriage and retained products of conception causing significant uterine bleeding. Patient has been on estrogen replacement therapy since then.  . Flexible sigmoidoscopy N/A 01/24/2015    Procedure: FLEXIBLE SIGNMOIDOSCOPY W/ FLEET ENEMIA;  Surgeon: Garlan Fair, MD;  [facility];  Service: Endoscopy;  Laterality: N/A;    SOCIAL HISTORY: Social History   Social History  . Marital Status: Married    Spouse Name: N/A  . Number of Children: N/A  . Years of Education: N/A   Occupational History  . Not on file.  Social History Main Topics  . Smoking status: Former Smoker    Quit date: 11/01/1983  . Smokeless tobacco: Never Used  . Alcohol Use: Yes  . Drug Use: No  . Sexual Activity: Not on file   Other Topics Concern  . Not on file   Social History Narrative  Retired as Conservation officer, nature for [employer]. She is very well spoken and intelligent individual and exhibits a keen knowledge of her medical conditions.  FAMILY HISTORY: Family History  Problem Relation Age of Onset  . Lung cancer Sister   . Prostate  cancer Brother   . Uterine cancer Maternal Aunt   . Breast cancer Paternal Grandmother   . Hodgkin's lymphoma Sister     ALLERGIES:  is allergic to nitrofurantoin; indomethacin; and lisinopril.  MEDICATIONS:  Current Outpatient Prescriptions  Medication Sig Dispense Refill  . acetaminophen (TYLENOL) 650 MG CR tablet Take 1,300 mg by mouth 2 (two) times daily.    Marland Kitchen amLODipine (NORVASC) 5 MG tablet Take 5 mg by mouth daily.    Marland Kitchen azithromycin (ZITHROMAX Z-PAK) 250 MG tablet Take 2 tablets (500mg ) by mouth x 1 day.  Then take 1 tablet (250mg ) by mouth x 4 days. (Patient not taking: Reported on 05/01/2015) 6 each 0  . CANASA 1000 MG suppository Place 1,000 mg rectally daily as needed.   11  . estradiol (ESTRACE) 0.5 MG tablet Take 0.5 mg by mouth daily.  1  . HYDROcodone-acetaminophen (NORCO/VICODIN) 5-325 MG tablet Take 1-2 tablets by mouth every 4 (four) hours as needed for moderate pain or severe pain. 20 tablet 0  . mirtazapine (REMERON) 15 MG tablet Take 1 tablet (15 mg total) by mouth at bedtime. 30 tablet 2  . ondansetron (ZOFRAN) 4 MG tablet Take 1 tablet (4 mg total) by mouth every 8 (eight) hours as needed for nausea or vomiting. 30 tablet 0  . OVER THE COUNTER MEDICATION Apply 1 application topically. [place] dream over the counter cream for arthritis    . oxyCODONE (OXY IR/ROXICODONE) 5 MG immediate release tablet Take 0.5-1 tablets (2.5-5 mg total) by mouth every 4 (four) hours as needed for moderate pain, severe pain or breakthrough pain. 60 tablet 0  . PRESCRIPTION MEDICATION Chemo card    . senna-docusate (SENNA S) 8.6-50 MG per tablet Take 2 tablets by mouth at bedtime. 60 tablet 1  . triamcinolone ointment (KENALOG) 0.5 % Apply 1 application topically 2 (two) times daily. (Patient taking differently: Apply 1 application topically 2 (two) times daily as needed (for rash). ) 30 g 0   No current facility-administered medications for this visit.   REVIEW OF SYSTEMS:    10  point review of systems done and is negative except as noted above  PHYSICAL EXAMINATION: ECOG PERFORMANCE STATUS: 1  Filed Vitals:   06/10/15 0920  BP: 137/62  Pulse: 77  Temp: 98.1 F (36.7 C)  Resp: 17   Filed Weights   06/10/15 0920  Weight: 125 lb (56.7 kg)   GENERAL elderly Caucasian female, alert, no distress and comfortable SKIN: resolved skin rashes EYES: normal, conjunctiva are pink and non-injected, sclera clear OROPHARYNX:no exudate, no erythema and lips, buccal mucosa, and tongue normal  NECK: supple, thyroid normal size, non-tender, without nodularity LYMPH:  no palpable lymphadenopathy in the cervical, axillary or inguinal LUNGS: few bibasilar rales, scattered rhonci, good air entry. HEART: regular rate & rhythm and no murmurs and no lower extremity edema ABDOMEN:abdomen soft, non-tender and normal bowel sounds Musculoskeletal:no  cyanosis of digits and no clubbing  PSYCH: alert & oriented x 3 with fluent speech NEURO: no focal motor/sensory deficits  LABORATORY DATA:  . CBC Latest Ref Rng 06/10/2015 05/20/2015 04/29/2015  WBC 3.9 - 10.3 10e3/uL 4.7 5.4 5.0  Hemoglobin 11.6 - 15.9 g/dL 10.5(L) 10.8(L) 11.8  Hematocrit 34.8 - 46.6 % 33.3(L) 34.2(L) 37.2  Platelets 145 - 400 10e3/uL 322 321 328      Recent Labs  10/16/14 1611 10/17/14 0353  04/29/15 1120 05/20/15 1024 06/10/15 0933  NA 135 135  < > 141 139 140  K 4.5 3.5  < > 4.5 4.3 4.2  CL 101 101  --   --   --   --   CO2 24 24  < > 25 27 25   GLUCOSE 174* 130*  < > 88 72 101  BUN 21* 20  < > 18.0 15.4 17.2  CREATININE 1.52* 1.25*  < > 1.2* 1.0 1.0  CALCIUM 9.8 8.9  < > 9.4 9.7 9.1  GFRNONAA 29* 37*  --   --   --   --   GFRAA 34* 43*  --   --   --   --   PROT  --   --   < > 7.8 7.6 7.3  ALBUMIN  --   --   < > 3.7 3.4* 3.2*  AST  --   --   < > 19 16 15   ALT  --   --   < > 9 9 <9  ALKPHOS  --   --   < > 51 53 45  BILITOT  --   --   < > 0.40 0.42 0.39  < > = values in this interval not  displayed.    RADIOGRAPHIC STUDIES:  PET/CT 04/27/2015: IMPRESSION: 1. No evidence of metastatic disease. Hepatic and osseous lesions do not show abnormal hypermetabolism. 2. Scattered patchy peribronchovascular nodularity, ground-glass and slight consolidation in the lungs, new from prior exam and mildly hypermetabolic. Infectious bronchiolitis/ bronchopneumonia is favored. 3. Three-vessel coronary artery calcification. 4. Chronic appearing right hydronephrosis.   Electronically Signed  By: Lorin Picket M.D.  On: 04/27/2015 10:01   ASSESSMENT & PLAN:   82 year old Caucasian female with  #1 Right Renal pelvis metastatic transitional cell carcinoma.  PET scan reveals metastases to the lungs, liver, multiple nodal stations and bones. She is status post 9 cycles of Atezolizumab. PET scan done after 8 cycles of treatment for restaging disease showed significant response to treatment with no evidence of active disease #2 Urethral pains and bladder spasms/ discomfort. Urine cultures negative. Symptoms have now completely resolved. #3 skin rash likely related to her Atezolizumab. Was grade 1 and is now much improved and nearly resolved with topical triamcinolone. #4  S/p Mucous and slight blood in stools. No diarrhea C. difficile negative. Symptoms resolved with mesalamine suppositories that she had been started on by GI .currently off these with stable symptoms. #5 COugh and clear mucous with dark fleck. No significant shortness of breath - ?resolving pneumonia (recently treated with antibiotics) vs pneumonitis/ILD due to Atezolizumab vs progression of mets. Plan  -Labs stable and patient is appropriate to receive 10th cycle of Atezolizumab. Today. -CT chest without IV contrast in 1-3 days. -Continue current pain management.needing minimal pain medications at this time. -Continue Xgeva every 6 weeks -Continue triamcinolone ointment for her skin rash as needed. -Continue  mesalamine suppositories as per Dr. Hassell Done for ulcerative proctitis- using on an as needed basis and  hasnt needed it recently. -Will plan to treat the patient with 1200mg  Q3weeks until treatment progression or prohibitive toxicities. -Patient has been counseled to seek immediate attention if she has increasing abdominal pain, GI bleeding or  #6 Normocytic normochromic anemia likely due to anemia of chronic disease from her metastatic malignancy , medication and chronic kidney disease. hgb stable. Previously had some GIB which has resolved. -We will monitor and treat with when necessary blood transfusions if symptomatic.  #7 history of hypertension patient's blood pressure has been lower given associated weight loss. Now that the patient has gained back some of her weight and is eating well with some increase in BP -restarted in Amlodipine 5mg  po daily  #8 CAP -s/p treatment with Azithromycin x 5 days and Cefpodoxime 200mg  po BID x 14 days. Improved cough. No fevers/chills. Some post nasal drip and minimal remaining cough. R/o pneumonitis r/o worsening mets Plan -CT chest -if evidence of pneumonitis - might need to consider steroids and if significant treatment delay/holding.  RTC with Dr Irene Limbo in 3 weeks prior to next cycle of treatment with rpt labs  All questions were answered. The patient knows to call the clinic with any problems, questions or concerns. I spent 25 minutes counseling the patient face to face. The total time spent in the appointment was 25 minutes and more than 50% was on counseling.   Sullivan Lone MD [place] Hematology/Oncology Physician [facility]  (Office):       [phone] (Work cell):  [phone] (Fax):           [phone]

## 2015-06-16 ENCOUNTER — Ambulatory Visit (HOSPITAL_COMMUNITY)
Admission: RE | Admit: 2015-06-16 | Discharge: 2015-06-16 | Disposition: A | Discharge status: Home / Self Care | Payer: Medicare Other | Source: Ambulatory Visit | Attending: Hematology | Admitting: Hematology

## 2015-06-16 ENCOUNTER — Encounter (HOSPITAL_COMMUNITY): Payer: Self-pay

## 2015-06-16 DIAGNOSIS — C651 Malignant neoplasm of right renal pelvis: Secondary | ICD-10-CM | POA: Insufficient documentation

## 2015-06-16 DIAGNOSIS — J479 Bronchiectasis, uncomplicated: Secondary | ICD-10-CM | POA: Diagnosis not present

## 2015-06-16 DIAGNOSIS — I2510 Atherosclerotic heart disease of native coronary artery without angina pectoris: Secondary | ICD-10-CM | POA: Diagnosis not present

## 2015-06-16 DIAGNOSIS — R05 Cough: Secondary | ICD-10-CM | POA: Insufficient documentation

## 2015-06-16 DIAGNOSIS — R059 Cough, unspecified: Secondary | ICD-10-CM

## 2015-06-16 DIAGNOSIS — R918 Other nonspecific abnormal finding of lung field: Secondary | ICD-10-CM | POA: Insufficient documentation

## 2015-07-01 ENCOUNTER — Other Ambulatory Visit (HOSPITAL_BASED_OUTPATIENT_CLINIC_OR_DEPARTMENT_OTHER): Payer: Medicare Other

## 2015-07-01 ENCOUNTER — Ambulatory Visit: Payer: Medicare Other

## 2015-07-01 ENCOUNTER — Ambulatory Visit (HOSPITAL_BASED_OUTPATIENT_CLINIC_OR_DEPARTMENT_OTHER): Payer: Medicare Other | Admitting: Hematology

## 2015-07-01 ENCOUNTER — Telehealth: Payer: Self-pay | Admitting: Hematology

## 2015-07-01 ENCOUNTER — Encounter: Payer: Self-pay | Admitting: Hematology

## 2015-07-01 VITALS — BP 150/72 | HR 77 | Temp 98.2°F | Resp 18 | Ht 61.0 in | Wt 125.5 lb

## 2015-07-01 DIAGNOSIS — D649 Anemia, unspecified: Secondary | ICD-10-CM

## 2015-07-01 DIAGNOSIS — C787 Secondary malignant neoplasm of liver and intrahepatic bile duct: Secondary | ICD-10-CM

## 2015-07-01 DIAGNOSIS — C7951 Secondary malignant neoplasm of bone: Secondary | ICD-10-CM

## 2015-07-01 DIAGNOSIS — C778 Secondary and unspecified malignant neoplasm of lymph nodes of multiple regions: Secondary | ICD-10-CM

## 2015-07-01 DIAGNOSIS — Z79899 Other long term (current) drug therapy: Secondary | ICD-10-CM

## 2015-07-01 DIAGNOSIS — C651 Malignant neoplasm of right renal pelvis: Secondary | ICD-10-CM | POA: Diagnosis not present

## 2015-07-01 DIAGNOSIS — Z95828 Presence of other vascular implants and grafts: Secondary | ICD-10-CM

## 2015-07-01 DIAGNOSIS — J219 Acute bronchiolitis, unspecified: Secondary | ICD-10-CM

## 2015-07-01 DIAGNOSIS — J679 Hypersensitivity pneumonitis due to unspecified organic dust: Secondary | ICD-10-CM

## 2015-07-01 DIAGNOSIS — C7800 Secondary malignant neoplasm of unspecified lung: Secondary | ICD-10-CM | POA: Diagnosis not present

## 2015-07-01 DIAGNOSIS — Z452 Encounter for adjustment and management of vascular access device: Secondary | ICD-10-CM | POA: Diagnosis not present

## 2015-07-01 DIAGNOSIS — I10 Essential (primary) hypertension: Secondary | ICD-10-CM

## 2015-07-01 DIAGNOSIS — R05 Cough: Secondary | ICD-10-CM

## 2015-07-01 DIAGNOSIS — C659 Malignant neoplasm of unspecified renal pelvis: Secondary | ICD-10-CM

## 2015-07-01 LAB — CBC & DIFF AND RETIC
BASO%: 0.5 % (ref 0.0–2.0)
Basophils Absolute: 0 10*3/uL (ref 0.0–0.1)
EOS ABS: 0.1 10*3/uL (ref 0.0–0.5)
EOS%: 1.3 % (ref 0.0–7.0)
HCT: 33.8 % — ABNORMAL LOW (ref 34.8–46.6)
HGB: 10.8 g/dL — ABNORMAL LOW (ref 11.6–15.9)
IMMATURE RETIC FRACT: 9.5 % (ref 1.60–10.00)
LYMPH#: 1.6 10*3/uL (ref 0.9–3.3)
LYMPH%: 42.1 % (ref 14.0–49.7)
MCH: 30.3 pg (ref 25.1–34.0)
MCHC: 32 g/dL (ref 31.5–36.0)
MCV: 94.7 fL (ref 79.5–101.0)
MONO#: 0.6 10*3/uL (ref 0.1–0.9)
MONO%: 16.5 % — ABNORMAL HIGH (ref 0.0–14.0)
NEUT#: 1.5 10*3/uL (ref 1.5–6.5)
NEUT%: 39.6 % (ref 38.4–76.8)
PLATELETS: 286 10*3/uL (ref 145–400)
RBC: 3.57 10*6/uL — AB (ref 3.70–5.45)
RDW: 15.7 % — AB (ref 11.2–14.5)
Retic %: 1.69 % (ref 0.70–2.10)
Retic Ct Abs: 60.33 10*3/uL (ref 33.70–90.70)
WBC: 3.9 10*3/uL (ref 3.9–10.3)

## 2015-07-01 LAB — COMPREHENSIVE METABOLIC PANEL
ALBUMIN: 3.3 g/dL — AB (ref 3.5–5.0)
ALK PHOS: 40 U/L (ref 40–150)
ALT: <9 U/L (ref 0–55)
ANION GAP: 7 meq/L (ref 3–11)
AST: 16 U/L (ref 5–34)
BILIRUBIN TOTAL: 0.45 mg/dL (ref 0.20–1.20)
BUN: 17.7 mg/dL (ref 7.0–26.0)
CO2: 25 mEq/L (ref 22–29)
Calcium: 9.2 mg/dL (ref 8.4–10.4)
Chloride: 107 mEq/L (ref 98–109)
Creatinine: 1 mg/dL (ref 0.6–1.1)
EGFR: 51 mL/min/{1.73_m2} — AB (ref >90)
Glucose: 108 mg/dl (ref 70–140)
Potassium: 4.1 mEq/L (ref 3.5–5.1)
Sodium: 139 mEq/L (ref 136–145)
TOTAL PROTEIN: 7.3 g/dL (ref 6.4–8.3)

## 2015-07-01 LAB — TSH: TSH: 1.664 m[IU]/L (ref 0.308–3.960)

## 2015-07-01 MED ORDER — SODIUM CHLORIDE 0.9% FLUSH
10.0000 mL | INTRAVENOUS | Status: DC | PRN
  Administered 2015-07-01: 10 mL via INTRAVENOUS
  Filled 2015-07-01: qty 10

## 2015-07-01 MED ORDER — HEPARIN SOD (PORK) LOCK FLUSH 100 UNIT/ML IV SOLN
500.0000 [IU] | Freq: Once | INTRAVENOUS | Status: AC
  Administered 2015-07-01: 500 [IU] via INTRAVENOUS
  Filled 2015-07-01: qty 5

## 2015-07-01 MED ORDER — MOXIFLOXACIN HCL 400 MG PO TABS: 400.0000 mg | ORAL_TABLET | Freq: Every day | ORAL | Status: DC

## 2015-07-01 NOTE — Telephone Encounter (Signed)
Gave and printed appt sched and avs for pt for April °

## 2015-07-01 NOTE — Patient Instructions (Signed)

## 2015-07-01 NOTE — Progress Notes (Signed)
Hematology oncology clinic follow-up.  Date of service. Marland Kitchen03/31/2017  Patient Care Team: [name], MD as PCP - General (Internal Medicine)   Urologist:Dr. 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  Diagnosis:  Widely metastatic transitional cell carcinoma from the right renal pelvis.  Treatment -Atezolizumab IV q3weeks status post 10 cycles here for her 11th cycle today.  HISTORY OF PRESENTING ILLNESS: Please see my previous note for details of initial presentation.  INTERVAL HISTORY  Tiffany [name] is here for her scheduled follow-up prior to her 11th cycle of Atezolizumab.  She notes that she feels well and has had no fevers or chills. Notes no significant shortness of breath but has persistent bothersome cough. He is still bringing up some yellow secretions with no significant hemoptysis. CT of the chest showed significant bronchiectasis and some bilateral ground glass opacities and some peribronchovascular groundglass opacities consolidation.  patient notes that she feels well overall he has been previously treated with vantin and his azithromycin for suspected pneumonia with some improvement. We decided to hold her Atezolizumab treatment this week.  patient was given a referral to [facility] for further evaluation of her lung condition and for sputum studies.  MEDICAL HISTORY:  Past Medical History  Diagnosis Date  . Hypertension   . Edema leg     feet and ankles  . Headache(784.0)   . Esophagus disorder     Had esophagus stretched  . Arthritis      knees 03-10-12 had Cortisone injection  . Occipital neuralgia   . Ulcerative proctitis ([place])   . DDD (degenerative disc disease), cervical   . GERD (gastroesophageal reflux disease)     Benign stricture dilated in 2011  . Occipital neuralgia     Related to cervical degenerative disc disease  . Gout       Patient notes she has possible gout but has not been on chronic medications for this  . Peptic ulcer disease     Previous history in the remote past  . Ulcerative proctitis ([place]) 2014  . Cancer [facility]) june 2016    metastatic    SURGICAL HISTORY: Past Surgical History  Procedure Laterality Date  . Cholecystectomy    . Appendectomy    . Abdominal hysterectomy    . Dilation and curettage of uterus    . Neck surgery      20 years ago  . Esophagogastroduodenoscopy    . Colonoscopy with propofol  03/25/2012    Procedure: COLONOSCOPY WITH PROPOFOL;  Surgeon: Garlan Fair, MD;  Location: [facility];  Service: Endoscopy;  Laterality: N/A;  . Flexible sigmoidoscopy N/A 04/26/2014    Procedure: FLEXIBLE SIGMOIDOSCOPY - UnSedated;  Surgeon: Garlan Fair, MD;  Location: [facility];  Service: Endoscopy;  Laterality: N/A;  . Esophageal dilation      For benign stricture in 2011  . Total abdominal hysterectomy w/ bilateral salpingoophorectomy      At age 77 due to miscarriage and retained products of conception causing significant uterine bleeding. Patient has been on estrogen replacement therapy since then.  . Flexible sigmoidoscopy N/A 01/24/2015    Procedure: FLEXIBLE SIGNMOIDOSCOPY W/ FLEET ENEMIA;  Surgeon: Garlan Fair, MD;  Location: [facility];  Service: Endoscopy;  Laterality: N/A;    SOCIAL HISTORY: Social History   Social History  . Marital Status: Married    Spouse Name: N/A  . Number of Children: N/A  .  Years of Education: N/A   Occupational History  . Not on file.   Social History Main Topics  . Smoking status: Former Smoker    Quit date: 11/01/1983  . Smokeless tobacco: Never Used  . Alcohol Use: Yes  . Drug Use: No  . Sexual Activity: Not on file   Other Topics Concern  . Not on file   Social History Narrative  Retired as Conservation officer, nature for [employer]. She is very well spoken and intelligent individual and exhibits a keen  knowledge of her medical conditions.  FAMILY HISTORY: Family History  Problem Relation Age of Onset  . Lung cancer Sister   . Prostate cancer Brother   . Uterine cancer Maternal Aunt   . Breast cancer Paternal Grandmother   . Hodgkin's lymphoma Sister     ALLERGIES:  is allergic to nitrofurantoin; indomethacin; and lisinopril.  MEDICATIONS:  Current Outpatient Prescriptions  Medication Sig Dispense Refill  . acetaminophen (TYLENOL) 650 MG CR tablet Take 1,300 mg by mouth 2 (two) times daily.    Marland Kitchen amLODipine (NORVASC) 5 MG tablet Take 5 mg by mouth daily.    Marland Kitchen CANASA 1000 MG suppository Place 1,000 mg rectally daily as needed.   11  . estradiol (ESTRACE) 0.5 MG tablet Take 0.5 mg by mouth daily.  1  . HYDROcodone-acetaminophen (NORCO/VICODIN) 5-325 MG tablet Take 1-2 tablets by mouth every 4 (four) hours as needed for moderate pain or severe pain. 20 tablet 0  . mirtazapine (REMERON) 15 MG tablet Take 1 tablet (15 mg total) by mouth at bedtime. 30 tablet 2  . ondansetron (ZOFRAN) 4 MG tablet Take 1 tablet (4 mg total) by mouth every 8 (eight) hours as needed for nausea or vomiting. 30 tablet 0  . OVER THE COUNTER MEDICATION Apply 1 application topically. [place] dream over the counter cream for arthritis    . oxyCODONE (OXY IR/ROXICODONE) 5 MG immediate release tablet Take 0.5-1 tablets (2.5-5 mg total) by mouth every 4 (four) hours as needed for moderate pain, severe pain or breakthrough pain. 60 tablet 0  . PRESCRIPTION MEDICATION Chemo card    . senna-docusate (SENNA S) 8.6-50 MG per tablet Take 2 tablets by mouth at bedtime. 60 tablet 1  . triamcinolone ointment (KENALOG) 0.5 % Apply 1 application topically 2 (two) times daily. (Patient taking differently: Apply 1 application topically 2 (two) times daily as needed (for rash). ) 30 g 0  . moxifloxacin (AVELOX) 400 MG tablet Take 1 tablet (400 mg total) by mouth daily at 8 pm. 10 tablet 0   Current Facility-Administered  Medications  Medication Dose Route Frequency Provider Last Rate Last Dose  . sodium chloride flush (NS) 0.9 % injection 10 mL  10 mL Intravenous PRN [name], MD   10 mL at 07/01/15 1057   REVIEW OF SYSTEMS:    10 point review of systems done and is negative except as noted above  PHYSICAL EXAMINATION: ECOG PERFORMANCE STATUS: 1  Filed Vitals:   07/01/15 0923  BP: 150/72  Pulse: 77  Temp: 98.2 F (36.8 C)  Resp: 18   Filed Weights   07/01/15 0923  Weight: 125 lb 8 oz (56.926 kg)   GENERAL elderly Caucasian female, alert, no distress and comfortable SKIN: resolved skin rashes EYES: normal, conjunctiva are pink and non-injected, sclera clear OROPHARYNX:no exudate, no erythema and lips, buccal mucosa, and tongue normal  NECK: supple, thyroid normal size, non-tender, without nodularity LYMPH:  no palpable lymphadenopathy in  the cervical, axillary or inguinal LUNGS: few bibasilar rales, scattered rhonci, good air entry. HEART: regular rate & rhythm and no murmurs and no lower extremity edema ABDOMEN:abdomen soft, non-tender and normal bowel sounds Musculoskeletal:no cyanosis of digits and no clubbing  PSYCH: alert & oriented x 3 with fluent speech NEURO: no focal motor/sensory deficits  LABORATORY DATA:  CBC Latest Ref Rng 07/01/2015 06/10/2015 05/20/2015  WBC 3.9 - 10.3 10e3/uL 3.9 4.7 5.4  Hemoglobin 11.6 - 15.9 g/dL 10.8(L) 10.5(L) 10.8(L)  Hematocrit 34.8 - 46.6 % 33.8(L) 33.3(L) 34.2(L)  Platelets 145 - 400 10e3/uL 286 322 321     Recent Labs  10/16/14 1611 10/17/14 0353  05/20/15 1024 06/10/15 0933 07/01/15 0823  NA 135 135  < > 139 140 139  K 4.5 3.5  < > 4.3 4.2 4.1  CL 101 101  --   --   --   --   CO2 24 24  < > 27 25 25   GLUCOSE 174* 130*  < > 72 101 108  BUN 21* 20  < > 15.4 17.2 17.7  CREATININE 1.52* 1.25*  < > 1.0 1.0 1.0  CALCIUM 9.8 8.9  < > 9.7 9.1 9.2  GFRNONAA 29* 37*  --   --   --   --   GFRAA 34* 43*  --   --   --   --   PROT  --    --   < > 7.6 7.3 7.3  ALBUMIN  --   --   < > 3.4* 3.2* 3.3*  AST  --   --   < > 16 15 16   ALT  --   --   < > 9 <9 <9  ALKPHOS  --   --   < > 53 45 40  BILITOT  --   --   < > 0.42 0.39 0.45  < > = values in this interval not displayed.    RADIOGRAPHIC STUDIES:  PET/CT 04/27/2015: IMPRESSION: 1. No evidence of metastatic disease. Hepatic and osseous lesions do not show abnormal hypermetabolism. 2. Scattered patchy peribronchovascular nodularity, ground-glass and slight consolidation in the lungs, new from prior exam and mildly hypermetabolic. Infectious bronchiolitis/ bronchopneumonia is favored. 3. Three-vessel coronary artery calcification. 4. Chronic appearing right hydronephrosis.   Electronically Signed  By: Lorin Picket M.D.  On: 04/27/2015 10:01   ASSESSMENT & PLAN:   82 year old Caucasian female with  #1 Right Renal pelvis metastatic transitional cell carcinoma.  PET scan reveals metastases to the lungs, liver, multiple nodal stations and bones. She is status post 10 cycles of Atezolizumab. PET scan done after 8 cycles of treatment for restaging disease showed significant response to treatment with no evidence of active disease. Patient has no clinical evidence of disease progression at this time. #2 Urethral pains and bladder spasms/ discomfort. Urine cultures negative. Symptoms have now completely resolved. #3 skin rash likely related to her Atezolizumab. Was grade 1 and is now much improved and nearly resolved with topical triamcinolone. #4  S/p Mucous and slight blood in stools. No diarrhea C. difficile negative. Symptoms resolved with mesalamine suppositories that she had been started on by GI .currently off these with stable symptoms. #5 COugh and clear mucous with dark flecks. No significant shortness of breath. Patient had a repeat CT chest which shows increasing bronchiectasis and GGO - ? Atypical infection / pneumonitis/ILD due to metastatic malignancy  vs Atezolizumab  Plan  The patient does not appear toxic, does not have  any shortness of breath that has progressed. We will hold off on her next cycle of Teqcentric that was due today until she has a pulmonary evaluation . -Urgent Pulmonary referral for evaluation of her lung condition. -AFB for atypical mycobacteria , PJP, bacterial cultures . -Moxifloxacin x 10 days. Preferably after sputum studies. -Given the fact that she will have to wait for pulmonary evaluation would treat with moxifloxacin for 10 days for bacterial infection causing exacerbation of her bronchiectasis. --Continue current pain management.needing minimal pain medications at this time. -Continue Xgeva every 6 weeks -Continue triamcinolone ointment for her skin rash as needed. -Continue mesalamine suppositories as per Dr. Hassell Done for ulcerative proctitis- using on an as needed basis and hasnt needed it recently.  #6 Normocytic normochromic anemia likely due to anemia of chronic disease from her metastatic malignancy , medication and chronic kidney disease. hgb stable. Previously had some GIB which has resolved. -We will monitor and treat with when necessary blood transfusions if symptomatic.  #7 history of hypertension patient's blood pressure has been lower given associated weight loss. Now that the patient has gained back some of her weight and is eating well . Weight has been stable.  Wt Readings from Last 3 Encounters:  07/01/15 125 lb 8 oz (56.926 kg)  06/10/15 125 lb (56.7 kg)  05/20/15 125 lb 8 oz (56.926 kg)    -on Amlodipine 5mg  po daily   RTC with Dr Irene Limbo in 3 weeks prior to next cycle of treatment with rpt labs. Pulmonary referral urgently. Earlier followup if any new concerns.  All questions were answered. The patient knows to call the clinic with any problems, questions or concerns. I spent 25 minutes counseling the patient face to face. The total time spent in the appointment was 25 minutes and more than  50% was on counseling.   Sullivan Lone MD [place] [facility]  (Office):       [phone] (Work cell):  [phone] (Fax):           [phone]

## 2015-07-06 ENCOUNTER — Ambulatory Visit (INDEPENDENT_AMBULATORY_CARE_PROVIDER_SITE_OTHER): Payer: Medicare Other | Admitting: Internal Medicine

## 2015-07-06 ENCOUNTER — Encounter: Payer: Self-pay | Admitting: Internal Medicine

## 2015-07-06 VITALS — BP 154/74 | HR 92 | Ht 60.0 in | Wt 126.8 lb

## 2015-07-06 DIAGNOSIS — R05 Cough: Secondary | ICD-10-CM | POA: Diagnosis not present

## 2015-07-06 DIAGNOSIS — R059 Cough, unspecified: Secondary | ICD-10-CM

## 2015-07-06 DIAGNOSIS — J471 Bronchiectasis with (acute) exacerbation: Secondary | ICD-10-CM

## 2015-07-06 MED ORDER — LEVOFLOXACIN 500 MG PO TABS: 500.0000 mg | ORAL_TABLET | Freq: Every day | ORAL | Status: DC

## 2015-07-06 MED ORDER — FLUTTER DEVI: 1.0000 | Freq: Every day | Status: AC

## 2015-07-06 MED ORDER — PREDNISONE 10 MG PO TABS: ORAL_TABLET | ORAL | Status: DC

## 2015-07-06 NOTE — Progress Notes (Signed)
Subjective:    Patient ID: Tiffany [name], female      DOB: [birth year]     MRN: [id number]  HPI  72 yowf quit smoking in 1985 no prev resp dx referred 07/06/2015 by Dr Irene Limbo to [facility] for cough with abn ct c/w bronchiectasis/MAI on 06/16/15    07/06/2015 1st [facility] office visit/ [place]   Chief Complaint  Patient presents with  . pulmonary consult    pt ref by dr Irene Limbo for cough. pt c/o prod cough tan in color X6-8wk  insidious onset persistent daily cough x 6-8 weeks worse at hs and early in am and mucus brown in am and lighter as day goes on Already rx with zpak, vantin - was precribed avelox but never started it.  Not limited by breathing from desired activities but quite sedentary    No obvious other patterns in day to day or daytime variabilty or assoc chronic cough or cp or chest tightness, subjective wheeze overt sinus or hb symptoms. No unusual exp hx or h/o childhood pna/ asthma or knowledge of premature birth.  Sleeping ok without nocturnal  or early am exacerbation  of respiratory  c/o's or need for noct saba. Also denies any obvious fluctuation of symptoms with weather or environmental changes or other aggravating or alleviating factors except as outlined above   Current Medications, Allergies, Complete Past Medical History, Past Surgical History, Family History, and Social History were reviewed in [employer] record.           Review of Systems  Constitutional: Negative for fever and unexpected weight change.  HENT: Negative for congestion, dental problem, ear pain, nosebleeds, postnasal drip, rhinorrhea, sinus pressure, sneezing, sore throat and trouble swallowing.   Eyes: Negative for redness and itching.  Respiratory: Positive for cough. Negative for chest tightness, shortness of breath and wheezing.   Cardiovascular: Negative for palpitations and leg swelling.  Gastrointestinal: Negative for nausea and vomiting.  Genitourinary: Negative  for dysuria.  Musculoskeletal: Positive for joint swelling.  Skin: Negative for rash.  Neurological: Negative for headaches.  Hematological: Does not bruise/bleed easily.  Psychiatric/Behavioral: Negative for dysphoric mood. The patient is not nervous/anxious.        Objective:   Physical Exam   Pleasant wf nad/ junky rattling cough   Wt Readings from Last 3 Encounters:  07/06/15 126 lb 12.8 oz (57.516 kg)  07/01/15 125 lb 8 oz (56.926 kg)  06/10/15 125 lb (56.7 kg)    Vital signs reviewed  HEENT: nl dentition, turbinates, and oropharynx. Nl external ear canals without cough reflex   NECK :  without JVD/Nodes/TM/ nl carotid upstrokes bilaterally   LUNGS: no acc muscle use,  Nl contour chest with insp and exp rhonchi and crackles in both bases    CV:  RRR  no s3 or murmur or increase in P2, no edema   ABD:  soft and nontender with nl inspiratory excursion in the supine position. No bruits or organomegaly, bowel sounds nl  MS:  Nl gait/ ext warm without deformities, calf tenderness, cyanosis or clubbing No obvious joint restrictions   SKIN: warm and dry without lesions    NEURO:  alert, approp, nl sensorium with  no motor deficits       I personally reviewed images and agree with radiology impression as follows:  CT Chest   06/16/15   1. Significant interval worsening of extensive bronchiectasis and tree-in-bud opacities in the mid to lower lungs bilaterally and worsened  basilar lower lobe peribronchovascular ground-glass opacity and consolidation. These findings are most consistent with worsening infectious bronchiolitis and bronchopneumonia, favor atypical mycobacterial infection (MAI) given the significant bronchiectasis. Recurrent aspiration pneumonitis could also present with this pattern. 2. Stable areas of treated tumor in the visualized liver and spine. 3. Coronary atherosclerosis.     Assessment & Plan:

## 2015-07-06 NOTE — Patient Instructions (Addendum)
Levaquin 500 mg daily x 10 days- stop it if develop aches in your tendons  Prednisone 10 mg take  4 each am x 2 days,   2 each am x 2 days,  1 each am x 2 days and stop   mucinex 1200 mg every 12 hours as needed for cough and congestion and use the flutter valve as much as possible  Please schedule a follow up office visit in 2 weeks, sooner if needed with cxr on return

## 2015-07-07 NOTE — Assessment & Plan Note (Signed)
This is an extremely common benign condition in the elderly and does not warrant aggressive eval/ rx at this point unless there is a clinical correlation suggesting unaddressed pulmonary infection (purulent sputum, night sweats, unintended wt loss, doe) or evolution of  obvious changes on plain cxr (as opposed to serial CT, which is way over sensitive to make clinical decisions re intervention and treatment in the elderly, who tend to tolerate both dx and treatment poorly) .   In her case since coughing up brown mucus may also have opportunistic gnr involvement  I had an extended discussion with the patient reviewing all relevant studies completed to date and  lasting 35  minutes of a 60 minute visit initial ov  1) pathophysiology of bronchiectasis reviewed using an escalator analogy  2) possibility of future FOB discussed but for now not needed  3) possible side effects for levaquin x 10 d rx also reviewed   4)  Each maintenance medication was reviewed in detail including most importantly the difference between maintenance and prns and under what circumstances the prns are to be triggered using an action plan format that is not reflected in the computer generated alphabetically organized AVS.    Please see instructions for details which were reviewed in writing and the patient given a copy highlighting the part that I personally wrote and discussed at today's ov.

## 2015-07-13 ENCOUNTER — Other Ambulatory Visit: Payer: Self-pay | Admitting: Hematology

## 2015-07-14 ENCOUNTER — Other Ambulatory Visit: Payer: Self-pay | Admitting: *Deleted

## 2015-07-14 DIAGNOSIS — C659 Malignant neoplasm of unspecified renal pelvis: Secondary | ICD-10-CM

## 2015-07-14 MED ORDER — MIRTAZAPINE 15 MG PO TABS: 15.0000 mg | ORAL_TABLET | Freq: Every day | ORAL | Status: DC

## 2015-07-15 ENCOUNTER — Encounter: Payer: Self-pay | Admitting: Hematology

## 2015-07-15 NOTE — Progress Notes (Signed)
Sent prior auth for mirtazapine bcbs

## 2015-07-19 ENCOUNTER — Encounter: Payer: Self-pay | Admitting: Internal Medicine

## 2015-07-19 ENCOUNTER — Ambulatory Visit (INDEPENDENT_AMBULATORY_CARE_PROVIDER_SITE_OTHER): Payer: Medicare Other | Admitting: Internal Medicine

## 2015-07-19 ENCOUNTER — Ambulatory Visit (INDEPENDENT_AMBULATORY_CARE_PROVIDER_SITE_OTHER)
Admission: RE | Admit: 2015-07-19 | Discharge: 2015-07-19 | Disposition: A | Discharge status: Home / Self Care | Payer: Medicare Other | Source: Ambulatory Visit | Attending: Internal Medicine | Admitting: Internal Medicine

## 2015-07-19 VITALS — BP 120/70 | HR 76 | Ht 60.0 in | Wt 127.0 lb

## 2015-07-19 DIAGNOSIS — R05 Cough: Secondary | ICD-10-CM | POA: Diagnosis not present

## 2015-07-19 DIAGNOSIS — J479 Bronchiectasis, uncomplicated: Secondary | ICD-10-CM

## 2015-07-19 NOTE — Patient Instructions (Signed)
mucinex 1200 mg up to every 12 hours as needed for cough and congestion and use the flutter valve as much as possible  Please schedule a follow up visit in 3 months but call sooner if needed

## 2015-07-19 NOTE — Assessment & Plan Note (Signed)
CT  06/16/15 c/w bronchiectasis/ MAI >Levaquin 500 mg one daily x 10 days started  07/06/2015 Plus flutter > much better 07/19/2015   I had an extended discussion with the patient reviewing all relevant studies completed to date and  lasting 15 to 20 minutes of a 25 minute visit   Advised we can improve but not eliminate this problem with abx  Discussed in detail all the  indications, usual  risks and alternatives  relative to the benefits (or aggressive abx vs more conservative prn abx eg levaquin 500 x 7 days when cough worse or more purulent sputum or cp/ fever hemoptyis)  with patient who agrees to proceed with the latter more conservative f/u as outlined    Each maintenance medication was reviewed in detail including most importantly the difference between maintenance and prns and under what circumstances the prns are to be triggered using an action plan format that is not reflected in the computer generated alphabetically organized AVS.    Please see instructions for details which were reviewed in writing and the patient given a copy highlighting the part that I personally wrote and discussed at today's ov.

## 2015-07-19 NOTE — Progress Notes (Signed)
Subjective:    Patient ID: Tiffany [name], female      DOB: 1925/01/16     MRN: [id number]  HPI  43 yowf quit smoking in 1985 no prev resp dx referred 07/06/2015 by Dr Irene Limbo to [facility] for cough with abn ct c/w bronchiectasis/MAI on 06/16/15    07/06/2015 1st [facility] office visit/ Isiaha [place]   Chief Complaint  Patient presents with  . pulmonary consult    pt ref by dr Irene Limbo for cough. pt c/o prod cough tan in color X6-8wk  insidious onset persistent daily cough x 6-8 weeks worse at hs and early in am and mucus brown in am and lighter as day goes on Already rx with zpak, vantin - was precribed avelox but never started it. rec Levaquin 500 mg daily x 10 days- stop it if develop aches in your tendons Prednisone 10 mg take  4 each am x 2 days,   2 each am x 2 days,  1 each am x 2 days and stop  mucinex 1200 mg every 12 hours as needed for cough and congestion and use the flutter valve as much as possible     07/19/2015  f/u ov/Lindalee Huizinga re: bronchiectasis / probable MAI  Chief Complaint  Patient presents with  . Follow-up    CXR done today. Her cough has improved some. Still coughing up some light brown sputum.   Not limited by breathing from desired activities   Cough is mostly daytime   No obvious day to day or daytime variability or assoc sob  or cp or chest tightness, subjective wheeze or overt sinus or hb symptoms. No unusual exp hx or h/o childhood pna/ asthma or knowledge of premature birth.  Sleeping ok without nocturnal  or early am exacerbation  of respiratory  c/o's or need for noct saba. Also denies any obvious fluctuation of symptoms with weather or environmental changes or other aggravating or alleviating factors except as outlined above   Current Medications, Allergies, Complete Past Medical History, Past Surgical History, Family History, and Social History were reviewed in [employer] record.  ROS  The following are not active complaints unless  bolded sore throat, dysphagia, dental problems, itching, sneezing,  nasal congestion or excess/ purulent secretions, ear ache,   fever, chills, sweats, unintended wt loss, classically pleuritic or exertional cp, hemoptysis,  orthopnea pnd or leg swelling, presyncope, palpitations, abdominal pain, anorexia, nausea, vomiting, diarrhea  or change in bowel or bladder habits, change in stools or urine, dysuria,hematuria,  rash, arthralgias, visual complaints, headache, numbness, weakness or ataxia or problems with walking or coordination,  change in mood/affect or memory.        .                Objective:   Physical Exam   Pleasant wf nad/minimal rattling   ,07/19/2015       127   07/06/15 126 lb 12.8 oz (57.516 kg)  07/01/15 125 lb 8 oz (56.926 kg)  06/10/15 125 lb (56.7 kg)    Vital signs reviewed  HEENT: nl dentition, turbinates, and oropharynx. Nl external ear canals without cough reflex   NECK :  without JVD/Nodes/TM/ nl carotid upstrokes bilaterally   LUNGS: no acc muscle use,  Nl contour chest with very minimal insp and exp rhonchi and crackles in both bases    CV:  RRR  no s3 or murmur or increase in P2, no edema   ABD:  soft and nontender  with nl inspiratory excursion in the supine position. No bruits or organomegaly, bowel sounds nl  MS:  Nl gait/ ext warm without deformities, calf tenderness, cyanosis or clubbing No obvious joint restrictions   SKIN: warm and dry without lesions    NEURO:  alert, approp, nl sensorium with  no motor deficits    CXR PA and Lateral:   07/19/2015 :    I personally reviewed images and agree with radiology impression as follows:    1. Chronic changes at the lung bases. No definite active process. 2. Right-sided Port-A-Cath tip overlies the lower SVC.     Assessment & Plan:

## 2015-07-20 NOTE — Progress Notes (Signed)
Quick Note:  Spoke with pt and notified of results per Dr. Wert. Pt verbalized understanding and denied any questions.  ______ 

## 2015-07-21 ENCOUNTER — Other Ambulatory Visit: Payer: Self-pay

## 2015-07-21 DIAGNOSIS — Z95828 Presence of other vascular implants and grafts: Secondary | ICD-10-CM

## 2015-07-22 ENCOUNTER — Telehealth: Payer: Self-pay | Admitting: Hematology

## 2015-07-22 ENCOUNTER — Other Ambulatory Visit: Payer: Self-pay | Admitting: *Deleted

## 2015-07-22 ENCOUNTER — Encounter: Payer: Self-pay | Admitting: Hematology

## 2015-07-22 ENCOUNTER — Other Ambulatory Visit (HOSPITAL_BASED_OUTPATIENT_CLINIC_OR_DEPARTMENT_OTHER): Payer: Medicare Other

## 2015-07-22 ENCOUNTER — Ambulatory Visit: Payer: Medicare Other

## 2015-07-22 ENCOUNTER — Ambulatory Visit (HOSPITAL_BASED_OUTPATIENT_CLINIC_OR_DEPARTMENT_OTHER): Payer: Medicare Other | Admitting: Hematology

## 2015-07-22 ENCOUNTER — Ambulatory Visit (HOSPITAL_BASED_OUTPATIENT_CLINIC_OR_DEPARTMENT_OTHER): Payer: Medicare Other

## 2015-07-22 VITALS — BP 147/64 | HR 67 | Temp 98.1°F | Resp 17 | Ht 60.0 in | Wt 125.8 lb

## 2015-07-22 DIAGNOSIS — C651 Malignant neoplasm of right renal pelvis: Secondary | ICD-10-CM

## 2015-07-22 DIAGNOSIS — Z5112 Encounter for antineoplastic immunotherapy: Secondary | ICD-10-CM | POA: Diagnosis not present

## 2015-07-22 DIAGNOSIS — C787 Secondary malignant neoplasm of liver and intrahepatic bile duct: Secondary | ICD-10-CM

## 2015-07-22 DIAGNOSIS — J219 Acute bronchiolitis, unspecified: Secondary | ICD-10-CM

## 2015-07-22 DIAGNOSIS — Z79899 Other long term (current) drug therapy: Secondary | ICD-10-CM

## 2015-07-22 DIAGNOSIS — C7951 Secondary malignant neoplasm of bone: Secondary | ICD-10-CM | POA: Diagnosis not present

## 2015-07-22 DIAGNOSIS — Z452 Encounter for adjustment and management of vascular access device: Secondary | ICD-10-CM | POA: Diagnosis not present

## 2015-07-22 DIAGNOSIS — Z95828 Presence of other vascular implants and grafts: Secondary | ICD-10-CM

## 2015-07-22 DIAGNOSIS — R05 Cough: Secondary | ICD-10-CM

## 2015-07-22 DIAGNOSIS — C7800 Secondary malignant neoplasm of unspecified lung: Secondary | ICD-10-CM | POA: Diagnosis not present

## 2015-07-22 DIAGNOSIS — D649 Anemia, unspecified: Secondary | ICD-10-CM

## 2015-07-22 DIAGNOSIS — C778 Secondary and unspecified malignant neoplasm of lymph nodes of multiple regions: Secondary | ICD-10-CM

## 2015-07-22 DIAGNOSIS — C659 Malignant neoplasm of unspecified renal pelvis: Secondary | ICD-10-CM

## 2015-07-22 LAB — CBC & DIFF AND RETIC
BASO%: 0.2 % (ref 0.0–2.0)
BASOS ABS: 0 10*3/uL (ref 0.0–0.1)
EOS%: 0.7 % (ref 0.0–7.0)
Eosinophils Absolute: 0 10*3/uL (ref 0.0–0.5)
HEMATOCRIT: 34.2 % — AB (ref 34.8–46.6)
HEMOGLOBIN: 11 g/dL — AB (ref 11.6–15.9)
IMMATURE RETIC FRACT: 6.2 % (ref 1.60–10.00)
LYMPH%: 43.8 % (ref 14.0–49.7)
MCH: 30.2 pg (ref 25.1–34.0)
MCHC: 32.2 g/dL (ref 31.5–36.0)
MCV: 94 fL (ref 79.5–101.0)
MONO#: 0.7 10*3/uL (ref 0.1–0.9)
MONO%: 17 % — ABNORMAL HIGH (ref 0.0–14.0)
NEUT%: 38.3 % — ABNORMAL LOW (ref 38.4–76.8)
NEUTROS ABS: 1.6 10*3/uL (ref 1.5–6.5)
Platelets: 251 10*3/uL (ref 145–400)
RBC: 3.64 10*6/uL — ABNORMAL LOW (ref 3.70–5.45)
RDW: 15 % — AB (ref 11.2–14.5)
RETIC %: 1.08 % (ref 0.70–2.10)
RETIC CT ABS: 39.31 10*3/uL (ref 33.70–90.70)
WBC: 4.1 10*3/uL (ref 3.9–10.3)
lymph#: 1.8 10*3/uL (ref 0.9–3.3)

## 2015-07-22 LAB — COMPREHENSIVE METABOLIC PANEL
ALBUMIN: 3.3 g/dL — AB (ref 3.5–5.0)
ALK PHOS: 39 U/L — AB (ref 40–150)
AST: 17 U/L (ref 5–34)
Anion Gap: 8 mEq/L (ref 3–11)
BILIRUBIN TOTAL: 0.38 mg/dL (ref 0.20–1.20)
BUN: 15 mg/dL (ref 7.0–26.0)
CO2: 24 mEq/L (ref 22–29)
CREATININE: 1 mg/dL (ref 0.6–1.1)
Calcium: 9.6 mg/dL (ref 8.4–10.4)
Chloride: 108 mEq/L (ref 98–109)
EGFR: 49 mL/min/{1.73_m2} — ABNORMAL LOW (ref >90)
GLUCOSE: 79 mg/dL (ref 70–140)
POTASSIUM: 4 meq/L (ref 3.5–5.1)
SODIUM: 140 meq/L (ref 136–145)
TOTAL PROTEIN: 7.3 g/dL (ref 6.4–8.3)

## 2015-07-22 LAB — TSH: TSH: 0.956 m(IU)/L (ref 0.308–3.960)

## 2015-07-22 MED ORDER — SODIUM CHLORIDE 0.9 % IV SOLN
1200.0000 mg | Freq: Once | INTRAVENOUS | Status: AC
  Administered 2015-07-22: 1200 mg via INTRAVENOUS
  Filled 2015-07-22: qty 20

## 2015-07-22 MED ORDER — ALTEPLASE 2 MG IJ SOLR
2.0000 mg | Freq: Once | INTRAMUSCULAR | Status: DC | PRN
  Filled 2015-07-22: qty 2

## 2015-07-22 MED ORDER — SODIUM CHLORIDE 0.9 % IJ SOLN
10.0000 mL | INTRAMUSCULAR | Status: DC | PRN
  Administered 2015-07-22: 10 mL via INTRAVENOUS
  Filled 2015-07-22: qty 10

## 2015-07-22 MED ORDER — DENOSUMAB 120 MG/1.7ML ~~LOC~~ SOLN
120.0000 mg | Freq: Once | SUBCUTANEOUS | Status: AC
  Administered 2015-07-22: 120 mg via SUBCUTANEOUS
  Filled 2015-07-22: qty 1.7

## 2015-07-22 MED ORDER — SODIUM CHLORIDE 0.9 % IV SOLN
Freq: Once | INTRAVENOUS | Status: AC
  Administered 2015-07-22: 12:00:00 via INTRAVENOUS

## 2015-07-22 MED ORDER — HEPARIN SOD (PORK) LOCK FLUSH 100 UNIT/ML IV SOLN
500.0000 [IU] | Freq: Once | INTRAVENOUS | Status: AC | PRN
  Administered 2015-07-22: 500 [IU] via INTRAVENOUS
  Filled 2015-07-22: qty 5

## 2015-07-22 NOTE — Patient Instructions (Signed)

## 2015-07-22 NOTE — Telephone Encounter (Signed)
Gave pt appt for may & avs °

## 2015-07-22 NOTE — Patient Instructions (Signed)
Today You received Tencentriq.See attached -printout

## 2015-07-24 NOTE — Progress Notes (Signed)
Hematology oncology clinic follow-up.  Date of service. Marland Kitchen04/21/2017  Patient Care Team: [name], MD as PCP - General (Internal Medicine)   Urologist:Dr. 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  Diagnosis:  Widely metastatic transitional cell carcinoma from the right renal pelvis.  Treatment -Atezolizumab IV q3weeks status post 10 cycles here for her 11th cycle today.  HISTORY OF PRESENTING ILLNESS: Please see my previous note for details of initial presentation.  INTERVAL HISTORY  Tiffany [name] is here for her scheduled follow-up prior to her delayed 11th cycle of Atezolizumab.  Her last dose of treatment was held due to persistent cough and CT findings of pulmonary disease. She was referred and seen by Dr. Melvyn Novas on pulmonary. He was treated with empiric levofloxacin and short course of prednisone. She notes that her symptoms have significantly improved and she has minimal cough at this time. Talk to be likely age-related findings. No clear indication that this was treatment related. Patient is keen to continue treatment which is quite reasonable at this time. Feeling well. Good energy levels. No other acute new concerns.  MEDICAL HISTORY:  Past Medical History  Diagnosis Date  . Hypertension   . Edema leg     feet and ankles  . Headache(784.0)   . Esophagus disorder     Had esophagus stretched  . Arthritis      knees 03-10-12 had Cortisone injection  . Occipital neuralgia   . Ulcerative proctitis ([place])   . DDD (degenerative disc disease), cervical   . GERD (gastroesophageal reflux disease)     Benign stricture dilated in 2011  . Occipital neuralgia     Related to cervical degenerative disc disease  . Gout     Patient notes she has possible gout but has not been on chronic medications for this  . Peptic ulcer disease       Previous history in the remote past  . Ulcerative proctitis ([place]) 2014  . Cancer [facility]) june 2016    metastatic    SURGICAL HISTORY: Past Surgical History  Procedure Laterality Date  . Cholecystectomy    . Appendectomy    . Abdominal hysterectomy    . Dilation and curettage of uterus    . Neck surgery      20 years ago  . Esophagogastroduodenoscopy    . Colonoscopy with propofol  03/25/2012    Procedure: COLONOSCOPY WITH PROPOFOL;  Surgeon: Garlan Fair, MD;  Location: [facility];  Service: Endoscopy;  Laterality: N/A;  . Flexible sigmoidoscopy N/A 04/26/2014    Procedure: FLEXIBLE SIGMOIDOSCOPY - UnSedated;  Surgeon: Garlan Fair, MD;  Location: [facility];  Service: Endoscopy;  Laterality: N/A;  . Esophageal dilation      For benign stricture in 2011  . Total abdominal hysterectomy w/ bilateral salpingoophorectomy      At age 6 due to miscarriage and retained products of conception causing significant uterine bleeding. Patient has been on estrogen replacement therapy since then.  . Flexible sigmoidoscopy N/A 01/24/2015    Procedure: FLEXIBLE SIGNMOIDOSCOPY W/ FLEET ENEMIA;  Surgeon: Garlan Fair, MD;  Location: [facility];  Service: Endoscopy;  Laterality: N/A;    SOCIAL HISTORY: Social History   Social History  . Marital Status: Married    Spouse Name: N/A  . Number of Children: N/A  . Years of Education: N/A   Occupational History  . Not  on file.   Social History Main Topics  . Smoking status: Former Smoker    Quit date: 11/01/1983  . Smokeless tobacco: Never Used  . Alcohol Use: Yes     Comment: occ  . Drug Use: No  . Sexual Activity: Not on file   Other Topics Concern  . Not on file   Social History Narrative  Retired as Conservation officer, nature for [employer]. She is very well spoken and intelligent individual and exhibits a keen knowledge of her medical conditions.  FAMILY HISTORY: Family History  Problem Relation Age of  Onset  . Lung cancer Sister   . Prostate cancer Brother   . Uterine cancer Maternal Aunt   . Breast cancer Paternal Grandmother   . Hodgkin's lymphoma Sister     ALLERGIES:  is allergic to nitrofurantoin; indomethacin; and lisinopril.  MEDICATIONS:  Current Outpatient Prescriptions  Medication Sig Dispense Refill  . acetaminophen (TYLENOL) 650 MG CR tablet Take 1,300 mg by mouth 2 (two) times daily.    Marland Kitchen amLODipine (NORVASC) 5 MG tablet Take 5 mg by mouth daily.    Marland Kitchen CANASA 1000 MG suppository Place 1,000 mg rectally daily as needed.   11  . estradiol (ESTRACE) 0.5 MG tablet Take 0.5 mg by mouth daily.  1  . HYDROcodone-acetaminophen (NORCO/VICODIN) 5-325 MG tablet Take 1-2 tablets by mouth every 4 (four) hours as needed for moderate pain or severe pain. 20 tablet 0  . mirtazapine (REMERON) 15 MG tablet Take 1 tablet (15 mg total) by mouth at bedtime. 30 tablet 2  . ondansetron (ZOFRAN) 4 MG tablet Take 1 tablet (4 mg total) by mouth every 8 (eight) hours as needed for nausea or vomiting. 30 tablet 0  . OVER THE COUNTER MEDICATION Apply 1 application topically. [place] dream over the counter cream for arthritis    . oxyCODONE (OXY IR/ROXICODONE) 5 MG immediate release tablet Take 0.5-1 tablets (2.5-5 mg total) by mouth every 4 (four) hours as needed for moderate pain, severe pain or breakthrough pain. 60 tablet 0  . PRESCRIPTION MEDICATION Chemo card    . Respiratory Therapy Supplies (FLUTTER) DEVI 1 each by Does not apply route daily. 1 each 0  . senna-docusate (SENNA S) 8.6-50 MG per tablet Take 2 tablets by mouth at bedtime. 60 tablet 1  . triamcinolone ointment (KENALOG) 0.5 % Apply 1 application topically 2 (two) times daily. (Patient taking differently: Apply 1 application topically 2 (two) times daily as needed (for rash). ) 30 g 0   No current facility-administered medications for this visit.   REVIEW OF SYSTEMS:    10 point review of systems done and is negative except as  noted above  PHYSICAL EXAMINATION: ECOG PERFORMANCE STATUS: 1  Filed Vitals:   07/22/15 0955  BP: 147/64  Pulse: 67  Temp: 98.1 F (36.7 C)  Resp: 17   Filed Weights   07/22/15 0955  Weight: 125 lb 12.8 oz (57.063 kg)   GENERAL elderly Caucasian female, alert, no distress and comfortable SKIN: resolved skin rashes EYES: normal, conjunctiva are pink and non-injected, sclera clear OROPHARYNX:no exudate, no erythema and lips, buccal mucosa, and tongue normal  NECK: supple, thyroid normal size, non-tender, without nodularity LYMPH:  no palpable lymphadenopathy in the cervical, axillary or inguinal LUNGS: few bibasilar rales, scattered rhonci, good air entry. HEART: regular rate & rhythm and no murmurs and no lower extremity edema ABDOMEN:abdomen soft, non-tender and normal bowel sounds Musculoskeletal:no cyanosis of digits and no clubbing  PSYCH: alert & oriented x 3 with fluent speech NEURO: no focal motor/sensory deficits  LABORATORY DATA:  CBC Latest Ref Rng 07/22/2015 07/01/2015 06/10/2015  WBC 3.9 - 10.3 10e3/uL 4.1 3.9 4.7  Hemoglobin 11.6 - 15.9 g/dL 11.0(L) 10.8(L) 10.5(L)  Hematocrit 34.8 - 46.6 % 34.2(L) 33.8(L) 33.3(L)  Platelets 145 - 400 10e3/uL 251 286 322     Recent Labs  10/16/14 1611 10/17/14 0353  06/10/15 0933 07/01/15 0823 07/22/15 0930  NA 135 135  < > 140 139 140  K 4.5 3.5  < > 4.2 4.1 4.0  CL 101 101  --   --   --   --   CO2 24 24  < > 25 25 24   GLUCOSE 174* 130*  < > 101 108 79  BUN 21* 20  < > 17.2 17.7 15.0  CREATININE 1.52* 1.25*  < > 1.0 1.0 1.0  CALCIUM 9.8 8.9  < > 9.1 9.2 9.6  GFRNONAA 29* 37*  --   --   --   --   GFRAA 34* 43*  --   --   --   --   PROT  --   --   < > 7.3 7.3 7.3  ALBUMIN  --   --   < > 3.2* 3.3* 3.3*  AST  --   --   < > 15 16 17   ALT  --   --   < > <9 <9 <9  ALKPHOS  --   --   < > 45 40 39*  BILITOT  --   --   < > 0.39 0.45 0.38  < > = values in this interval not displayed.    RADIOGRAPHIC STUDIES:  PET/CT  04/27/2015: IMPRESSION: 1. No evidence of metastatic disease. Hepatic and osseous lesions do not show abnormal hypermetabolism. 2. Scattered patchy peribronchovascular nodularity, ground-glass and slight consolidation in the lungs, new from prior exam and mildly hypermetabolic. Infectious bronchiolitis/ bronchopneumonia is favored. 3. Three-vessel coronary artery calcification. 4. Chronic appearing right hydronephrosis.   Electronically Signed  By: Lorin Picket M.D.  On: 04/27/2015 10:01   ASSESSMENT & PLAN:   82 year old Caucasian female with  #1 Right Renal pelvis metastatic transitional cell carcinoma.  PET scan revealed metastases to the lungs, liver, multiple nodal stations and bones. She is status post 10 cycles of Atezolizumab. PET scan done after 8 cycles of treatment for restaging disease showed significant response to treatment with no evidence of active disease. Patient has no clinical evidence of disease progression at this time. #2 Urethral pains and bladder spasms/ discomfort. Urine cultures negative. Symptoms have now completely resolved. #3 skin rash likely related to her Atezolizumab. Was grade 1 and is now resolved with topical triamcinolone. #4  S/p Mucous and slight blood in stools. No diarrhea C. difficile negative. Symptoms resolved with mesalamine suppositories that she had been started on by GI .currently off these with stable symptoms. #5 COugh and clear mucous with dark flecks. No significant shortness of breath. Patient had a repeat CT chest which shows increasing bronchiectasis and GGO - ? Atypical infection / pneumonitis/ILD due to metastatic malignancy vs Atezolizumab. Evaluated by Dr. Melvyn Novas from pulmonology. This was thought to be likely age-related and over presented on the CT scan compared to the x-ray. Treated with levofloxacin and a short burst of prednisone with significant improvement in symptoms. Plan  --We'll continue  Atezolizumab. Appreciate input by Dr. Melvyn Novas from pulmonary . Patient has continued follow-up. --Continue current pain  management.needing minimal pain medications at this time. -Continue Xgeva every 6 weeks -Continue triamcinolone ointment for her skin rash as needed. -Continue mesalamine suppositories as per Dr. Hassell Done for ulcerative proctitis- using on an as needed basis and hasnt needed it recently. -We'll consider repeat PET/CT after cycle 14 of treatment unless new symptoms arise.  #6 Normocytic normochromic anemia likely due to anemia of chronic disease from her metastatic malignancy , medication and chronic kidney disease. hgb stable and h as improved to 11. Previously had some GIB which has resolved. -We will monitor and treat with when necessary blood transfusions if symptomatic.  #7 history of hypertension patient's blood pressure has been lower given associated weight loss. Now that the patient has gained back some of her weight and is eating well . Weight has been stable.  Wt Readings from Last 3 Encounters:  07/22/15 125 lb 12.8 oz (57.063 kg)  07/19/15 127 lb (57.607 kg)  07/06/15 126 lb 12.8 oz (57.516 kg)    -on Amlodipine 5mg  po daily   RTC with Dr Irene Limbo in 3 weeks prior to next cycle of treatment with rpt labs. Earlier followup if any new concerns.  All questions were answered. The patient knows to call the clinic with any problems, questions or concerns. I spent 25 minutes counseling the patient face to face. The total time spent in the appointment was 25 minutes and more than 50% was on counseling.   Sullivan Lone MD [place] Hematology/Oncology Physician [facility]  (Office):       [phone] (Work cell):  [phone] (Fax):           [phone]

## 2015-08-11 ENCOUNTER — Other Ambulatory Visit: Payer: Self-pay | Admitting: *Deleted

## 2015-08-11 DIAGNOSIS — C651 Malignant neoplasm of right renal pelvis: Secondary | ICD-10-CM

## 2015-08-12 ENCOUNTER — Other Ambulatory Visit (HOSPITAL_BASED_OUTPATIENT_CLINIC_OR_DEPARTMENT_OTHER): Payer: Medicare Other

## 2015-08-12 ENCOUNTER — Ambulatory Visit (HOSPITAL_BASED_OUTPATIENT_CLINIC_OR_DEPARTMENT_OTHER): Payer: Medicare Other

## 2015-08-12 ENCOUNTER — Encounter: Payer: Self-pay | Admitting: Hematology

## 2015-08-12 ENCOUNTER — Ambulatory Visit: Payer: Medicare Other

## 2015-08-12 ENCOUNTER — Telehealth: Payer: Self-pay | Admitting: Hematology

## 2015-08-12 ENCOUNTER — Ambulatory Visit (HOSPITAL_BASED_OUTPATIENT_CLINIC_OR_DEPARTMENT_OTHER): Payer: Medicare Other | Admitting: Hematology

## 2015-08-12 VITALS — BP 159/65 | HR 72 | Temp 97.8°F | Resp 17 | Ht 60.0 in | Wt 125.4 lb

## 2015-08-12 DIAGNOSIS — C651 Malignant neoplasm of right renal pelvis: Secondary | ICD-10-CM

## 2015-08-12 DIAGNOSIS — C787 Secondary malignant neoplasm of liver and intrahepatic bile duct: Secondary | ICD-10-CM

## 2015-08-12 DIAGNOSIS — C778 Secondary and unspecified malignant neoplasm of lymph nodes of multiple regions: Secondary | ICD-10-CM

## 2015-08-12 DIAGNOSIS — Z95828 Presence of other vascular implants and grafts: Secondary | ICD-10-CM

## 2015-08-12 DIAGNOSIS — C7951 Secondary malignant neoplasm of bone: Secondary | ICD-10-CM

## 2015-08-12 DIAGNOSIS — C659 Malignant neoplasm of unspecified renal pelvis: Secondary | ICD-10-CM

## 2015-08-12 DIAGNOSIS — R05 Cough: Secondary | ICD-10-CM

## 2015-08-12 DIAGNOSIS — I10 Essential (primary) hypertension: Secondary | ICD-10-CM

## 2015-08-12 DIAGNOSIS — R21 Rash and other nonspecific skin eruption: Secondary | ICD-10-CM

## 2015-08-12 DIAGNOSIS — C7800 Secondary malignant neoplasm of unspecified lung: Secondary | ICD-10-CM | POA: Diagnosis not present

## 2015-08-12 DIAGNOSIS — D649 Anemia, unspecified: Secondary | ICD-10-CM

## 2015-08-12 DIAGNOSIS — Z5112 Encounter for antineoplastic immunotherapy: Secondary | ICD-10-CM

## 2015-08-12 LAB — CBC WITH DIFFERENTIAL/PLATELET
BASO%: 0.2 % (ref 0.0–2.0)
BASOS ABS: 0 10*3/uL (ref 0.0–0.1)
EOS ABS: 0 10*3/uL (ref 0.0–0.5)
EOS%: 1 % (ref 0.0–7.0)
HCT: 35.9 % (ref 34.8–46.6)
HGB: 11.6 g/dL (ref 11.6–15.9)
LYMPH%: 51.8 % — AB (ref 14.0–49.7)
MCH: 30.3 pg (ref 25.1–34.0)
MCHC: 32.3 g/dL (ref 31.5–36.0)
MCV: 93.7 fL (ref 79.5–101.0)
MONO#: 0.7 10*3/uL (ref 0.1–0.9)
MONO%: 15.7 % — AB (ref 0.0–14.0)
NEUT#: 1.3 10*3/uL — ABNORMAL LOW (ref 1.5–6.5)
NEUT%: 31.3 % — ABNORMAL LOW (ref 38.4–76.8)
PLATELETS: 286 10*3/uL (ref 145–400)
RBC: 3.83 10*6/uL (ref 3.70–5.45)
RDW: 14.9 % — ABNORMAL HIGH (ref 11.2–14.5)
WBC: 4.2 10*3/uL (ref 3.9–10.3)
lymph#: 2.2 10*3/uL (ref 0.9–3.3)

## 2015-08-12 LAB — COMPREHENSIVE METABOLIC PANEL
ALBUMIN: 3.6 g/dL (ref 3.5–5.0)
ALK PHOS: 37 U/L — AB (ref 40–150)
ALT: <9 U/L (ref 0–55)
ANION GAP: 7 meq/L (ref 3–11)
AST: 16 U/L (ref 5–34)
BILIRUBIN TOTAL: 0.34 mg/dL (ref 0.20–1.20)
BUN: 15.4 mg/dL (ref 7.0–26.0)
CO2: 25 meq/L (ref 22–29)
Calcium: 9.4 mg/dL (ref 8.4–10.4)
Chloride: 109 mEq/L (ref 98–109)
Creatinine: 1 mg/dL (ref 0.6–1.1)
EGFR: 49 mL/min/{1.73_m2} — AB (ref >90)
GLUCOSE: 73 mg/dL (ref 70–140)
POTASSIUM: 4.2 meq/L (ref 3.5–5.1)
SODIUM: 141 meq/L (ref 136–145)
Total Protein: 7.5 g/dL (ref 6.4–8.3)

## 2015-08-12 MED ORDER — HEPARIN SOD (PORK) LOCK FLUSH 100 UNIT/ML IV SOLN
500.0000 [IU] | Freq: Once | INTRAVENOUS | Status: AC | PRN
  Administered 2015-08-12: 500 [IU]
  Filled 2015-08-12: qty 5

## 2015-08-12 MED ORDER — SODIUM CHLORIDE 0.9 % IJ SOLN
10.0000 mL | INTRAMUSCULAR | Status: DC | PRN
Start: 2015-08-12 — End: 2015-08-12
  Administered 2015-08-12: 10 mL
  Filled 2015-08-12: qty 10

## 2015-08-12 MED ORDER — SODIUM CHLORIDE 0.9 % IV SOLN
Freq: Once | INTRAVENOUS | Status: AC
  Administered 2015-08-12: 12:00:00 via INTRAVENOUS

## 2015-08-12 MED ORDER — SODIUM CHLORIDE 0.9 % IJ SOLN
10.0000 mL | INTRAMUSCULAR | Status: DC | PRN
  Administered 2015-08-12: 10 mL via INTRAVENOUS
  Filled 2015-08-12: qty 10

## 2015-08-12 MED ORDER — SODIUM CHLORIDE 0.9 % IV SOLN
1200.0000 mg | Freq: Once | INTRAVENOUS | Status: AC
  Administered 2015-08-12: 1200 mg via INTRAVENOUS
  Filled 2015-08-12: qty 20

## 2015-08-12 NOTE — Patient Instructions (Signed)

## 2015-08-12 NOTE — Telephone Encounter (Signed)
Gave pt apt & avs °

## 2015-08-12 NOTE — Progress Notes (Signed)
Ok to treat with ANC 1.3 per Dr. Irene Limbo.

## 2015-08-12 NOTE — Patient Instructions (Signed)
[facility] Discharge Instructions for Patients Receiving Chemotherapy  Today you received the following chemotherapy agents Tecentriq To help prevent nausea and vomiting after your treatment, we encourage you to take your nausea medication as prescribed.   If you develop nausea and vomiting that is not controlled by your nausea medication, call the clinic.   BELOW ARE SYMPTOMS THAT SHOULD BE REPORTED IMMEDIATELY:  *FEVER GREATER THAN 100.5 F  *CHILLS WITH OR WITHOUT FEVER  NAUSEA AND VOMITING THAT IS NOT CONTROLLED WITH YOUR NAUSEA MEDICATION  *UNUSUAL SHORTNESS OF BREATH  *UNUSUAL BRUISING OR BLEEDING  TENDERNESS IN MOUTH AND THROAT WITH OR WITHOUT PRESENCE OF ULCERS  *URINARY PROBLEMS  *BOWEL PROBLEMS  UNUSUAL RASH Items with * indicate a potential emergency and should be followed up as soon as possible.  Feel free to call the clinic you have any questions or concerns. The clinic phone number is [phone].  Please show the CHEMO ALERT CARD at check-in to the Emergency Department and triage nurse.   

## 2015-08-14 NOTE — Progress Notes (Signed)
Hematology oncology clinic follow-up.  Date of service. Marland Kitchen05/03/2016  Patient Care Team: [name], MD as PCP - General (Internal Medicine)   Urologist:Dr. 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  Diagnosis:  Widely metastatic transitional cell carcinoma from the right renal pelvis.  Treatment -Atezolizumab IV q3weeks status post 10 cycles here for her 11th cycle today.  HISTORY OF PRESENTING ILLNESS: Please see my previous note for details of initial presentation.  INTERVAL HISTORY  Tiffany [name] is here for her scheduled follow-up prior to her nextr cycle of Atezolizumab. Notes she is feeling well. COugh nearly resolved. No CP no SOB. No fevers/chils. No skin rashes. No other acute new symptoms.  MEDICAL HISTORY:  Past Medical History  Diagnosis Date  . Hypertension   . Edema leg     feet and ankles  . Headache(784.0)   . Esophagus disorder     Had esophagus stretched  . Arthritis      knees 03-10-12 had Cortisone injection  . Occipital neuralgia   . Ulcerative proctitis ([place])   . DDD (degenerative disc disease), cervical   . GERD (gastroesophageal reflux disease)     Benign stricture dilated in 2011  . Occipital neuralgia     Related to cervical degenerative disc disease  . Gout     Patient notes she has possible gout but has not been on chronic medications for this  . Peptic ulcer disease     Previous history in the remote past  . Ulcerative proctitis ([place]) 2014  . Cancer [facility]) june 2016    metastatic    SURGICAL HISTORY: Past Surgical History  Procedure Laterality Date  . Cholecystectomy    . Appendectomy    . Abdominal hysterectomy    . Dilation and curettage of uterus    . Neck surgery      20 years ago  . Esophagogastroduodenoscopy    . Colonoscopy with propofol  03/25/2012    Procedure:  COLONOSCOPY WITH PROPOFOL;  Surgeon: Garlan Fair, MD;  [facility];  Service: Endoscopy;  Laterality: N/A;  . Flexible sigmoidoscopy N/A 04/26/2014    Procedure: FLEXIBLE SIGMOIDOSCOPY - UnSedated;  Surgeon: Garlan Fair, MD;  Location: [facility];  Service: Endoscopy;  Laterality: N/A;  . Esophageal dilation      For benign stricture in 2011  . Total abdominal hysterectomy w/ bilateral salpingoophorectomy      At age 51 due to miscarriage and retained products of conception causing significant uterine bleeding. Patient has been on estrogen replacement therapy since then.  . Flexible sigmoidoscopy N/A 01/24/2015    Procedure: FLEXIBLE SIGNMOIDOSCOPY W/ FLEET ENEMIA;  Surgeon: Garlan Fair, MD;  [facility]: [facility];  Service: Endoscopy;  Laterality: N/A;    SOCIAL HISTORY: Social History   Social History  . Marital Status: Married    Spouse Name: N/A  . Number of Children: N/A  . Years of Education: N/A   Occupational History  . Not on file.   Social History Main Topics  . Smoking status: Former Smoker    Quit date: 11/01/1983  . Smokeless tobacco: Never Used  . Alcohol Use: Yes     Comment: occ  . Drug Use: No  . Sexual Activity: Not on file   Other Topics Concern  . 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 She is very well spoken and intelligent individual and exhibits a keen knowledge of her medical conditions.  FAMILY HISTORY: Family History  Problem Relation Age of Onset  . Lung cancer Sister   . Prostate cancer Brother   . Uterine cancer Maternal Aunt   . Breast cancer Paternal Grandmother   . Hodgkin's lymphoma Sister     ALLERGIES:  is allergic to nitrofurantoin; indomethacin; and lisinopril.  MEDICATIONS:  Current Outpatient Prescriptions  Medication Sig Dispense Refill  . acetaminophen (TYLENOL) 650 MG CR tablet Take 1,300 mg by mouth 2 (two) times daily.    Marland Kitchen  amLODipine (NORVASC) 5 MG tablet Take 5 mg by mouth daily.    Marland Kitchen CANASA 1000 MG suppository Place 1,000 mg rectally daily as needed.   11  . estradiol (ESTRACE) 0.5 MG tablet Take 0.5 mg by mouth daily.  1  . guaiFENesin (MUCINEX) 600 MG 12 hr tablet Take by mouth 2 (two) times daily.    Marland Kitchen HYDROcodone-acetaminophen (NORCO/VICODIN) 5-325 MG tablet Take 1-2 tablets by mouth every 4 (four) hours as needed for moderate pain or severe pain. 20 tablet 0  . mirtazapine (REMERON) 15 MG tablet Take 1 tablet (15 mg total) by mouth at bedtime. 30 tablet 2  . ondansetron (ZOFRAN) 4 MG tablet Take 1 tablet (4 mg total) by mouth every 8 (eight) hours as needed for nausea or vomiting. 30 tablet 0  . OVER THE COUNTER MEDICATION Apply 1 application topically. [place] dream over the counter cream for arthritis    . oxyCODONE (OXY IR/ROXICODONE) 5 MG immediate release tablet Take 0.5-1 tablets (2.5-5 mg total) by mouth every 4 (four) hours as needed for moderate pain, severe pain or breakthrough pain. 60 tablet 0  . PRESCRIPTION MEDICATION Chemo card    . Respiratory Therapy Supplies (FLUTTER) DEVI 1 each by Does not apply route daily. 1 each 0  . senna-docusate (SENNA S) 8.6-50 MG per tablet Take 2 tablets by mouth at bedtime. 60 tablet 1  . triamcinolone ointment (KENALOG) 0.5 % Apply 1 application topically 2 (two) times daily. (Patient taking differently: Apply 1 application topically 2 (two) times daily as needed (for rash). ) 30 g 0   No current facility-administered medications for this visit.   REVIEW OF SYSTEMS:    10 point review of systems done and is negative except as noted above  PHYSICAL EXAMINATION: ECOG PERFORMANCE STATUS: 1  Filed Vitals:   08/12/15 1100  BP: 159/65  Pulse: 72  Temp: 97.8 F (36.6 C)  Resp: 17   Filed Weights   08/12/15 1100  Weight: 125 lb 6.4 oz (56.881 kg)   GENERAL elderly Caucasian female, alert, no distress and comfortable SKIN: resolved skin rashes EYES:  normal, conjunctiva are pink and non-injected, sclera clear OROPHARYNX:no exudate, no erythema and lips, buccal mucosa, and tongue normal  NECK: supple, thyroid normal size, non-tender, without nodularity LYMPH:  no palpable lymphadenopathy in the cervical, axillary or inguinal LUNGS: few bibasilar rales, scattered rhonci, good air entry. HEART: regular rate & rhythm and no murmurs and no lower extremity edema ABDOMEN:abdomen soft, non-tender and normal bowel sounds Musculoskeletal:no cyanosis of digits and no clubbing  PSYCH: alert & oriented x 3 with fluent speech NEURO: no focal motor/sensory deficits  LABORATORY DATA:  CBC Latest Ref Rng 08/12/2015 07/22/2015 07/01/2015  WBC 3.9 - 10.3 10e3/uL 4.2 4.1 3.9  Hemoglobin 11.6 - 15.9 g/dL 11.6 11.0(L) 10.8(L)  Hematocrit 34.8 - 46.6 % 35.9 34.2(L) 33.8(L)  Platelets  145 - 400 10e3/uL 286 251 286   . CMP Latest Ref Rng 08/12/2015 07/22/2015 07/01/2015  Glucose 70 - 140 mg/dl 73 79 108  BUN 7.0 - 26.0 mg/dL 15.4 15.0 17.7  Creatinine 0.6 - 1.1 mg/dL 1.0 1.0 1.0  Sodium 136 - 145 mEq/L 141 140 139  Potassium 3.5 - 5.1 mEq/L 4.2 4.0 4.1  CO2 22 - 29 mEq/L 25 24 25   Calcium 8.4 - 10.4 mg/dL 9.4 9.6 9.2  Total Protein 6.4 - 8.3 g/dL 7.5 7.3 7.3  Total Bilirubin 0.20 - 1.20 mg/dL 0.34 0.38 0.45  Alkaline Phos 40 - 150 U/L 37(L) 39(L) 40  AST 5 - 34 U/L 16 17 16   ALT 0 - 55 U/L <9 <9 <9   ASSESSMENT & PLAN:   82 year old Caucasian female with  #1 Right Renal pelvis metastatic transitional cell carcinoma.  PET scan revealed metastases to the lungs, liver, multiple nodal stations and bones. She is status post 11 cycles of Atezolizumab. PET scan done after 8 cycles of treatment for restaging disease showed significant response to treatment with no evidence of active disease. Patient has no clinical evidence of disease progression at this time. CT chest 06/16/2015 showed no evidence of cancer progression.  #2 skin rash likely related to her  Atezolizumab. Was grade 1 and is now resolved with topical triamcinolone. #3  COugh and some clear secretions due to bronchiectasis - evaluated by pulmonary. . Plan  --We'll continue Atezolizumab. -Continue Xgeva every 6 weeks -Continue triamcinolone ointment for her skin rash as needed. -Continue mesalamine suppositories as per Dr. Hassell Done for ulcerative proctitis- using on an as needed basis and hasnt needed it recently. -rpt PETG/CT after 2 cycles -We'll consider repeat PET/CT after cycle 14 of treatment unless new symptoms arise.  #6 Normocytic normochromic anemia likely due to anemia of chronic disease from her metastatic malignancy , medication and chronic kidney disease. hgb improved to 11.6. Previously had some GIB which has resolved. -We will monitor and treat with when necessary blood transfusions if symptomatic.  #7 history of hypertension   -on Amlodipine 5mg  po daily  RTC with Dr Irene Limbo in 3 weeks prior to next cycle of treatment with rpt labs. Earlier followup if any new concerns.   Sullivan Lone MD [place] Hematology/Oncology Physician [facility]  (Office):       [phone] (Work cell):  [phone] (Fax):           [phone]

## 2015-09-01 ENCOUNTER — Other Ambulatory Visit: Payer: Self-pay | Admitting: *Deleted

## 2015-09-01 DIAGNOSIS — C651 Malignant neoplasm of right renal pelvis: Secondary | ICD-10-CM

## 2015-09-02 ENCOUNTER — Ambulatory Visit (HOSPITAL_BASED_OUTPATIENT_CLINIC_OR_DEPARTMENT_OTHER): Payer: Medicare Other

## 2015-09-02 ENCOUNTER — Encounter: Payer: Self-pay | Admitting: Hematology

## 2015-09-02 ENCOUNTER — Ambulatory Visit (HOSPITAL_BASED_OUTPATIENT_CLINIC_OR_DEPARTMENT_OTHER): Payer: Medicare Other | Admitting: Hematology

## 2015-09-02 ENCOUNTER — Other Ambulatory Visit (HOSPITAL_BASED_OUTPATIENT_CLINIC_OR_DEPARTMENT_OTHER): Payer: Medicare Other

## 2015-09-02 ENCOUNTER — Ambulatory Visit: Payer: Medicare Other

## 2015-09-02 ENCOUNTER — Telehealth: Payer: Self-pay | Admitting: Hematology

## 2015-09-02 VITALS — BP 164/65 | HR 74 | Temp 98.1°F | Resp 17 | Ht 60.0 in | Wt 125.5 lb

## 2015-09-02 DIAGNOSIS — Z79899 Other long term (current) drug therapy: Secondary | ICD-10-CM | POA: Diagnosis not present

## 2015-09-02 DIAGNOSIS — C778 Secondary and unspecified malignant neoplasm of lymph nodes of multiple regions: Secondary | ICD-10-CM

## 2015-09-02 DIAGNOSIS — Z95828 Presence of other vascular implants and grafts: Secondary | ICD-10-CM

## 2015-09-02 DIAGNOSIS — D649 Anemia, unspecified: Secondary | ICD-10-CM

## 2015-09-02 DIAGNOSIS — C787 Secondary malignant neoplasm of liver and intrahepatic bile duct: Secondary | ICD-10-CM

## 2015-09-02 DIAGNOSIS — I10 Essential (primary) hypertension: Secondary | ICD-10-CM

## 2015-09-02 DIAGNOSIS — C7951 Secondary malignant neoplasm of bone: Secondary | ICD-10-CM

## 2015-09-02 DIAGNOSIS — Z5112 Encounter for antineoplastic immunotherapy: Secondary | ICD-10-CM | POA: Diagnosis not present

## 2015-09-02 DIAGNOSIS — R21 Rash and other nonspecific skin eruption: Secondary | ICD-10-CM

## 2015-09-02 DIAGNOSIS — C651 Malignant neoplasm of right renal pelvis: Secondary | ICD-10-CM | POA: Diagnosis not present

## 2015-09-02 DIAGNOSIS — R05 Cough: Secondary | ICD-10-CM

## 2015-09-02 DIAGNOSIS — C659 Malignant neoplasm of unspecified renal pelvis: Secondary | ICD-10-CM

## 2015-09-02 DIAGNOSIS — C7800 Secondary malignant neoplasm of unspecified lung: Secondary | ICD-10-CM

## 2015-09-02 LAB — COMPREHENSIVE METABOLIC PANEL
ALT: <9 U/L (ref 0–55)
ANION GAP: 9 meq/L (ref 3–11)
AST: 17 U/L (ref 5–34)
Albumin: 3.7 g/dL (ref 3.5–5.0)
Alkaline Phosphatase: 37 U/L — ABNORMAL LOW (ref 40–150)
BILIRUBIN TOTAL: 0.55 mg/dL (ref 0.20–1.20)
BUN: 15.6 mg/dL (ref 7.0–26.0)
CHLORIDE: 106 meq/L (ref 98–109)
CO2: 25 meq/L (ref 22–29)
Calcium: 9.6 mg/dL (ref 8.4–10.4)
Creatinine: 1.1 mg/dL (ref 0.6–1.1)
EGFR: 46 mL/min/{1.73_m2} — AB (ref >90)
GLUCOSE: 75 mg/dL (ref 70–140)
POTASSIUM: 4.1 meq/L (ref 3.5–5.1)
SODIUM: 140 meq/L (ref 136–145)
TOTAL PROTEIN: 7.9 g/dL (ref 6.4–8.3)

## 2015-09-02 LAB — CBC & DIFF AND RETIC
BASO%: 0.2 % (ref 0.0–2.0)
Basophils Absolute: 0 10*3/uL (ref 0.0–0.1)
EOS%: 0.8 % (ref 0.0–7.0)
Eosinophils Absolute: 0 10*3/uL (ref 0.0–0.5)
HCT: 35.6 % (ref 34.8–46.6)
HEMOGLOBIN: 11.7 g/dL (ref 11.6–15.9)
IMMATURE RETIC FRACT: 7.8 % (ref 1.60–10.00)
LYMPH#: 2 10*3/uL (ref 0.9–3.3)
LYMPH%: 39.4 % (ref 14.0–49.7)
MCH: 30.6 pg (ref 25.1–34.0)
MCHC: 32.9 g/dL (ref 31.5–36.0)
MCV: 93.2 fL (ref 79.5–101.0)
MONO#: 0.8 10*3/uL (ref 0.1–0.9)
MONO%: 16.6 % — AB (ref 0.0–14.0)
NEUT#: 2.1 10*3/uL (ref 1.5–6.5)
NEUT%: 43 % (ref 38.4–76.8)
Platelets: 294 10*3/uL (ref 145–400)
RBC: 3.82 10*6/uL (ref 3.70–5.45)
RDW: 14.7 % — AB (ref 11.2–14.5)
RETIC %: 1.56 % (ref 0.70–2.10)
Retic Ct Abs: 59.59 10*3/uL (ref 33.70–90.70)
WBC: 5 10*3/uL (ref 3.9–10.3)

## 2015-09-02 LAB — TSH: TSH: 0.974 m[IU]/L (ref 0.308–3.960)

## 2015-09-02 MED ORDER — HEPARIN SOD (PORK) LOCK FLUSH 100 UNIT/ML IV SOLN
500.0000 [IU] | Freq: Once | INTRAVENOUS | Status: DC | PRN
  Filled 2015-09-02: qty 5

## 2015-09-02 MED ORDER — SODIUM CHLORIDE 0.9 % IJ SOLN
10.0000 mL | INTRAMUSCULAR | Status: DC | PRN
  Administered 2015-09-02: 10 mL via INTRAVENOUS
  Filled 2015-09-02: qty 10

## 2015-09-02 MED ORDER — DENOSUMAB 120 MG/1.7ML ~~LOC~~ SOLN
120.0000 mg | Freq: Once | SUBCUTANEOUS | Status: AC
  Administered 2015-09-02: 120 mg via SUBCUTANEOUS
  Filled 2015-09-02: qty 1.7

## 2015-09-02 MED ORDER — ALTEPLASE 2 MG IJ SOLR
2.0000 mg | Freq: Once | INTRAMUSCULAR | Status: DC | PRN
  Filled 2015-09-02: qty 2

## 2015-09-02 MED ORDER — SODIUM CHLORIDE 0.9 % IV SOLN
Freq: Once | INTRAVENOUS | Status: AC
  Administered 2015-09-02: 13:00:00 via INTRAVENOUS

## 2015-09-02 MED ORDER — SODIUM CHLORIDE 0.9 % IJ SOLN
10.0000 mL | INTRAMUSCULAR | Status: DC | PRN
  Administered 2015-09-02: 10 mL
  Filled 2015-09-02: qty 10

## 2015-09-02 MED ORDER — HEPARIN SOD (PORK) LOCK FLUSH 100 UNIT/ML IV SOLN
500.0000 [IU] | Freq: Once | INTRAVENOUS | Status: AC | PRN
  Administered 2015-09-02: 500 [IU]
  Filled 2015-09-02: qty 5

## 2015-09-02 MED ORDER — SODIUM CHLORIDE 0.9 % IV SOLN
1200.0000 mg | Freq: Once | INTRAVENOUS | Status: AC
  Administered 2015-09-02: 1200 mg via INTRAVENOUS
  Filled 2015-09-02: qty 20

## 2015-09-02 NOTE — Telephone Encounter (Signed)
Gave pt apt & avs °

## 2015-09-02 NOTE — Patient Instructions (Signed)
[facility] Discharge Instructions for Patients Receiving Chemotherapy  Today you received the following chemotherapy agents Tecentriq To help prevent nausea and vomiting after your treatment, we encourage you to take your nausea medication as prescribed.   If you develop nausea and vomiting that is not controlled by your nausea medication, call the clinic.   BELOW ARE SYMPTOMS THAT SHOULD BE REPORTED IMMEDIATELY:  *FEVER GREATER THAN 100.5 F  *CHILLS WITH OR WITHOUT FEVER  NAUSEA AND VOMITING THAT IS NOT CONTROLLED WITH YOUR NAUSEA MEDICATION  *UNUSUAL SHORTNESS OF BREATH  *UNUSUAL BRUISING OR BLEEDING  TENDERNESS IN MOUTH AND THROAT WITH OR WITHOUT PRESENCE OF ULCERS  *URINARY PROBLEMS  *BOWEL PROBLEMS  UNUSUAL RASH Items with * indicate a potential emergency and should be followed up as soon as possible.  Feel free to call the clinic you have any questions or concerns. The clinic phone number is [phone].  Please show the CHEMO ALERT CARD at check-in to the Emergency Department and triage nurse.   

## 2015-09-02 NOTE — Patient Instructions (Signed)

## 2015-09-08 NOTE — Progress Notes (Signed)
Hematology oncology clinic follow-up.  Date of service. .09/08/2015   Patient Care Team: [name], MD as PCP - General (Internal Medicine)   Urologist: Dr. 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  Diagnosis:  Widely metastatic transitional cell carcinoma from the right renal pelvis.  Treatment -Atezolizumab IV q3weeks status post 10 cycles here for her 13th cycle today.  HISTORY OF PRESENTING ILLNESS: Please see my previous note for details of initial presentation.  INTERVAL HISTORY  Ms Derocher is here for her scheduled follow-up prior to her next cycle of Atezolizumab. She reports that she is still having some cough with intermittent brownish phlegm. No chest pain. No shortness of breath. No dyspnea on exertion. No fevers/chils. No skin rashes. No other acute new symptoms. She reports she has a follow-up with Dr. Melvyn Novas in pulmonary later this month.  MEDICAL HISTORY:  Past Medical History  Diagnosis Date  . Hypertension   . Edema leg     feet and ankles  . Headache(784.0)   . Esophagus disorder     Had esophagus stretched  . Arthritis      knees 03-10-12 had Cortisone injection  . Occipital neuralgia   . Ulcerative proctitis ([place])   . DDD (degenerative disc disease), cervical   . GERD (gastroesophageal reflux disease)     Benign stricture dilated in 2011  . Occipital neuralgia     Related to cervical degenerative disc disease  . Gout     Patient notes she has possible gout but has not been on chronic medications for this  . Peptic ulcer disease     Previous history in the remote past  . Ulcerative proctitis ([place]) 2014  . Cancer [facility]) june 2016    metastatic    SURGICAL HISTORY: Past Surgical History  Procedure Laterality Date  . Cholecystectomy    . Appendectomy    . Abdominal hysterectomy    . Dilation  and curettage of uterus    . Neck surgery      20 years ago  . Esophagogastroduodenoscopy    . Colonoscopy with propofol  03/25/2012    Procedure: COLONOSCOPY WITH PROPOFOL;  Surgeon: Garlan Fair, MD;  Location: [facility];  Service: Endoscopy;  Laterality: N/A;  . Flexible sigmoidoscopy N/A 04/26/2014    Procedure: FLEXIBLE SIGMOIDOSCOPY - UnSedated;  Surgeon: Garlan Fair, MD;  Location: [facility];  Service: Endoscopy;  Laterality: N/A;  . Esophageal dilation      For benign stricture in 2011  . Total abdominal hysterectomy w/ bilateral salpingoophorectomy      At age 30 due to miscarriage and retained products of conception causing significant uterine bleeding. Patient has been on estrogen replacement therapy since then.  . Flexible sigmoidoscopy N/A 01/24/2015    Procedure: FLEXIBLE SIGNMOIDOSCOPY W/ FLEET ENEMIA;  Surgeon: Garlan Fair, MD;  [facility];  Service: Endoscopy;  Laterality: N/A;    SOCIAL HISTORY: Social History   Social History  . Marital Status: Married    Spouse Name: N/A  . Number of Children: N/A  . Years of Education: N/A   Occupational History  . Not on file.   Social History Main Topics  . Smoking status: Former Smoker    Quit date: 11/01/1983  . Smokeless tobacco: Never Used  . Alcohol Use: Yes     Comment: occ  . Drug Use: No  .  Sexual Activity: Not on file   Other Topics Concern  . Not on file   Social History Narrative  Retired as Conservation officer, nature for [employer]. She is very well spoken and intelligent individual and exhibits a keen knowledge of her medical conditions.  FAMILY HISTORY: Family History  Problem Relation Age of Onset  . Lung cancer Sister   . Prostate cancer Brother   . Uterine cancer Maternal Aunt   . Breast cancer Paternal Grandmother   . Hodgkin's lymphoma Sister     ALLERGIES:  is allergic to nitrofurantoin; indomethacin; and lisinopril.  MEDICATIONS:  Current  Outpatient Prescriptions  Medication Sig Dispense Refill  . acetaminophen (TYLENOL) 650 MG CR tablet Take 1,300 mg by mouth 2 (two) times daily.    Marland Kitchen amLODipine (NORVASC) 5 MG tablet Take 5 mg by mouth daily.    Marland Kitchen CANASA 1000 MG suppository Place 1,000 mg rectally daily as needed.   11  . estradiol (ESTRACE) 0.5 MG tablet Take 0.5 mg by mouth daily.  1  . guaiFENesin (MUCINEX) 600 MG 12 hr tablet Take by mouth 2 (two) times daily.    Marland Kitchen HYDROcodone-acetaminophen (NORCO/VICODIN) 5-325 MG tablet Take 1-2 tablets by mouth every 4 (four) hours as needed for moderate pain or severe pain. 20 tablet 0  . mirtazapine (REMERON) 15 MG tablet Take 1 tablet (15 mg total) by mouth at bedtime. 30 tablet 2  . ondansetron (ZOFRAN) 4 MG tablet Take 1 tablet (4 mg total) by mouth every 8 (eight) hours as needed for nausea or vomiting. 30 tablet 0  . OVER THE COUNTER MEDICATION Apply 1 application topically. [place] dream over the counter cream for arthritis    . oxyCODONE (OXY IR/ROXICODONE) 5 MG immediate release tablet Take 0.5-1 tablets (2.5-5 mg total) by mouth every 4 (four) hours as needed for moderate pain, severe pain or breakthrough pain. 60 tablet 0  . PRESCRIPTION MEDICATION Chemo card    . Respiratory Therapy Supplies (FLUTTER) DEVI 1 each by Does not apply route daily. 1 each 0  . senna-docusate (SENNA S) 8.6-50 MG per tablet Take 2 tablets by mouth at bedtime. 60 tablet 1  . triamcinolone ointment (KENALOG) 0.5 % Apply 1 application topically 2 (two) times daily. (Patient taking differently: Apply 1 application topically 2 (two) times daily as needed (for rash). ) 30 g 0   No current facility-administered medications for this visit.   REVIEW OF SYSTEMS:    10 point review of systems done and is negative except as noted above  PHYSICAL EXAMINATION: ECOG PERFORMANCE STATUS: 1  Filed Vitals:   09/02/15 1145  BP: 164/65  Pulse: 74  Temp: 98.1 F (36.7 C)  Resp: 17   Filed Weights    09/02/15 1145  Weight: 125 lb 8 oz (56.926 kg)   GENERAL elderly Caucasian female, alert, no distress and comfortable SKIN: resolved skin rashes EYES: normal, conjunctiva are pink and non-injected, sclera clear OROPHARYNX:no exudate, no erythema and lips, buccal mucosa, and tongue normal  NECK: supple, thyroid normal size, non-tender, without nodularity LYMPH:  no palpable lymphadenopathy in the cervical, axillary or inguinal LUNGS: few bibasilar rales, scattered rhonci, good air entry. HEART: regular rate & rhythm and no murmurs and no lower extremity edema ABDOMEN:abdomen soft, non-tender and normal bowel sounds Musculoskeletal:no cyanosis of digits and no clubbing  PSYCH: alert & oriented x 3 with fluent speech NEURO: no focal motor/sensory deficits  LABORATORY DATA:  CBC Latest Ref Rng 09/02/2015 08/12/2015 07/22/2015  WBC 3.9 - 10.3 10e3/uL 5.0 4.2 4.1  Hemoglobin 11.6 - 15.9 g/dL 11.7 11.6 11.0(L)  Hematocrit 34.8 - 46.6 % 35.6 35.9 34.2(L)  Platelets 145 - 400 10e3/uL 294 286 251   . CMP Latest Ref Rng 09/02/2015 08/12/2015 07/22/2015  Glucose 70 - 140 mg/dl 75 73 79  BUN 7.0 - 26.0 mg/dL 15.6 15.4 15.0  Creatinine 0.6 - 1.1 mg/dL 1.1 1.0 1.0  Sodium 136 - 145 mEq/L 140 141 140  Potassium 3.5 - 5.1 mEq/L 4.1 4.2 4.0  CO2 22 - 29 mEq/L 25 25 24   Calcium 8.4 - 10.4 mg/dL 9.6 9.4 9.6  Total Protein 6.4 - 8.3 g/dL 7.9 7.5 7.3  Total Bilirubin 0.20 - 1.20 mg/dL 0.55 0.34 0.38  Alkaline Phos 40 - 150 U/L 37(L) 37(L) 39(L)  AST 5 - 34 U/L 17 16 17   ALT 0 - 55 U/L <9 <9 <9   ASSESSMENT & PLAN:   82 year old Caucasian female with  #1 Right Renal pelvis metastatic transitional cell carcinoma.  PET scan revealed metastases to the lungs, liver, multiple nodal stations and bones. She is status post 11 cycles of Atezolizumab. PET scan done after 8 cycles of treatment for restaging disease showed significant response to treatment with no evidence of active disease. Patient has no  clinical evidence of disease progression at this time. CT chest 06/16/2015 showed no evidence of cancer progression.  #2 skin rash likely related to her Atezolizumab. Was grade 1 and is now resolved with topical triamcinolone. #3  COugh and some clear secretions likely due to bronchiectasis - evaluated by pulmonary. He has no progression of symptoms. No chest pain no increased shortness of breath or dyspnea on exertion . Plan  --We'll continue Atezolizumab. -Continue Xgeva every 6 weeks -Continue triamcinolone ointment for her skin rash as needed. (Has not needed this for the last few months). -Continue mesalamine suppositories as per Dr. Hassell Done for ulcerative proctitis- using on an as needed basis and hasnt needed it recently. - repeat PET/CT after cycle 14 of treatment unless new symptoms arise.  #6 Normocytic normochromic anemia likely due to anemia of chronic disease from her metastatic malignancy , medication and chronic kidney disease. hgb improved to 11.7. Previously had some GIB which has resolved. -We will monitor and treat with when necessary blood transfusions if symptomatic.  #7 history of hypertension   -on Amlodipine 5mg  po daily  RTC with Dr Irene Limbo in 3 weeks prior to next cycle of treatment with rpt labs. Earlier followup if any new concerns.   Sullivan Lone MD [place] Hematology/Oncology Physician [facility]  (Office):       [phone] (Work cell):  [phone] (Fax):           [phone]

## 2015-09-13 DIAGNOSIS — I10 Essential (primary) hypertension: Secondary | ICD-10-CM | POA: Diagnosis not present

## 2015-09-13 DIAGNOSIS — C689 Malignant neoplasm of urinary organ, unspecified: Secondary | ICD-10-CM | POA: Diagnosis not present

## 2015-09-13 DIAGNOSIS — R05 Cough: Secondary | ICD-10-CM | POA: Diagnosis not present

## 2015-09-15 ENCOUNTER — Telehealth: Payer: Self-pay | Admitting: Hematology

## 2015-09-15 NOTE — Telephone Encounter (Signed)
cld & spoke to pt and adv MD pappt moved to 6/22@3  and lab/flush/MD@11  on 6/23-adv per dr Chilton Greathouse he will be at [facility]

## 2015-09-22 ENCOUNTER — Encounter: Payer: Self-pay | Admitting: Hematology

## 2015-09-22 ENCOUNTER — Ambulatory Visit (HOSPITAL_BASED_OUTPATIENT_CLINIC_OR_DEPARTMENT_OTHER): Payer: Medicare Other | Admitting: Hematology

## 2015-09-22 ENCOUNTER — Other Ambulatory Visit: Payer: Medicare Other

## 2015-09-22 VITALS — BP 146/65 | HR 92 | Temp 98.2°F | Resp 17 | Ht 60.0 in | Wt 126.2 lb

## 2015-09-22 DIAGNOSIS — C651 Malignant neoplasm of right renal pelvis: Secondary | ICD-10-CM | POA: Diagnosis not present

## 2015-09-22 DIAGNOSIS — C778 Secondary and unspecified malignant neoplasm of lymph nodes of multiple regions: Secondary | ICD-10-CM

## 2015-09-22 DIAGNOSIS — C787 Secondary malignant neoplasm of liver and intrahepatic bile duct: Secondary | ICD-10-CM

## 2015-09-22 DIAGNOSIS — C7951 Secondary malignant neoplasm of bone: Secondary | ICD-10-CM

## 2015-09-22 DIAGNOSIS — C7800 Secondary malignant neoplasm of unspecified lung: Secondary | ICD-10-CM | POA: Diagnosis not present

## 2015-09-22 DIAGNOSIS — I10 Essential (primary) hypertension: Secondary | ICD-10-CM

## 2015-09-22 DIAGNOSIS — R05 Cough: Secondary | ICD-10-CM

## 2015-09-23 ENCOUNTER — Ambulatory Visit: Payer: Medicare Other

## 2015-09-23 ENCOUNTER — Other Ambulatory Visit: Payer: Medicare Other

## 2015-09-23 ENCOUNTER — Other Ambulatory Visit (HOSPITAL_BASED_OUTPATIENT_CLINIC_OR_DEPARTMENT_OTHER): Payer: Medicare Other

## 2015-09-23 ENCOUNTER — Ambulatory Visit: Payer: Medicare Other | Admitting: Hematology

## 2015-09-23 ENCOUNTER — Ambulatory Visit (HOSPITAL_BASED_OUTPATIENT_CLINIC_OR_DEPARTMENT_OTHER): Payer: Medicare Other

## 2015-09-23 VITALS — BP 136/78 | HR 79 | Temp 98.0°F | Resp 18

## 2015-09-23 DIAGNOSIS — Z5112 Encounter for antineoplastic immunotherapy: Secondary | ICD-10-CM | POA: Diagnosis not present

## 2015-09-23 DIAGNOSIS — C651 Malignant neoplasm of right renal pelvis: Secondary | ICD-10-CM

## 2015-09-23 DIAGNOSIS — C787 Secondary malignant neoplasm of liver and intrahepatic bile duct: Secondary | ICD-10-CM

## 2015-09-23 DIAGNOSIS — Z95828 Presence of other vascular implants and grafts: Secondary | ICD-10-CM

## 2015-09-23 DIAGNOSIS — C659 Malignant neoplasm of unspecified renal pelvis: Secondary | ICD-10-CM

## 2015-09-23 LAB — CBC WITH DIFFERENTIAL/PLATELET
BASO%: 0.3 % (ref 0.0–2.0)
Basophils Absolute: 0 10*3/uL (ref 0.0–0.1)
EOS ABS: 0.2 10*3/uL (ref 0.0–0.5)
EOS%: 2.7 % (ref 0.0–7.0)
HCT: 33.9 % — ABNORMAL LOW (ref 34.8–46.6)
HEMOGLOBIN: 11.1 g/dL — AB (ref 11.6–15.9)
LYMPH%: 33.1 % (ref 14.0–49.7)
MCH: 30.5 pg (ref 25.1–34.0)
MCHC: 32.9 g/dL (ref 31.5–36.0)
MCV: 92.7 fL (ref 79.5–101.0)
MONO#: 1.2 10*3/uL — AB (ref 0.1–0.9)
MONO%: 16.9 % — ABNORMAL HIGH (ref 0.0–14.0)
NEUT%: 47 % (ref 38.4–76.8)
NEUTROS ABS: 3.2 10*3/uL (ref 1.5–6.5)
PLATELETS: 310 10*3/uL (ref 145–400)
RBC: 3.66 10*6/uL — AB (ref 3.70–5.45)
RDW: 15.5 % — AB (ref 11.2–14.5)
WBC: 6.8 10*3/uL (ref 3.9–10.3)
lymph#: 2.3 10*3/uL (ref 0.9–3.3)

## 2015-09-23 LAB — COMPREHENSIVE METABOLIC PANEL
ALBUMIN: 3.5 g/dL (ref 3.5–5.0)
ANION GAP: 9 meq/L (ref 3–11)
AST: 16 U/L (ref 5–34)
Alkaline Phosphatase: 38 U/L — ABNORMAL LOW (ref 40–150)
BILIRUBIN TOTAL: 0.37 mg/dL (ref 0.20–1.20)
BUN: 18.5 mg/dL (ref 7.0–26.0)
CO2: 25 meq/L (ref 22–29)
CREATININE: 1.1 mg/dL (ref 0.6–1.1)
Calcium: 9.7 mg/dL (ref 8.4–10.4)
Chloride: 105 mEq/L (ref 98–109)
EGFR: 45 mL/min/{1.73_m2} — AB (ref >90)
GLUCOSE: 69 mg/dL — AB (ref 70–140)
Potassium: 4.3 mEq/L (ref 3.5–5.1)
SODIUM: 139 meq/L (ref 136–145)
TOTAL PROTEIN: 7.8 g/dL (ref 6.4–8.3)

## 2015-09-23 MED ORDER — SODIUM CHLORIDE 0.9 % IJ SOLN
10.0000 mL | INTRAMUSCULAR | Status: DC | PRN
  Administered 2015-09-23: 10 mL via INTRAVENOUS
  Filled 2015-09-23: qty 10

## 2015-09-23 MED ORDER — SODIUM CHLORIDE 0.9 % IJ SOLN
10.0000 mL | INTRAMUSCULAR | Status: DC | PRN
  Administered 2015-09-23: 10 mL
  Filled 2015-09-23: qty 10

## 2015-09-23 MED ORDER — HEPARIN SOD (PORK) LOCK FLUSH 100 UNIT/ML IV SOLN
500.0000 [IU] | Freq: Once | INTRAVENOUS | Status: AC | PRN
  Administered 2015-09-23: 500 [IU]
  Filled 2015-09-23: qty 5

## 2015-09-23 MED ORDER — SODIUM CHLORIDE 0.9 % IV SOLN
1200.0000 mg | Freq: Once | INTRAVENOUS | Status: AC
  Administered 2015-09-23: 1200 mg via INTRAVENOUS
  Filled 2015-09-23: qty 20

## 2015-09-23 MED ORDER — SODIUM CHLORIDE 0.9 % IV SOLN
Freq: Once | INTRAVENOUS | Status: AC
  Administered 2015-09-23: 12:00:00 via INTRAVENOUS

## 2015-09-23 NOTE — Patient Instructions (Signed)
[facility] Discharge Instructions for Patients Receiving Chemotherapy  Today you received the following chemotherapy agents: Tecentriq  To help prevent nausea and vomiting after your treatment, we encourage you to take your nausea medication as directed.    If you develop nausea and vomiting that is not controlled by your nausea medication, call the clinic.   BELOW ARE SYMPTOMS THAT SHOULD BE REPORTED IMMEDIATELY:  *FEVER GREATER THAN 100.5 F  *CHILLS WITH OR WITHOUT FEVER  NAUSEA AND VOMITING THAT IS NOT CONTROLLED WITH YOUR NAUSEA MEDICATION  *UNUSUAL SHORTNESS OF BREATH  *UNUSUAL BRUISING OR BLEEDING  TENDERNESS IN MOUTH AND THROAT WITH OR WITHOUT PRESENCE OF ULCERS  *URINARY PROBLEMS  *BOWEL PROBLEMS  UNUSUAL RASH Items with * indicate a potential emergency and should be followed up as soon as possible.  Feel free to call the clinic you have any questions or concerns. The clinic phone number is [phone].  Please show the CHEMO ALERT CARD at check-in to the Emergency Department and triage nurse.   

## 2015-09-23 NOTE — Patient Instructions (Signed)

## 2015-09-25 NOTE — Progress Notes (Signed)
Hematology oncology clinic follow-up.  Date of service. 09/22/2015   Patient Care Team: [name], MD as PCP - General (Internal Medicine)   Urologist: Dr. 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  Diagnosis:  Widely metastatic transitional cell carcinoma from the right renal pelvis.  Treatment -Atezolizumab IV q3weeks status post 13 cycles here for her 14th cycle.  HISTORY OF PRESENTING ILLNESS: Please see my previous note for details of initial presentation.  INTERVAL HISTORY  [name] is here for her scheduled follow-up prior to her next cycle of Atezolizumab. She notes some cough with intermittent increased brownish phlegm. No fevers or chills. No new changes in breathing. No shortness of breath or new dyspnea on exertion. Has an upcoming follow-up with Dr. Melvyn Novas from pulmonary. Notes minimal rectal bleeding. Was seen by her gastroenterologist. Restarted on salicylate suppositories for proctitis as previously.   MEDICAL HISTORY:  Past Medical History  Diagnosis Date  . Hypertension   . Edema leg     feet and ankles  . Headache(784.0)   . Esophagus disorder     Had esophagus stretched  . Arthritis      knees 03-10-12 had Cortisone injection  . Occipital neuralgia   . Ulcerative proctitis ([place])   . DDD (degenerative disc disease), cervical   . GERD (gastroesophageal reflux disease)     Benign stricture dilated in 2011  . Occipital neuralgia     Related to cervical degenerative disc disease  . Gout     Patient notes she has possible gout but has not been on chronic medications for this  . Peptic ulcer disease     Previous history in the remote past  . Ulcerative proctitis ([place]) 2014  . Cancer [facility]) june 2016    metastatic    SURGICAL HISTORY: Past Surgical History  Procedure Laterality Date  .  Cholecystectomy    . Appendectomy    . Abdominal hysterectomy    . Dilation and curettage of uterus    . Neck surgery      20 years ago  . Esophagogastroduodenoscopy    . Colonoscopy with propofol  03/25/2012    Procedure: COLONOSCOPY WITH PROPOFOL;  Surgeon: Garlan Fair, MD;  [facility];  Service: Endoscopy;  Laterality: N/A;  . Flexible sigmoidoscopy N/A 04/26/2014    Procedure: FLEXIBLE SIGMOIDOSCOPY - UnSedated;  Surgeon: Garlan Fair, MD;  [facility];  Service: Endoscopy;  Laterality: N/A;  . Esophageal dilation      For benign stricture in 2011  . Total abdominal hysterectomy w/ bilateral salpingoophorectomy      At age 62 due to miscarriage and retained products of conception causing significant uterine bleeding. Patient has been on estrogen replacement therapy since then.  . Flexible sigmoidoscopy N/A 01/24/2015    Procedure: FLEXIBLE SIGNMOIDOSCOPY W/ FLEET ENEMIA;  Surgeon: Garlan Fair, MD;  [facility];  Service: Endoscopy;  Laterality: N/A;    SOCIAL HISTORY: Social History   Social History  . Marital Status: Married    Spouse Name: N/A  . Number of Children: N/A  . Years of Education: N/A   Occupational History  . Not on file.   Social History Main Topics  . Smoking status: Former Smoker    Quit date: 11/01/1983  . Smokeless tobacco: Never Used  . Alcohol Use: Yes     Comment: occ  .  Drug Use: No  . Sexual Activity: Not on file   Other Topics Concern  . Not on file   Social History Narrative  Retired as Conservation officer, nature for [employer]. She is very well spoken and intelligent individual and exhibits a keen knowledge of her medical conditions.  FAMILY HISTORY: Family History  Problem Relation Age of Onset  . Lung cancer Sister   . Prostate cancer Brother   . Uterine cancer Maternal Aunt   . Breast cancer Paternal Grandmother   . Hodgkin's lymphoma Sister     ALLERGIES:  is allergic to  nitrofurantoin; indomethacin; and lisinopril.  MEDICATIONS:  Current Outpatient Prescriptions  Medication Sig Dispense Refill  . acetaminophen (TYLENOL) 650 MG CR tablet Take 1,300 mg by mouth 2 (two) times daily.    Marland Kitchen amLODipine (NORVASC) 5 MG tablet Take 5 mg by mouth daily.    Marland Kitchen CANASA 1000 MG suppository Place 1,000 mg rectally daily as needed.   11  . estradiol (ESTRACE) 0.5 MG tablet Take 0.5 mg by mouth daily.  1  . guaiFENesin (MUCINEX) 600 MG 12 hr tablet Take by mouth 2 (two) times daily.    Marland Kitchen HYDROcodone-acetaminophen (NORCO/VICODIN) 5-325 MG tablet Take 1-2 tablets by mouth every 4 (four) hours as needed for moderate pain or severe pain. 20 tablet 0  . mirtazapine (REMERON) 15 MG tablet Take 1 tablet (15 mg total) by mouth at bedtime. 30 tablet 2  . ondansetron (ZOFRAN) 4 MG tablet Take 1 tablet (4 mg total) by mouth every 8 (eight) hours as needed for nausea or vomiting. 30 tablet 0  . OVER THE COUNTER MEDICATION Apply 1 application topically. [place] dream over the counter cream for arthritis    . oxyCODONE (OXY IR/ROXICODONE) 5 MG immediate release tablet Take 0.5-1 tablets (2.5-5 mg total) by mouth every 4 (four) hours as needed for moderate pain, severe pain or breakthrough pain. 60 tablet 0  . PRESCRIPTION MEDICATION Chemo card    . Respiratory Therapy Supplies (FLUTTER) DEVI 1 each by Does not apply route daily. 1 each 0  . senna-docusate (SENNA S) 8.6-50 MG per tablet Take 2 tablets by mouth at bedtime. 60 tablet 1  . triamcinolone ointment (KENALOG) 0.5 % Apply 1 application topically 2 (two) times daily. (Patient taking differently: Apply 1 application topically 2 (two) times daily as needed (for rash). ) 30 g 0   No current facility-administered medications for this visit.   REVIEW OF SYSTEMS:    10 point review of systems done and is negative except as noted above  PHYSICAL EXAMINATION: ECOG PERFORMANCE STATUS: 1  Filed Vitals:   09/22/15 1456  BP: 146/65    Pulse: 92  Temp: 98.2 F (36.8 C)  Resp: 17   Filed Weights   09/22/15 1456  Weight: 126 lb 3.2 oz (57.244 kg)   GENERAL elderly Caucasian female, alert, no distress and comfortable SKIN: resolved skin rashes EYES: normal, conjunctiva are pink and non-injected, sclera clear OROPHARYNX:no exudate, no erythema and lips, buccal mucosa, and tongue normal  NECK: supple, thyroid normal size, non-tender, without nodularity LYMPH:  no palpable lymphadenopathy in the cervical, axillary or inguinal LUNGS: few bibasilar rales, scattered rhonci, good air entry. HEART: regular rate & rhythm and no murmurs and no lower extremity edema ABDOMEN:abdomen soft, non-tender and normal bowel sounds Musculoskeletal:no cyanosis of digits and no clubbing  PSYCH: alert & oriented x 3 with fluent speech NEURO: no focal motor/sensory deficits  LABORATORY DATA:  CBC Latest  Ref Rng 09/23/2015 09/02/2015 08/12/2015  WBC 3.9 - 10.3 10e3/uL 6.8 5.0 4.2  Hemoglobin 11.6 - 15.9 g/dL 11.1(L) 11.7 11.6  Hematocrit 34.8 - 46.6 % 33.9(L) 35.6 35.9  Platelets 145 - 400 10e3/uL 310 294 286   . CMP Latest Ref Rng 09/23/2015 09/02/2015 08/12/2015  Glucose 70 - 140 mg/dl 69(L) 75 73  BUN 7.0 - 26.0 mg/dL 18.5 15.6 15.4  Creatinine 0.6 - 1.1 mg/dL 1.1 1.1 1.0  Sodium 136 - 145 mEq/L 139 140 141  Potassium 3.5 - 5.1 mEq/L 4.3 4.1 4.2  CO2 22 - 29 mEq/L 25 25 25   Calcium 8.4 - 10.4 mg/dL 9.7 9.6 9.4  Total Protein 6.4 - 8.3 g/dL 7.8 7.9 7.5  Total Bilirubin 0.20 - 1.20 mg/dL 0.37 0.55 0.34  Alkaline Phos 40 - 150 U/L 38(L) 37(L) 37(L)  AST 5 - 34 U/L 16 17 16   ALT 0 - 55 U/L <9 <9 <9   ASSESSMENT & PLAN:   82 year old Caucasian female with  #1 Right Renal pelvis metastatic transitional cell carcinoma.  PET scan revealed metastases to the lungs, liver, multiple nodal stations and bones. She is status post 13 cycles of Atezolizumab. PET scan done after 8 cycles of treatment for restaging disease showed significant  response to treatment with no evidence of active disease. Patient has no clinical evidence of disease progression at this time. CT chest 06/16/2015 showed no evidence of cancer progression.  #2 h/o skin rash likely related to her Atezolizumab. Was grade 1 and is now resolved with topical triamcinolone. #3 Cough and some clear secretions likely due to bronchiectasis - evaluated by pulmonary. He has no progression of symptoms. No chest pain no increased shortness of breath or dyspnea on exertion . Plan  --We'll continue Atezolizumab. -Continue Xgeva every 6 weeks -Continue triamcinolone ointment for her skin rash as needed. (Has not needed this for the last few months). -Continue mesalamine suppositories as per Dr. Hassell Done for ulcerative proctitis- using on an as needed basis and hasnt needed it recently. - repeat PET/CT prior to next cycle of Atezolizumab.  #4 history of hypertension   -on Amlodipine 5mg  po daily  RTC with Dr Irene Limbo in 3 weeks prior to next cycle of treatment with rpt labs and PET/CT scan. Earlier followup if any new concerns.   Sullivan Lone MD [place] Hematology/Oncology Physician [facility]  (Office):       [phone] (Work cell):  [phone] (Fax):           [phone]

## 2015-09-29 DIAGNOSIS — M10072 Idiopathic gout, left ankle and foot: Secondary | ICD-10-CM | POA: Diagnosis not present

## 2015-10-07 DIAGNOSIS — M10072 Idiopathic gout, left ankle and foot: Secondary | ICD-10-CM | POA: Diagnosis not present

## 2015-10-10 ENCOUNTER — Other Ambulatory Visit: Payer: Self-pay | Admitting: Hematology

## 2015-10-13 ENCOUNTER — Other Ambulatory Visit: Payer: Self-pay | Admitting: *Deleted

## 2015-10-13 DIAGNOSIS — C651 Malignant neoplasm of right renal pelvis: Secondary | ICD-10-CM

## 2015-10-14 ENCOUNTER — Ambulatory Visit (HOSPITAL_BASED_OUTPATIENT_CLINIC_OR_DEPARTMENT_OTHER): Payer: Medicare Other | Admitting: Hematology

## 2015-10-14 ENCOUNTER — Other Ambulatory Visit (HOSPITAL_BASED_OUTPATIENT_CLINIC_OR_DEPARTMENT_OTHER): Payer: Medicare Other

## 2015-10-14 ENCOUNTER — Ambulatory Visit (HOSPITAL_BASED_OUTPATIENT_CLINIC_OR_DEPARTMENT_OTHER): Payer: Medicare Other

## 2015-10-14 ENCOUNTER — Ambulatory Visit: Payer: Medicare Other

## 2015-10-14 ENCOUNTER — Telehealth: Payer: Self-pay | Admitting: Hematology

## 2015-10-14 VITALS — BP 155/71 | HR 72 | Temp 97.9°F | Resp 18 | Ht 60.0 in | Wt 124.8 lb

## 2015-10-14 DIAGNOSIS — C651 Malignant neoplasm of right renal pelvis: Secondary | ICD-10-CM

## 2015-10-14 DIAGNOSIS — C7951 Secondary malignant neoplasm of bone: Secondary | ICD-10-CM

## 2015-10-14 DIAGNOSIS — C7800 Secondary malignant neoplasm of unspecified lung: Secondary | ICD-10-CM

## 2015-10-14 DIAGNOSIS — Z5112 Encounter for antineoplastic immunotherapy: Secondary | ICD-10-CM | POA: Diagnosis not present

## 2015-10-14 DIAGNOSIS — R21 Rash and other nonspecific skin eruption: Secondary | ICD-10-CM

## 2015-10-14 DIAGNOSIS — Z95828 Presence of other vascular implants and grafts: Secondary | ICD-10-CM

## 2015-10-14 DIAGNOSIS — C778 Secondary and unspecified malignant neoplasm of lymph nodes of multiple regions: Secondary | ICD-10-CM | POA: Diagnosis not present

## 2015-10-14 DIAGNOSIS — C787 Secondary malignant neoplasm of liver and intrahepatic bile duct: Secondary | ICD-10-CM

## 2015-10-14 DIAGNOSIS — C659 Malignant neoplasm of unspecified renal pelvis: Secondary | ICD-10-CM

## 2015-10-14 DIAGNOSIS — R05 Cough: Secondary | ICD-10-CM

## 2015-10-14 LAB — COMPREHENSIVE METABOLIC PANEL
ALT: 13 U/L (ref 0–55)
ANION GAP: 10 meq/L (ref 3–11)
AST: 22 U/L (ref 5–34)
Albumin: 3.5 g/dL (ref 3.5–5.0)
Alkaline Phosphatase: 44 U/L (ref 40–150)
BUN: 13.7 mg/dL (ref 7.0–26.0)
CALCIUM: 9.1 mg/dL (ref 8.4–10.4)
CHLORIDE: 106 meq/L (ref 98–109)
CO2: 24 meq/L (ref 22–29)
CREATININE: 1 mg/dL (ref 0.6–1.1)
EGFR: 47 mL/min/{1.73_m2} — ABNORMAL LOW (ref >90)
Glucose: 75 mg/dl (ref 70–140)
POTASSIUM: 4.1 meq/L (ref 3.5–5.1)
Sodium: 141 mEq/L (ref 136–145)
Total Bilirubin: 0.49 mg/dL (ref 0.20–1.20)
Total Protein: 7.4 g/dL (ref 6.4–8.3)

## 2015-10-14 LAB — CBC WITH DIFFERENTIAL/PLATELET
BASO%: 0.8 % (ref 0.0–2.0)
BASOS ABS: 0 10*3/uL (ref 0.0–0.1)
EOS%: 1.2 % (ref 0.0–7.0)
Eosinophils Absolute: 0.1 10*3/uL (ref 0.0–0.5)
HEMATOCRIT: 35.4 % (ref 34.8–46.6)
HGB: 11.8 g/dL (ref 11.6–15.9)
LYMPH%: 35.8 % (ref 14.0–49.7)
MCH: 31.1 pg (ref 25.1–34.0)
MCHC: 33.3 g/dL (ref 31.5–36.0)
MCV: 93.4 fL (ref 79.5–101.0)
MONO#: 0.9 10*3/uL (ref 0.1–0.9)
MONO%: 17.3 % — ABNORMAL HIGH (ref 0.0–14.0)
NEUT#: 2.4 10*3/uL (ref 1.5–6.5)
NEUT%: 44.9 % (ref 38.4–76.8)
PLATELETS: 276 10*3/uL (ref 145–400)
RBC: 3.79 10*6/uL (ref 3.70–5.45)
RDW: 15.1 % — ABNORMAL HIGH (ref 11.2–14.5)
WBC: 5.4 10*3/uL (ref 3.9–10.3)
lymph#: 1.9 10*3/uL (ref 0.9–3.3)

## 2015-10-14 MED ORDER — HEPARIN SOD (PORK) LOCK FLUSH 100 UNIT/ML IV SOLN
500.0000 [IU] | Freq: Once | INTRAVENOUS | Status: DC | PRN
  Filled 2015-10-14: qty 5

## 2015-10-14 MED ORDER — SODIUM CHLORIDE 0.9 % IJ SOLN
10.0000 mL | INTRAMUSCULAR | Status: DC | PRN
  Administered 2015-10-14: 10 mL
  Filled 2015-10-14: qty 10

## 2015-10-14 MED ORDER — HEPARIN SOD (PORK) LOCK FLUSH 100 UNIT/ML IV SOLN
500.0000 [IU] | Freq: Once | INTRAVENOUS | Status: AC | PRN
  Administered 2015-10-14: 500 [IU]
  Filled 2015-10-14: qty 5

## 2015-10-14 MED ORDER — SODIUM CHLORIDE 0.9 % IV SOLN
Freq: Once | INTRAVENOUS | Status: AC
  Administered 2015-10-14: 13:00:00 via INTRAVENOUS

## 2015-10-14 MED ORDER — SODIUM CHLORIDE 0.9 % IV SOLN
1200.0000 mg | Freq: Once | INTRAVENOUS | Status: AC
  Administered 2015-10-14: 1200 mg via INTRAVENOUS
  Filled 2015-10-14: qty 20

## 2015-10-14 MED ORDER — DENOSUMAB 120 MG/1.7ML ~~LOC~~ SOLN
120.0000 mg | Freq: Once | SUBCUTANEOUS | Status: AC
  Administered 2015-10-14: 120 mg via SUBCUTANEOUS
  Filled 2015-10-14: qty 1.7

## 2015-10-14 MED ORDER — SODIUM CHLORIDE 0.9 % IJ SOLN
10.0000 mL | INTRAMUSCULAR | Status: DC | PRN
  Administered 2015-10-14: 10 mL via INTRAVENOUS
  Filled 2015-10-14: qty 10

## 2015-10-14 MED ORDER — ALTEPLASE 2 MG IJ SOLR
2.0000 mg | Freq: Once | INTRAMUSCULAR | Status: DC | PRN
  Filled 2015-10-14: qty 2

## 2015-10-14 NOTE — Telephone Encounter (Signed)
per pof to sch pt appt-gave pt copy of avs °

## 2015-10-14 NOTE — Patient Instructions (Signed)

## 2015-10-17 NOTE — Progress Notes (Signed)
Hematology oncology clinic follow-up.  Date of service. 10/14/2015    Patient Care Team: [name], MD as PCP - General (Internal Medicine)   Urologist: Dr. 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  Diagnosis:  Widely metastatic transitional cell carcinoma from the right renal pelvis.  Treatment -Atezolizumab IV q3weeks status post 14 cycles here for her 15th cycle.  HISTORY OF PRESENTING ILLNESS: Please see my previous notes for details of initial presentation.  INTERVAL HISTORY  Ms Ruis is here for her scheduled follow-up prior to her next cycle of Atezolizumab. She notes that she had a little bit of cough and was started on prednisone by primary care physician and notes that her cough is much better. She will be seen Dr. Melvyn Novas later this month. Minimal rectal bleeding which is better. Hemoglobin is stable. Energy levels have been the same. Weight has been stable. No other acute new symptoms. Has been scheduled for a PET/CT scan.   MEDICAL HISTORY:  Past Medical History  Diagnosis Date  . Hypertension   . Edema leg     feet and ankles  . Headache(784.0)   . Esophagus disorder     Had esophagus stretched  . Arthritis      knees 03-10-12 had Cortisone injection  . Occipital neuralgia   . Ulcerative proctitis ([place])   . DDD (degenerative disc disease), cervical   . GERD (gastroesophageal reflux disease)     Benign stricture dilated in 2011  . Occipital neuralgia     Related to cervical degenerative disc disease  . Gout     Patient notes she has possible gout but has not been on chronic medications for this  . Peptic ulcer disease     Previous history in the remote past  . Ulcerative proctitis ([place]) 2014  . Cancer [facility]) june 2016    metastatic    SURGICAL HISTORY: Past Surgical History  Procedure Laterality  Date  . Cholecystectomy    . Appendectomy    . Abdominal hysterectomy    . Dilation and curettage of uterus    . Neck surgery      20 years ago  . Esophagogastroduodenoscopy    . Colonoscopy with propofol  03/25/2012    Procedure: COLONOSCOPY WITH PROPOFOL;  Surgeon: Garlan Fair, MD;  Location: [facility];  Service: Endoscopy;  Laterality: N/A;  . Flexible sigmoidoscopy N/A 04/26/2014    Procedure: FLEXIBLE SIGMOIDOSCOPY - UnSedated;  Surgeon: Garlan Fair, MD;  Location: [facility];  Service: Endoscopy;  Laterality: N/A;  . Esophageal dilation      For benign stricture in 2011  . Total abdominal hysterectomy w/ bilateral salpingoophorectomy      At age 89 due to miscarriage and retained products of conception causing significant uterine bleeding. Patient has been on estrogen replacement therapy since then.  . Flexible sigmoidoscopy N/A 01/24/2015    Procedure: FLEXIBLE SIGNMOIDOSCOPY W/ FLEET ENEMIA;  Surgeon: Garlan Fair, MD;  Location: [facility];  Service: Endoscopy;  Laterality: N/A;    SOCIAL HISTORY: Social History   Social History  . Marital Status: Married    Spouse Name: N/A  . Number of Children: N/A  . Years of Education: N/A   Occupational History  . Not on file.   Social History Main Topics  . Smoking status: Former Smoker    Quit date: 11/01/1983  . Smokeless tobacco:  Never Used  . Alcohol Use: Yes     Comment: occ  . Drug Use: No  . Sexual Activity: Not on file   Other Topics Concern  . Not on file   Social History Narrative  Retired as Conservation officer, nature for [employer]. She is very well spoken and intelligent individual and exhibits a keen knowledge of her medical conditions.  FAMILY HISTORY: Family History  Problem Relation Age of Onset  . Lung cancer Sister   . Prostate cancer Brother   . Uterine cancer Maternal Aunt   . Breast cancer Paternal Grandmother   . Hodgkin's lymphoma Sister     ALLERGIES:  is  allergic to nitrofurantoin; indomethacin; and lisinopril.  MEDICATIONS:  Current Outpatient Prescriptions  Medication Sig Dispense Refill  . acetaminophen (TYLENOL) 650 MG CR tablet Take 1,300 mg by mouth 2 (two) times daily.    Marland Kitchen amLODipine (NORVASC) 5 MG tablet Take 5 mg by mouth daily.    Marland Kitchen CANASA 1000 MG suppository Place 1,000 mg rectally daily as needed.   11  . COLCRYS 0.6 MG tablet TK 1 T PO  QD TO BID PRN ACUTE GOUT FLARE FOR 7 DAYS  1  . estradiol (ESTRACE) 0.5 MG tablet Take 0.5 mg by mouth daily.  1  . guaiFENesin (MUCINEX) 600 MG 12 hr tablet Take by mouth 2 (two) times daily.    Marland Kitchen HYDROcodone-acetaminophen (NORCO/VICODIN) 5-325 MG tablet Take 1-2 tablets by mouth every 4 (four) hours as needed for moderate pain or severe pain. 20 tablet 0  . mirtazapine (REMERON) 15 MG tablet TAKE ONE TABLET BY MOUTH AT BEDTIME 30 tablet 0  . ondansetron (ZOFRAN) 4 MG tablet Take 1 tablet (4 mg total) by mouth every 8 (eight) hours as needed for nausea or vomiting. 30 tablet 0  . OVER THE COUNTER MEDICATION Apply 1 application topically. [place] dream over the counter cream for arthritis    . oxyCODONE (OXY IR/ROXICODONE) 5 MG immediate release tablet Take 0.5-1 tablets (2.5-5 mg total) by mouth every 4 (four) hours as needed for moderate pain, severe pain or breakthrough pain. 60 tablet 0  . predniSONE (STERAPRED UNI-PAK 21 TAB) 10 MG (21) TBPK tablet   0  . PRESCRIPTION MEDICATION Chemo card    . Respiratory Therapy Supplies (FLUTTER) DEVI 1 each by Does not apply route daily. 1 each 0  . senna-docusate (SENNA S) 8.6-50 MG per tablet Take 2 tablets by mouth at bedtime. 60 tablet 1  . triamcinolone ointment (KENALOG) 0.5 % Apply 1 application topically 2 (two) times daily. (Patient taking differently: Apply 1 application topically 2 (two) times daily as needed (for rash). ) 30 g 0   No current facility-administered medications for this visit.   REVIEW OF SYSTEMS:    10 point review of  systems done and is negative except as noted above  PHYSICAL EXAMINATION: ECOG PERFORMANCE STATUS: 1  Filed Vitals:   10/14/15 1207  BP: 155/71  Pulse: 72  Temp: 97.9 F (36.6 C)  Resp: 18   Filed Weights   10/14/15 1207  Weight: 124 lb 12.8 oz (56.609 kg)   GENERAL elderly Caucasian female, alert, no distress and comfortable SKIN: resolved skin rashes EYES: normal, conjunctiva are pink and non-injected, sclera clear OROPHARYNX:no exudate, no erythema and lips, buccal mucosa, and tongue normal  NECK: supple, thyroid normal size, non-tender, without nodularity LYMPH:  no palpable lymphadenopathy in the cervical, axillary or inguinal LUNGS: few bibasilar rales, scattered rhonci, good air  entry. HEART: regular rate & rhythm and no murmurs and no lower extremity edema ABDOMEN:abdomen soft, non-tender and normal bowel sounds Musculoskeletal:no cyanosis of digits and no clubbing  PSYCH: alert & oriented x 3 with fluent speech NEURO: no focal motor/sensory deficits  LABORATORY DATA:  CBC Latest Ref Rng 10/14/2015 09/23/2015 09/02/2015  WBC 3.9 - 10.3 10e3/uL 5.4 6.8 5.0  Hemoglobin 11.6 - 15.9 g/dL 11.8 11.1(L) 11.7  Hematocrit 34.8 - 46.6 % 35.4 33.9(L) 35.6  Platelets 145 - 400 10e3/uL 276 310 294   . CMP Latest Ref Rng 10/14/2015 09/23/2015 09/02/2015  Glucose 70 - 140 mg/dl 75 69(L) 75  BUN 7.0 - 26.0 mg/dL 13.7 18.5 15.6  Creatinine 0.6 - 1.1 mg/dL 1.0 1.1 1.1  Sodium 136 - 145 mEq/L 141 139 140  Potassium 3.5 - 5.1 mEq/L 4.1 4.3 4.1  CO2 22 - 29 mEq/L 24 25 25   Calcium 8.4 - 10.4 mg/dL 9.1 9.7 9.6  Total Protein 6.4 - 8.3 g/dL 7.4 7.8 7.9  Total Bilirubin 0.20 - 1.20 mg/dL 0.49 0.37 0.55  Alkaline Phos 40 - 150 U/L 44 38(L) 37(L)  AST 5 - 34 U/L 22 16 17   ALT 0 - 55 U/L 13 <9 <9   ASSESSMENT & PLAN:   82 year old Caucasian female with  #1 Right Renal pelvis metastatic transitional cell carcinoma.  PET scan revealed metastases to the lungs, liver, multiple nodal  stations and bones. She is status post 13 cycles of Atezolizumab. PET scan done after 8 cycles of treatment for restaging disease showed significant response to treatment with no evidence of active disease. Patient has no clinical evidence of disease progression at this time. CT chest 06/16/2015 showed no evidence of cancer progression.  #2 h/o skin rash likely related to her Atezolizumab. Was grade 1 and is now resolved with topical triamcinolone. #3 Cough and some clear secretions likely due to bronchiectasis - evaluated by pulmonary. He has no progression of symptoms. No chest pain no increased shortness of breath or dyspnea on exertion . Plan  --Patient's labs are stable. --We'll continue Atezolizumab. -Continue Xgeva every 6 weeks -Continue triamcinolone ointment for her skin rash as needed. (Has not needed this for the last few months). -Continue mesalamine suppositories as per Dr. Hassell Done for ulcerative proctitis- using on an as needed basis and hasnt needed it recently. - repeat PET/CT scheduled prior to next cycle of treatment -She'll continue to follow Dr. 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  #4 history of hypertension   -on Amlodipine   RTC with Dr Irene Limbo in 3 weeks prior to next cycle of treatment with rpt labs and PET/CT scan. Earlier followup if any new concerns.   Sullivan Lone MD [facility]  (Office):       [phone] (Work cell):  [phone] (Fax):           [phone]

## 2015-10-18 ENCOUNTER — Ambulatory Visit (INDEPENDENT_AMBULATORY_CARE_PROVIDER_SITE_OTHER): Payer: Medicare Other | Admitting: Internal Medicine

## 2015-10-18 ENCOUNTER — Encounter: Payer: Self-pay | Admitting: Internal Medicine

## 2015-10-18 VITALS — BP 140/76 | HR 73 | Ht 61.0 in | Wt 123.0 lb

## 2015-10-18 DIAGNOSIS — J479 Bronchiectasis, uncomplicated: Secondary | ICD-10-CM | POA: Diagnosis not present

## 2015-10-18 MED ORDER — AZITHROMYCIN 250 MG PO TABS: ORAL_TABLET | ORAL | Status: DC

## 2015-10-18 NOTE — Progress Notes (Signed)
Subjective:    Patient ID: Tiffany [name], female      DOB: 03/09/1925     MRN: [id number]    Brief patient profile:  27 yowf quit smoking in 1985 no prev resp dx referred 07/06/2015 by Dr Irene Limbo to [facility] for cough with abn ct c/w bronchiectasis/MAI on 06/16/15     History of Present Illness  07/06/2015 1st [facility] office visit/ Tiffany [name]   Chief Complaint  Patient presents with  . pulmonary consult    pt ref by dr Irene Limbo for cough. pt c/o prod cough tan in color X6-8wk  insidious onset persistent daily cough x 6-8 weeks worse at hs and early in am and mucus brown in am and lighter as day goes on Already rx with zpak, vantin - was precribed avelox but never started it. rec Levaquin 500 mg daily x 10 days- stop it if develop aches in your tendons Prednisone 10 mg take  4 each am x 2 days,   2 each am x 2 days,  1 each am x 2 days and stop  mucinex 1200 mg every 12 hours as needed for cough and congestion and use the flutter valve as much as possible     07/19/2015  f/u ov/Tiffany [name] re: bronchiectasis / probable MAI  Chief Complaint  Patient presents with  . Follow-up    CXR done today. Her cough has improved some. Still coughing up some light brown sputum.   Not limited by breathing from desired activities   Cough is mostly daytime  rec  mucinex 1200 mg up to every 12 hours as needed for cough and congestion and use the flutter valve as much as possible   10/18/2015  f/u ov/Tiffany [name] re: bronchiectasis ? MAI  rx flutter only, no pulmonary meds  Chief Complaint  Patient presents with  . Follow-up    Cough is doing well. She was on pred recently for gout and feels that this made a big difference.   medrol for gout helped the cough   No obvious day to day or daytime variability or assoc sob  Or excess/ purulent sputum or mucus plugs   cp or chest tightness, subjective wheeze or overt sinus or hb symptoms. No unusual exp hx or h/o childhood pna/ asthma or knowledge of premature  birth.  Sleeping ok without nocturnal  or early am exacerbation  of respiratory  c/o's or need for noct saba. Also denies any obvious fluctuation of symptoms with weather or environmental changes or other aggravating or alleviating factors except as outlined above   Current Medications, Allergies, Complete Past Medical History, Past Surgical History, Family History, and Social History were reviewed in [employer] record.  ROS  The following are not active complaints unless bolded sore throat, dysphagia, dental problems, itching, sneezing,  nasal congestion or excess/ purulent secretions, ear ache,   fever, chills, sweats, unintended wt loss, classically pleuritic or exertional cp, hemoptysis,  orthopnea pnd or leg swelling, presyncope, palpitations, abdominal pain, anorexia, nausea, vomiting, diarrhea  or change in bowel or bladder habits, change in stools or urine, dysuria,hematuria,  rash, arthralgias, visual complaints, headache, numbness, weakness or ataxia or problems with walking or coordination,  change in mood/affect or memory.        .                Objective:   Physical Exam   Pleasant wf nad/minimal rattling cough   10/18/2015  123 ,07/19/2015       127   07/06/15 126 lb 12.8 oz (57.516 kg)  07/01/15 125 lb 8 oz (56.926 kg)  06/10/15 125 lb (56.7 kg)    Vital signs reviewed  HEENT: nl dentition, turbinates, and oropharynx. Nl external ear canals without cough reflex   NECK :  without JVD/Nodes/TM/ nl carotid upstrokes bilaterally   LUNGS: no acc muscle use,  Nl contour chest with very minimal insp and exp rhonchi and insp crackles in both bases    CV:  RRR  no s3 or murmur or increase in P2, no edema   ABD:  soft and nontender with nl inspiratory excursion in the supine position. No bruits or organomegaly, bowel sounds nl  MS:  Nl gait/ ext warm without deformities, calf tenderness, cyanosis or clubbing No obvious joint restrictions    SKIN: warm and dry without lesions    NEURO:  alert, approp, nl sensorium with  no motor deficits         Assessment & Plan:

## 2015-10-18 NOTE — Patient Instructions (Addendum)
For cough >>  mucinex 1200 mg up to every 12 hours as needed for cough and congestion and use the flutter valve as much as possible   If mucus gets nasty > zpak x 5 day cycle     Be sure you've had the Prevnar vaccine - if not from [facility] then from your previous doctor    Please schedule a follow up visit in 3 months but call sooner if needed

## 2015-10-19 NOTE — Assessment & Plan Note (Addendum)
CT  06/16/15 c/w bronchiectasis/ MAI >Levaquin 500 mg one daily x 10 days started  07/06/2015 Plus flutter > much better 07/19/2015  - Zpak cycles 10/18/2015 >>>   I had an extended discussion with the patient reviewing all relevant studies completed to date and  lasting 15 to 20 minutes of a 25 minute visit on the following ongoing concerns:   Again I reviewed the pathophysiology of bronchiectasis using the analogy of a broken escalator to help her understand her problem is related to poor mucus clearance and that she can help the problem by using the flutter valve. In addition, I recommended short courses of Zithromax should she have purulent sputum but do not recommend any further steroids systemic or otherwise at this point (not really clear to me why Medrol for  gout would've helped her cough or her breathing in the absence of an asthmatic component that is not apparent clinically and the risks of using steroids chronically here far outweigh the benefits)  Strongly recommend that she keep up with flu vaccination and appropriate vaccination against pneumococcal infection which should include a Prevnar 13 at this point > Follow up per Primary Care planned     Each maintenance medication was reviewed in detail including most importantly the difference between maintenance and as needed and under what circumstances the prns are to be used.  Please see instructions for details which were reviewed in writing and the patient given a copy.

## 2015-10-21 ENCOUNTER — Ambulatory Visit (HOSPITAL_COMMUNITY)
Admission: RE | Admit: 2015-10-21 | Discharge: 2015-10-21 | Disposition: A | Discharge status: Home / Self Care | Payer: Medicare Other | Source: Ambulatory Visit | Attending: Hematology | Admitting: Hematology

## 2015-10-21 DIAGNOSIS — C651 Malignant neoplasm of right renal pelvis: Secondary | ICD-10-CM | POA: Insufficient documentation

## 2015-10-21 DIAGNOSIS — R93421 Abnormal radiologic findings on diagnostic imaging of right kidney: Secondary | ICD-10-CM | POA: Insufficient documentation

## 2015-10-21 DIAGNOSIS — C679 Malignant neoplasm of bladder, unspecified: Secondary | ICD-10-CM | POA: Diagnosis not present

## 2015-10-21 LAB — GLUCOSE, CAPILLARY: GLUCOSE-CAPILLARY: 81 mg/dL (ref 65–99)

## 2015-10-21 MED ORDER — FLUDEOXYGLUCOSE F - 18 (FDG) INJECTION
6.4100 | Freq: Once | INTRAVENOUS | Status: AC | PRN
  Administered 2015-10-21: 6.41 via INTRAVENOUS

## 2015-10-31 DIAGNOSIS — E2839 Other primary ovarian failure: Secondary | ICD-10-CM | POA: Diagnosis not present

## 2015-10-31 DIAGNOSIS — C689 Malignant neoplasm of urinary organ, unspecified: Secondary | ICD-10-CM | POA: Diagnosis not present

## 2015-10-31 DIAGNOSIS — I10 Essential (primary) hypertension: Secondary | ICD-10-CM | POA: Diagnosis not present

## 2015-11-04 ENCOUNTER — Ambulatory Visit (HOSPITAL_BASED_OUTPATIENT_CLINIC_OR_DEPARTMENT_OTHER): Payer: Medicare Other | Admitting: Hematology

## 2015-11-04 ENCOUNTER — Other Ambulatory Visit: Payer: Self-pay | Admitting: *Deleted

## 2015-11-04 ENCOUNTER — Other Ambulatory Visit (HOSPITAL_BASED_OUTPATIENT_CLINIC_OR_DEPARTMENT_OTHER): Payer: Medicare Other

## 2015-11-04 ENCOUNTER — Ambulatory Visit: Payer: Medicare Other

## 2015-11-04 ENCOUNTER — Telehealth: Payer: Self-pay | Admitting: Hematology

## 2015-11-04 ENCOUNTER — Ambulatory Visit (HOSPITAL_BASED_OUTPATIENT_CLINIC_OR_DEPARTMENT_OTHER): Payer: Medicare Other

## 2015-11-04 ENCOUNTER — Encounter: Payer: Self-pay | Admitting: Hematology

## 2015-11-04 ENCOUNTER — Other Ambulatory Visit (HOSPITAL_COMMUNITY)
Admission: RE | Admit: 2015-11-04 | Discharge: 2015-11-04 | Disposition: A | Discharge status: Home / Self Care | Payer: Medicare Other | Source: Ambulatory Visit | Attending: Hematology | Admitting: Hematology

## 2015-11-04 VITALS — BP 142/68 | HR 72 | Temp 98.3°F | Resp 18 | Ht 61.0 in | Wt 127.1 lb

## 2015-11-04 DIAGNOSIS — C659 Malignant neoplasm of unspecified renal pelvis: Secondary | ICD-10-CM

## 2015-11-04 DIAGNOSIS — C651 Malignant neoplasm of right renal pelvis: Secondary | ICD-10-CM

## 2015-11-04 DIAGNOSIS — C7800 Secondary malignant neoplasm of unspecified lung: Secondary | ICD-10-CM | POA: Diagnosis not present

## 2015-11-04 DIAGNOSIS — Z5112 Encounter for antineoplastic immunotherapy: Secondary | ICD-10-CM

## 2015-11-04 DIAGNOSIS — C7951 Secondary malignant neoplasm of bone: Secondary | ICD-10-CM | POA: Diagnosis not present

## 2015-11-04 DIAGNOSIS — I10 Essential (primary) hypertension: Secondary | ICD-10-CM

## 2015-11-04 DIAGNOSIS — C787 Secondary malignant neoplasm of liver and intrahepatic bile duct: Secondary | ICD-10-CM | POA: Diagnosis not present

## 2015-11-04 DIAGNOSIS — C778 Secondary and unspecified malignant neoplasm of lymph nodes of multiple regions: Secondary | ICD-10-CM

## 2015-11-04 DIAGNOSIS — Z95828 Presence of other vascular implants and grafts: Secondary | ICD-10-CM

## 2015-11-04 LAB — COMPREHENSIVE METABOLIC PANEL
ALT: <9 U/L (ref 0–55)
ANION GAP: 9 meq/L (ref 3–11)
AST: 16 U/L (ref 5–34)
Albumin: 3.3 g/dL — ABNORMAL LOW (ref 3.5–5.0)
Alkaline Phosphatase: 42 U/L (ref 40–150)
BUN: 13.8 mg/dL (ref 7.0–26.0)
CALCIUM: 9.3 mg/dL (ref 8.4–10.4)
CHLORIDE: 107 meq/L (ref 98–109)
CO2: 24 meq/L (ref 22–29)
CREATININE: 1 mg/dL (ref 0.6–1.1)
EGFR: 49 mL/min/{1.73_m2} — AB (ref >90)
Glucose: 87 mg/dl (ref 70–140)
POTASSIUM: 4.1 meq/L (ref 3.5–5.1)
Sodium: 140 mEq/L (ref 136–145)
Total Bilirubin: 0.33 mg/dL (ref 0.20–1.20)
Total Protein: 7 g/dL (ref 6.4–8.3)

## 2015-11-04 LAB — URINALYSIS, MICROSCOPIC - CHCC
BILIRUBIN (URINE): NEGATIVE
BLOOD: NEGATIVE
Glucose: NEGATIVE mg/dL
KETONES: NEGATIVE mg/dL
Nitrite: POSITIVE
PH: 6.5 (ref 4.6–8.0)
Protein: NEGATIVE mg/dL
SPECIFIC GRAVITY, URINE: 1.005 (ref 1.003–1.035)
Urobilinogen, UR: 0.2 mg/dL (ref 0.2–1)

## 2015-11-04 LAB — CBC WITH DIFFERENTIAL/PLATELET
BASO%: 0.4 % (ref 0.0–2.0)
BASOS ABS: 0 10*3/uL (ref 0.0–0.1)
EOS ABS: 0.1 10*3/uL (ref 0.0–0.5)
EOS%: 2.1 % (ref 0.0–7.0)
HEMATOCRIT: 33.8 % — AB (ref 34.8–46.6)
HGB: 11.2 g/dL — ABNORMAL LOW (ref 11.6–15.9)
LYMPH#: 1.7 10*3/uL (ref 0.9–3.3)
LYMPH%: 27.6 % (ref 14.0–49.7)
MCH: 31.1 pg (ref 25.1–34.0)
MCHC: 33.1 g/dL (ref 31.5–36.0)
MCV: 93.9 fL (ref 79.5–101.0)
MONO#: 1.2 10*3/uL — AB (ref 0.1–0.9)
MONO%: 20.1 % — ABNORMAL HIGH (ref 0.0–14.0)
NEUT#: 3 10*3/uL (ref 1.5–6.5)
NEUT%: 49.8 % (ref 38.4–76.8)
PLATELETS: 324 10*3/uL (ref 145–400)
RBC: 3.59 10*6/uL — AB (ref 3.70–5.45)
RDW: 15.4 % — ABNORMAL HIGH (ref 11.2–14.5)
WBC: 6.1 10*3/uL (ref 3.9–10.3)

## 2015-11-04 MED ORDER — SODIUM CHLORIDE 0.9 % IJ SOLN
10.0000 mL | INTRAMUSCULAR | Status: DC | PRN
  Administered 2015-11-04: 10 mL via INTRAVENOUS
  Filled 2015-11-04: qty 10

## 2015-11-04 MED ORDER — SODIUM CHLORIDE 0.9 % IJ SOLN
10.0000 mL | INTRAMUSCULAR | Status: DC | PRN
  Administered 2015-11-04: 10 mL
  Filled 2015-11-04: qty 10

## 2015-11-04 MED ORDER — MIRTAZAPINE 15 MG PO TABS: 15.0000 mg | ORAL_TABLET | Freq: Every day | ORAL | 0 refills | Status: DC

## 2015-11-04 MED ORDER — HEPARIN SOD (PORK) LOCK FLUSH 100 UNIT/ML IV SOLN
500.0000 [IU] | Freq: Once | INTRAVENOUS | Status: AC | PRN
  Administered 2015-11-04: 500 [IU]
  Filled 2015-11-04: qty 5

## 2015-11-04 MED ORDER — SODIUM CHLORIDE 0.9 % IV SOLN
1200.0000 mg | Freq: Once | INTRAVENOUS | Status: AC
  Administered 2015-11-04: 1200 mg via INTRAVENOUS
  Filled 2015-11-04: qty 20

## 2015-11-04 MED ORDER — SODIUM CHLORIDE 0.9 % IV SOLN
Freq: Once | INTRAVENOUS | Status: AC
  Administered 2015-11-04: 11:00:00 via INTRAVENOUS

## 2015-11-04 NOTE — Patient Instructions (Signed)
[facility] Discharge Instructions for Patients Receiving Chemotherapy  Today you received the following chemotherapy agents Tecentriq  To help prevent nausea and vomiting after your treatment, we encourage you to take your nausea medication   If you develop nausea and vomiting that is not controlled by your nausea medication, call the clinic.   BELOW ARE SYMPTOMS THAT SHOULD BE REPORTED IMMEDIATELY:  *FEVER GREATER THAN 100.5 F  *CHILLS WITH OR WITHOUT FEVER  NAUSEA AND VOMITING THAT IS NOT CONTROLLED WITH YOUR NAUSEA MEDICATION  *UNUSUAL SHORTNESS OF BREATH  *UNUSUAL BRUISING OR BLEEDING  TENDERNESS IN MOUTH AND THROAT WITH OR WITHOUT PRESENCE OF ULCERS  *URINARY PROBLEMS  *BOWEL PROBLEMS  UNUSUAL RASH Items with * indicate a potential emergency and should be followed up as soon as possible.  Feel free to call the clinic you have any questions or concerns. The clinic phone number is [phone].  Please show the CHEMO ALERT CARD at check-in to the Emergency Department and triage nurse.   

## 2015-11-04 NOTE — Telephone Encounter (Signed)
left msg confirming 8/25 apt times, made an apt w/ [facility] 9/8 @ 2:00, spoke with triage and they are trying to get an earlier apt.

## 2015-11-06 NOTE — Progress Notes (Signed)
Hematology oncology clinic follow-up.  Date of service. 11/04/2015  Patient Care Team: [name], MD as PCP - General (Internal Medicine)   Urologist: Dr. 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  Diagnosis:  Widely metastatic transitional cell carcinoma from the right renal pelvis.  Treatment -Atezolizumab IV q3weeks status post 15 cycles here for her 16th cycle.  HISTORY OF PRESENTING ILLNESS: Please see my previous notes for details of initial presentation.  INTERVAL HISTORY  Tiffany [name] is here for her scheduled follow-up prior to her next cycle of Atezolizumab. She notes minimal rectal bleeding without significant diarrhea. Has been seen by Dr. Hassell Done her gastroenterologist and continues to be on 5 ASA suppositories with good effect. Breathing is stable. Minimal grade 1 rash that is resolving with topical steroids. Repeat PET CT scan showed no evidence of progression/recurrence of her metastatic disease however that as of right area in her right renal pelvis which needs additional attention.  MEDICAL HISTORY:  Past Medical History:  Diagnosis Date  . Arthritis     knees 03-10-12 had Cortisone injection  . Cancer [facility]) june 2016   metastatic  . DDD (degenerative disc disease), cervical   . Edema leg    feet and ankles  . Esophagus disorder    Had esophagus stretched  . GERD (gastroesophageal reflux disease)    Benign stricture dilated in 2011  . Gout    Patient notes she has possible gout but has not been on chronic medications for this  . Headache(784.0)   . Hypertension   . Occipital neuralgia   . Occipital neuralgia    Related to cervical degenerative disc disease  . Peptic ulcer disease    Previous history in the remote past  . Ulcerative proctitis ([place])   . Ulcerative proctitis ([place]) 2014    SURGICAL  HISTORY: Past Surgical History:  Procedure Laterality Date  . ABDOMINAL HYSTERECTOMY    . APPENDECTOMY    . CHOLECYSTECTOMY    . COLONOSCOPY WITH PROPOFOL  03/25/2012   Procedure: COLONOSCOPY WITH PROPOFOL;  Surgeon: Garlan Fair, MD;  Location: [facility];  Service: Endoscopy;  Laterality: N/A;  . DILATION AND CURETTAGE OF UTERUS    . ESOPHAGEAL DILATION     For benign stricture in 2011  . ESOPHAGOGASTRODUODENOSCOPY    . FLEXIBLE SIGMOIDOSCOPY N/A 04/26/2014   Procedure: FLEXIBLE SIGMOIDOSCOPY - UnSedated;  Surgeon: Garlan Fair, MD;  Location: [facility];  Service: Endoscopy;  Laterality: N/A;  . FLEXIBLE SIGMOIDOSCOPY N/A 01/24/2015   Procedure: FLEXIBLE SIGNMOIDOSCOPY W/ FLEET ENEMIA;  Surgeon: Garlan Fair, MD;  Location: [facility];  Service: Endoscopy;  Laterality: N/A;  . NECK SURGERY     20 years ago  . TOTAL ABDOMINAL HYSTERECTOMY W/ BILATERAL SALPINGOOPHORECTOMY     At age 57 due to miscarriage and retained products of conception causing significant uterine bleeding. Patient has been on estrogen replacement therapy since then.    SOCIAL HISTORY: Social History   Social History  . Marital status: Married    Spouse name: N/A  . Number of children: N/A  . Years of education: N/A   Occupational History  . Not on file.   Social History Main Topics  . Smoking status: Former Smoker    Quit date: 11/01/1983  . Smokeless tobacco: Never Used  . Alcohol use Yes     Comment: occ  . Drug  use: No  . Sexual activity: Not on file   Other Topics Concern  . Not on file   Social History Narrative  . No narrative on file  Retired as Conservation officer, nature for [employer]. She is very well spoken and intelligent individual and exhibits a keen knowledge of her medical conditions.  FAMILY HISTORY: Family History  Problem Relation Age of Onset  . Lung cancer Sister   . Prostate cancer Brother   . Uterine cancer Maternal Aunt   . Breast cancer  Paternal Grandmother   . Hodgkin's lymphoma Sister     ALLERGIES:  is allergic to nitrofurantoin; indomethacin; and lisinopril.  MEDICATIONS:  Current Outpatient Prescriptions  Medication Sig Dispense Refill  . acetaminophen (TYLENOL) 650 MG CR tablet Take 1,300 mg by mouth 2 (two) times daily.    Marland Kitchen amLODipine (NORVASC) 5 MG tablet Take 5 mg by mouth daily.    Marland Kitchen azithromycin (ZITHROMAX) 250 MG tablet Take 2 on day one then 1 daily x 4 days 6 tablet 11  . CANASA 1000 MG suppository Place 1,000 mg rectally daily as needed.   11  . COLCRYS 0.6 MG tablet TK 1 T PO  QD TO BID PRN ACUTE GOUT FLARE FOR 7 DAYS  1  . estradiol (ESTRACE) 0.5 MG tablet Take 0.5 mg by mouth daily.  1  . guaiFENesin (MUCINEX) 600 MG 12 hr tablet Take by mouth 2 (two) times daily.    Marland Kitchen HYDROcodone-acetaminophen (NORCO/VICODIN) 5-325 MG tablet Take 1-2 tablets by mouth every 4 (four) hours as needed for moderate pain or severe pain. 20 tablet 0  . ondansetron (ZOFRAN) 4 MG tablet Take 1 tablet (4 mg total) by mouth every 8 (eight) hours as needed for nausea or vomiting. 30 tablet 0  . OVER THE COUNTER MEDICATION Apply 1 application topically. [place] dream over the counter cream for arthritis    . oxyCODONE (OXY IR/ROXICODONE) 5 MG immediate release tablet Take 0.5-1 tablets (2.5-5 mg total) by mouth every 4 (four) hours as needed for moderate pain, severe pain or breakthrough pain. 60 tablet 0  . PRESCRIPTION MEDICATION Chemo card    . Respiratory Therapy Supplies (FLUTTER) DEVI 1 each by Does not apply route daily. 1 each 0  . senna-docusate (SENNA S) 8.6-50 MG per tablet Take 2 tablets by mouth at bedtime. 60 tablet 1  . triamcinolone ointment (KENALOG) 0.5 % Apply 1 application topically 2 (two) times daily. (Patient taking differently: Apply 1 application topically 2 (two) times daily as needed (for rash). ) 30 g 0  . mirtazapine (REMERON) 15 MG tablet Take 1 tablet (15 mg total) by mouth at bedtime. 30 tablet 0    No current facility-administered medications for this visit.    REVIEW OF SYSTEMS:    10 point review of systems done and is negative except as noted above  PHYSICAL EXAMINATION: ECOG PERFORMANCE STATUS: 1  Vitals:   11/04/15 0957  BP: (!) 142/68  Pulse: 72  Resp: 18  Temp: 98.3 F (36.8 C)   Filed Weights   11/04/15 0957  Weight: 127 lb 1.6 oz (57.7 kg)   GENERAL elderly Caucasian female, alert, no distress and comfortable SKIN: resolved skin rashes EYES: normal, conjunctiva are pink and non-injected, sclera clear OROPHARYNX:no exudate, no erythema and lips, buccal mucosa, and tongue normal  NECK: supple, thyroid normal size, non-tender, without nodularity LYMPH:  no palpable lymphadenopathy in the cervical, axillary or inguinal LUNGS: few bibasilar rales, scattered rhonci, good air  entry. HEART: regular rate & rhythm and no murmurs and no lower extremity edema ABDOMEN:abdomen soft, non-tender and normal bowel sounds Musculoskeletal:no cyanosis of digits and no clubbing  PSYCH: alert & oriented x 3 with fluent speech NEURO: no focal motor/sensory deficits  LABORATORY DATA:  CBC Latest Ref Rng & Units 11/04/2015 10/14/2015 09/23/2015  WBC 3.9 - 10.3 10e3/uL 6.1 5.4 6.8  Hemoglobin 11.6 - 15.9 g/dL 11.2(L) 11.8 11.1(L)  Hematocrit 34.8 - 46.6 % 33.8(L) 35.4 33.9(L)  Platelets 145 - 400 10e3/uL 324 276 310   . CMP Latest Ref Rng & Units 11/04/2015 10/14/2015 09/23/2015  Glucose 70 - 140 mg/dl 87 75 69(L)  BUN 7.0 - 26.0 mg/dL 13.8 13.7 18.5  Creatinine 0.6 - 1.1 mg/dL 1.0 1.0 1.1  Sodium 136 - 145 mEq/L 140 141 139  Potassium 3.5 - 5.1 mEq/L 4.1 4.1 4.3  Chloride 101 - 111 mmol/L - - -  CO2 22 - 29 mEq/L 24 24 25   Calcium 8.4 - 10.4 mg/dL 9.3 9.1 9.7  Total Protein 6.4 - 8.3 g/dL 7.0 7.4 7.8  Total Bilirubin 0.20 - 1.20 mg/dL 0.33 0.49 0.37  Alkaline Phos 40 - 150 U/L 42 44 38(L)  AST 5 - 34 U/L 16 22 16   ALT 0 - 55 U/L <9 13 <9   Radiology .Nm Pet Image Restag  (ps) Skull Base To Thigh  Result Date: 10/21/2015 CLINICAL DATA:  Subsequent treatment strategy for transitional cell carcinoma. EXAM: NUCLEAR MEDICINE PET SKULL BASE TO THIGH TECHNIQUE: 6.4 mCi F-18 FDG was injected intravenously. Full-ring PET imaging was performed from the skull base to thigh after the radiotracer. CT data was obtained and used for attenuation correction and anatomic localization. FASTING BLOOD GLUCOSE:  Value: 81 mg/dl COMPARISON:  Multiple prior PET CTs.  The most recent is 04/27/2015. FINDINGS: NECK No hypermetabolic or enlarged neck nodes are identified. The right IJ Port-A-Cath the. CHEST No enlarged or hypermetabolic mediastinal or hilar lymph nodes. No hypermetabolic pulmonary lesions to suggest metastatic disease. Stable moderate peribronchial thickening both lower lung zones but no infiltrates or effusions. Stable extensive atherosclerotic calcifications involving aorta and coronary arteries. ABDOMEN/PELVIS New marked hypermetabolism along the right renal pelvis. SUV max is 40.0. This could be due to new hydronephrosis but the Hounsfield units measure 28.75. This could be recurrent tumor or complex hydronephrosis with some proteinaceous debris or hemorrhage. MRI is recommended for further assessment. No enlarged or hypermetabolic mesenteric or retroperitoneal lymph nodes to suggest metastatic disease. No hepatic or adrenal lesions. Stable advanced atherosclerotic calcifications involving the aorta iliac arteries. No focal aneurysm. No pelvic adenopathy. No gross bladder abnormality. No inguinal mass. SKELETON No focal hypermetabolic activity to suggest skeletal metastasis. IMPRESSION: 1. No findings for metastatic disease involving the chest. Persistent but improved bibasilar peribronchial disease. 2. New right-sided hydronephrosis versus recurrent collecting system tumor, or both. No right-sided hydroureteronephrosis or obstructing ureteral calculus. MRI may be helpful for further  evaluation. 3. No findings for metastatic disease involving abdomen/pelvis or osseous structures. Electronically Signed   By: Marijo Sanes M.D.   On: 10/21/2015 10:17    ASSESSMENT & PLAN:   82 year old Caucasian female with  #1 Right Renal pelvis metastatic transitional cell carcinoma.  PET scan revealed metastases to the lungs, liver, multiple nodal stations and bones. She is status post 13 cycles of Atezolizumab. PET scan done after 8 cycles of treatment for restaging disease showed significant response to treatment with no evidence of active disease. Patient has no clinical evidence of  disease progression at this time. CT chest 06/16/2015 showed no evidence of cancer progression. PET/CT scan done on 10/21/2015- showed no findings for metastatic disease. New right-sided hydronephrosis versus local tumor recurrence needs to be evaluated further. #2 h/o skin rash likely related to her Atezolizumab. Minimal grade 1 and topical triamcinolone . #3 Cough and some clear secretions likely due to bronchiectasis - stable symptoms. No chest pain no increased shortness of breath or dyspnea on exertion . #4 minimal rectal bleeding due to inflammatory proctitis on 5-ASA suppositories as per Dr. Hassell Done. Plan  --PET CT scan results discussed in details with the patient --Patient's labs are stable. --We'll continue Atezolizumab. -Continue Xgeva every 6 weeks -Continue triamcinolone ointment for her skin rash as needed. (Has not needed this for the last few months). -Continue mesalamine suppositories as per Dr. Hassell Done for ulcerative proctitis- using on an as needed basis and hasnt needed it recently. -MRI of the abdomen with and without contrast to evaluate the right kidney for possible local recurrence of transitional cell carcinoma versus new hydronephrosis. -Given follow-up with Dr. Matilde Sprang from urology who has seen her previously to evaluate for local tumor recurrence.  #4 history of hypertension     -on Amlodipine   RTC with Dr Irene Limbo in 3 weeks prior to next cycle of treatment with rpt labs, MRI of the abdomen and urology input. Earlier followup if any new concerns.   Sullivan Lone MD [place] [facility]  (Office):       [phone] (Work cell):  [phone] (Fax):           [phone]

## 2015-11-07 LAB — URINE CULTURE

## 2015-11-08 ENCOUNTER — Telehealth: Payer: Self-pay | Admitting: Hematology

## 2015-11-08 LAB — CYTOLOGY, URINE

## 2015-11-08 NOTE — Telephone Encounter (Signed)
Faxed pt medical records to [facility] °

## 2015-11-10 DIAGNOSIS — C787 Secondary malignant neoplasm of liver and intrahepatic bile duct: Secondary | ICD-10-CM | POA: Diagnosis not present

## 2015-11-10 DIAGNOSIS — C7802 Secondary malignant neoplasm of left lung: Secondary | ICD-10-CM | POA: Diagnosis not present

## 2015-11-10 DIAGNOSIS — R350 Frequency of micturition: Secondary | ICD-10-CM | POA: Diagnosis not present

## 2015-11-10 DIAGNOSIS — C7801 Secondary malignant neoplasm of right lung: Secondary | ICD-10-CM | POA: Diagnosis not present

## 2015-11-10 DIAGNOSIS — C778 Secondary and unspecified malignant neoplasm of lymph nodes of multiple regions: Secondary | ICD-10-CM | POA: Diagnosis not present

## 2015-11-11 ENCOUNTER — Telehealth: Payer: Self-pay | Admitting: *Deleted

## 2015-11-11 NOTE — Telephone Encounter (Signed)
No note

## 2015-11-16 ENCOUNTER — Ambulatory Visit (HOSPITAL_COMMUNITY)
Admission: RE | Admit: 2015-11-16 | Discharge: 2015-11-16 | Disposition: A | Discharge status: Home / Self Care | Payer: Medicare Other | Source: Ambulatory Visit | Attending: Hematology | Admitting: Hematology

## 2015-11-16 DIAGNOSIS — I700 Atherosclerosis of aorta: Secondary | ICD-10-CM | POA: Diagnosis not present

## 2015-11-16 DIAGNOSIS — C651 Malignant neoplasm of right renal pelvis: Secondary | ICD-10-CM | POA: Diagnosis not present

## 2015-11-16 DIAGNOSIS — K769 Liver disease, unspecified: Secondary | ICD-10-CM | POA: Insufficient documentation

## 2015-11-16 MED ORDER — GADOBENATE DIMEGLUMINE 529 MG/ML IV SOLN
15.0000 mL | Freq: Once | INTRAVENOUS | Status: AC | PRN
  Administered 2015-11-16: 11 mL via INTRAVENOUS

## 2015-11-24 ENCOUNTER — Other Ambulatory Visit: Payer: Self-pay | Admitting: *Deleted

## 2015-11-24 DIAGNOSIS — C787 Secondary malignant neoplasm of liver and intrahepatic bile duct: Secondary | ICD-10-CM

## 2015-11-25 ENCOUNTER — Other Ambulatory Visit (HOSPITAL_BASED_OUTPATIENT_CLINIC_OR_DEPARTMENT_OTHER): Payer: Medicare Other

## 2015-11-25 ENCOUNTER — Ambulatory Visit (HOSPITAL_BASED_OUTPATIENT_CLINIC_OR_DEPARTMENT_OTHER): Payer: Medicare Other | Admitting: Hematology

## 2015-11-25 ENCOUNTER — Ambulatory Visit (HOSPITAL_BASED_OUTPATIENT_CLINIC_OR_DEPARTMENT_OTHER): Payer: Medicare Other

## 2015-11-25 ENCOUNTER — Encounter: Payer: Self-pay | Admitting: Hematology

## 2015-11-25 ENCOUNTER — Telehealth: Payer: Self-pay | Admitting: Hematology

## 2015-11-25 ENCOUNTER — Ambulatory Visit: Payer: Medicare Other

## 2015-11-25 VITALS — BP 136/64 | HR 86 | Temp 98.5°F | Resp 18 | Ht 61.0 in | Wt 128.5 lb

## 2015-11-25 DIAGNOSIS — I10 Essential (primary) hypertension: Secondary | ICD-10-CM

## 2015-11-25 DIAGNOSIS — C7951 Secondary malignant neoplasm of bone: Secondary | ICD-10-CM

## 2015-11-25 DIAGNOSIS — C7800 Secondary malignant neoplasm of unspecified lung: Secondary | ICD-10-CM | POA: Diagnosis not present

## 2015-11-25 DIAGNOSIS — Z79899 Other long term (current) drug therapy: Secondary | ICD-10-CM | POA: Diagnosis not present

## 2015-11-25 DIAGNOSIS — C778 Secondary and unspecified malignant neoplasm of lymph nodes of multiple regions: Secondary | ICD-10-CM

## 2015-11-25 DIAGNOSIS — C659 Malignant neoplasm of unspecified renal pelvis: Secondary | ICD-10-CM

## 2015-11-25 DIAGNOSIS — C787 Secondary malignant neoplasm of liver and intrahepatic bile duct: Secondary | ICD-10-CM

## 2015-11-25 DIAGNOSIS — Z5112 Encounter for antineoplastic immunotherapy: Secondary | ICD-10-CM | POA: Diagnosis not present

## 2015-11-25 DIAGNOSIS — C651 Malignant neoplasm of right renal pelvis: Secondary | ICD-10-CM

## 2015-11-25 DIAGNOSIS — Z95828 Presence of other vascular implants and grafts: Secondary | ICD-10-CM

## 2015-11-25 LAB — COMPREHENSIVE METABOLIC PANEL
ALBUMIN: 3.1 g/dL — AB (ref 3.5–5.0)
ALK PHOS: 44 U/L (ref 40–150)
ALT: <9 U/L (ref 0–55)
ANION GAP: 9 meq/L (ref 3–11)
AST: 14 U/L (ref 5–34)
BILIRUBIN TOTAL: 0.35 mg/dL (ref 0.20–1.20)
BUN: 17 mg/dL (ref 7.0–26.0)
CALCIUM: 9.2 mg/dL (ref 8.4–10.4)
CO2: 25 mEq/L (ref 22–29)
Chloride: 106 mEq/L (ref 98–109)
Creatinine: 1 mg/dL (ref 0.6–1.1)
EGFR: 49 mL/min/{1.73_m2} — AB (ref >90)
GLUCOSE: 127 mg/dL (ref 70–140)
POTASSIUM: 4.1 meq/L (ref 3.5–5.1)
SODIUM: 140 meq/L (ref 136–145)
TOTAL PROTEIN: 7.1 g/dL (ref 6.4–8.3)

## 2015-11-25 LAB — CBC & DIFF AND RETIC
BASO%: 0.4 % (ref 0.0–2.0)
Basophils Absolute: 0 10*3/uL (ref 0.0–0.1)
EOS ABS: 0.3 10*3/uL (ref 0.0–0.5)
EOS%: 5.4 % (ref 0.0–7.0)
HCT: 32.1 % — ABNORMAL LOW (ref 34.8–46.6)
HEMOGLOBIN: 10.4 g/dL — AB (ref 11.6–15.9)
IMMATURE RETIC FRACT: 9.2 % (ref 1.60–10.00)
LYMPH#: 1.4 10*3/uL (ref 0.9–3.3)
LYMPH%: 29.4 % (ref 14.0–49.7)
MCH: 31.3 pg (ref 25.1–34.0)
MCHC: 32.4 g/dL (ref 31.5–36.0)
MCV: 96.7 fL (ref 79.5–101.0)
MONO#: 1 10*3/uL — AB (ref 0.1–0.9)
MONO%: 21.2 % — ABNORMAL HIGH (ref 0.0–14.0)
NEUT#: 2 10*3/uL (ref 1.5–6.5)
NEUT%: 43.6 % (ref 38.4–76.8)
PLATELETS: 283 10*3/uL (ref 145–400)
RBC: 3.32 10*6/uL — AB (ref 3.70–5.45)
RDW: 15.5 % — ABNORMAL HIGH (ref 11.2–14.5)
RETIC CT ABS: 73.7 10*3/uL (ref 33.70–90.70)
Retic %: 2.22 % — ABNORMAL HIGH (ref 0.70–2.10)
WBC: 4.6 10*3/uL (ref 3.9–10.3)

## 2015-11-25 LAB — TSH: TSH: 0.924 m[IU]/L (ref 0.308–3.960)

## 2015-11-25 MED ORDER — SODIUM CHLORIDE 0.9 % IJ SOLN
10.0000 mL | INTRAMUSCULAR | Status: DC | PRN
  Administered 2015-11-25: 10 mL via INTRAVENOUS
  Filled 2015-11-25: qty 10

## 2015-11-25 MED ORDER — DENOSUMAB 120 MG/1.7ML ~~LOC~~ SOLN
120.0000 mg | Freq: Once | SUBCUTANEOUS | Status: AC
  Administered 2015-11-25: 120 mg via SUBCUTANEOUS
  Filled 2015-11-25: qty 1.7

## 2015-11-25 MED ORDER — HEPARIN SOD (PORK) LOCK FLUSH 100 UNIT/ML IV SOLN
500.0000 [IU] | Freq: Once | INTRAVENOUS | Status: AC | PRN
  Administered 2015-11-25: 500 [IU]
  Filled 2015-11-25: qty 5

## 2015-11-25 MED ORDER — SODIUM CHLORIDE 0.9 % IV SOLN
Freq: Once | INTRAVENOUS | Status: AC
  Administered 2015-11-25: 11:00:00 via INTRAVENOUS

## 2015-11-25 MED ORDER — SODIUM CHLORIDE 0.9 % IV SOLN
1200.0000 mg | Freq: Once | INTRAVENOUS | Status: AC
  Administered 2015-11-25: 1200 mg via INTRAVENOUS
  Filled 2015-11-25: qty 20

## 2015-11-25 MED ORDER — SODIUM CHLORIDE 0.9 % IJ SOLN
10.0000 mL | INTRAMUSCULAR | Status: DC | PRN
  Administered 2015-11-25: 10 mL
  Filled 2015-11-25: qty 10

## 2015-11-25 NOTE — Telephone Encounter (Signed)
AVS REPORT AND APPT SCHD GIVEN PER 11/25/15 LOS. °

## 2015-11-25 NOTE — Patient Instructions (Signed)
[facility] Discharge Instructions for Patients Receiving Chemotherapy  Today you received the following chemotherapy agents: Tecentriq  To help prevent nausea and vomiting after your treatment, we encourage you to take your nausea medication as directed.    If you develop nausea and vomiting that is not controlled by your nausea medication, call the clinic.   BELOW ARE SYMPTOMS THAT SHOULD BE REPORTED IMMEDIATELY:  *FEVER GREATER THAN 100.5 F  *CHILLS WITH OR WITHOUT FEVER  NAUSEA AND VOMITING THAT IS NOT CONTROLLED WITH YOUR NAUSEA MEDICATION  *UNUSUAL SHORTNESS OF BREATH  *UNUSUAL BRUISING OR BLEEDING  TENDERNESS IN MOUTH AND THROAT WITH OR WITHOUT PRESENCE OF ULCERS  *URINARY PROBLEMS  *BOWEL PROBLEMS  UNUSUAL RASH Items with * indicate a potential emergency and should be followed up as soon as possible.  Feel free to call the clinic you have any questions or concerns. The clinic phone number is [phone].  Please show the CHEMO ALERT CARD at check-in to the Emergency Department and triage nurse.   

## 2015-11-27 NOTE — Progress Notes (Signed)
Hematology oncology clinic follow-up.  Date of service. 11/25/2015  Patient Care Team: [name], MD as PCP - General (Internal Medicine)   Urologist: Dr. 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  Diagnosis:  Widely metastatic transitional cell carcinoma from the right renal pelvis.  Treatment -Atezolizumab IV q3weeks status post 16 cycles here for her 17th cycle.  HISTORY OF PRESENTING ILLNESS: Please see my previous notes for details of initial presentation.  INTERVAL HISTORY  Tiffany [name] is here for her scheduled follow-up prior to her next cycle of Atezolizumab. She was seen by her urologist for concern of local recurrence of her transitional Cell Carcinoma in Her Right Renal Pelvis. It was determined that she was not a surgical candidate for candidate for any other additional palliative procedures for local control. Patient otherwise feels well and a local recurrence does not seem to be overtly symptomatically at this time. She is keen to continue her current treatment which is quite reasonable. We discussed the pros and cons of considering radiation oncology evaluation with the patient is agreeable with tablet discussion regarding any role for palliative radiation therapy for local disease control in the right renal pelvis. Still having some mild intermittent rectal bleeding. No overt hematuria. Significant skin rash or mouth sores. No fevers or chills.  MEDICAL HISTORY:  Past Medical History:  Diagnosis Date  . Arthritis     knees 03-10-12 had Cortisone injection  . [facility]) june 2016   metastatic  . DDD (degenerative disc disease), cervical   . Edema leg    feet and ankles  . Esophagus disorder    Had esophagus stretched  . GERD (gastroesophageal reflux disease)    Benign stricture dilated in 2011  . Gout    Patient notes she has possible gout but has not been on chronic medications for this  . Headache(784.0)   . Hypertension   . Occipital neuralgia   . Occipital neuralgia    Related to cervical degenerative disc disease  . Peptic ulcer disease    Previous history in the remote past  . Ulcerative proctitis ([place])   . Ulcerative proctitis ([place]) 2014    SURGICAL HISTORY: Past Surgical History:  Procedure Laterality Date  . ABDOMINAL HYSTERECTOMY    . APPENDECTOMY    . CHOLECYSTECTOMY    . COLONOSCOPY WITH PROPOFOL  03/25/2012   Procedure: COLONOSCOPY WITH PROPOFOL;  Surgeon: Garlan Fair, MD;  Location: [facility];  Service: Endoscopy;  Laterality: N/A;  . DILATION AND CURETTAGE OF UTERUS    . ESOPHAGEAL DILATION     For benign stricture in 2011  . ESOPHAGOGASTRODUODENOSCOPY    . FLEXIBLE SIGMOIDOSCOPY N/A 04/26/2014   Procedure: FLEXIBLE SIGMOIDOSCOPY - UnSedated;  Surgeon: Garlan Fair, MD;  Location: [facility];  Service: Endoscopy;  Laterality: N/A;  . FLEXIBLE SIGMOIDOSCOPY N/A 01/24/2015   Procedure: FLEXIBLE SIGNMOIDOSCOPY W/ FLEET ENEMIA;  Surgeon: Garlan Fair, MD;  Location: [facility];  Service: Endoscopy;  Laterality: N/A;  . NECK SURGERY     20 years ago  . TOTAL ABDOMINAL HYSTERECTOMY W/ BILATERAL SALPINGOOPHORECTOMY     At age 76 due to miscarriage and retained products of conception causing significant uterine bleeding. Patient has been on estrogen replacement therapy since then.    SOCIAL HISTORY: Social History   Social History  . Marital status: Married    Spouse name: N/A  .  Number of children: N/A  . Years of education: N/A   Occupational History  . Not on file.   Social History Main Topics  . Smoking status: Former Smoker    Quit date: 11/01/1983  . Smokeless tobacco: Never Used  . Alcohol use Yes     Comment: occ  . Drug use: No  . Sexual activity: Not on file   Other Topics Concern  . Not on file   Social History Narrative  .  No narrative on file  Retired as Conservation officer, nature for [employer]. She is very well spoken and intelligent individual and exhibits a keen knowledge of her medical conditions.  FAMILY HISTORY: Family History  Problem Relation Age of Onset  . Lung cancer Sister   . Prostate cancer Brother   . Uterine cancer Maternal Aunt   . Breast cancer Paternal Grandmother   . Hodgkin's lymphoma Sister     ALLERGIES:  is allergic to nitrofurantoin; indomethacin; and lisinopril.  MEDICATIONS:  Current Outpatient Prescriptions  Medication Sig Dispense Refill  . acetaminophen (TYLENOL) 650 MG CR tablet Take 1,300 mg by mouth 2 (two) times daily.    Marland Kitchen amLODipine (NORVASC) 5 MG tablet Take 5 mg by mouth daily.    Marland Kitchen azithromycin (ZITHROMAX) 250 MG tablet Take 2 on day one then 1 daily x 4 days 6 tablet 11  . CANASA 1000 MG suppository Place 1,000 mg rectally daily as needed.   11  . COLCRYS 0.6 MG tablet TK 1 T PO  QD TO BID PRN ACUTE GOUT FLARE FOR 7 DAYS  1  . estradiol (ESTRACE) 0.5 MG tablet Take 0.5 mg by mouth daily.  1  . guaiFENesin (MUCINEX) 600 MG 12 hr tablet Take by mouth 2 (two) times daily.    Marland Kitchen HYDROcodone-acetaminophen (NORCO/VICODIN) 5-325 MG tablet Take 1-2 tablets by mouth every 4 (four) hours as needed for moderate pain or severe pain. 20 tablet 0  . mirtazapine (REMERON) 15 MG tablet Take 1 tablet (15 mg total) by mouth at bedtime. 30 tablet 0  . ondansetron (ZOFRAN) 4 MG tablet Take 1 tablet (4 mg total) by mouth every 8 (eight) hours as needed for nausea or vomiting. 30 tablet 0  . OVER THE COUNTER MEDICATION Apply 1 application topically. [place] dream over the counter cream for arthritis    . oxyCODONE (OXY IR/ROXICODONE) 5 MG immediate release tablet Take 0.5-1 tablets (2.5-5 mg total) by mouth every 4 (four) hours as needed for moderate pain, severe pain or breakthrough pain. 60 tablet 0  . PRESCRIPTION MEDICATION Chemo card    . Respiratory Therapy Supplies  (FLUTTER) DEVI 1 each by Does not apply route daily. 1 each 0  . senna-docusate (SENNA S) 8.6-50 MG per tablet Take 2 tablets by mouth at bedtime. 60 tablet 1  . triamcinolone ointment (KENALOG) 0.5 % Apply 1 application topically 2 (two) times daily. (Patient taking differently: Apply 1 application topically 2 (two) times daily as needed (for rash). ) 30 g 0   No current facility-administered medications for this visit.    REVIEW OF SYSTEMS:    10 point review of systems done and is negative except as noted above  PHYSICAL EXAMINATION: ECOG PERFORMANCE STATUS: 1  Vitals:   11/25/15 0930  BP: 136/64  Pulse: 86  Resp: 18  Temp: 98.5 F (36.9 C)   Filed Weights   11/25/15 0930  Weight: 128 lb 8 oz (58.3 kg)   GENERAL elderly Caucasian female, alert,  no distress and comfortable SKIN: resolved skin rashes EYES: normal, conjunctiva are pink and non-injected, sclera clear OROPHARYNX:no exudate, no erythema and lips, buccal mucosa, and tongue normal  NECK: supple, thyroid normal size, non-tender, without nodularity LYMPH:  no palpable lymphadenopathy in the cervical, axillary or inguinal LUNGS: few bibasilar rales, scattered rhonci, good air entry. HEART: regular rate & rhythm and no murmurs and no lower extremity edema ABDOMEN:abdomen soft, non-tender and normal bowel sounds Musculoskeletal:no cyanosis of digits and no clubbing  PSYCH: alert & oriented x 3 with fluent speech NEURO: no focal motor/sensory deficits  LABORATORY DATA:  CBC Latest Ref Rng & Units 11/25/2015 11/04/2015 10/14/2015  WBC 3.9 - 10.3 10e3/uL 4.6 6.1 5.4  Hemoglobin 11.6 - 15.9 g/dL 10.4(L) 11.2(L) 11.8  Hematocrit 34.8 - 46.6 % 32.1(L) 33.8(L) 35.4  Platelets 145 - 400 10e3/uL 283 324 276   . CMP Latest Ref Rng & Units 11/25/2015 11/04/2015 10/14/2015  Glucose 70 - 140 mg/dl 127 87 75  BUN 7.0 - 26.0 mg/dL 17.0 13.8 13.7  Creatinine 0.6 - 1.1 mg/dL 1.0 1.0 1.0  Sodium 136 - 145 mEq/L 140 140 141  Potassium  3.5 - 5.1 mEq/L 4.1 4.1 4.1  Chloride 101 - 111 mmol/L - - -  CO2 22 - 29 mEq/L 25 24 24   Calcium 8.4 - 10.4 mg/dL 9.2 9.3 9.1  Total Protein 6.4 - 8.3 g/dL 7.1 7.0 7.4  Total Bilirubin 0.20 - 1.20 mg/dL 0.35 0.33 0.49  Alkaline Phos 40 - 150 U/L 44 42 44  AST 5 - 34 U/L 14 16 22   ALT 0 - 55 U/L <9 <9 13   Radiology .Mr Abdomen W Wo Contrast  Result Date: 11/16/2015 CLINICAL DATA:  History of transitional cell carcinoma of right renal pelvis. Currently in remission. Evaluate for recurrence. EXAM: MRI ABDOMEN WITHOUT AND WITH CONTRAST TECHNIQUE: Multiplanar multisequence MR imaging of the abdomen was performed both before and after the administration of intravenous contrast. CONTRAST:  20mL MULTIHANCE GADOBENATE DIMEGLUMINE 529 MG/ML IV SOLN COMPARISON:  PET of 10/21/2015.  Abdominal CT of 11/09/2014. FINDINGS: Portions of exam are mildly motion degraded. Lower chest: Bronchiectasis with left lower lobe pulmonary nodularity, better evaluated on prior PET. Hepatobiliary: Mild T2 hyperintensity and delayed post-contrast enhancement in the right lobe of the liver measures 1.3 cm on image 6/series 4 and image 22/ series 905. Hypoattenuation in this area measured 19 mm on 04/27/2015 PET. No hypermetabolism on recent PET. Cholecystectomy, without biliary ductal dilatation. Pancreas: Pancreatic atrophy, without dominant mass. Spleen: Normal in size, without focal abnormality. Adrenals/Urinary Tract: Left adrenal thickening.  Left renal cysts. Right renal atrophy. Especially compared to the PET of 04/27/2015, development of hypo enhancing soft tissue within the right renal pelvis and extending toward the ureteropelvic junction. Example on the order of 3.3 x 3.4 cm on image 24/series 4. 4.1 x 3.0 cm on image 51/ series 903. Decreased right renal function without overt secondary hydronephrosis. Stomach/Bowel: Small hiatal hernia. Otherwise normal stomach and abdominal bowel loops. Vascular/Lymphatic: Advanced  aortic and branch vessel atherosclerosis. No retroperitoneal or retrocrural adenopathy. Other: No ascites. Musculoskeletal: No acute osseous abnormality. Convex left lumbar spine curvature. IMPRESSION: 1. Hypo enhancing soft tissue signal within the right renal pelvis is consistent with recurrent transitional cell carcinoma. 2. Right hepatic lobe lesion is likely related to treated hepatic metastasis. No hypermetabolism in this region on recent PET. 3. No new sites of metastatic disease identified. 4.  Aortic atherosclerosis. Electronically Signed   By: Marylyn Ishihara  Jobe Igo M.D.   On: 11/16/2015 11:16    ASSESSMENT & PLAN:   82 year old Caucasian female with  #1 Right Renal pelvis metastatic transitional cell carcinoma.  PET scan revealed metastases to the lungs, liver, multiple nodal stations and bones. She is status post 16 cycles of Atezolizumab. PET scan done after 8 cycles of treatment for restaging disease showed significant response to treatment with no evidence of active disease. Patient has no clinical evidence of disease progression at this time. CT chest 06/16/2015 showed no evidence of cancer progression. PET/CT scan done on 10/21/2015- showed no findings for metastatic disease. New right-sided hydronephrosis versus local tumor recurrence needs to be evaluated further. MRI of the kidney showed hyperenhancing the right renal pelvis lesion consistent with concern for local recurrence/  -Patient was recently evaluated by urology for this and deemed not to be a candidate for surgery for local disease control or any other intervention at this time.  #2 h/o skin rash likely related to her Atezolizumab. Minimal grade 1 and topical triamcinolone . #3 Cough and some clear secretions likely due to bronchiectasis - stable symptoms. No chest pain no increased shortness of breath or dyspnea on exertion . #4 minimal rectal bleeding due to inflammatory proctitis on 5-ASA suppositories as per Dr. Hassell Done. Plan   --Findings of the MRI of the kidneys was discussed with the patient details. -She understands the urologist recommendations. -She is okay with continuing Atezolizumab given that her Broader cancer is still under control. -We'll consult radiation oncology to determine if there is any role for palliative radiation therapy to her right renal pelvis for control of local disease and the rest of her disease is fairly stable. -Continue Xgeva every 6 weeks -Continue triamcinolone ointment for her skin rash as needed. (Has not needed this for the last few months). -Continue mesalamine suppositories as per Dr. Hassell Done for ulcerative proctitis- using on an as needed basis and hasnt needed it recently.  #4 history of hypertension   -on Amlodipine   RTC with Dr Irene Limbo in 3 weeks prior to next cycle of treatment with rpt labs. Earlier followup if any new concerns.   Sullivan Lone MD [place] [facility]/Oncology Physician [facility]  (Office):       [phone] (Work cell):  [phone] (Fax):           [phone]

## 2015-11-29 ENCOUNTER — Telehealth: Payer: Self-pay | Admitting: Radiation Oncology

## 2015-11-29 NOTE — Telephone Encounter (Signed)
Originally patient scheduled for an appointment with Dr. Tammi Klippel on Thursday, August 31st. Per physician order appointment rescheduled. Appointment moved to September 18th at 1230. Arrive at [facility] to register. Phoned patient's home and confirmed appointment. Phoned Lauren, RN for Dr. Irene Limbo and explained the appointment was moved. Lauren committed to informing dr. Irene Limbo of this finding, inquiring is 9/15 appointment with Irene Limbo should be moved accordingly and phoning the patient back reference med onc appt.

## 2015-12-01 ENCOUNTER — Ambulatory Visit: Payer: Medicare Other

## 2015-12-01 ENCOUNTER — Ambulatory Visit: Payer: Medicare Other | Admitting: Radiation Oncology

## 2015-12-11 ENCOUNTER — Other Ambulatory Visit: Payer: Self-pay | Admitting: Hematology

## 2015-12-14 DIAGNOSIS — K5120 Ulcerative (chronic) proctitis without complications: Secondary | ICD-10-CM | POA: Diagnosis not present

## 2015-12-14 DIAGNOSIS — Z23 Encounter for immunization: Secondary | ICD-10-CM | POA: Diagnosis not present

## 2015-12-16 ENCOUNTER — Other Ambulatory Visit: Payer: Self-pay | Admitting: *Deleted

## 2015-12-16 ENCOUNTER — Ambulatory Visit (HOSPITAL_BASED_OUTPATIENT_CLINIC_OR_DEPARTMENT_OTHER): Payer: Medicare Other

## 2015-12-16 ENCOUNTER — Ambulatory Visit (HOSPITAL_BASED_OUTPATIENT_CLINIC_OR_DEPARTMENT_OTHER): Payer: Medicare Other | Admitting: Hematology

## 2015-12-16 ENCOUNTER — Encounter: Payer: Self-pay | Admitting: Hematology

## 2015-12-16 ENCOUNTER — Other Ambulatory Visit (HOSPITAL_BASED_OUTPATIENT_CLINIC_OR_DEPARTMENT_OTHER): Payer: Medicare Other

## 2015-12-16 ENCOUNTER — Telehealth: Payer: Self-pay | Admitting: Hematology

## 2015-12-16 VITALS — BP 125/61 | HR 83 | Temp 98.4°F | Resp 18 | Ht 61.0 in | Wt 127.6 lb

## 2015-12-16 DIAGNOSIS — C7951 Secondary malignant neoplasm of bone: Secondary | ICD-10-CM | POA: Diagnosis not present

## 2015-12-16 DIAGNOSIS — C787 Secondary malignant neoplasm of liver and intrahepatic bile duct: Secondary | ICD-10-CM

## 2015-12-16 DIAGNOSIS — C659 Malignant neoplasm of unspecified renal pelvis: Secondary | ICD-10-CM

## 2015-12-16 DIAGNOSIS — C651 Malignant neoplasm of right renal pelvis: Secondary | ICD-10-CM

## 2015-12-16 DIAGNOSIS — R05 Cough: Secondary | ICD-10-CM

## 2015-12-16 DIAGNOSIS — R21 Rash and other nonspecific skin eruption: Secondary | ICD-10-CM

## 2015-12-16 DIAGNOSIS — C778 Secondary and unspecified malignant neoplasm of lymph nodes of multiple regions: Secondary | ICD-10-CM

## 2015-12-16 DIAGNOSIS — C7800 Secondary malignant neoplasm of unspecified lung: Secondary | ICD-10-CM

## 2015-12-16 LAB — CBC & DIFF AND RETIC
BASO%: 0.3 % (ref 0.0–2.0)
Basophils Absolute: 0 10*3/uL (ref 0.0–0.1)
EOS ABS: 0.3 10*3/uL (ref 0.0–0.5)
EOS%: 5.2 % (ref 0.0–7.0)
HCT: 33.3 % — ABNORMAL LOW (ref 34.8–46.6)
HEMOGLOBIN: 10.7 g/dL — AB (ref 11.6–15.9)
Immature Retic Fract: 11.5 % — ABNORMAL HIGH (ref 1.60–10.00)
LYMPH#: 1.9 10*3/uL (ref 0.9–3.3)
LYMPH%: 32.4 % (ref 14.0–49.7)
MCH: 31 pg (ref 25.1–34.0)
MCHC: 32.1 g/dL (ref 31.5–36.0)
MCV: 96.5 fL (ref 79.5–101.0)
MONO#: 1.2 10*3/uL — AB (ref 0.1–0.9)
MONO%: 20 % — ABNORMAL HIGH (ref 0.0–14.0)
NEUT%: 42.1 % (ref 38.4–76.8)
NEUTROS ABS: 2.5 10*3/uL (ref 1.5–6.5)
Platelets: 290 10*3/uL (ref 145–400)
RBC: 3.45 10*6/uL — ABNORMAL LOW (ref 3.70–5.45)
RDW: 15.3 % — AB (ref 11.2–14.5)
RETIC %: 2.27 % — AB (ref 0.70–2.10)
RETIC CT ABS: 78.32 10*3/uL (ref 33.70–90.70)
WBC: 5.8 10*3/uL (ref 3.9–10.3)

## 2015-12-16 LAB — COMPREHENSIVE METABOLIC PANEL
ALBUMIN: 3.1 g/dL — AB (ref 3.5–5.0)
ALK PHOS: 51 U/L (ref 40–150)
AST: 15 U/L (ref 5–34)
Anion Gap: 9 mEq/L (ref 3–11)
BUN: 14.3 mg/dL (ref 7.0–26.0)
CHLORIDE: 106 meq/L (ref 98–109)
CO2: 23 mEq/L (ref 22–29)
CREATININE: 1.1 mg/dL (ref 0.6–1.1)
Calcium: 9.3 mg/dL (ref 8.4–10.4)
EGFR: 44 mL/min/{1.73_m2} — ABNORMAL LOW (ref >90)
GLUCOSE: 142 mg/dL — AB (ref 70–140)
POTASSIUM: 4.4 meq/L (ref 3.5–5.1)
SODIUM: 138 meq/L (ref 136–145)
Total Bilirubin: 0.47 mg/dL (ref 0.20–1.20)
Total Protein: 7.2 g/dL (ref 6.4–8.3)

## 2015-12-16 MED ORDER — SODIUM CHLORIDE 0.9 % IV SOLN
1200.0000 mg | Freq: Once | INTRAVENOUS | Status: AC
  Administered 2015-12-16: 1200 mg via INTRAVENOUS
  Filled 2015-12-16: qty 20

## 2015-12-16 MED ORDER — HEPARIN SOD (PORK) LOCK FLUSH 100 UNIT/ML IV SOLN
500.0000 [IU] | Freq: Once | INTRAVENOUS | Status: AC | PRN
  Administered 2015-12-16: 500 [IU]
  Filled 2015-12-16: qty 5

## 2015-12-16 MED ORDER — SODIUM CHLORIDE 0.9 % IJ SOLN
10.0000 mL | INTRAMUSCULAR | Status: DC | PRN
  Administered 2015-12-16: 10 mL
  Filled 2015-12-16: qty 10

## 2015-12-16 MED ORDER — SODIUM CHLORIDE 0.9 % IV SOLN
Freq: Once | INTRAVENOUS | Status: AC
  Administered 2015-12-16: 12:00:00 via INTRAVENOUS

## 2015-12-16 NOTE — Patient Instructions (Signed)
[facility] Discharge Instructions for Patients Receiving Chemotherapy  Today you received the following chemotherapy agents Tecentriq To help prevent nausea and vomiting after your treatment, we encourage you to take your nausea medication as prescribed.   If you develop nausea and vomiting that is not controlled by your nausea medication, call the clinic.   BELOW ARE SYMPTOMS THAT SHOULD BE REPORTED IMMEDIATELY:  *FEVER GREATER THAN 100.5 F  *CHILLS WITH OR WITHOUT FEVER  NAUSEA AND VOMITING THAT IS NOT CONTROLLED WITH YOUR NAUSEA MEDICATION  *UNUSUAL SHORTNESS OF BREATH  *UNUSUAL BRUISING OR BLEEDING  TENDERNESS IN MOUTH AND THROAT WITH OR WITHOUT PRESENCE OF ULCERS  *URINARY PROBLEMS  *BOWEL PROBLEMS  UNUSUAL RASH Items with * indicate a potential emergency and should be followed up as soon as possible.  Feel free to call the clinic you have any questions or concerns. The clinic phone number is [phone].  Please show the CHEMO ALERT CARD at check-in to the Emergency Department and triage nurse.   

## 2015-12-16 NOTE — Telephone Encounter (Signed)
Gave patient avs report and appointments for October  °

## 2015-12-17 NOTE — Progress Notes (Signed)
Hematology oncology clinic follow-up.  Date of service. 12/16/2015  Patient Care Team: [name], MD as PCP - General (Internal Medicine)   Urologist: Dr. 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  Diagnosis:  Widely metastatic transitional cell carcinoma from the right renal pelvis.  Treatment -Atezolizumab IV q3weeks status post 16 cycles here for her 18th cycle.  HISTORY OF PRESENTING ILLNESS: Please see my previous notes for details of initial presentation.  INTERVAL HISTORY  Ms Montanari is here for her scheduled follow-up prior to her next cycle of Atezolizumab. She nodes no acute new symptoms since her last visit.  No abdominal pain.  No flank pain.  No hematuria.  He notes some mild chronic cough that is unchanged.  No new shortness of breath.  Mild intermittent rectal bleeding for which he recently saw GI again.  He was recommended steroid enema but wants to hold off on that at this time.  Continues to use her 5-ASA suppositories. No Significant skin rash or mouth sores. No fevers or chills.  MEDICAL HISTORY:  Past Medical History:  Diagnosis Date  . Arthritis     knees 03-10-12 had Cortisone injection  . Cancer [facility]) june 2016   metastatic  . DDD (degenerative disc disease), cervical   . Edema leg    feet and ankles  . Esophagus disorder    Had esophagus stretched  . GERD (gastroesophageal reflux disease)    Benign stricture dilated in 2011  . Gout    Patient notes she has possible gout but has not been on chronic medications for this  . Headache(784.0)   . Hypertension   . Occipital neuralgia   . Occipital neuralgia    Related to cervical degenerative disc disease  . Peptic ulcer disease    Previous history in the remote past  . Ulcerative proctitis ([place])   . Ulcerative proctitis ([place]) 2014    SURGICAL  HISTORY: Past Surgical History:  Procedure Laterality Date  . ABDOMINAL HYSTERECTOMY    . APPENDECTOMY    . CHOLECYSTECTOMY    . COLONOSCOPY WITH PROPOFOL  03/25/2012   Procedure: COLONOSCOPY WITH PROPOFOL;  Surgeon: Garlan Fair, MD;  Location: [facility];  Service: Endoscopy;  Laterality: N/A;  . DILATION AND CURETTAGE OF UTERUS    . ESOPHAGEAL DILATION     For benign stricture in 2011  . ESOPHAGOGASTRODUODENOSCOPY    . FLEXIBLE SIGMOIDOSCOPY N/A 04/26/2014   Procedure: FLEXIBLE SIGMOIDOSCOPY - UnSedated;  Surgeon: Garlan Fair, MD;  [facility];  Service: Endoscopy;  Laterality: N/A;  . FLEXIBLE SIGMOIDOSCOPY N/A 01/24/2015   Procedure: FLEXIBLE SIGNMOIDOSCOPY W/ FLEET ENEMIA;  Surgeon: Garlan Fair, MD;  [facility];  Service: Endoscopy;  Laterality: N/A;  . NECK SURGERY     20 years ago  . TOTAL ABDOMINAL HYSTERECTOMY W/ BILATERAL SALPINGOOPHORECTOMY     At age 66 due to miscarriage and retained products of conception causing significant uterine bleeding. Patient has been on estrogen replacement therapy since then.    SOCIAL HISTORY: Social History   Social History  . Marital status: Married    Spouse name: N/A  . Number of children: N/A  . Years of education: N/A   Occupational History  . Not on file.   Social History Main Topics  . Smoking status: Former Smoker    Quit date: 11/01/1983  . Smokeless tobacco: Never  Used  . Alcohol use Yes     Comment: occ  . Drug use: No  . Sexual activity: Not on file   Other Topics Concern  . Not on file   Social History Narrative  . No narrative on file  Retired as Conservation officer, nature for [employer]. She is very well spoken and intelligent individual and exhibits a keen knowledge of her medical conditions.  FAMILY HISTORY: Family History  Problem Relation Age of Onset  . Lung cancer Sister   . Prostate cancer Brother   . Uterine cancer Maternal Aunt   . Breast cancer  Paternal Grandmother   . Hodgkin's lymphoma Sister     ALLERGIES:  is allergic to nitrofurantoin; indomethacin; and lisinopril.  MEDICATIONS:  Current Outpatient Prescriptions  Medication Sig Dispense Refill  . acetaminophen (TYLENOL) 650 MG CR tablet Take 1,300 mg by mouth 2 (two) times daily.    Marland Kitchen amLODipine (NORVASC) 5 MG tablet Take 5 mg by mouth daily.    Marland Kitchen CANASA 1000 MG suppository Place 1,000 mg rectally daily as needed.   11  . COLCRYS 0.6 MG tablet TK 1 T PO  QD TO BID PRN ACUTE GOUT FLARE FOR 7 DAYS  1  . estradiol (ESTRACE) 0.5 MG tablet Take 0.5 mg by mouth daily.  1  . guaiFENesin (MUCINEX) 600 MG 12 hr tablet Take by mouth 2 (two) times daily.    Marland Kitchen HYDROcodone-acetaminophen (NORCO/VICODIN) 5-325 MG tablet Take 1-2 tablets by mouth every 4 (four) hours as needed for moderate pain or severe pain. 20 tablet 0  . mirtazapine (REMERON) 15 MG tablet TAKE ONE TABLET BY MOUTH AT BEDTIME 30 tablet 0  . ondansetron (ZOFRAN) 4 MG tablet Take 1 tablet (4 mg total) by mouth every 8 (eight) hours as needed for nausea or vomiting. 30 tablet 0  . OVER THE COUNTER MEDICATION Apply 1 application topically. [place] dream over the counter cream for arthritis    . oxyCODONE (OXY IR/ROXICODONE) 5 MG immediate release tablet Take 0.5-1 tablets (2.5-5 mg total) by mouth every 4 (four) hours as needed for moderate pain, severe pain or breakthrough pain. 60 tablet 0  . PRESCRIPTION MEDICATION Chemo card    . Respiratory Therapy Supplies (FLUTTER) DEVI 1 each by Does not apply route daily. 1 each 0  . senna-docusate (SENNA S) 8.6-50 MG per tablet Take 2 tablets by mouth at bedtime. 60 tablet 1  . triamcinolone ointment (KENALOG) 0.5 % Apply 1 application topically 2 (two) times daily. (Patient taking differently: Apply 1 application topically 2 (two) times daily as needed (for rash). ) 30 g 0   No current facility-administered medications for this visit.    REVIEW OF SYSTEMS:    10 point review  of systems done and is negative except as noted above  PHYSICAL EXAMINATION: ECOG PERFORMANCE STATUS: 1  Vitals:   12/16/15 1010  BP: 125/61  Pulse: 83  Resp: 18  Temp: 98.4 F (36.9 C)   Filed Weights   12/16/15 1010  Weight: 127 lb 9.6 oz (57.9 kg)   GENERAL elderly Caucasian female, alert, no distress and comfortable SKIN: resolved skin rashes EYES: normal, conjunctiva are pink and non-injected, sclera clear OROPHARYNX:no exudate, no erythema and lips, buccal mucosa, and tongue normal  NECK: supple, thyroid normal size, non-tender, without nodularity LYMPH:  no palpable lymphadenopathy in the cervical, axillary or inguinal LUNGS: few bibasilar rales, scattered rhonci, good air entry. HEART: regular rate & rhythm and no murmurs and  no lower extremity edema ABDOMEN:abdomen soft, non-tender and normal bowel sounds Musculoskeletal:no cyanosis of digits and no clubbing  PSYCH: alert & oriented x 3 with fluent speech NEURO: no focal motor/sensory deficits  LABORATORY DATA:  CBC Latest Ref Rng & Units 12/16/2015 11/25/2015 11/04/2015  WBC 3.9 - 10.3 10e3/uL 5.8 4.6 6.1  Hemoglobin 11.6 - 15.9 g/dL 10.7(L) 10.4(L) 11.2(L)  Hematocrit 34.8 - 46.6 % 33.3(L) 32.1(L) 33.8(L)  Platelets 145 - 400 10e3/uL 290 283 324   . CMP Latest Ref Rng & Units 12/16/2015 11/25/2015 11/04/2015  Glucose 70 - 140 mg/dl 142(H) 127 87  BUN 7.0 - 26.0 mg/dL 14.3 17.0 13.8  Creatinine 0.6 - 1.1 mg/dL 1.1 1.0 1.0  Sodium 136 - 145 mEq/L 138 140 140  Potassium 3.5 - 5.1 mEq/L 4.4 4.1 4.1  Chloride 101 - 111 mmol/L - - -  CO2 22 - 29 mEq/L 23 25 24   Calcium 8.4 - 10.4 mg/dL 9.3 9.2 9.3  Total Protein 6.4 - 8.3 g/dL 7.2 7.1 7.0  Total Bilirubin 0.20 - 1.20 mg/dL 0.47 0.35 0.33  Alkaline Phos 40 - 150 U/L 51 44 42  AST 5 - 34 U/L 15 14 16   ALT 0 - 55 U/L <9 <9 <9   Radiology .No results found.  ASSESSMENT & PLAN:   82 year old Caucasian female with  #1 Right Renal pelvis metastatic transitional cell  carcinoma.  PET scan revealed metastases to the lungs, liver, multiple nodal stations and bones. She is status post 16 cycles of Atezolizumab. PET scan done after 8 cycles of treatment for restaging disease showed significant response to treatment with no evidence of active disease. Patient has no clinical evidence of disease progression at this time. CT chest 06/16/2015 showed no evidence of cancer progression. PET/CT scan done on 10/21/2015- showed no findings for metastatic disease. New right-sided hydronephrosis versus local tumor recurrence needs to be evaluated further. MRI of the kidney showed hyperenhancing the right renal pelvis lesion consistent with concern for local recurrence.  -Patient was recently evaluated by urology for this and deemed not to be a candidate for surgery for local disease control or any other intervention at this time.  #2 h/o skin rash likely related to her Atezolizumab. Minimal grade 1 and topical triamcinolone . #3 Cough and some clear secretions likely due to bronchiectasis - stable symptoms. No chest pain no increased shortness of breath or dyspnea on exertion . #4 minimal rectal bleeding due to inflammatory proctitis on 5-ASA suppositories as per Dr.  Kathyrn Lass  --labs stable.  Patient has no new treatment toxicities.  No clinical symptoms suggestive of overt disease recurrence at this time. -We'll continue her Atezolizumab given that her Broader cancer is still under control. -she has a visit with radiation oncology/Dr Tammi Klippel on 12/19/2015 to determine if there is any role for palliative radiation therapy to her right renal pelvis for control of local disease as the rest of her disease is fairly stable. Now vs if she were to get symptomatic from this. -Continue Xgeva every 6 weeks -Continue triamcinolone ointment for her skin rash as needed. (Has not needed this for the last few months). -Continue mesalamine suppositories as per Dr. Hassell Done for ulcerative proctitis.  If these symptoms were to worsen she could use a hydrocortisone enema. #4 history of hypertension   -on Amlodipine   RTC with Dr Irene Limbo in 3 weeks prior to next cycle of treatment with rpt labs. Earlier followup if any new concerns. Next imaging after next cycle  of treatment.   Sullivan Lone MD [place] Hematology/Oncology Physician [facility]  (Office):       [phone] (Work cell):  [phone] (Fax):           [phone]

## 2015-12-19 ENCOUNTER — Ambulatory Visit
Admission: RE | Admit: 2015-12-19 | Discharge: 2015-12-19 | Disposition: A | Discharge status: Home / Self Care | Payer: Medicare Other | Source: Ambulatory Visit | Attending: Radiation Oncology | Admitting: Radiation Oncology

## 2015-12-19 ENCOUNTER — Encounter: Payer: Self-pay | Admitting: Radiation Oncology

## 2015-12-19 VITALS — BP 141/67 | HR 76 | Temp 98.2°F | Resp 18 | Ht 61.0 in | Wt 128.2 lb

## 2015-12-19 DIAGNOSIS — I10 Essential (primary) hypertension: Secondary | ICD-10-CM | POA: Diagnosis not present

## 2015-12-19 DIAGNOSIS — K5120 Ulcerative (chronic) proctitis without complications: Secondary | ICD-10-CM | POA: Diagnosis not present

## 2015-12-19 DIAGNOSIS — Z9049 Acquired absence of other specified parts of digestive tract: Secondary | ICD-10-CM | POA: Diagnosis not present

## 2015-12-19 DIAGNOSIS — Z90710 Acquired absence of both cervix and uterus: Secondary | ICD-10-CM | POA: Insufficient documentation

## 2015-12-19 DIAGNOSIS — K219 Gastro-esophageal reflux disease without esophagitis: Secondary | ICD-10-CM | POA: Diagnosis not present

## 2015-12-19 DIAGNOSIS — Z90722 Acquired absence of ovaries, bilateral: Secondary | ICD-10-CM | POA: Insufficient documentation

## 2015-12-19 DIAGNOSIS — K625 Hemorrhage of anus and rectum: Secondary | ICD-10-CM | POA: Diagnosis not present

## 2015-12-19 DIAGNOSIS — C651 Malignant neoplasm of right renal pelvis: Secondary | ICD-10-CM

## 2015-12-19 DIAGNOSIS — C7901 Secondary malignant neoplasm of right kidney and renal pelvis: Secondary | ICD-10-CM | POA: Insufficient documentation

## 2015-12-19 DIAGNOSIS — M5030 Other cervical disc degeneration, unspecified cervical region: Secondary | ICD-10-CM | POA: Diagnosis not present

## 2015-12-19 DIAGNOSIS — M1990 Unspecified osteoarthritis, unspecified site: Secondary | ICD-10-CM | POA: Diagnosis not present

## 2015-12-19 DIAGNOSIS — Z9079 Acquired absence of other genital organ(s): Secondary | ICD-10-CM | POA: Insufficient documentation

## 2015-12-19 DIAGNOSIS — R05 Cough: Secondary | ICD-10-CM | POA: Insufficient documentation

## 2015-12-19 DIAGNOSIS — M109 Gout, unspecified: Secondary | ICD-10-CM | POA: Diagnosis not present

## 2015-12-19 NOTE — Progress Notes (Signed)
[place]         [phone] ________________________________  Initial outpatient Consultation  Name: Tiffany [name] MRN: [id number]  Date: 12/19/2015  DOB: 1924-08-02  REFERRING PHYSICIAN: [name], MD  DIAGNOSIS: 82 yo woman with stage IV metastatic transitional cell carcinoma of Tiffany right renal pelvis    ICD-9-CM ICD-10-CM   1. Transitional cell carcinoma of renal pelvis, right (HCC) 189.1 C65.1     HISTORY OF PRESENT ILLNESS::Tiffany [name] is a 82 y.o. female who was referred to Dr. Irene Limbo from her PCP Dr. [name] on 11/16/2014 for evaluation and management of a metastatic malignancy on CT abdomen done at an outside facility. In May 2016 Tiffany [name] noted discomfort with urination and urinary frequency. Tiffany [name] was treated with ciprofloxacin to treat a urinary tract infection. Her symptoms persisted so Tiffany [name] was treated with Nitrofurantoin.  Her symptoms continued, so Tiffany [name] was referred to a urologist. Tiffany [name] had a cystoscopy on 09/27/2014 revealing mild-to-moderate erythema in Tiffany posterior wall of Tiffany bladder with white flecks in her urine. Tiffany [name] was reevaluated by Dr. Matilde Sprang at [facility] urology on 11/09/2014 for chronic cystitis. Tiffany [name] had a CT of Tiffany abdomen without contrast on 11/19/2014 for further evaluation. This showed a possible right kidney pelvis mass, retroperitoneal lymphadenopathy, and widespread liver lesions concerning for metastatic malignancy possibly RCC versus TCC. A CT-guided biopsy of one of her liver lesions  Came back as transitional cell carcinoma from Tiffany right renal pelvis. Tiffany [name] has since been treated with Atezolizumab IV q3weeks status post 16 cycles here for her 18th cycle. A PET on 10/21/15 showed no findings for metastatic disease. New right-sided hydronephrosis versus local tumor recurrence needs to be evaluated further. MRI of kidney showed hyperenhancing Tiffany right renal pelvis lesion consistent with concern for local recurrence.  Tiffany [name] notes no  new symptoms since her last visit. Tiffany [name] presents with no abdominal pain, or flank pain. Tiffany [name] notes no hematuria. Tiffany [name] notes a chronic mild cough. Tiffany [name] denies new SOB. Tiffany [name] has mild intermittent rectal bleeding for which Tiffany [name] recently saw GI again. Tiffany [name] was recommended steroid enema but wants to hold off on that at this time. Tiffany [name] continues to use her 5-ASA suppositories. There are no significant skin rashes or mouth sores. No fever or chills.  Currently denies hematuria and flank pain.  PREVIOUS RADIATION THERAPY: No  Past Medical History:  Diagnosis Date  . Arthritis     knees 03-10-12 had Cortisone injection  . Cancer [facility]) june 2016   metastatic  . DDD (degenerative disc disease), cervical   . Edema leg    feet and ankles  . Esophagus disorder    Had esophagus stretched  . GERD (gastroesophageal reflux disease)    Benign stricture dilated in 2011  . Gout    [name] notes Tiffany [name] has possible gout but has not been on chronic medications for this  . Headache(784.0)   . Hypertension   . Occipital neuralgia   . Occipital neuralgia    Related to cervical degenerative disc disease  . Peptic ulcer disease    Previous history in Tiffany remote past  . Ulcerative proctitis ([place])   . Ulcerative proctitis ([place]) 2014  :   Past Surgical History:  Procedure Laterality Date  . ABDOMINAL HYSTERECTOMY    . APPENDECTOMY    . CHOLECYSTECTOMY    . COLONOSCOPY WITH PROPOFOL  03/25/2012   Procedure: COLONOSCOPY WITH PROPOFOL;  Surgeon: Garlan Fair, MD;  [facility];  Service:  Endoscopy;  Laterality: N/A;  . DILATION AND CURETTAGE OF UTERUS    . ESOPHAGEAL DILATION     For benign stricture in 2011  . ESOPHAGOGASTRODUODENOSCOPY    . FLEXIBLE SIGMOIDOSCOPY N/A 04/26/2014   Procedure: FLEXIBLE SIGMOIDOSCOPY - UnSedated;  Surgeon: Garlan Fair, MD;  [facility];  Service: Endoscopy;  Laterality: N/A;  . FLEXIBLE SIGMOIDOSCOPY N/A 01/24/2015   Procedure: FLEXIBLE SIGNMOIDOSCOPY W/  FLEET ENEMIA;  Surgeon: Garlan Fair, MD;  [facility];  Service: Endoscopy;  Laterality: N/A;  . NECK SURGERY     20 years ago  . TOTAL ABDOMINAL HYSTERECTOMY W/ BILATERAL SALPINGOOPHORECTOMY     At age 66 due to miscarriage and retained products of conception causing significant uterine bleeding. [name] has been on estrogen replacement therapy since then.  :   Current Outpatient Prescriptions:  .  acetaminophen (TYLENOL) 650 MG CR tablet, Take 1,300 mg by mouth 2 (two) times daily., Disp: , Rfl:  .  amLODipine (NORVASC) 5 MG tablet, Take 5 mg by mouth daily., Disp: , Rfl:  .  CANASA 1000 MG suppository, Place 1,000 mg rectally daily as needed. , Disp: , Rfl: 11 .  COLCRYS 0.6 MG tablet, TK 1 T PO  QD TO BID PRN ACUTE GOUT FLARE FOR 7 DAYS, Disp: , Rfl: 1 .  estradiol (ESTRACE) 0.5 MG tablet, Take 0.5 mg by mouth daily., Disp: , Rfl: 1 .  guaiFENesin (MUCINEX) 600 MG 12 hr tablet, Take by mouth 2 (two) times daily., Disp: , Rfl:  .  mirtazapine (REMERON) 15 MG tablet, TAKE ONE TABLET BY MOUTH AT BEDTIME, Disp: 30 tablet, Rfl: 0 .  OVER Tiffany COUNTER MEDICATION, Apply 1 application topically. [place] dream over Tiffany counter cream for arthritis, Disp: , Rfl:  .  Respiratory Therapy Supplies (FLUTTER) DEVI, 1 each by Does not apply route daily., Disp: 1 each, Rfl: 0 .  triamcinolone ointment (KENALOG) 0.5 %, Apply 1 application topically 2 (two) times daily. ([name] taking differently: Apply 1 application topically 2 (two) times daily as needed (for rash). ), Disp: 30 g, Rfl: 0 .  HYDROcodone-acetaminophen (NORCO/VICODIN) 5-325 MG tablet, Take 1-2 tablets by mouth every 4 (four) hours as needed for moderate pain or severe pain. ([name] not taking: Reported on 12/19/2015), Disp: 20 tablet, Rfl: 0 .  ondansetron (ZOFRAN) 4 MG tablet, Take 1 tablet (4 mg total) by mouth every 8 (eight) hours as needed for nausea or vomiting. ([name] not taking: Reported on 12/19/2015), Disp: 30  tablet, Rfl: 0 .  oxyCODONE (OXY IR/ROXICODONE) 5 MG immediate release tablet, Take 0.5-1 tablets (2.5-5 mg total) by mouth every 4 (four) hours as needed for moderate pain, severe pain or breakthrough pain. ([name] not taking: Reported on 12/19/2015), Disp: 60 tablet, Rfl: 0 .  PRESCRIPTION MEDICATION, Chemo card, Disp: , Rfl:  .  senna-docusate (SENNA S) 8.6-50 MG per tablet, Take 2 tablets by mouth at bedtime. ([name] not taking: Reported on 12/19/2015), Disp: 60 tablet, Rfl: 1:   Allergies  Allergen Reactions  . Nitrofurantoin Other (See Comments)    Trembling  . Indomethacin Swelling and Other (See Comments)    Tongue swelling  . Lisinopril Swelling and Other (See Comments)    Tongue swelling  :   Family History  Problem Relation Age of Onset  . Lung cancer Sister   . Prostate cancer Brother   . Uterine cancer Maternal Aunt   . Breast cancer Paternal Grandmother   . Hodgkin's lymphoma Sister   :  Social History   Social History  . Marital status: Married    Spouse name: N/A  . Number of children: N/A  . Years of education: N/A   Occupational History  . Not on file.   Social History Main Topics  . Smoking status: Former Smoker    Quit date: 11/01/1983  . Smokeless tobacco: Never Used  . Alcohol use Yes     Comment: occ  . Drug use: No  . Sexual activity: No   Other Topics Concern  . Not on file   Social History Narrative  . No narrative on file  :  REVIEW OF SYSTEMS:  A 15 point review of systems is documented in Tiffany electronic medical record. This was obtained by Tiffany nursing staff. However, I reviewed this with Tiffany [name] to discuss relevant findings and make appropriate changes.  Pertinent items are noted in HPI.   PHYSICAL EXAM:  Blood pressure (!) 141/67, pulse 76, temperature 98.2 F (36.8 C), temperature source Oral, resp. rate 18, height 5\' 1"  (1.549 m), weight 128 lb 3.2 oz (58.2 kg), SpO2 96 %.   In general this is a well appearing woman in no  acute distress. Tiffany [name]'s alert and oriented x4 and appropriate throughout Tiffany examination. Cardiopulmonary assessment is negative for acute distress and Tiffany [name] exhibits normal effort.    KPS = 100  100 - Normal; no complaints; no evidence of disease. 90   - Able to carry on normal activity; minor signs or symptoms of disease. 80   - Normal activity with effort; some signs or symptoms of disease. 32   - Cares for self; unable to carry on normal activity or to do active work. 60   - Requires occasional assistance, but is able to care for most of his personal needs. 50   - Requires considerable assistance and frequent medical care. 50   - Disabled; requires special care and assistance. 19   - Severely disabled; hospital admission is indicated although death not imminent. 27   - Very sick; hospital admission necessary; active supportive treatment necessary. 10   - Moribund; fatal processes progressing rapidly. 0     - Dead  Karnofsky DA, Abelmann [place]rock, Craver LS and Burchenal [facility] [phone]) Tiffany use of Tiffany nitrogen mustards in Tiffany palliative treatment of carcinoma: with particular reference to bronchogenic carcinoma Cancer 1 634-56  LABORATORY DATA:  Lab Results  Component Value Date   WBC 5.8 12/16/2015   HGB 10.7 (L) 12/16/2015   HCT 33.3 (L) 12/16/2015   MCV 96.5 12/16/2015   PLT 290 12/16/2015   Lab Results  Component Value Date   NA 138 12/16/2015   K 4.4 12/16/2015   CL 101 10/17/2014   CO2 23 12/16/2015   Lab Results  Component Value Date   ALT <9 12/16/2015   AST 15 12/16/2015   ALKPHOS 51 12/16/2015   BILITOT 0.47 12/16/2015     RADIOGRAPHY: Reviewed PET-CT with [name] today     IMPRESSION: 82 yo woman with stage IV metastatic transitional cell carcinoma of Tiffany right renal pelvis.  Tiffany [name] may benefit from palliative radiotherapy in Tiffany event of hematuria or flank pain.  At this time, Tiffany [name] is not complaining of these issues.  PLAN:Today, I talked to Tiffany [name] and family  about Tiffany findings and work-up thus far.  We discussed Tiffany natural history of transitional cell carcinoma of Tiffany renal pelvis and general treatment, highlighting Tiffany role of radiotherapy in Tiffany palliation of stage IV  disease.  We discussed Tiffany available radiation techniques, and focused on Tiffany details of logistics and delivery.  We reviewed Tiffany anticipated acute and late sequelae associated with radiation in this setting.  Tiffany [name] was encouraged to ask questions that I answered to Tiffany best of my ability.    Tiffany [name] will continue to follow with medical oncology reserving radiation for palliation of hematuria, flank pain or other symptoms if needed.   I spent 30 minutes face to face with Tiffany [name] and more than 50% of that time was spent in counseling and/or coordination of care.   ------------------------------------------------   Tyler Pita, MD [place] Director and Director of Stereotactic Radiosurgery Direct Dial: [phone]  Fax: [phone] [email or web address]  Skype  [email or web address]   This document serves as a record of services personally performed by Tyler Pita, MD. It was created on his behalf by Bethann Humble, a trained medical scribe. Tiffany creation of this record is based on Tiffany scribe's personal observations and Tiffany provider's statements to them. This document has been checked and approved by Tiffany attending provider.

## 2015-12-19 NOTE — Progress Notes (Signed)
GU Location of Tumor / Histology: right renal pelvis metastatic transitional cell carcinoma    Past/Anticipated interventions by urology, if any: evaluated by Dr. Phebe Colla who feels there is no role for any sort of heroic surgery.  Past/Anticipated interventions by medical oncology, if any: Atezolizumab IV every three weeks    Weight changes, if any:  Wt Readings from Last 3 Encounters:  12/19/15 128 lb 3.2 oz (58.2 kg)  12/16/15 127 lb 9.6 oz (57.9 kg)  11/25/15 128 lb 8 oz (58.3 kg)    Bowel/Bladder complaints, if any: minimal rectal bleeding without significant diarrhea, chronic dysuria   Nausea/Vomiting, if any: No  Pain issues, if any: No  SAFETY ISSUES:  Prior radiation? No  Pacemaker/ICD? No  Possible current pregnancy? no  Is the patient on methotrexate? no  Current Complaints / other details:  82 year old female BP (!) 141/67 (BP Location: Right Arm, Patient Position: Sitting, Cuff Size: Normal)   Pulse 76   Temp 98.2 F (36.8 C) (Oral)   Resp 18   Ht 5\' 1"  (1.549 m)   Wt 128 lb 3.2 oz (58.2 kg)   SpO2 96%   BMI 24.22 kg/m

## 2015-12-19 NOTE — Addendum Note (Signed)
Encounter addended by: Malena Edman, RN on: 12/19/2015  3:01 PM<BR>    Actions taken: Charge Capture section accepted

## 2016-01-05 ENCOUNTER — Other Ambulatory Visit: Payer: Self-pay | Admitting: *Deleted

## 2016-01-05 DIAGNOSIS — C659 Malignant neoplasm of unspecified renal pelvis: Secondary | ICD-10-CM

## 2016-01-06 ENCOUNTER — Ambulatory Visit: Payer: Medicare Other

## 2016-01-06 ENCOUNTER — Encounter: Payer: Self-pay | Admitting: Hematology

## 2016-01-06 ENCOUNTER — Ambulatory Visit (HOSPITAL_BASED_OUTPATIENT_CLINIC_OR_DEPARTMENT_OTHER): Payer: Medicare Other

## 2016-01-06 ENCOUNTER — Other Ambulatory Visit: Payer: Self-pay | Admitting: *Deleted

## 2016-01-06 ENCOUNTER — Telehealth: Payer: Self-pay | Admitting: Hematology

## 2016-01-06 ENCOUNTER — Other Ambulatory Visit (HOSPITAL_BASED_OUTPATIENT_CLINIC_OR_DEPARTMENT_OTHER): Payer: Medicare Other

## 2016-01-06 ENCOUNTER — Ambulatory Visit (HOSPITAL_BASED_OUTPATIENT_CLINIC_OR_DEPARTMENT_OTHER): Payer: Medicare Other | Admitting: Hematology

## 2016-01-06 ENCOUNTER — Telehealth: Payer: Self-pay | Admitting: *Deleted

## 2016-01-06 VITALS — BP 133/57 | HR 72 | Temp 98.0°F | Resp 18 | Wt 127.5 lb

## 2016-01-06 DIAGNOSIS — Z5112 Encounter for antineoplastic immunotherapy: Secondary | ICD-10-CM

## 2016-01-06 DIAGNOSIS — C651 Malignant neoplasm of right renal pelvis: Secondary | ICD-10-CM | POA: Diagnosis not present

## 2016-01-06 DIAGNOSIS — C7951 Secondary malignant neoplasm of bone: Secondary | ICD-10-CM

## 2016-01-06 DIAGNOSIS — C7800 Secondary malignant neoplasm of unspecified lung: Secondary | ICD-10-CM

## 2016-01-06 DIAGNOSIS — Z79899 Other long term (current) drug therapy: Secondary | ICD-10-CM | POA: Diagnosis not present

## 2016-01-06 DIAGNOSIS — C779 Secondary and unspecified malignant neoplasm of lymph node, unspecified: Secondary | ICD-10-CM

## 2016-01-06 DIAGNOSIS — C659 Malignant neoplasm of unspecified renal pelvis: Secondary | ICD-10-CM

## 2016-01-06 DIAGNOSIS — I10 Essential (primary) hypertension: Secondary | ICD-10-CM

## 2016-01-06 DIAGNOSIS — Z95828 Presence of other vascular implants and grafts: Secondary | ICD-10-CM

## 2016-01-06 DIAGNOSIS — C787 Secondary malignant neoplasm of liver and intrahepatic bile duct: Secondary | ICD-10-CM | POA: Diagnosis not present

## 2016-01-06 DIAGNOSIS — R05 Cough: Secondary | ICD-10-CM

## 2016-01-06 LAB — COMPREHENSIVE METABOLIC PANEL
ALBUMIN: 3.3 g/dL — AB (ref 3.5–5.0)
ALK PHOS: 45 U/L (ref 40–150)
ALT: <9 U/L (ref 0–55)
ANION GAP: 9 meq/L (ref 3–11)
AST: 18 U/L (ref 5–34)
BUN: 12.3 mg/dL (ref 7.0–26.0)
CALCIUM: 9.3 mg/dL (ref 8.4–10.4)
CO2: 24 mEq/L (ref 22–29)
Chloride: 107 mEq/L (ref 98–109)
Creatinine: 1.1 mg/dL (ref 0.6–1.1)
EGFR: 47 mL/min/{1.73_m2} — AB (ref >90)
Glucose: 73 mg/dl (ref 70–140)
POTASSIUM: 4.4 meq/L (ref 3.5–5.1)
Sodium: 140 mEq/L (ref 136–145)
Total Bilirubin: 0.42 mg/dL (ref 0.20–1.20)
Total Protein: 7.6 g/dL (ref 6.4–8.3)

## 2016-01-06 LAB — CBC & DIFF AND RETIC
BASO%: 0.2 % (ref 0.0–2.0)
BASOS ABS: 0 10*3/uL (ref 0.0–0.1)
EOS%: 5.3 % (ref 0.0–7.0)
Eosinophils Absolute: 0.3 10*3/uL (ref 0.0–0.5)
HEMATOCRIT: 32.4 % — AB (ref 34.8–46.6)
HEMOGLOBIN: 10.3 g/dL — AB (ref 11.6–15.9)
Immature Retic Fract: 15.4 % — ABNORMAL HIGH (ref 1.60–10.00)
LYMPH%: 43.9 % (ref 14.0–49.7)
MCH: 30.7 pg (ref 25.1–34.0)
MCHC: 31.8 g/dL (ref 31.5–36.0)
MCV: 96.4 fL (ref 79.5–101.0)
MONO#: 1 10*3/uL — ABNORMAL HIGH (ref 0.1–0.9)
MONO%: 16.4 % — AB (ref 0.0–14.0)
NEUT#: 2 10*3/uL (ref 1.5–6.5)
NEUT%: 34.2 % — AB (ref 38.4–76.8)
NRBC: 0 % (ref 0–0)
Platelets: 271 10*3/uL (ref 145–400)
RBC: 3.36 10*6/uL — ABNORMAL LOW (ref 3.70–5.45)
RDW: 15.1 % — AB (ref 11.2–14.5)
Retic %: 2.69 % — ABNORMAL HIGH (ref 0.70–2.10)
Retic Ct Abs: 90.38 10*3/uL (ref 33.70–90.70)
WBC: 5.9 10*3/uL (ref 3.9–10.3)
lymph#: 2.6 10*3/uL (ref 0.9–3.3)

## 2016-01-06 LAB — TSH: TSH: 1.176 m[IU]/L (ref 0.308–3.960)

## 2016-01-06 MED ORDER — SODIUM CHLORIDE 0.9 % IJ SOLN
10.0000 mL | INTRAMUSCULAR | Status: DC | PRN
  Administered 2016-01-06: 10 mL
  Filled 2016-01-06: qty 10

## 2016-01-06 MED ORDER — SODIUM CHLORIDE 0.9 % IV SOLN
1200.0000 mg | Freq: Once | INTRAVENOUS | Status: AC
  Administered 2016-01-06: 1200 mg via INTRAVENOUS
  Filled 2016-01-06: qty 20

## 2016-01-06 MED ORDER — DENOSUMAB 120 MG/1.7ML ~~LOC~~ SOLN
120.0000 mg | Freq: Once | SUBCUTANEOUS | Status: AC
  Administered 2016-01-06: 120 mg via SUBCUTANEOUS
  Filled 2016-01-06: qty 1.7

## 2016-01-06 MED ORDER — SODIUM CHLORIDE 0.9 % IV SOLN
Freq: Once | INTRAVENOUS | Status: AC
  Administered 2016-01-06: 13:00:00 via INTRAVENOUS

## 2016-01-06 MED ORDER — HEPARIN SOD (PORK) LOCK FLUSH 100 UNIT/ML IV SOLN
500.0000 [IU] | Freq: Once | INTRAVENOUS | Status: AC | PRN
  Administered 2016-01-06: 500 [IU]
  Filled 2016-01-06: qty 5

## 2016-01-06 MED ORDER — MIRTAZAPINE 15 MG PO TABS: 15.0000 mg | ORAL_TABLET | Freq: Every day | ORAL | 0 refills | Status: DC

## 2016-01-06 NOTE — Telephone Encounter (Signed)
Message sent to chemo scheduler to add chemo. Avs report and appointment schedule given to patient, per 01/06/16 los.

## 2016-01-06 NOTE — Patient Instructions (Signed)
[facility] Discharge Instructions for Patients Receiving Chemotherapy  Today you received the following chemotherapy agents :  Tecentriq and Xgeva.  To help prevent nausea and vomiting after your treatment, we encourage you to take your nausea medication as prescribed.   If you develop nausea and vomiting that is not controlled by your nausea medication, call the clinic.   BELOW ARE SYMPTOMS THAT SHOULD BE REPORTED IMMEDIATELY:  *FEVER GREATER THAN 100.5 F  *CHILLS WITH OR WITHOUT FEVER  NAUSEA AND VOMITING THAT IS NOT CONTROLLED WITH YOUR NAUSEA MEDICATION  *UNUSUAL SHORTNESS OF BREATH  *UNUSUAL BRUISING OR BLEEDING  TENDERNESS IN MOUTH AND THROAT WITH OR WITHOUT PRESENCE OF ULCERS  *URINARY PROBLEMS  *BOWEL PROBLEMS  UNUSUAL RASH Items with * indicate a potential emergency and should be followed up as soon as possible.  Feel free to call the clinic you have any questions or concerns. The clinic phone number is [phone].  Please show the [place] at check-in to the Emergency Department and triage nurse.

## 2016-01-06 NOTE — Telephone Encounter (Signed)
Per LOS I have scheduled appts and notified the scheduler 

## 2016-01-16 ENCOUNTER — Telehealth: Payer: Self-pay | Admitting: Medical Oncology

## 2016-01-16 NOTE — Progress Notes (Signed)
Hematology oncology clinic follow-up.  Date of service. 01/06/2016  Patient Care Team: [name], MD as PCP - General (Internal Medicine)   Urologist: Dr. 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  Diagnosis:  Widely metastatic transitional cell carcinoma from the right renal pelvis.  Treatment -Atezolizumab IV q3weeks status post 18 cycles here for her 19th cycle.  HISTORY OF PRESENTING ILLNESS: Please see my previous notes for details of initial presentation.  INTERVAL HISTORY  Tiffany [name] is here for her scheduled follow-up prior to her next cycle of Atezolizumab. She notess no acute new symptoms since her last visit.  No abdominal pain.  No flank pain.  No hematuria.  She was evaluated by radiation oncology and was understandably explained role of RT if/when she became symptoms from her local recurrence in the rt pelvis. No RT at this time. No other acute new symptoms.  MEDICAL HISTORY:  Past Medical History:  Diagnosis Date  . Arthritis     knees 03-10-12 had Cortisone injection  . Cancer [facility]) june 2016   metastatic  . DDD (degenerative disc disease), cervical   . Edema leg    feet and ankles  . Esophagus disorder    Had esophagus stretched  . GERD (gastroesophageal reflux disease)    Benign stricture dilated in 2011  . Gout    Patient notes she has possible gout but has not been on chronic medications for this  . Headache(784.0)   . Hypertension   . Occipital neuralgia   . Occipital neuralgia    Related to cervical degenerative disc disease  . Peptic ulcer disease    Previous history in the remote past  . Ulcerative proctitis ([place])   . Ulcerative proctitis ([place]) 2014    SURGICAL HISTORY: Past Surgical History:  Procedure Laterality Date  . ABDOMINAL HYSTERECTOMY    . APPENDECTOMY    . CHOLECYSTECTOMY      . COLONOSCOPY WITH PROPOFOL  03/25/2012   Procedure: COLONOSCOPY WITH PROPOFOL;  Surgeon: Garlan Fair, MD;  Location: [facility];  Service: Endoscopy;  Laterality: N/A;  . DILATION AND CURETTAGE OF UTERUS    . ESOPHAGEAL DILATION     For benign stricture in 2011  . ESOPHAGOGASTRODUODENOSCOPY    . FLEXIBLE SIGMOIDOSCOPY N/A 04/26/2014   Procedure: FLEXIBLE SIGMOIDOSCOPY - UnSedated;  Surgeon: Garlan Fair, MD;  [facility];  Service: Endoscopy;  Laterality: N/A;  . FLEXIBLE SIGMOIDOSCOPY N/A 01/24/2015   Procedure: FLEXIBLE SIGNMOIDOSCOPY W/ FLEET ENEMIA;  Surgeon: Garlan Fair, MD;  Location: [facility];  Service: Endoscopy;  Laterality: N/A;  . NECK SURGERY     20 years ago  . TOTAL ABDOMINAL HYSTERECTOMY W/ BILATERAL SALPINGOOPHORECTOMY     At age 20 due to miscarriage and retained products of conception causing significant uterine bleeding. Patient has been on estrogen replacement therapy since then.    SOCIAL HISTORY: Social History   Social History  . Marital status: Married    Spouse name: N/A  . Number of children: N/A  . Years of education: N/A   Occupational History  . Not on file.   Social History Main Topics  . Smoking status: Former Smoker    Quit date: 11/01/1983  . Smokeless tobacco: Never Used  . Alcohol use Yes     Comment: occ  . Drug use: No  . Sexual activity: No   Other  Topics Concern  . Not on file   Social History Narrative  . No narrative on file  Retired as Conservation officer, nature for [employer]. She is very well spoken and intelligent individual and exhibits a keen knowledge of her medical conditions.  FAMILY HISTORY: Family History  Problem Relation Age of Onset  . Lung cancer Sister   . Prostate cancer Brother   . Uterine cancer Maternal Aunt   . Breast cancer Paternal Grandmother   . Hodgkin's lymphoma Sister     ALLERGIES:  is allergic to nitrofurantoin; indomethacin; and lisinopril.  MEDICATIONS:   Current Outpatient Prescriptions  Medication Sig Dispense Refill  . acetaminophen (TYLENOL) 650 MG CR tablet Take 1,300 mg by mouth 2 (two) times daily.    Marland Kitchen amLODipine (NORVASC) 5 MG tablet Take 5 mg by mouth daily.    Marland Kitchen CANASA 1000 MG suppository Place 1,000 mg rectally daily as needed.   11  . COLCRYS 0.6 MG tablet TK 1 T PO  QD TO BID PRN ACUTE GOUT FLARE FOR 7 DAYS  1  . estradiol (ESTRACE) 0.5 MG tablet Take 0.5 mg by mouth daily.  1  . guaiFENesin (MUCINEX) 600 MG 12 hr tablet Take by mouth 2 (two) times daily.    Marland Kitchen HYDROcodone-acetaminophen (NORCO/VICODIN) 5-325 MG tablet Take 1-2 tablets by mouth every 4 (four) hours as needed for moderate pain or severe pain. 20 tablet 0  . ondansetron (ZOFRAN) 4 MG tablet Take 1 tablet (4 mg total) by mouth every 8 (eight) hours as needed for nausea or vomiting. 30 tablet 0  . OVER THE COUNTER MEDICATION Apply 1 application topically. [place] dream over the counter cream for arthritis    . oxyCODONE (OXY IR/ROXICODONE) 5 MG immediate release tablet Take 0.5-1 tablets (2.5-5 mg total) by mouth every 4 (four) hours as needed for moderate pain, severe pain or breakthrough pain. 60 tablet 0  . PRESCRIPTION MEDICATION Chemo card    . Respiratory Therapy Supplies (FLUTTER) DEVI 1 each by Does not apply route daily. 1 each 0  . senna-docusate (SENNA S) 8.6-50 MG per tablet Take 2 tablets by mouth at bedtime. 60 tablet 1  . triamcinolone ointment (KENALOG) 0.5 % Apply 1 application topically 2 (two) times daily. (Patient taking differently: Apply 1 application topically 2 (two) times daily as needed (for rash). ) 30 g 0  . mirtazapine (REMERON) 15 MG tablet Take 1 tablet (15 mg total) by mouth at bedtime. 90 tablet 0  . PROCTOFOAM HC rectal foam INSTILL 1 APPLICATORFUL RECTALLY EACH EVENING  12   No current facility-administered medications for this visit.    REVIEW OF SYSTEMS:    10 point review of systems done and is negative except as noted  above  PHYSICAL EXAMINATION: ECOG PERFORMANCE STATUS: 1  Vitals:   01/06/16 1109  BP: (!) 133/57  Pulse: 72  Resp: 18  Temp: 98 F (36.7 C)   Filed Weights   01/06/16 1109  Weight: 127 lb 8 oz (57.8 kg)   GENERAL elderly Caucasian female, alert, no distress and comfortable SKIN: resolved skin rashes EYES: normal, conjunctiva are pink and non-injected, sclera clear OROPHARYNX:no exudate, no erythema and lips, buccal mucosa, and tongue normal  NECK: supple, thyroid normal size, non-tender, without nodularity LYMPH:  no palpable lymphadenopathy in the cervical, axillary or inguinal LUNGS: few bibasilar rales, scattered rhonci, good air entry. HEART: regular rate & rhythm and no murmurs and no lower extremity edema ABDOMEN:abdomen soft, non-tender and normal  bowel sounds Musculoskeletal:no cyanosis of digits and no clubbing  PSYCH: alert & oriented x 3 with fluent speech NEURO: no focal motor/sensory deficits  LABORATORY DATA:  CBC Latest Ref Rng & Units 01/06/2016 12/16/2015 11/25/2015  WBC 3.9 - 10.3 10e3/uL 5.9 5.8 4.6  Hemoglobin 11.6 - 15.9 g/dL 10.3(L) 10.7(L) 10.4(L)  Hematocrit 34.8 - 46.6 % 32.4(L) 33.3(L) 32.1(L)  Platelets 145 - 400 10e3/uL 271 290 283   . CMP Latest Ref Rng & Units 01/06/2016 12/16/2015 11/25/2015  Glucose 70 - 140 mg/dl 73 142(H) 127  BUN 7.0 - 26.0 mg/dL 12.3 14.3 17.0  Creatinine 0.6 - 1.1 mg/dL 1.1 1.1 1.0  Sodium 136 - 145 mEq/L 140 138 140  Potassium 3.5 - 5.1 mEq/L 4.4 4.4 4.1  Chloride 101 - 111 mmol/L - - -  CO2 22 - 29 mEq/L 24 23 25   Calcium 8.4 - 10.4 mg/dL 9.3 9.3 9.2  Total Protein 6.4 - 8.3 g/dL 7.6 7.2 7.1  Total Bilirubin 0.20 - 1.20 mg/dL 0.42 0.47 0.35  Alkaline Phos 40 - 150 U/L 45 51 44  AST 5 - 34 U/L 18 15 14   ALT 0 - 55 U/L <9 <9 <9   Radiology .No results found.  ASSESSMENT & PLAN:   82 year old Caucasian female with  #1 Right Renal pelvis metastatic transitional cell carcinoma.  PET scan revealed metastases to  the lungs, liver, multiple nodal stations and bones. She is status post 16 cycles of Atezolizumab. PET scan done after 8 cycles of treatment for restaging disease showed significant response to treatment with no evidence of active disease. Patient has no clinical evidence of disease progression at this time. CT chest 06/16/2015 showed no evidence of cancer progression. PET/CT scan done on 10/21/2015- showed no findings for metastatic disease. New right-sided hydronephrosis versus local tumor recurrence needs to be evaluated further. MRI of the kidney showed hyperenhancing the right renal pelvis lesion consistent with concern for local recurrence.  -Patient was recently evaluated by urology for this and deemed not to be a candidate for surgery for local disease control or any other intervention at this time.  -Patient has been seen by radiation oncology and has had input from them regarding the potential role of RT if her rt renal pelvis lesions were to become symptomatic.  #2 h/o skin rash likely related to her Atezolizumab. Minimal grade 1 and topical triamcinolone . #3 Cough and some clear secretions likely due to bronchiectasis - stable symptoms. No chest pain no increased shortness of breath or dyspnea on exertion . #4 minimal rectal bleeding due to inflammatory proctitis on 5-ASA suppositories as per Dr.  Kathyrn Lass  --labs stable.  Patient has no new treatment toxicities.  No clinical symptoms suggestive of overt disease recurrence at this time. -We'll continue her Atezolizumab given that her Broader cancer is still under control. -appreciate input from radiation oncology. -Continue Xgeva every 6 weeks -Continue triamcinolone ointment for her skin rash as needed. (Has not needed this for the last few months). -Continue mesalamine suppositories as per Dr. Hassell Done for ulcerative proctitis. If these symptoms were to worsen she could use a hydrocortisone enema. Has been given proctofoam. -breathing  has been stable. Has f/u with Dr Melvyn Novas. Has not needed to use any prednisone recently. -PET/CT every 3-4 months next in late novermber. 2017.  #4 history of hypertension   -on Amlodipine   RTC with Dr Irene Limbo in 3 weeks prior to next cycle of treatment with rpt labs. Earlier followup if any  new concerns.    Tiffany Lone MD New Site Hematology/Oncology Physician [facility]  (Office):       [phone] (Work cell):  [phone] (Fax):           [phone]

## 2016-01-16 NOTE — Telephone Encounter (Signed)
Confirmed next infusion appts.

## 2016-01-23 ENCOUNTER — Ambulatory Visit: Payer: Medicare Other | Admitting: Internal Medicine

## 2016-01-27 ENCOUNTER — Encounter: Payer: Self-pay | Admitting: Nurse Practitioner

## 2016-01-27 ENCOUNTER — Ambulatory Visit (HOSPITAL_BASED_OUTPATIENT_CLINIC_OR_DEPARTMENT_OTHER): Payer: Medicare Other

## 2016-01-27 ENCOUNTER — Other Ambulatory Visit (HOSPITAL_BASED_OUTPATIENT_CLINIC_OR_DEPARTMENT_OTHER): Payer: Medicare Other

## 2016-01-27 ENCOUNTER — Ambulatory Visit: Payer: Medicare Other

## 2016-01-27 ENCOUNTER — Ambulatory Visit (HOSPITAL_BASED_OUTPATIENT_CLINIC_OR_DEPARTMENT_OTHER): Payer: Medicare Other | Admitting: Nurse Practitioner

## 2016-01-27 VITALS — BP 143/59 | HR 70 | Temp 97.7°F | Resp 20 | Wt 126.7 lb

## 2016-01-27 DIAGNOSIS — C569 Malignant neoplasm of unspecified ovary: Secondary | ICD-10-CM

## 2016-01-27 DIAGNOSIS — C7951 Secondary malignant neoplasm of bone: Secondary | ICD-10-CM | POA: Diagnosis not present

## 2016-01-27 DIAGNOSIS — Z5112 Encounter for antineoplastic immunotherapy: Secondary | ICD-10-CM | POA: Diagnosis not present

## 2016-01-27 DIAGNOSIS — C659 Malignant neoplasm of unspecified renal pelvis: Secondary | ICD-10-CM

## 2016-01-27 DIAGNOSIS — C787 Secondary malignant neoplasm of liver and intrahepatic bile duct: Secondary | ICD-10-CM

## 2016-01-27 DIAGNOSIS — C651 Malignant neoplasm of right renal pelvis: Secondary | ICD-10-CM

## 2016-01-27 DIAGNOSIS — Z95828 Presence of other vascular implants and grafts: Secondary | ICD-10-CM

## 2016-01-27 LAB — COMPREHENSIVE METABOLIC PANEL
ALT: 7 U/L (ref 0–55)
AST: 17 U/L (ref 5–34)
Albumin: 3.2 g/dL — ABNORMAL LOW (ref 3.5–5.0)
Alkaline Phosphatase: 46 U/L (ref 40–150)
Anion Gap: 6 mEq/L (ref 3–11)
BILIRUBIN TOTAL: 0.42 mg/dL (ref 0.20–1.20)
BUN: 15.1 mg/dL (ref 7.0–26.0)
CO2: 25 meq/L (ref 22–29)
CREATININE: 1 mg/dL (ref 0.6–1.1)
Calcium: 9 mg/dL (ref 8.4–10.4)
Chloride: 107 mEq/L (ref 98–109)
EGFR: 52 mL/min/{1.73_m2} — ABNORMAL LOW (ref >90)
GLUCOSE: 82 mg/dL (ref 70–140)
Potassium: 4.1 mEq/L (ref 3.5–5.1)
SODIUM: 138 meq/L (ref 136–145)
TOTAL PROTEIN: 7.5 g/dL (ref 6.4–8.3)

## 2016-01-27 LAB — CBC & DIFF AND RETIC
BASO%: 0.3 % (ref 0.0–2.0)
Basophils Absolute: 0 10*3/uL (ref 0.0–0.1)
EOS ABS: 0.3 10*3/uL (ref 0.0–0.5)
EOS%: 5 % (ref 0.0–7.0)
HCT: 32.1 % — ABNORMAL LOW (ref 34.8–46.6)
HGB: 10.2 g/dL — ABNORMAL LOW (ref 11.6–15.9)
IMMATURE RETIC FRACT: 11.2 % — AB (ref 1.60–10.00)
LYMPH%: 35.8 % (ref 14.0–49.7)
MCH: 30.5 pg (ref 25.1–34.0)
MCHC: 31.8 g/dL (ref 31.5–36.0)
MCV: 96.1 fL (ref 79.5–101.0)
MONO#: 1.3 10*3/uL — AB (ref 0.1–0.9)
MONO%: 20.6 % — ABNORMAL HIGH (ref 0.0–14.0)
NEUT%: 38.3 % — ABNORMAL LOW (ref 38.4–76.8)
NEUTROS ABS: 2.5 10*3/uL (ref 1.5–6.5)
NRBC: 0 % (ref 0–0)
PLATELETS: 271 10*3/uL (ref 145–400)
RBC: 3.34 10*6/uL — AB (ref 3.70–5.45)
RDW: 14.8 % — ABNORMAL HIGH (ref 11.2–14.5)
RETIC CT ABS: 73.15 10*3/uL (ref 33.70–90.70)
Retic %: 2.19 % — ABNORMAL HIGH (ref 0.70–2.10)
WBC: 6.5 10*3/uL (ref 3.9–10.3)
lymph#: 2.3 10*3/uL (ref 0.9–3.3)

## 2016-01-27 MED ORDER — SODIUM CHLORIDE 0.9 % IJ SOLN
10.0000 mL | INTRAMUSCULAR | Status: DC | PRN
  Administered 2016-01-27: 10 mL
  Filled 2016-01-27: qty 10

## 2016-01-27 MED ORDER — HEPARIN SOD (PORK) LOCK FLUSH 100 UNIT/ML IV SOLN
500.0000 [IU] | Freq: Once | INTRAVENOUS | Status: AC | PRN
Start: 2016-01-27 — End: 2016-01-27
  Administered 2016-01-27: 500 [IU]
  Filled 2016-01-27: qty 5

## 2016-01-27 MED ORDER — SODIUM CHLORIDE 0.9 % IV SOLN
1200.0000 mg | Freq: Once | INTRAVENOUS | Status: AC
  Administered 2016-01-27: 1200 mg via INTRAVENOUS
  Filled 2016-01-27: qty 20

## 2016-01-27 MED ORDER — SODIUM CHLORIDE 0.9 % IV SOLN
Freq: Once | INTRAVENOUS | Status: AC
  Administered 2016-01-27: 11:00:00 via INTRAVENOUS

## 2016-01-27 MED ORDER — SODIUM CHLORIDE 0.9 % IJ SOLN
10.0000 mL | INTRAMUSCULAR | Status: DC | PRN
  Administered 2016-01-27: 10 mL via INTRAVENOUS
  Filled 2016-01-27: qty 10

## 2016-01-27 NOTE — Assessment & Plan Note (Signed)
Patient presents to the [facility] today to receive cycle 20 of her Tecentriq therapy.  She received her last Xgeva injection on 01/06/2016.  She will continue to receive the Xgeva on an every 6 weeks basis.  Patient states she's feeling very well today.  She has no new complaints or issues.  She states she has no rash at this current time.  She continues with occasional, chronic rectal bleeding that has been diagnosed as chronic inflammatory proctitis in the past.  She uses proctofoam and suppositories as directed.    Labs were all within normal limits; and hemoglobin was stable today.  Patient was afebrile as well.  Patient will proceed today with her treatment as planned.  She will return on 02/17/2016 for labs, flush, visit, and her next cycle of therapy.

## 2016-01-27 NOTE — Progress Notes (Signed)
Established patient  HPI:  Tiffany [name] 82 y.o. female diagnosed with renal cancer with both bone and liver metastasis.  Currently undergoing tecentriq and Xgeva therapy.    No history exists.    Review of Systems  Gastrointestinal:       Chronic history of rectal bleeding.  All other systems reviewed and are negative.   Past Medical History:  Diagnosis Date  . Arthritis     knees 03-10-12 had Cortisone injection  . Cancer [facility]) june 2016   metastatic  . DDD (degenerative disc disease), cervical   . Edema leg    feet and ankles  . Esophagus disorder    Had esophagus stretched  . GERD (gastroesophageal reflux disease)    Benign stricture dilated in 2011  . Gout    Patient notes she has possible gout but has not been on chronic medications for this  . Headache(784.0)   . Hypertension   . Occipital neuralgia   . Occipital neuralgia    Related to cervical degenerative disc disease  . Peptic ulcer disease    Previous history in the remote past  . Ulcerative proctitis ([place])   . Ulcerative proctitis ([place]) 2014    Past Surgical History:  Procedure Laterality Date  . ABDOMINAL HYSTERECTOMY    . APPENDECTOMY    . CHOLECYSTECTOMY    . COLONOSCOPY WITH PROPOFOL  03/25/2012   Procedure: COLONOSCOPY WITH PROPOFOL;  Surgeon: Garlan Fair, MD;  [facility];  Service: Endoscopy;  Laterality: N/A;  . DILATION AND CURETTAGE OF UTERUS    . ESOPHAGEAL DILATION     For benign stricture in 2011  . ESOPHAGOGASTRODUODENOSCOPY    . FLEXIBLE SIGMOIDOSCOPY N/A 04/26/2014   Procedure: FLEXIBLE SIGMOIDOSCOPY - UnSedated;  Surgeon: Garlan Fair, MD;  [facility];  Service: Endoscopy;  Laterality: N/A;  . FLEXIBLE SIGMOIDOSCOPY N/A 01/24/2015   Procedure: FLEXIBLE SIGNMOIDOSCOPY W/ FLEET ENEMIA;  Surgeon: Garlan Fair, MD;  [facility];  Service: Endoscopy;  Laterality: N/A;  . NECK SURGERY     20 years ago  . TOTAL ABDOMINAL HYSTERECTOMY W/  BILATERAL SALPINGOOPHORECTOMY     At age 89 due to miscarriage and retained products of conception causing significant uterine bleeding. Patient has been on estrogen replacement therapy since then.    has Transitional cell carcinoma of renal pelvis ([place]); Insomnia; Bronchopneumonia; Liver metastases ([place]); Bone metastases ([place]); Bronchiectasis ([place]) with probable Assoc MAI ; Bronchiolitis; and Port catheter in place on her problem list.    is allergic to nitrofurantoin; indomethacin; and lisinopril.    Medication List       Accurate as of 01/27/16 11:21 AM. Always use your most recent med list.          acetaminophen 650 MG CR tablet Commonly known as:  TYLENOL Take 1,300 mg by mouth 2 (two) times daily.   amLODipine 5 MG tablet Commonly known as:  NORVASC Take 5 mg by mouth daily.   azithromycin 250 MG tablet Commonly known as:  ZITHROMAX Take 1 tablet by mouth daily. Last day 10/28   CANASA 1000 MG suppository Generic drug:  mesalamine Place 1,000 mg rectally daily as needed.   COLCRYS 0.6 MG tablet Generic drug:  colchicine TK 1 T PO  QD TO BID PRN ACUTE GOUT FLARE FOR 7 DAYS   estradiol 0.5 MG tablet Commonly known as:  ESTRACE Take 0.5 mg by mouth daily.   FLUTTER Devi 1 each by Does not apply route  daily.   guaiFENesin 600 MG 12 hr tablet Commonly known as:  MUCINEX Take by mouth 2 (two) times daily.   HYDROcodone-acetaminophen 5-325 MG tablet Commonly known as:  NORCO/VICODIN Take 1-2 tablets by mouth every 4 (four) hours as needed for moderate pain or severe pain.   mirtazapine 15 MG tablet Commonly known as:  REMERON Take 1 tablet (15 mg total) by mouth at bedtime.   ondansetron 4 MG tablet Commonly known as:  ZOFRAN Take 1 tablet (4 mg total) by mouth every 8 (eight) hours as needed for nausea or vomiting.   OVER THE COUNTER MEDICATION Apply 1 application topically. [place] dream over the counter cream for arthritis   oxyCODONE 5 MG  immediate release tablet Commonly known as:  Oxy IR/ROXICODONE Take 0.5-1 tablets (2.5-5 mg total) by mouth every 4 (four) hours as needed for moderate pain, severe pain or breakthrough pain.   PRESCRIPTION MEDICATION Chemo card   [place] HC rectal foam Generic drug:  hydrocortisone-pramoxine INSTILL 1 APPLICATORFUL RECTALLY EACH EVENING   senna-docusate 8.6-50 MG tablet Commonly known as:  SENNA S Take 2 tablets by mouth at bedtime.   triamcinolone ointment 0.5 % Commonly known as:  KENALOG Apply 1 application topically 2 (two) times daily.        PHYSICAL EXAMINATION  Oncology Vitals 01/27/2016 01/06/2016  Height - -  Weight 57.471 kg 57.834 kg  Weight (lbs) 126 lbs 11 oz 127 lbs 8 oz  BMI (kg/m2) 23.94 kg/m2 24.09 kg/m2  Temp 97.7 98  Pulse 70 72  Resp 20 18  SpO2 97 100  BSA (m2) 1.57 m2 1.58 m2   BP Readings from Last 2 Encounters:  01/27/16 (!) 143/59  01/06/16 (!) 133/57    Physical Exam  Constitutional: She is oriented to person, place, and time and well-developed, well-nourished, and in no distress.  HENT:  Head: Normocephalic and atraumatic.  Eyes: Conjunctivae and EOM are normal. Pupils are equal, round, and reactive to light.  Neck: Normal range of motion.  Pulmonary/Chest: Effort normal. No respiratory distress.  Musculoskeletal: Normal range of motion. She exhibits no edema or tenderness.  Neurological: She is alert and oriented to person, place, and time.  Uses a cane to ambulate  Skin: Skin is warm and dry.  Psychiatric: Affect normal.  Nursing note and vitals reviewed.   LABORATORY DATA:. Appointment on 01/27/2016  Component Date Value Ref Range Status  . WBC 01/27/2016 6.5  3.9 - 10.3 10e3/uL Final  . NEUT# 01/27/2016 2.5  1.5 - 6.5 10e3/uL Final  . HGB 01/27/2016 10.2* 11.6 - 15.9 g/dL Final  . HCT 01/27/2016 32.1* 34.8 - 46.6 % Final  . Platelets 01/27/2016 271  145 - 400 10e3/uL Final  . MCV 01/27/2016 96.1  79.5 - 101.0 fL Final    . MCH 01/27/2016 30.5  25.1 - 34.0 pg Final  . MCHC 01/27/2016 31.8  31.5 - 36.0 g/dL Final  . RBC 01/27/2016 3.34* 3.70 - 5.45 10e6/uL Final  . RDW 01/27/2016 14.8* 11.2 - 14.5 % Final  . lymph# 01/27/2016 2.3  0.9 - 3.3 10e3/uL Final  . MONO# 01/27/2016 1.3* 0.1 - 0.9 10e3/uL Final  . Eosinophils Absolute 01/27/2016 0.3  0.0 - 0.5 10e3/uL Final  . Basophils Absolute 01/27/2016 0.0  0.0 - 0.1 10e3/uL Final  . NEUT% 01/27/2016 38.3* 38.4 - 76.8 % Final  . LYMPH% 01/27/2016 35.8  14.0 - 49.7 % Final  . MONO% 01/27/2016 20.6* 0.0 - 14.0 % Final  . EOS%  01/27/2016 5.0  0.0 - 7.0 % Final  . BASO% 01/27/2016 0.3  0.0 - 2.0 % Final  . nRBC 01/27/2016 0  0 - 0 % Final  . Retic % 01/27/2016 2.19* 0.70 - 2.10 % Final  . Retic Ct Abs 01/27/2016 73.15  33.70 - 90.70 10e3/uL Final  . Immature Retic Fract 01/27/2016 11.20* 1.60 - 10.00 % Final  . Sodium 01/27/2016 138  136 - 145 mEq/L Final  . Potassium 01/27/2016 4.1  3.5 - 5.1 mEq/L Final  . Chloride 01/27/2016 107  98 - 109 mEq/L Final  . CO2 01/27/2016 25  22 - 29 mEq/L Final  . Glucose 01/27/2016 82  70 - 140 mg/dl Final  . BUN 01/27/2016 15.1  7.0 - 26.0 mg/dL Final  . Creatinine 01/27/2016 1.0  0.6 - 1.1 mg/dL Final  . Total Bilirubin 01/27/2016 0.42  0.20 - 1.20 mg/dL Final  . Alkaline Phosphatase 01/27/2016 46  40 - 150 U/L Final  . AST 01/27/2016 17  5 - 34 U/L Final  . ALT 01/27/2016 7  0 - 55 U/L Final  . Total Protein 01/27/2016 7.5  6.4 - 8.3 g/dL Final  . Albumin 01/27/2016 3.2* 3.5 - 5.0 g/dL Final  . Calcium 01/27/2016 9.0  8.4 - 10.4 mg/dL Final  . Anion Gap 01/27/2016 6  3 - 11 mEq/L Final  . EGFR 01/27/2016 52* >90 ml/min/1.73 m2 Final    RADIOGRAPHIC STUDIES: No results found.  ASSESSMENT/PLAN:    Transitional cell carcinoma of renal pelvis [facility]) Patient presents to the [facility] today to receive cycle 20 of her Tecentriq therapy.  She received her last Xgeva injection on 01/06/2016.  She will continue to receive  the Xgeva on an every 6 weeks basis.  Patient states she's feeling very well today.  She has no new complaints or issues.  She states she has no rash at this current time.  She continues with occasional, chronic rectal bleeding that has been diagnosed as chronic inflammatory proctitis in the past.  She uses proctofoam and suppositories as directed.    Labs were all within normal limits; and hemoglobin was stable today.  Patient was afebrile as well.  Patient will proceed today with her treatment as planned.  She will return on 02/17/2016 for labs, flush, visit, and her next cycle of therapy.   Patient stated understanding of all instructions; and was in agreement with this plan of care. The patient knows to call the clinic with any problems, questions or concerns.   Total time spent with patient was 15 minutes;  with greater than 75 percent of that time spent in face to face counseling regarding patient's symptoms,  and coordination of care and follow up.  Disclaimer:This dictation was prepared with Dragon/digital dictation along with [employer]. Any transcriptional errors that result from this process are unintentional.  Drue Second, NP 01/27/2016

## 2016-01-27 NOTE — Patient Instructions (Signed)
[facility] Discharge Instructions for Patients Receiving Chemotherapy  Today you received the following chemotherapy agents: Tecentriq  To help prevent nausea and vomiting after your treatment, we encourage you to take your nausea medication as directed.    If you develop nausea and vomiting that is not controlled by your nausea medication, call the clinic.   BELOW ARE SYMPTOMS THAT SHOULD BE REPORTED IMMEDIATELY:  *FEVER GREATER THAN 100.5 F  *CHILLS WITH OR WITHOUT FEVER  NAUSEA AND VOMITING THAT IS NOT CONTROLLED WITH YOUR NAUSEA MEDICATION  *UNUSUAL SHORTNESS OF BREATH  *UNUSUAL BRUISING OR BLEEDING  TENDERNESS IN MOUTH AND THROAT WITH OR WITHOUT PRESENCE OF ULCERS  *URINARY PROBLEMS  *BOWEL PROBLEMS  UNUSUAL RASH Items with * indicate a potential emergency and should be followed up as soon as possible.  Feel free to call the clinic you have any questions or concerns. The clinic phone number is [phone].  Please show the CHEMO ALERT CARD at check-in to the Emergency Department and triage nurse.   

## 2016-02-13 ENCOUNTER — Telehealth: Payer: Self-pay | Admitting: Hematology

## 2016-02-13 NOTE — Telephone Encounter (Signed)
S/w pt, advised 11/17 md appt moved to 11/16 @ 3.15 due to md pal. Pt is willing to come in on 11/16 @ 3.15 for lab/md and come back on 11/17 @ 11am for infusion.

## 2016-02-15 ENCOUNTER — Other Ambulatory Visit: Payer: Self-pay | Admitting: *Deleted

## 2016-02-15 DIAGNOSIS — C659 Malignant neoplasm of unspecified renal pelvis: Secondary | ICD-10-CM

## 2016-02-16 ENCOUNTER — Encounter: Payer: Self-pay | Admitting: Hematology

## 2016-02-16 ENCOUNTER — Ambulatory Visit (HOSPITAL_BASED_OUTPATIENT_CLINIC_OR_DEPARTMENT_OTHER): Payer: Medicare Other | Admitting: Hematology

## 2016-02-16 ENCOUNTER — Ambulatory Visit (HOSPITAL_BASED_OUTPATIENT_CLINIC_OR_DEPARTMENT_OTHER): Payer: Medicare Other

## 2016-02-16 ENCOUNTER — Other Ambulatory Visit (HOSPITAL_BASED_OUTPATIENT_CLINIC_OR_DEPARTMENT_OTHER): Payer: Medicare Other

## 2016-02-16 VITALS — BP 144/64 | HR 82 | Temp 98.3°F | Resp 18 | Wt 125.9 lb

## 2016-02-16 DIAGNOSIS — Z452 Encounter for adjustment and management of vascular access device: Secondary | ICD-10-CM

## 2016-02-16 DIAGNOSIS — C651 Malignant neoplasm of right renal pelvis: Secondary | ICD-10-CM

## 2016-02-16 DIAGNOSIS — C659 Malignant neoplasm of unspecified renal pelvis: Secondary | ICD-10-CM

## 2016-02-16 DIAGNOSIS — C7800 Secondary malignant neoplasm of unspecified lung: Secondary | ICD-10-CM | POA: Diagnosis not present

## 2016-02-16 DIAGNOSIS — C787 Secondary malignant neoplasm of liver and intrahepatic bile duct: Secondary | ICD-10-CM

## 2016-02-16 DIAGNOSIS — C778 Secondary and unspecified malignant neoplasm of lymph nodes of multiple regions: Secondary | ICD-10-CM

## 2016-02-16 DIAGNOSIS — Z95828 Presence of other vascular implants and grafts: Secondary | ICD-10-CM

## 2016-02-16 DIAGNOSIS — C7951 Secondary malignant neoplasm of bone: Secondary | ICD-10-CM | POA: Diagnosis not present

## 2016-02-16 LAB — CBC WITH DIFFERENTIAL/PLATELET
BASO%: 0.1 % (ref 0.0–2.0)
Basophils Absolute: 0 10*3/uL (ref 0.0–0.1)
EOS ABS: 0.3 10*3/uL (ref 0.0–0.5)
EOS%: 4.5 % (ref 0.0–7.0)
HCT: 30.4 % — ABNORMAL LOW (ref 34.8–46.6)
HEMOGLOBIN: 9.5 g/dL — AB (ref 11.6–15.9)
LYMPH%: 32.4 % (ref 14.0–49.7)
MCH: 29.9 pg (ref 25.1–34.0)
MCHC: 31.3 g/dL — ABNORMAL LOW (ref 31.5–36.0)
MCV: 95.6 fL (ref 79.5–101.0)
MONO#: 1.1 10*3/uL — AB (ref 0.1–0.9)
MONO%: 15.8 % — ABNORMAL HIGH (ref 0.0–14.0)
NEUT%: 47.2 % (ref 38.4–76.8)
NEUTROS ABS: 3.2 10*3/uL (ref 1.5–6.5)
PLATELETS: 299 10*3/uL (ref 145–400)
RBC: 3.18 10*6/uL — ABNORMAL LOW (ref 3.70–5.45)
RDW: 14.5 % (ref 11.2–14.5)
WBC: 6.7 10*3/uL (ref 3.9–10.3)
lymph#: 2.2 10*3/uL (ref 0.9–3.3)

## 2016-02-16 LAB — COMPREHENSIVE METABOLIC PANEL
ALBUMIN: 3 g/dL — AB (ref 3.5–5.0)
ALK PHOS: 48 U/L (ref 40–150)
ALT: 7 U/L (ref 0–55)
ANION GAP: 9 meq/L (ref 3–11)
AST: 14 U/L (ref 5–34)
BILIRUBIN TOTAL: 0.31 mg/dL (ref 0.20–1.20)
BUN: 15.8 mg/dL (ref 7.0–26.0)
CO2: 23 mEq/L (ref 22–29)
CREATININE: 1 mg/dL (ref 0.6–1.1)
Calcium: 9.3 mg/dL (ref 8.4–10.4)
Chloride: 107 mEq/L (ref 98–109)
EGFR: 48 mL/min/{1.73_m2} — AB (ref >90)
GLUCOSE: 117 mg/dL (ref 70–140)
Potassium: 4.1 mEq/L (ref 3.5–5.1)
SODIUM: 139 meq/L (ref 136–145)
TOTAL PROTEIN: 7.5 g/dL (ref 6.4–8.3)

## 2016-02-16 MED ORDER — BUDESONIDE 3 MG PO CPEP: 3.0000 mg | ORAL_CAPSULE | Freq: Every day | ORAL | 0 refills | Status: DC

## 2016-02-16 MED ORDER — HEPARIN SOD (PORK) LOCK FLUSH 100 UNIT/ML IV SOLN
500.0000 [IU] | Freq: Once | INTRAVENOUS | Status: AC | PRN
  Administered 2016-02-16: 500 [IU] via INTRAVENOUS
  Filled 2016-02-16: qty 5

## 2016-02-16 MED ORDER — SODIUM CHLORIDE 0.9 % IJ SOLN
10.0000 mL | INTRAMUSCULAR | Status: DC | PRN
  Administered 2016-02-16: 10 mL via INTRAVENOUS
  Filled 2016-02-16: qty 10

## 2016-02-17 ENCOUNTER — Ambulatory Visit (HOSPITAL_BASED_OUTPATIENT_CLINIC_OR_DEPARTMENT_OTHER): Payer: Medicare Other

## 2016-02-17 ENCOUNTER — Ambulatory Visit: Payer: Medicare Other | Admitting: Hematology

## 2016-02-17 ENCOUNTER — Telehealth: Payer: Self-pay | Admitting: Hematology

## 2016-02-17 ENCOUNTER — Other Ambulatory Visit: Payer: Medicare Other

## 2016-02-17 ENCOUNTER — Telehealth: Payer: Self-pay | Admitting: *Deleted

## 2016-02-17 VITALS — BP 139/60 | HR 79 | Temp 98.1°F | Resp 17

## 2016-02-17 DIAGNOSIS — C7951 Secondary malignant neoplasm of bone: Secondary | ICD-10-CM

## 2016-02-17 DIAGNOSIS — Z5112 Encounter for antineoplastic immunotherapy: Secondary | ICD-10-CM | POA: Diagnosis not present

## 2016-02-17 DIAGNOSIS — Z95828 Presence of other vascular implants and grafts: Secondary | ICD-10-CM

## 2016-02-17 DIAGNOSIS — C651 Malignant neoplasm of right renal pelvis: Secondary | ICD-10-CM

## 2016-02-17 DIAGNOSIS — C659 Malignant neoplasm of unspecified renal pelvis: Secondary | ICD-10-CM | POA: Diagnosis not present

## 2016-02-17 MED ORDER — SODIUM CHLORIDE 0.9 % IV SOLN
Freq: Once | INTRAVENOUS | Status: AC
  Administered 2016-02-17: 11:00:00 via INTRAVENOUS

## 2016-02-17 MED ORDER — SODIUM CHLORIDE 0.9 % IV SOLN
1200.0000 mg | Freq: Once | INTRAVENOUS | Status: AC
  Administered 2016-02-17: 1200 mg via INTRAVENOUS
  Filled 2016-02-17: qty 20

## 2016-02-17 MED ORDER — DENOSUMAB 120 MG/1.7ML ~~LOC~~ SOLN
120.0000 mg | Freq: Once | SUBCUTANEOUS | Status: AC
  Administered 2016-02-17: 120 mg via SUBCUTANEOUS
  Filled 2016-02-17: qty 1.7

## 2016-02-17 MED ORDER — SODIUM CHLORIDE 0.9 % IJ SOLN
10.0000 mL | INTRAMUSCULAR | Status: DC | PRN
  Administered 2016-02-17: 10 mL
  Filled 2016-02-17: qty 10

## 2016-02-17 MED ORDER — HEPARIN SOD (PORK) LOCK FLUSH 100 UNIT/ML IV SOLN
500.0000 [IU] | Freq: Once | INTRAVENOUS | Status: AC | PRN
  Administered 2016-02-17: 500 [IU]
  Filled 2016-02-17: qty 5

## 2016-02-17 NOTE — Telephone Encounter (Signed)
Message sent to chemo scheduler to be added per 02/17/16 los. Message was also sent to Dr Irene Limbo to see when he will be able to follow up with patient, due to him being on PAL, the week of 03/30/16. Appointments scheduled, per 02/17/16 los. A copy of the AVS report and appointment schedule was given to patient, per 02/17/16 los.

## 2016-02-17 NOTE — Patient Instructions (Signed)
[facility] Discharge Instructions for Patients Receiving Chemotherapy  Today you received the following chemotherapy agents:  atezolizumab [name]  To help prevent nausea and vomiting after your treatment, we encourage you to take your nausea medication as prescribed.   If you develop nausea and vomiting that is not controlled by your nausea medication, call the clinic.   BELOW ARE SYMPTOMS THAT SHOULD BE REPORTED IMMEDIATELY:  *FEVER GREATER THAN 100.5 F  *CHILLS WITH OR WITHOUT FEVER  NAUSEA AND VOMITING THAT IS NOT CONTROLLED WITH YOUR NAUSEA MEDICATION  *UNUSUAL SHORTNESS OF BREATH  *UNUSUAL BRUISING OR BLEEDING  TENDERNESS IN MOUTH AND THROAT WITH OR WITHOUT PRESENCE OF ULCERS  *URINARY PROBLEMS  *BOWEL PROBLEMS  UNUSUAL RASH Items with * indicate a potential emergency and should be followed up as soon as possible.  Feel free to call the clinic you have any questions or concerns. The clinic phone number is [phone].  Please show the [place] at check-in to the Emergency Department and triage nurse.

## 2016-02-17 NOTE — Telephone Encounter (Signed)
Per LOS I have scheduled appts and notified the scheduler 

## 2016-02-26 ENCOUNTER — Encounter: Payer: Self-pay | Admitting: Hematology

## 2016-02-26 NOTE — Progress Notes (Signed)
Hematology oncology clinic follow-up.  Date of service. 02/16/2016  Patient Care Team: [name], MD as PCP - General (Internal Medicine)   Urologist: Dr. 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  Diagnosis:  Widely metastatic transitional cell carcinoma from the right renal pelvis.  Treatment -Atezolizumab IV q3weeks status post 20 cycles here for her 21th cycle.  HISTORY OF PRESENTING ILLNESS: Please see my previous notes for details of initial presentation.  INTERVAL HISTORY  Tiffany [name] is here for her scheduled follow-up prior to her next cycle of Atezolizumab. She notess no acute new symptoms since her last visit.  Notes that she is still having some rectal bleeding and mild cramping which has not responded to proctofoam that her GI doctor has started on in addition to the 5ASA suppositories. She is open to the idea of trying po budesonide ER and was given a prescription for this. Breathing stable. No cp. No worsening of cough. Wt stable. No abdominal pain or hematuria.  MEDICAL HISTORY:  Past Medical History:  Diagnosis Date  . Arthritis     knees 03-10-12 had Cortisone injection  . Cancer [facility]) june 2016   metastatic  . DDD (degenerative disc disease), cervical   . Edema leg    feet and ankles  . Esophagus disorder    Had esophagus stretched  . GERD (gastroesophageal reflux disease)    Benign stricture dilated in 2011  . Gout    Patient notes she has possible gout but has not been on chronic medications for this  . Headache(784.0)   . Hypertension   . Occipital neuralgia   . Occipital neuralgia    Related to cervical degenerative disc disease  . Peptic ulcer disease    Previous history in the remote past  . Ulcerative proctitis ([place])   . 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 ABDOMINAL HYSTERECTOMY    . APPENDECTOMY    . CHOLECYSTECTOMY    . COLONOSCOPY WITH PROPOFOL  03/25/2012   Procedure: COLONOSCOPY WITH PROPOFOL;  Surgeon: Garlan Fair, MD;  Location: [facility];  Service: Endoscopy;  Laterality: N/A;  . DILATION AND CURETTAGE OF UTERUS    . ESOPHAGEAL DILATION     For benign stricture in 2011  . ESOPHAGOGASTRODUODENOSCOPY    . FLEXIBLE SIGMOIDOSCOPY N/A 04/26/2014   Procedure: FLEXIBLE SIGMOIDOSCOPY - UnSedated;  Surgeon: Garlan Fair, MD;  Location: [facility];  Service: Endoscopy;  Laterality: N/A;  . FLEXIBLE SIGMOIDOSCOPY N/A 01/24/2015   Procedure: FLEXIBLE SIGNMOIDOSCOPY W/ FLEET ENEMIA;  Surgeon: Garlan Fair, MD;  Location: [facility];  Service: Endoscopy;  Laterality: N/A;  . NECK SURGERY     20 years ago  . TOTAL ABDOMINAL HYSTERECTOMY W/ BILATERAL SALPINGOOPHORECTOMY     At age 28 due to miscarriage and retained products of conception causing significant uterine bleeding. Patient has been on estrogen replacement therapy since then.    SOCIAL HISTORY: Social History   Social History  . Marital status: Married    Spouse name: N/A  . Number of children: N/A  . Years of education: N/A   Occupational History  . Not on file.   Social History Main Topics  . Smoking status: Former Smoker    Quit date: 11/01/1983  . Smokeless tobacco: Never Used  . Alcohol use Yes  Comment: occ  . Drug use: No  . Sexual activity: No   Other Topics Concern  . Not on file   Social History Narrative  . No narrative on file  Retired as Conservation officer, nature for [employer]. She is very well spoken and intelligent individual and exhibits a keen knowledge of her medical conditions.  FAMILY HISTORY: Family History  Problem Relation Age of Onset  . Lung cancer Sister   . Prostate cancer Brother   . Uterine cancer Maternal Aunt   . Breast cancer Paternal Grandmother   . Hodgkin's  lymphoma Sister     ALLERGIES:  is allergic to nitrofurantoin; indomethacin; and lisinopril.  MEDICATIONS:  Current Outpatient Prescriptions  Medication Sig Dispense Refill  . acetaminophen (TYLENOL) 650 MG CR tablet Take 1,300 mg by mouth 2 (two) times daily.    Marland Kitchen amLODipine (NORVASC) 5 MG tablet Take 5 mg by mouth daily.    Marland Kitchen azithromycin (ZITHROMAX) 250 MG tablet Take 1 tablet by mouth daily. Last day 10/28  1  . budesonide (ENTOCORT EC) 3 MG 24 hr capsule Take 1 capsule (3 mg total) by mouth daily. If still having rectal cramping/bleeding increase to 6mg  po daily 60 capsule 0  . CANASA 1000 MG suppository Place 1,000 mg rectally daily as needed.   11  . COLCRYS 0.6 MG tablet TK 1 T PO  QD TO BID PRN ACUTE GOUT FLARE FOR 7 DAYS  1  . estradiol (ESTRACE) 0.5 MG tablet Take 0.5 mg by mouth daily.  1  . guaiFENesin (MUCINEX) 600 MG 12 hr tablet Take by mouth 2 (two) times daily.    Marland Kitchen HYDROcodone-acetaminophen (NORCO/VICODIN) 5-325 MG tablet Take 1-2 tablets by mouth every 4 (four) hours as needed for moderate pain or severe pain. (Patient not taking: Reported on 01/27/2016) 20 tablet 0  . mirtazapine (REMERON) 15 MG tablet Take 1 tablet (15 mg total) by mouth at bedtime. 90 tablet 0  . ondansetron (ZOFRAN) 4 MG tablet Take 1 tablet (4 mg total) by mouth every 8 (eight) hours as needed for nausea or vomiting. (Patient not taking: Reported on 01/27/2016) 30 tablet 0  . OVER THE COUNTER MEDICATION Apply 1 application topically. [place] dream over the counter cream for arthritis    . oxyCODONE (OXY IR/ROXICODONE) 5 MG immediate release tablet Take 0.5-1 tablets (2.5-5 mg total) by mouth every 4 (four) hours as needed for moderate pain, severe pain or breakthrough pain. (Patient not taking: Reported on 01/27/2016) 60 tablet 0  . PRESCRIPTION MEDICATION Chemo card    . PROCTOFOAM HC rectal foam INSTILL 1 APPLICATORFUL RECTALLY EACH EVENING  12  . Respiratory Therapy Supplies (FLUTTER) DEVI 1 each  by Does not apply route daily. 1 each 0  . senna-docusate (SENNA S) 8.6-50 MG per tablet Take 2 tablets by mouth at bedtime. 60 tablet 1  . triamcinolone ointment (KENALOG) 0.5 % Apply 1 application topically 2 (two) times daily. (Patient taking differently: Apply 1 application topically 2 (two) times daily as needed (for rash). ) 30 g 0   No current facility-administered medications for this visit.    REVIEW OF SYSTEMS:    10 point review of systems done and is negative except as noted above  PHYSICAL EXAMINATION: ECOG PERFORMANCE STATUS: 1  Vitals:   02/16/16 1511  BP: (!) 144/64  Pulse: 82  Resp: 18  Temp: 98.3 F (36.8 C)   Filed Weights   02/16/16 1511  Weight: 125 lb 14.4 oz (57.1  kg)   GENERAL elderly Caucasian female, alert, no distress and comfortable SKIN: resolved skin rashes EYES: normal, conjunctiva are pink and non-injected, sclera clear OROPHARYNX:no exudate, no erythema and lips, buccal mucosa, and tongue normal  NECK: supple, thyroid normal size, non-tender, without nodularity LYMPH:  no palpable lymphadenopathy in the cervical, axillary or inguinal LUNGS: few bibasilar rales, scattered rhonci, good air entry. HEART: regular rate & rhythm and no murmurs and no lower extremity edema ABDOMEN:abdomen soft, non-tender and normal bowel sounds Musculoskeletal:no cyanosis of digits and no clubbing  PSYCH: alert & oriented x 3 with fluent speech NEURO: no focal motor/sensory deficits  LABORATORY DATA:  CBC Latest Ref Rng & Units 02/16/2016 01/27/2016 01/06/2016  WBC 3.9 - 10.3 10e3/uL 6.7 6.5 5.9  Hemoglobin 11.6 - 15.9 g/dL 9.5(L) 10.2(L) 10.3(L)  Hematocrit 34.8 - 46.6 % 30.4(L) 32.1(L) 32.4(L)  Platelets 145 - 400 10e3/uL 299 271 271   . CMP Latest Ref Rng & Units 02/16/2016 01/27/2016 01/06/2016  Glucose 70 - 140 mg/dl 117 82 73  BUN 7.0 - 26.0 mg/dL 15.8 15.1 12.3  Creatinine 0.6 - 1.1 mg/dL 1.0 1.0 1.1  Sodium 136 - 145 mEq/L 139 138 140  Potassium 3.5  - 5.1 mEq/L 4.1 4.1 4.4  Chloride 101 - 111 mmol/L - - -  CO2 22 - 29 mEq/L 23 25 24   Calcium 8.4 - 10.4 mg/dL 9.3 9.0 9.3  Total Protein 6.4 - 8.3 g/dL 7.5 7.5 7.6  Total Bilirubin 0.20 - 1.20 mg/dL 0.31 0.42 0.42  Alkaline Phos 40 - 150 U/L 48 46 45  AST 5 - 34 U/L 14 17 18   ALT 0 - 55 U/L 7 7 <9   Radiology .No results found.  ASSESSMENT & PLAN:   82 year old Caucasian female with  #1 Right Renal pelvis metastatic transitional cell carcinoma.  PET scan revealed metastases to the lungs, liver, multiple nodal stations and bones. She is status post 20 cycles of Atezolizumab. PET scan done after 8 cycles of treatment for restaging disease showed significant response to treatment with no evidence of active disease. Patient has no clinical evidence of disease progression at this time. CT chest 06/16/2015 showed no evidence of cancer progression. PET/CT scan done on 10/21/2015- showed no findings for metastatic disease. New right-sided hydronephrosis versus local tumor recurrence needs to be evaluated further. MRI of the kidney showed hyperenhancing the right renal pelvis lesion consistent with concern for local recurrence.  -Patient was recently evaluated by urology for this and deemed not to be a candidate for surgery for local disease control or any other intervention at this time.  -Patient has been seen by radiation oncology and has had input from them regarding the potential role of RT if her rt renal pelvis lesions were to become symptomatic.  #2 h/o skin rash likely related to her Atezolizumab. Minimal grade 1 and topical triamcinolone . Currently quiescent. #3 Cough and some clear secretions likely due to bronchiectasis - stable symptoms. No chest pain no increased shortness of breath or dyspnea on exertion . #4 minimal rectal bleeding due to inflammatory proctitis on 5-ASA suppositories and proctofoam as per Dr. Marthe Patch Plan  --labs stable.  Patient has no new treatment  toxicities.  No clinical symptoms suggestive of overt disease recurrence at this time. -We'll continue her Atezolizumab given that her Broader cancer is still under control. -Continue Xgeva every 6 weeks -Continue triamcinolone ointment for her skin rash as needed. (Has not needed this for the last few months). -Continue  mesalamine suppositories and proctofoam as per Dr. Hassell Done for ulcerative proctitis. She was given a prescription for budesnoide ER --will taper this down once symptoms improve -breathing has been stable. Has f/u with Dr Melvyn Novas. Has not needed to use any prednisone recently. -rpt PET/CT prior to next cycle of treatment to re-evaluate response. Cannot perform CT with contrast due to CKD  #4 history of hypertension   -on Amlodipine   RTC with Dr Irene Limbo in 3 weeks prior to next cycle of treatment with rpt labs and PET/CT   Sullivan Lone MD Tiffany Hematology/Oncology Physician [facility]  (Office):       [phone] (Work cell):  [phone] (Fax):           [phone]

## 2016-02-29 DIAGNOSIS — K5120 Ulcerative (chronic) proctitis without complications: Secondary | ICD-10-CM | POA: Diagnosis not present

## 2016-02-29 DIAGNOSIS — C689 Malignant neoplasm of urinary organ, unspecified: Secondary | ICD-10-CM | POA: Diagnosis not present

## 2016-02-29 DIAGNOSIS — Z0000 Encounter for general adult medical examination without abnormal findings: Secondary | ICD-10-CM | POA: Diagnosis not present

## 2016-02-29 DIAGNOSIS — I10 Essential (primary) hypertension: Secondary | ICD-10-CM | POA: Diagnosis not present

## 2016-02-29 DIAGNOSIS — J479 Bronchiectasis, uncomplicated: Secondary | ICD-10-CM | POA: Diagnosis not present

## 2016-02-29 DIAGNOSIS — M8588 Other specified disorders of bone density and structure, other site: Secondary | ICD-10-CM | POA: Diagnosis not present

## 2016-02-29 DIAGNOSIS — Z1389 Encounter for screening for other disorder: Secondary | ICD-10-CM | POA: Diagnosis not present

## 2016-03-06 ENCOUNTER — Encounter (HOSPITAL_COMMUNITY)
Admission: RE | Admit: 2016-03-06 | Discharge: 2016-03-06 | Disposition: A | Discharge status: Home / Self Care | Payer: Medicare Other | Source: Ambulatory Visit | Attending: Hematology | Admitting: Hematology

## 2016-03-06 ENCOUNTER — Telehealth: Payer: Self-pay | Admitting: Internal Medicine

## 2016-03-06 DIAGNOSIS — C787 Secondary malignant neoplasm of liver and intrahepatic bile duct: Secondary | ICD-10-CM | POA: Insufficient documentation

## 2016-03-06 DIAGNOSIS — C7951 Secondary malignant neoplasm of bone: Secondary | ICD-10-CM | POA: Diagnosis not present

## 2016-03-06 DIAGNOSIS — C659 Malignant neoplasm of unspecified renal pelvis: Secondary | ICD-10-CM | POA: Diagnosis not present

## 2016-03-06 LAB — GLUCOSE, CAPILLARY: GLUCOSE-CAPILLARY: 87 mg/dL (ref 65–99)

## 2016-03-06 MED ORDER — FLUDEOXYGLUCOSE F - 18 (FDG) INJECTION
6.1700 | Freq: Once | INTRAVENOUS | Status: AC | PRN
  Administered 2016-03-06: 6.17 via INTRAVENOUS

## 2016-03-06 NOTE — Telephone Encounter (Signed)
Spoke with pt and she stated she received a letter that stated she was approved for the poc. And she was unsure as to why we ordered it. Informed the pt. That I do not see where someone from our office sent the order that she should follow up with them and find out who placed the order. [facility] number was placed. Nothing further is needed

## 2016-03-07 ENCOUNTER — Other Ambulatory Visit: Payer: Self-pay

## 2016-03-07 DIAGNOSIS — C659 Malignant neoplasm of unspecified renal pelvis: Secondary | ICD-10-CM

## 2016-03-08 ENCOUNTER — Ambulatory Visit: Payer: Medicare Other

## 2016-03-08 ENCOUNTER — Encounter: Payer: Self-pay | Admitting: Hematology

## 2016-03-08 ENCOUNTER — Ambulatory Visit (HOSPITAL_BASED_OUTPATIENT_CLINIC_OR_DEPARTMENT_OTHER): Payer: Medicare Other | Admitting: Hematology

## 2016-03-08 ENCOUNTER — Other Ambulatory Visit: Payer: Self-pay | Admitting: *Deleted

## 2016-03-08 ENCOUNTER — Other Ambulatory Visit (HOSPITAL_BASED_OUTPATIENT_CLINIC_OR_DEPARTMENT_OTHER): Payer: Medicare Other

## 2016-03-08 ENCOUNTER — Ambulatory Visit (HOSPITAL_BASED_OUTPATIENT_CLINIC_OR_DEPARTMENT_OTHER): Payer: Medicare Other

## 2016-03-08 VITALS — BP 150/62 | HR 72 | Temp 98.2°F | Resp 18 | Ht 61.0 in | Wt 125.1 lb

## 2016-03-08 DIAGNOSIS — Z5112 Encounter for antineoplastic immunotherapy: Secondary | ICD-10-CM | POA: Diagnosis not present

## 2016-03-08 DIAGNOSIS — C778 Secondary and unspecified malignant neoplasm of lymph nodes of multiple regions: Secondary | ICD-10-CM | POA: Diagnosis not present

## 2016-03-08 DIAGNOSIS — C651 Malignant neoplasm of right renal pelvis: Secondary | ICD-10-CM

## 2016-03-08 DIAGNOSIS — C7800 Secondary malignant neoplasm of unspecified lung: Secondary | ICD-10-CM

## 2016-03-08 DIAGNOSIS — C787 Secondary malignant neoplasm of liver and intrahepatic bile duct: Secondary | ICD-10-CM

## 2016-03-08 DIAGNOSIS — Z95828 Presence of other vascular implants and grafts: Secondary | ICD-10-CM

## 2016-03-08 DIAGNOSIS — Z79899 Other long term (current) drug therapy: Secondary | ICD-10-CM | POA: Diagnosis not present

## 2016-03-08 DIAGNOSIS — C659 Malignant neoplasm of unspecified renal pelvis: Secondary | ICD-10-CM

## 2016-03-08 DIAGNOSIS — C7951 Secondary malignant neoplasm of bone: Secondary | ICD-10-CM

## 2016-03-08 DIAGNOSIS — K6289 Other specified diseases of anus and rectum: Secondary | ICD-10-CM

## 2016-03-08 LAB — CBC & DIFF AND RETIC
BASO%: 0.2 % (ref 0.0–2.0)
BASOS ABS: 0 10*3/uL (ref 0.0–0.1)
EOS ABS: 0.2 10*3/uL (ref 0.0–0.5)
EOS%: 4.2 % (ref 0.0–7.0)
HEMATOCRIT: 32.1 % — AB (ref 34.8–46.6)
HEMOGLOBIN: 10 g/dL — AB (ref 11.6–15.9)
IMMATURE RETIC FRACT: 11.1 % — AB (ref 1.60–10.00)
LYMPH%: 38.8 % (ref 14.0–49.7)
MCH: 29.8 pg (ref 25.1–34.0)
MCHC: 31.2 g/dL — ABNORMAL LOW (ref 31.5–36.0)
MCV: 95.5 fL (ref 79.5–101.0)
MONO#: 0.8 10*3/uL (ref 0.1–0.9)
MONO%: 14.1 % — AB (ref 0.0–14.0)
NEUT%: 42.7 % (ref 38.4–76.8)
NEUTROS ABS: 2.3 10*3/uL (ref 1.5–6.5)
PLATELETS: 258 10*3/uL (ref 145–400)
RBC: 3.36 10*6/uL — ABNORMAL LOW (ref 3.70–5.45)
RDW: 14.7 % — ABNORMAL HIGH (ref 11.2–14.5)
Retic %: 2.05 % (ref 0.70–2.10)
Retic Ct Abs: 68.88 10*3/uL (ref 33.70–90.70)
WBC: 5.5 10*3/uL (ref 3.9–10.3)
lymph#: 2.1 10*3/uL (ref 0.9–3.3)

## 2016-03-08 LAB — COMPREHENSIVE METABOLIC PANEL
ALBUMIN: 3.2 g/dL — AB (ref 3.5–5.0)
ALK PHOS: 45 U/L (ref 40–150)
ALT: <6 U/L (ref 0–55)
ANION GAP: 9 meq/L (ref 3–11)
AST: 19 U/L (ref 5–34)
BILIRUBIN TOTAL: 0.37 mg/dL (ref 0.20–1.20)
BUN: 16.8 mg/dL (ref 7.0–26.0)
CALCIUM: 9.1 mg/dL (ref 8.4–10.4)
CO2: 24 mEq/L (ref 22–29)
Chloride: 108 mEq/L (ref 98–109)
Creatinine: 1.1 mg/dL (ref 0.6–1.1)
EGFR: 42 mL/min/{1.73_m2} — AB (ref >90)
GLUCOSE: 89 mg/dL (ref 70–140)
POTASSIUM: 4.1 meq/L (ref 3.5–5.1)
Sodium: 140 mEq/L (ref 136–145)
TOTAL PROTEIN: 7.7 g/dL (ref 6.4–8.3)

## 2016-03-08 LAB — TSH: TSH: 0.869 m(IU)/L (ref 0.308–3.960)

## 2016-03-08 LAB — LACTATE DEHYDROGENASE: LDH: 256 U/L — AB (ref 125–245)

## 2016-03-08 MED ORDER — HEPARIN SOD (PORK) LOCK FLUSH 100 UNIT/ML IV SOLN
500.0000 [IU] | Freq: Once | INTRAVENOUS | Status: AC | PRN
  Administered 2016-03-08: 500 [IU]
  Filled 2016-03-08: qty 5

## 2016-03-08 MED ORDER — SODIUM CHLORIDE 0.9 % IV SOLN
1200.0000 mg | Freq: Once | INTRAVENOUS | Status: AC
  Administered 2016-03-08: 1200 mg via INTRAVENOUS
  Filled 2016-03-08: qty 20

## 2016-03-08 MED ORDER — SODIUM CHLORIDE 0.9 % IJ SOLN
10.0000 mL | INTRAMUSCULAR | Status: DC | PRN
  Administered 2016-03-08: 10 mL via INTRAVENOUS
  Filled 2016-03-08: qty 10

## 2016-03-08 MED ORDER — SODIUM CHLORIDE 0.9 % IJ SOLN
10.0000 mL | INTRAMUSCULAR | Status: DC | PRN
  Administered 2016-03-08: 10 mL
  Filled 2016-03-08: qty 10

## 2016-03-08 MED ORDER — TRIAMCINOLONE ACETONIDE 0.5 % EX OINT
1.0000 | TOPICAL_OINTMENT | Freq: Two times a day (BID) | CUTANEOUS | 0 refills | Status: DC
Start: 2016-03-08 — End: 2016-12-28

## 2016-03-08 MED ORDER — SODIUM CHLORIDE 0.9 % IV SOLN
Freq: Once | INTRAVENOUS | Status: AC
  Administered 2016-03-08: 12:00:00 via INTRAVENOUS

## 2016-03-08 NOTE — Patient Instructions (Signed)
Tiffany [name] Discharge Instructions for Patients Receiving Chemotherapy  Today you received the following chemotherapy agents:  atezolizumab [name]  To help prevent nausea and vomiting after your treatment, we encourage you to take your nausea medication as prescribed.   If you develop nausea and vomiting that is not controlled by your nausea medication, call the clinic.   BELOW ARE SYMPTOMS THAT SHOULD BE REPORTED IMMEDIATELY:  *FEVER GREATER THAN 100.5 F  *CHILLS WITH OR WITHOUT FEVER  NAUSEA AND VOMITING THAT IS NOT CONTROLLED WITH YOUR NAUSEA MEDICATION  *UNUSUAL SHORTNESS OF BREATH  *UNUSUAL BRUISING OR BLEEDING  TENDERNESS IN MOUTH AND THROAT WITH OR WITHOUT PRESENCE OF ULCERS  *URINARY PROBLEMS  *BOWEL PROBLEMS  UNUSUAL RASH Items with * indicate a potential emergency and should be followed up as soon as possible.  Feel free to call the clinic you have any questions or concerns. The clinic phone number is [phone].  Please show the [place] at check-in to the Emergency Department and triage nurse.

## 2016-03-08 NOTE — Patient Instructions (Signed)

## 2016-03-08 NOTE — Patient Instructions (Signed)
-  Reduce the budesonide dose to one tablet every other day for 10 days then every third day for 10 days then stop.

## 2016-03-12 NOTE — Progress Notes (Signed)
Hematology oncology clinic follow-up.  Date of service. 03/08/2016  Patient Care Team: [name], MD as PCP - General (Internal Medicine)   Urologist: Dr. 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  Diagnosis:  Widely metastatic transitional cell carcinoma from the right renal pelvis.  Treatment -Atezolizumab IV q3weeks status post 21 cycles here for her 22th cycle.  HISTORY OF PRESENTING ILLNESS: Please see my previous notes for details of initial presentation.  INTERVAL HISTORY  Tiffany [name] is here for her scheduled follow-up prior to her next cycle of Atezolizumab. She notess no acute new symptoms since her last visit.  She notes that her rectal bleeding and abdominal cramping have resolved since being on the oral budesonide. We discussed taking down the oral budesonide. Her repeat PET scan shows no progression of metastatic disease. Noted to have right-sided hydronephrosis which is asymptomatic at this time. No new skin rashes. Had a good Thanksgiving. Has some chronic cough but no fevers or chills.  MEDICAL HISTORY:  Past Medical History:  Diagnosis Date  . Arthritis     knees 03-10-12 had Cortisone injection  . Cancer [facility]) june 2016   metastatic  . DDD (degenerative disc disease), cervical   . Edema leg    feet and ankles  . Esophagus disorder    Had esophagus stretched  . GERD (gastroesophageal reflux disease)    Benign stricture dilated in 2011  . Gout    Patient notes she has possible gout but has not been on chronic medications for this  . Headache(784.0)   . Hypertension   . Occipital neuralgia   . Occipital neuralgia    Related to cervical degenerative disc disease  . Peptic ulcer disease    Previous history in the remote past  . Ulcerative proctitis ([place])   . Ulcerative proctitis ([place]) 2014     SURGICAL HISTORY: Past Surgical History:  Procedure Laterality Date  . ABDOMINAL HYSTERECTOMY    . APPENDECTOMY    . CHOLECYSTECTOMY    . COLONOSCOPY WITH PROPOFOL  03/25/2012   Procedure: COLONOSCOPY WITH PROPOFOL;  Surgeon: Garlan Fair, MD;  Location: [facility];  Service: Endoscopy;  Laterality: N/A;  . DILATION AND CURETTAGE OF UTERUS    . ESOPHAGEAL DILATION     For benign stricture in 2011  . ESOPHAGOGASTRODUODENOSCOPY    . FLEXIBLE SIGMOIDOSCOPY N/A 04/26/2014   Procedure: FLEXIBLE SIGMOIDOSCOPY - UnSedated;  Surgeon: Garlan Fair, MD;  [facility];  Service: Endoscopy;  Laterality: N/A;  . FLEXIBLE SIGMOIDOSCOPY N/A 01/24/2015   Procedure: FLEXIBLE SIGNMOIDOSCOPY W/ FLEET ENEMIA;  Surgeon: Garlan Fair, MD;  Location[facility];  Service: Endoscopy;  Laterality: N/A;  . NECK SURGERY     20 years ago  . TOTAL ABDOMINAL HYSTERECTOMY W/ BILATERAL SALPINGOOPHORECTOMY     At age 55 due to miscarriage and retained products of conception causing significant uterine bleeding. Patient has been on estrogen replacement therapy since then.    SOCIAL HISTORY: Social History   Social History  . Marital status: Married    Spouse name: N/A  . Number of children: N/A  . Years of education: N/A   Occupational History  . Not on file.   Social History Main Topics  . Smoking status: Former Smoker    Quit date: 11/01/1983  . Smokeless tobacco: Never Used  . Alcohol use Yes  Comment: occ  . Drug use: No  . Sexual activity: No   Other Topics Concern  . Not on file   Social History Narrative  . No narrative on file  Retired as Conservation officer, nature for [employer]. She is very well spoken and intelligent individual and exhibits a keen knowledge of her medical conditions.  FAMILY HISTORY: Family History  Problem Relation Age of Onset  . Lung cancer Sister   . Prostate cancer Brother   . Uterine cancer Maternal Aunt   . Breast cancer  Paternal Grandmother   . Hodgkin's lymphoma Sister     ALLERGIES:  is allergic to nitrofurantoin; indomethacin; and lisinopril.  MEDICATIONS:  Current Outpatient Prescriptions  Medication Sig Dispense Refill  . acetaminophen (TYLENOL) 650 MG CR tablet Take 1,300 mg by mouth 2 (two) times daily.    [place] Kitchen amLODipine (NORVASC) 5 MG tablet Take 5 mg by mouth daily.    Marland Kitchen azithromycin (ZITHROMAX) 250 MG tablet Take 1 tablet by mouth daily. Last day 10/28  1  . budesonide (ENTOCORT EC) 3 MG 24 hr capsule Take 1 capsule (3 mg total) by mouth daily. If still having rectal cramping/bleeding increase to 6mg  po daily 60 capsule 0  . CANASA 1000 MG suppository Place 1,000 mg rectally daily as needed.   11  . COLCRYS 0.6 MG tablet TK 1 T PO  QD TO BID PRN ACUTE GOUT FLARE FOR 7 DAYS  1  . estradiol (ESTRACE) 0.5 MG tablet Take 0.5 mg by mouth daily.  1  . guaiFENesin (MUCINEX) 600 MG 12 hr tablet Take by mouth 2 (two) times daily.    . mirtazapine (REMERON) 15 MG tablet Take 1 tablet (15 mg total) by mouth at bedtime. 90 tablet 0  . ondansetron (ZOFRAN) 4 MG tablet Take 1 tablet (4 mg total) by mouth every 8 (eight) hours as needed for nausea or vomiting. (Patient not taking: Reported on 01/27/2016) 30 tablet 0  . OVER THE COUNTER MEDICATION Apply 1 application topically. [place] dream over the counter cream for arthritis    . oxyCODONE (OXY IR/ROXICODONE) 5 MG immediate release tablet Take 0.5-1 tablets (2.5-5 mg total) by mouth every 4 (four) hours as needed for moderate pain, severe pain or breakthrough pain. (Patient not taking: Reported on 01/27/2016) 60 tablet 0  . PRESCRIPTION MEDICATION Chemo card    . PROCTOFOAM HC rectal foam INSTILL 1 APPLICATORFUL RECTALLY EACH EVENING  12  . Respiratory Therapy Supplies (FLUTTER) DEVI 1 each by Does not apply route daily. 1 each 0  . senna-docusate (SENNA S) 8.6-50 MG per tablet Take 2 tablets by mouth at bedtime. 60 tablet 1  . triamcinolone ointment  (KENALOG) 0.5 % Apply 1 application topically 2 (two) times daily. 30 g 0   No current facility-administered medications for this visit.    REVIEW OF SYSTEMS:    10 point review of systems done and is negative except as noted above  PHYSICAL EXAMINATION: ECOG PERFORMANCE STATUS: 1  Vitals:   03/08/16 1042  BP: (!) 150/62  Pulse: 72  Resp: 18  Temp: 98.2 F (36.8 C)   Filed Weights   03/08/16 1042  Weight: 125 lb 1.6 oz (56.7 kg)   GENERAL elderly Caucasian female, alert, no distress and comfortable SKIN: resolved skin rashes EYES: normal, conjunctiva are pink and non-injected, sclera clear OROPHARYNX:no exudate, no erythema and lips, buccal mucosa, and tongue normal  NECK: supple, thyroid normal size, non-tender, without nodularity LYMPH:  no palpable  lymphadenopathy in the cervical, axillary or inguinal LUNGS: few bibasilar rales, scattered rhonci, good air entry. HEART: regular rate & rhythm and no murmurs and no lower extremity edema ABDOMEN:abdomen soft, non-tender and normal bowel sounds Musculoskeletal:no cyanosis of digits and no clubbing  PSYCH: alert & oriented x 3 with fluent speech NEURO: no focal motor/sensory deficits  LABORATORY DATA:  CBC Latest Ref Rng & Units 03/08/2016 02/16/2016 01/27/2016  WBC 3.9 - 10.3 10e3/uL 5.5 6.7 6.5  Hemoglobin 11.6 - 15.9 g/dL 10.0(L) 9.5(L) 10.2(L)  Hematocrit 34.8 - 46.6 % 32.1(L) 30.4(L) 32.1(L)  Platelets 145 - 400 10e3/uL 258 299 271   . CMP Latest Ref Rng & Units 03/08/2016 02/16/2016 01/27/2016  Glucose 70 - 140 mg/dl 89 117 82  BUN 7.0 - 26.0 mg/dL 16.8 15.8 15.1  Creatinine 0.6 - 1.1 mg/dL 1.1 1.0 1.0  Sodium 136 - 145 mEq/L 140 139 138  Potassium 3.5 - 5.1 mEq/L 4.1 4.1 4.1  Chloride 101 - 111 mmol/L - - -  CO2 22 - 29 mEq/L 24 23 25   Calcium 8.4 - 10.4 mg/dL 9.1 9.3 9.0  Total Protein 6.4 - 8.3 g/dL 7.7 7.5 7.5  Total Bilirubin 0.20 - 1.20 mg/dL 0.37 0.31 0.42  Alkaline Phos 40 - 150 U/L 45 48 46  AST 5 -  34 U/L 19 14 17   ALT 0-55 U/L U/L 6 7 7    Radiology .Nm Pet Image Restag (ps) Skull Base To Thigh  Result Date: 03/06/2016 CLINICAL DATA:  Subsequent treatment strategy for transitional cell carcinoma of the renal pelvis. EXAM: NUCLEAR MEDICINE PET SKULL BASE TO THIGH TECHNIQUE: 6.2 mCi F-18 FDG was injected intravenously. Full-ring PET imaging was performed from the skull base to thigh after the radiotracer. CT data was obtained and used for attenuation correction and anatomic localization. FASTING BLOOD GLUCOSE:  Value: 87 mg/dl COMPARISON:  10/21/2015 FINDINGS: NECK No hypermetabolic lymph nodes in the neck. CHEST No hypermetabolic mediastinal or hilar nodes. No suspicious pulmonary nodules on the CT scan. Airway thickening and airway plugging in both lower lobes with cylindrical bronchiectasis. Coronary, aortic arch, and branch vessel atherosclerotic vascular disease. ABDOMEN/PELVIS No abnormal hypermetabolic activity within the liver, pancreas, adrenal glands, or spleen. No hypermetabolic lymph nodes in the abdomen or pelvis. There is abnormal fullness of the right collecting system and effacement of adipose tissue along the renal hilum, as before, with mild fullness of the right proximal ureter. There is very high activity in this vicinity due to excreted urine, and certainly residual tumor is not readily excluded. No obvious tumor along the urinary bladder. Aortoiliac atherosclerotic vascular disease. Cholecystectomy. Physiologic activity in the colon. There is some scattered air-fluid levels in nondilated loops of small bowel. Suspected pelvic floor laxity. SKELETON No focal hypermetabolic activity to suggest skeletal metastasis. Old healed left lower rib and left lumbar transverse process fractures. Lower lumbar spondylosis and degenerative disc disease with considerable anterolisthesis suspected at the L4-5 level and significant associated impingement. IMPRESSION: 1. No findings of metastatic  disease. 2. Extensive right hydronephrosis with high activity from excreted FDG in the right collecting system obscuring any findings along the collecting system. 3. Airway thickening and airway plugging in both lower lobes with cylindrical bronchiectasis. 4. Coronary, aortic arch, and branch vessel atherosclerotic vascular disease. Aortoiliac atherosclerotic vascular disease. 5. Pelvic floor laxity. 6. Considerable anterolisthesis at L4-5 with suspected impingement. Electronically Signed   By: Van Clines M.D.   On: 03/06/2016 15:46    ASSESSMENT & PLAN:   82 year old  Caucasian female with  #1 Right Renal pelvis metastatic transitional cell carcinoma.  PET scan revealed metastases to the lungs, liver, multiple nodal stations and bones. She is status post 21 cycles of Atezolizumab. PET scan done after 8 cycles of treatment for restaging disease showed significant response to treatment with no evidence of active disease. Patient has no clinical evidence of disease progression at this time. CT chest 06/16/2015 showed no evidence of cancer progression. PET/CT scan done on 10/21/2015- showed no findings for metastatic disease. New right-sided hydronephrosis versus local tumor recurrence needs to be evaluated further. MRI of the kidney showed hyperenhancing the right renal pelvis lesion consistent with concern for local recurrence.  -Patient was recently evaluated by urology for this and deemed not to be a candidate for surgery for local disease control or any other intervention at this time.  -Patient has been seen by radiation oncology and has had input from them regarding the potential role of RT if her rt renal pelvis lesions were to become symptomatic.  PET/CT 03/06/2016 with no findings of metastatic disease. Extensive right-sided hydronephrosis with high activity from excreted FDG obscuring any findings along the collecting system.  #2 h/o skin rash likely related to her Atezolizumab.  Minimal grade 1 and topical triamcinolone . Currently quiescent. #3 Cough and some clear secretions likely due to bronchiectasis - stable symptoms. No chest pain no increased shortness of breath or dyspnea on exertion . #4 minimal rectal bleeding due to inflammatory proctitis on 5-ASA suppositories and proctofoam as per Dr. Wynetta Emery.  Her rectal symptoms are more bothersome than the last time and she was started on oral budesonide with resolution of symptoms. Plan  --labs stable.  Patient has no new treatment toxicities.  No clinical symptoms suggestive of overt disease recurrence/progression at this time. -We'll continue her Atezolizumab  -Continue Xgeva every 6 weeks -Continue triamcinolone ointment for her skin rash as needed. (Has not needed this for the last few months). -Continue mesalamine suppositories for ulcerative proctitis.We will taper her oral budesnoide ER - from 9 mg daily to 9 mg every other day for 7-10 days then 9mg  every third day for 7-10 days and stop. -breathing has been stable. Has increased secretions from her bronchiectasis.  #4 history of hypertension   -on Amlodipine   RTC with NP in 3 weeks with labs prior to next cycle of treatment (I shall be out of town) Return to care with Dr. Irene Limbo in 6 weeks with labs. Earlier if any new concerns or questions.   Sullivan Lone MD [place] Hematology/Oncology Physician [facility]  (Office):       [phone] (Work cell):  [phone] (Fax):           [phone]

## 2016-03-22 ENCOUNTER — Telehealth: Payer: Self-pay | Admitting: Hematology

## 2016-03-22 NOTE — Telephone Encounter (Signed)
Added appointments for 1/9 - spoke with patient and patient will get new schedule 12/29.

## 2016-03-30 ENCOUNTER — Telehealth: Payer: Self-pay | Admitting: Nurse Practitioner

## 2016-03-30 ENCOUNTER — Encounter: Payer: Self-pay | Admitting: Nurse Practitioner

## 2016-03-30 ENCOUNTER — Other Ambulatory Visit (HOSPITAL_BASED_OUTPATIENT_CLINIC_OR_DEPARTMENT_OTHER): Payer: Medicare Other

## 2016-03-30 ENCOUNTER — Ambulatory Visit (HOSPITAL_BASED_OUTPATIENT_CLINIC_OR_DEPARTMENT_OTHER): Payer: Medicare Other | Admitting: Nurse Practitioner

## 2016-03-30 ENCOUNTER — Ambulatory Visit (HOSPITAL_BASED_OUTPATIENT_CLINIC_OR_DEPARTMENT_OTHER): Payer: Medicare Other

## 2016-03-30 VITALS — BP 157/72 | HR 76 | Temp 97.6°F | Resp 18 | Ht 61.0 in | Wt 124.9 lb

## 2016-03-30 DIAGNOSIS — C787 Secondary malignant neoplasm of liver and intrahepatic bile duct: Secondary | ICD-10-CM

## 2016-03-30 DIAGNOSIS — C659 Malignant neoplasm of unspecified renal pelvis: Secondary | ICD-10-CM

## 2016-03-30 DIAGNOSIS — Z5112 Encounter for antineoplastic immunotherapy: Secondary | ICD-10-CM

## 2016-03-30 DIAGNOSIS — C7951 Secondary malignant neoplasm of bone: Secondary | ICD-10-CM | POA: Diagnosis not present

## 2016-03-30 DIAGNOSIS — Z95828 Presence of other vascular implants and grafts: Secondary | ICD-10-CM

## 2016-03-30 DIAGNOSIS — C651 Malignant neoplasm of right renal pelvis: Secondary | ICD-10-CM

## 2016-03-30 LAB — CBC & DIFF AND RETIC
BASO%: 0.2 % (ref 0.0–2.0)
BASOS ABS: 0 10*3/uL (ref 0.0–0.1)
EOS ABS: 0.1 10*3/uL (ref 0.0–0.5)
EOS%: 2.2 % (ref 0.0–7.0)
HEMATOCRIT: 32.7 % — AB (ref 34.8–46.6)
HEMOGLOBIN: 10.3 g/dL — AB (ref 11.6–15.9)
Immature Retic Fract: 15 % — ABNORMAL HIGH (ref 1.60–10.00)
LYMPH%: 31.9 % (ref 14.0–49.7)
MCH: 29.6 pg (ref 25.1–34.0)
MCHC: 31.5 g/dL (ref 31.5–36.0)
MCV: 94 fL (ref 79.5–101.0)
MONO#: 0.7 10*3/uL (ref 0.1–0.9)
MONO%: 12.6 % (ref 0.0–14.0)
NEUT#: 3.1 10*3/uL (ref 1.5–6.5)
NEUT%: 53.1 % (ref 38.4–76.8)
PLATELETS: 307 10*3/uL (ref 145–400)
RBC: 3.48 10*6/uL — ABNORMAL LOW (ref 3.70–5.45)
RDW: 14.5 % (ref 11.2–14.5)
RETIC %: 2.01 % (ref 0.70–2.10)
RETIC CT ABS: 69.95 10*3/uL (ref 33.70–90.70)
WBC: 5.9 10*3/uL (ref 3.9–10.3)
lymph#: 1.9 10*3/uL (ref 0.9–3.3)

## 2016-03-30 LAB — COMPREHENSIVE METABOLIC PANEL
ALBUMIN: 3.4 g/dL — AB (ref 3.5–5.0)
ALK PHOS: 47 U/L (ref 40–150)
ALT: 8 U/L (ref 0–55)
ANION GAP: 9 meq/L (ref 3–11)
AST: 18 U/L (ref 5–34)
BUN: 18.4 mg/dL (ref 7.0–26.0)
CALCIUM: 9.1 mg/dL (ref 8.4–10.4)
CHLORIDE: 106 meq/L (ref 98–109)
CO2: 23 mEq/L (ref 22–29)
CREATININE: 1 mg/dL (ref 0.6–1.1)
EGFR: 48 mL/min/{1.73_m2} — ABNORMAL LOW (ref >90)
Glucose: 87 mg/dl (ref 70–140)
Potassium: 4.2 mEq/L (ref 3.5–5.1)
Sodium: 139 mEq/L (ref 136–145)
Total Bilirubin: 0.33 mg/dL (ref 0.20–1.20)
Total Protein: 7.9 g/dL (ref 6.4–8.3)

## 2016-03-30 MED ORDER — SODIUM CHLORIDE 0.9 % IJ SOLN
10.0000 mL | INTRAMUSCULAR | Status: DC | PRN
  Administered 2016-03-30: 10 mL
  Filled 2016-03-30: qty 10

## 2016-03-30 MED ORDER — SODIUM CHLORIDE 0.9 % IJ SOLN
10.0000 mL | Freq: Once | INTRAMUSCULAR | Status: AC
  Administered 2016-03-30: 10 mL
  Filled 2016-03-30: qty 10

## 2016-03-30 MED ORDER — SODIUM CHLORIDE 0.9 % IV SOLN
Freq: Once | INTRAVENOUS | Status: AC
Start: 2016-03-30 — End: 2016-03-30
  Administered 2016-03-30: 13:00:00 via INTRAVENOUS

## 2016-03-30 MED ORDER — DENOSUMAB 120 MG/1.7ML ~~LOC~~ SOLN
120.0000 mg | Freq: Once | SUBCUTANEOUS | Status: AC
  Administered 2016-03-30: 120 mg via SUBCUTANEOUS
  Filled 2016-03-30: qty 1.7

## 2016-03-30 MED ORDER — SODIUM CHLORIDE 0.9 % IV SOLN
1200.0000 mg | Freq: Once | INTRAVENOUS | Status: AC
  Administered 2016-03-30: 1200 mg via INTRAVENOUS
  Filled 2016-03-30: qty 20

## 2016-03-30 MED ORDER — HEPARIN SOD (PORK) LOCK FLUSH 100 UNIT/ML IV SOLN
500.0000 [IU] | Freq: Once | INTRAVENOUS | Status: AC | PRN
  Administered 2016-03-30: 500 [IU]
  Filled 2016-03-30: qty 5

## 2016-03-30 NOTE — Telephone Encounter (Signed)
NO 12/29 LOS COMPLETED. GAVE PATIENT AVS REPORT AND SCHEDULE FOR EXISTING APPOINTMENT.

## 2016-03-30 NOTE — Patient Instructions (Signed)
[facility] Discharge Instructions for Patients Receiving Chemotherapy  Today you received the following chemotherapy agents:  atezolizumab [name]  To help prevent nausea and vomiting after your treatment, we encourage you to take your nausea medication as prescribed.   If you develop nausea and vomiting that is not controlled by your nausea medication, call the clinic.   BELOW ARE SYMPTOMS THAT SHOULD BE REPORTED IMMEDIATELY:  *FEVER GREATER THAN 100.5 F  *CHILLS WITH OR WITHOUT FEVER  NAUSEA AND VOMITING THAT IS NOT CONTROLLED WITH YOUR NAUSEA MEDICATION  *UNUSUAL SHORTNESS OF BREATH  *UNUSUAL BRUISING OR BLEEDING  TENDERNESS IN MOUTH AND THROAT WITH OR WITHOUT PRESENCE OF ULCERS  *URINARY PROBLEMS  *BOWEL PROBLEMS  UNUSUAL RASH Items with * indicate a potential emergency and should be followed up as soon as possible.  Feel free to call the clinic you have any questions or concerns. The clinic phone number is [phone].  Please show the [place] at check-in to the Emergency Department and triage nurse.

## 2016-03-30 NOTE — Progress Notes (Signed)
HPI:  Tiffany [name] 82 y.o. female diagnosed with renal cell cancer.  Currently undergoing Tecentriq and xgeva therapy.     No history exists.    Review of Systems  All other systems reviewed and are negative.   Past Medical History:  Diagnosis Date  . Arthritis     knees 03-10-12 had Cortisone injection  . Cancer [facility]) june 2016   metastatic  . DDD (degenerative disc disease), cervical   . Edema leg    feet and ankles  . Esophagus disorder    Had esophagus stretched  . GERD (gastroesophageal reflux disease)    Benign stricture dilated in 2011  . Gout    Patient notes she has possible gout but has not been on chronic medications for this  . Headache(784.0)   . Hypertension   . Occipital neuralgia   . Occipital neuralgia    Related to cervical degenerative disc disease  . Peptic ulcer disease    Previous history in the remote past  . Ulcerative proctitis ([place])   . Ulcerative proctitis ([place]) 2014    Past Surgical History:  Procedure Laterality Date  . ABDOMINAL HYSTERECTOMY    . APPENDECTOMY    . CHOLECYSTECTOMY    . COLONOSCOPY WITH PROPOFOL  03/25/2012   Procedure: COLONOSCOPY WITH PROPOFOL;  Surgeon: Garlan Fair, MD;  [facility];  Service: Endoscopy;  Laterality: N/A;  . DILATION AND CURETTAGE OF UTERUS    . ESOPHAGEAL DILATION     For benign stricture in 2011  . ESOPHAGOGASTRODUODENOSCOPY    . FLEXIBLE SIGMOIDOSCOPY N/A 04/26/2014   Procedure: FLEXIBLE SIGMOIDOSCOPY - UnSedated;  Surgeon: Garlan Fair, MD;  [facility];  Service: Endoscopy;  Laterality: N/A;  . FLEXIBLE SIGMOIDOSCOPY N/A 01/24/2015   Procedure: FLEXIBLE SIGNMOIDOSCOPY W/ FLEET ENEMIA;  Surgeon: Garlan Fair, MD;  [facility];  Service: Endoscopy;  Laterality: N/A;  . NECK SURGERY     20 years ago  . TOTAL ABDOMINAL HYSTERECTOMY W/ BILATERAL SALPINGOOPHORECTOMY     At age 81 due to miscarriage and retained products of conception causing  significant uterine bleeding. Patient has been on estrogen replacement therapy since then.    has Transitional cell carcinoma of renal pelvis ([place]); Insomnia; Bronchopneumonia; Liver metastases ([place]); Bone metastases ([place]); Bronchiectasis ([place]) with probable Assoc MAI ; Bronchiolitis; and Port catheter in place on her problem list.    is allergic to nitrofurantoin; indomethacin; and lisinopril.  Allergies as of 03/30/2016      Reactions   Nitrofurantoin Other (See Comments)   Trembling   Indomethacin Swelling, Other (See Comments)   Tongue swelling   Lisinopril Swelling, Other (See Comments)   Tongue swelling      Medication List       Accurate as of 03/30/16  2:03 PM. Always use your most recent med list.          acetaminophen 650 MG CR tablet Commonly known as:  TYLENOL Take 1,300 mg by mouth 2 (two) times daily.   amLODipine 5 MG tablet Commonly known as:  NORVASC Take 5 mg by mouth daily.   azithromycin 250 MG tablet Commonly known as:  ZITHROMAX Take 1 tablet by mouth daily. Last day 10/28   budesonide 3 MG 24 hr capsule Commonly known as:  ENTOCORT EC Take 1 capsule (3 mg total) by mouth daily. If still having rectal cramping/bleeding increase to 22m po daily   CANASA 1000 MG suppository Generic drug:  mesalamine Place 1,000  mg rectally daily as needed.   COLCRYS 0.6 MG tablet Generic drug:  colchicine TK 1 T PO  QD TO BID PRN ACUTE GOUT FLARE FOR 7 DAYS   estradiol 0.5 MG tablet Commonly known as:  ESTRACE Take 0.5 mg by mouth daily.   FLUTTER Devi 1 each by Does not apply route daily.   guaiFENesin 600 MG 12 hr tablet Commonly known as:  MUCINEX Take by mouth 2 (two) times daily.   mirtazapine 15 MG tablet Commonly known as:  REMERON Take 1 tablet (15 mg total) by mouth at bedtime.   ondansetron 4 MG tablet Commonly known as:  ZOFRAN Take 1 tablet (4 mg total) by mouth every 8 (eight) hours as needed for nausea or vomiting.   OVER THE  COUNTER MEDICATION Apply 1 application topically. [place] dream over the counter cream for arthritis   oxyCODONE 5 MG immediate release tablet Commonly known as:  Oxy IR/ROXICODONE Take 0.5-1 tablets (2.5-5 mg total) by mouth every 4 (four) hours as needed for moderate pain, severe pain or breakthrough pain.   PRESCRIPTION MEDICATION Chemo card   [place] HC rectal foam Generic drug:  hydrocortisone-pramoxine INSTILL 1 APPLICATORFUL RECTALLY EACH EVENING   senna-docusate 8.6-50 MG tablet Commonly known as:  SENNA S Take 2 tablets by mouth at bedtime.   triamcinolone ointment 0.5 % Commonly known as:  KENALOG Apply 1 application topically 2 (two) times daily.        PHYSICAL EXAMINATION  Oncology Vitals 03/30/2016 03/08/2016  Height 155 cm 155 cm  Weight 56.654 kg 56.745 kg  Weight (lbs) 124 lbs 14 oz 125 lbs 2 oz  BMI (kg/m2) 23.6 kg/m2 23.64 kg/m2  Temp 97.6 98.2  Pulse 76 72  Resp 18 18  SpO2 94 97  BSA (m2) 1.56 m2 1.56 m2   BP Readings from Last 2 Encounters:  03/30/16 (!) 157/72  03/08/16 (!) 150/62    Physical Exam  Constitutional: She is oriented to person, place, and time and well-developed, well-nourished, and in no distress.  HENT:  Head: Normocephalic and atraumatic.  Eyes: Conjunctivae and EOM are normal. Pupils are equal, round, and reactive to light.  Neck: Normal range of motion.  Pulmonary/Chest: Effort normal. No respiratory distress.  Musculoskeletal: Normal range of motion.  Neurological: She is alert and oriented to person, place, and time.  Once with assistance of a cane as baseline  Skin: Skin is warm and dry.  Psychiatric: Affect normal.  Nursing note and vitals reviewed.   LABORATORY DATA:. Appointment on 03/30/2016  Component Date Value Ref Range Status  . WBC 03/30/2016 5.9  3.9 - 10.3 10e3/uL Final  . NEUT# 03/30/2016 3.1  1.5 - 6.5 10e3/uL Final  . HGB 03/30/2016 10.3* 11.6 - 15.9 g/dL Final  . HCT 03/30/2016 32.7* 34.8 -  46.6 % Final  . Platelets 03/30/2016 307  145 - 400 10e3/uL Final  . MCV 03/30/2016 94.0  79.5 - 101.0 fL Final  . MCH 03/30/2016 29.6  25.1 - 34.0 pg Final  . MCHC 03/30/2016 31.5  31.5 - 36.0 g/dL Final  . RBC 03/30/2016 3.48* 3.70 - 5.45 10e6/uL Final  . RDW 03/30/2016 14.5  11.2 - 14.5 % Final  . lymph# 03/30/2016 1.9  0.9 - 3.3 10e3/uL Final  . MONO# 03/30/2016 0.7  0.1 - 0.9 10e3/uL Final  . Eosinophils Absolute 03/30/2016 0.1  0.0 - 0.5 10e3/uL Final  . Basophils Absolute 03/30/2016 0.0  0.0 - 0.1 10e3/uL Final  . NEUT% 03/30/2016  53.1  38.4 - 76.8 % Final  . LYMPH% 03/30/2016 31.9  14.0 - 49.7 % Final  . MONO% 03/30/2016 12.6  0.0 - 14.0 % Final  . EOS% 03/30/2016 2.2  0.0 - 7.0 % Final  . BASO% 03/30/2016 0.2  0.0 - 2.0 % Final  . Retic % 03/30/2016 2.01  0.70 - 2.10 % Final  . Retic Ct Abs 03/30/2016 69.95  33.70 - 90.70 10e3/uL Final  . Immature Retic Fract 03/30/2016 15.00* 1.60 - 10.00 % Final  . Sodium 03/30/2016 139  136 - 145 mEq/L Final  . Potassium 03/30/2016 4.2  3.5 - 5.1 mEq/L Final  . Chloride 03/30/2016 106  98 - 109 mEq/L Final  . CO2 03/30/2016 23  22 - 29 mEq/L Final  . Glucose 03/30/2016 87  70 - 140 mg/dl Final  . BUN 03/30/2016 18.4  7.0 - 26.0 mg/dL Final  . Creatinine 03/30/2016 1.0  0.6 - 1.1 mg/dL Final  . Total Bilirubin 03/30/2016 0.33  0.20 - 1.20 mg/dL Final  . Alkaline Phosphatase 03/30/2016 47  40 - 150 U/L Final  . AST 03/30/2016 18  5 - 34 U/L Final  . ALT 03/30/2016 8  0 - 55 U/L Final  . Total Protein 03/30/2016 7.9  6.4 - 8.3 g/dL Final  . Albumin 03/30/2016 3.4* 3.5 - 5.0 g/dL Final  . Calcium 03/30/2016 9.1  8.4 - 10.4 mg/dL Final  . Anion Gap 03/30/2016 9  3 - 11 mEq/L Final  . EGFR 03/30/2016 48* >90 ml/min/1.73 m2 Final    RADIOGRAPHIC STUDIES: No results found.  ASSESSMENT/PLAN:    Transitional cell carcinoma of renal pelvis [facility]) Patient presents to the [facility] today to receive cycle 23 of her Tecentriq therapy.   Patient will also receive her Delton See for bone support today as well.   Patient states she's been doing very well recently; he denies any new symptoms whatsoever.  She denies any nausea, vomiting, diarrhea, or constipation.  She denies any URI symptoms.  She denies any rectal pain.  She denies any hematuria or blood in her stool.  She denies any recent fevers or chills.  Labs obtained today were all essentially within normal limits.  Patient will proceed today with her treatment as previously scheduled.  She will return on 04/20/2016 for labs, flush, visit, and her next cycle of treatment.   Patient stated understanding of all instructions; and was in agreement with this plan of care. The patient knows to call the clinic with any problems, questions or concerns.   Total time spent with patient was 15 minutes;  with greater than 75 percent of that time spent in face to face counseling regarding patient's symptoms,  and coordination of care and follow up.  Disclaimer:This dictation was prepared with Dragon/digital dictation along with [employer]. Any transcriptional errors that result from this process are unintentional.  Drue Second, NP 03/30/2016

## 2016-03-30 NOTE — Assessment & Plan Note (Signed)
Patient presents to the [facility] today to receive cycle 23 of her Tecentriq therapy.  Patient will also receive her Delton See for bone support today as well.   Patient states she's been doing very well recently; he denies any new symptoms whatsoever.  She denies any nausea, vomiting, diarrhea, or constipation.  She denies any URI symptoms.  She denies any rectal pain.  She denies any hematuria or blood in her stool.  She denies any recent fevers or chills.  Labs obtained today were all essentially within normal limits.  Patient will proceed today with her treatment as previously scheduled.  She will return on 04/20/2016 for labs, flush, visit, and her next cycle of treatment.

## 2016-04-04 ENCOUNTER — Other Ambulatory Visit: Payer: Self-pay | Admitting: Hematology

## 2016-04-20 ENCOUNTER — Ambulatory Visit (HOSPITAL_BASED_OUTPATIENT_CLINIC_OR_DEPARTMENT_OTHER): Payer: Medicare Other | Admitting: Hematology

## 2016-04-20 ENCOUNTER — Other Ambulatory Visit: Payer: Self-pay | Admitting: *Deleted

## 2016-04-20 ENCOUNTER — Telehealth: Payer: Self-pay | Admitting: Hematology

## 2016-04-20 ENCOUNTER — Other Ambulatory Visit (HOSPITAL_BASED_OUTPATIENT_CLINIC_OR_DEPARTMENT_OTHER): Payer: Medicare Other

## 2016-04-20 ENCOUNTER — Ambulatory Visit (HOSPITAL_BASED_OUTPATIENT_CLINIC_OR_DEPARTMENT_OTHER): Payer: Medicare Other

## 2016-04-20 ENCOUNTER — Ambulatory Visit: Payer: Medicare Other

## 2016-04-20 ENCOUNTER — Encounter: Payer: Self-pay | Admitting: Hematology

## 2016-04-20 VITALS — BP 137/66 | HR 68 | Temp 97.9°F | Resp 17 | Ht 61.0 in | Wt 125.9 lb

## 2016-04-20 DIAGNOSIS — C787 Secondary malignant neoplasm of liver and intrahepatic bile duct: Secondary | ICD-10-CM

## 2016-04-20 DIAGNOSIS — C651 Malignant neoplasm of right renal pelvis: Secondary | ICD-10-CM | POA: Diagnosis not present

## 2016-04-20 DIAGNOSIS — Z5112 Encounter for antineoplastic immunotherapy: Secondary | ICD-10-CM

## 2016-04-20 DIAGNOSIS — C7951 Secondary malignant neoplasm of bone: Secondary | ICD-10-CM

## 2016-04-20 DIAGNOSIS — C659 Malignant neoplasm of unspecified renal pelvis: Secondary | ICD-10-CM

## 2016-04-20 DIAGNOSIS — C7800 Secondary malignant neoplasm of unspecified lung: Secondary | ICD-10-CM

## 2016-04-20 DIAGNOSIS — Z95828 Presence of other vascular implants and grafts: Secondary | ICD-10-CM

## 2016-04-20 DIAGNOSIS — C778 Secondary and unspecified malignant neoplasm of lymph nodes of multiple regions: Secondary | ICD-10-CM

## 2016-04-20 LAB — COMPREHENSIVE METABOLIC PANEL
ALT: 6 U/L (ref 0–55)
AST: 15 U/L (ref 5–34)
Albumin: 3.2 g/dL — ABNORMAL LOW (ref 3.5–5.0)
Alkaline Phosphatase: 40 U/L (ref 40–150)
Anion Gap: 6 mEq/L (ref 3–11)
BILIRUBIN TOTAL: 0.35 mg/dL (ref 0.20–1.20)
BUN: 14.9 mg/dL (ref 7.0–26.0)
CO2: 25 meq/L (ref 22–29)
Calcium: 9.2 mg/dL (ref 8.4–10.4)
Chloride: 107 mEq/L (ref 98–109)
Creatinine: 0.9 mg/dL (ref 0.6–1.1)
EGFR: 54 mL/min/{1.73_m2} — AB (ref >90)
Glucose: 72 mg/dl (ref 70–140)
POTASSIUM: 4.3 meq/L (ref 3.5–5.1)
SODIUM: 138 meq/L (ref 136–145)
TOTAL PROTEIN: 7.2 g/dL (ref 6.4–8.3)

## 2016-04-20 LAB — CBC WITH DIFFERENTIAL/PLATELET
BASO%: 0.4 % (ref 0.0–2.0)
Basophils Absolute: 0 10*3/uL (ref 0.0–0.1)
EOS%: 2.9 % (ref 0.0–7.0)
Eosinophils Absolute: 0.1 10*3/uL (ref 0.0–0.5)
HCT: 31.5 % — ABNORMAL LOW (ref 34.8–46.6)
HEMOGLOBIN: 9.8 g/dL — AB (ref 11.6–15.9)
LYMPH%: 42.3 % (ref 14.0–49.7)
MCH: 29.3 pg (ref 25.1–34.0)
MCHC: 31.1 g/dL — ABNORMAL LOW (ref 31.5–36.0)
MCV: 94 fL (ref 79.5–101.0)
MONO#: 1 10*3/uL — ABNORMAL HIGH (ref 0.1–0.9)
MONO%: 21.1 % — AB (ref 0.0–14.0)
NEUT%: 33.3 % — ABNORMAL LOW (ref 38.4–76.8)
NEUTROS ABS: 1.5 10*3/uL (ref 1.5–6.5)
Platelets: 240 10*3/uL (ref 145–400)
RBC: 3.35 10*6/uL — AB (ref 3.70–5.45)
RDW: 14.9 % — AB (ref 11.2–14.5)
WBC: 4.5 10*3/uL (ref 3.9–10.3)
lymph#: 1.9 10*3/uL (ref 0.9–3.3)

## 2016-04-20 MED ORDER — SODIUM CHLORIDE 0.9 % IV SOLN
1200.0000 mg | Freq: Once | INTRAVENOUS | Status: AC
  Administered 2016-04-20: 1200 mg via INTRAVENOUS
  Filled 2016-04-20: qty 20

## 2016-04-20 MED ORDER — SODIUM CHLORIDE 0.9 % IJ SOLN
10.0000 mL | INTRAMUSCULAR | Status: DC | PRN
  Administered 2016-04-20: 10 mL via INTRAVENOUS
  Filled 2016-04-20: qty 10

## 2016-04-20 MED ORDER — HEPARIN SOD (PORK) LOCK FLUSH 100 UNIT/ML IV SOLN
500.0000 [IU] | Freq: Once | INTRAVENOUS | Status: AC | PRN
  Administered 2016-04-20: 500 [IU]
  Filled 2016-04-20: qty 5

## 2016-04-20 MED ORDER — SODIUM CHLORIDE 0.9 % IV SOLN
Freq: Once | INTRAVENOUS | Status: AC
  Administered 2016-04-20: 11:00:00 via INTRAVENOUS

## 2016-04-20 MED ORDER — SODIUM CHLORIDE 0.9 % IJ SOLN
10.0000 mL | INTRAMUSCULAR | Status: DC | PRN
  Administered 2016-04-20: 10 mL
  Filled 2016-04-20: qty 10

## 2016-04-20 MED ORDER — BUDESONIDE 3 MG PO CPEP: 6.0000 mg | ORAL_CAPSULE | Freq: Every day | ORAL | 0 refills | Status: DC

## 2016-04-20 NOTE — Patient Instructions (Signed)
[facility] Discharge Instructions for Patients Receiving Chemotherapy  Today you received the following chemotherapy agents:  atezolizumab [name]  To help prevent nausea and vomiting after your treatment, we encourage you to take your nausea medication as prescribed.   If you develop nausea and vomiting that is not controlled by your nausea medication, call the clinic.   BELOW ARE SYMPTOMS THAT SHOULD BE REPORTED IMMEDIATELY:  *FEVER GREATER THAN 100.5 F  *CHILLS WITH OR WITHOUT FEVER  NAUSEA AND VOMITING THAT IS NOT CONTROLLED WITH YOUR NAUSEA MEDICATION  *UNUSUAL SHORTNESS OF BREATH  *UNUSUAL BRUISING OR BLEEDING  TENDERNESS IN MOUTH AND THROAT WITH OR WITHOUT PRESENCE OF ULCERS  *URINARY PROBLEMS  *BOWEL PROBLEMS  UNUSUAL RASH Items with * indicate a potential emergency and should be followed up as soon as possible.  Feel free to call the clinic you have any questions or concerns. The clinic phone number is [phone].  Please show the [facility] at check-in to the Emergency Department and triage nurse.

## 2016-04-20 NOTE — Telephone Encounter (Signed)
Message sent chemo scheduler to be added per 04/20/16 los. Appointments scheduled per 04/20/16 los. Patient was given a copy of the AVS report and appointment schedule,per 04/20/16 los.

## 2016-04-22 NOTE — Progress Notes (Signed)
Hematology oncology clinic follow-up.  Date of service. 04/20/2016  Patient Care Team: [name], MD as PCP - General (Internal Medicine)   Urologist: Dr. 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  Diagnosis:  Widely metastatic transitional cell carcinoma from the right renal pelvis.  Treatment -Atezolizumab IV q3weeks status post 23 cycles here for her 24th cycle. Alphonse Guild for bone mets  HISTORY OF PRESENTING ILLNESS: Please see my previous notes for details of initial presentation.  INTERVAL HISTORY  Tiffany [name] is here for her scheduled follow-up prior to her next cycle of Atezolizumab. She notes no acute new symptoms since her last visit. She notes that her rectal bleeding is still present. No diarrhea or abdominal cramping. Has not been using her rectal 5-ASA supp due to cost issues and the same is the case with the proctofoam prescribed by Dr Wynetta Emery. Previously had symptom improvement with Budesonide. We discussed that I shall increase Budesonide to 6mg  po daily and that she f/u with Dr Wynetta Emery for a more definitive re-evaluation to determine the etiology of rectal bleeding ?still inflammatory proctitis vs alternative etiology.   MEDICAL HISTORY:  Past Medical History:  Diagnosis Date  . Arthritis     knees 03-10-12 had Cortisone injection  . Cancer [facility]) june 2016   metastatic  . DDD (degenerative disc disease), cervical   . Edema leg    feet and ankles  . Esophagus disorder    Had esophagus stretched  . GERD (gastroesophageal reflux disease)    Benign stricture dilated in 2011  . Gout    Patient notes she has possible gout but has not been on chronic medications for this  . Headache(784.0)   . Hypertension   . Occipital neuralgia   . Occipital neuralgia    Related to cervical degenerative disc disease  . Peptic  ulcer disease    Previous history in the remote past  . Ulcerative proctitis ([place])   . Ulcerative proctitis ([place]) 2014    SURGICAL HISTORY: Past Surgical History:  Procedure Laterality Date  . ABDOMINAL HYSTERECTOMY    . APPENDECTOMY    . CHOLECYSTECTOMY    . COLONOSCOPY WITH PROPOFOL  03/25/2012   Procedure: COLONOSCOPY WITH PROPOFOL;  Surgeon: Garlan Fair, MD;  Location: [facility]COPY;  Service: Endoscopy;  Laterality: N/A;  . DILATION AND CURETTAGE OF UTERUS    . ESOPHAGEAL DILATION     For benign stricture in 2011  . ESOPHAGOGASTRODUODENOSCOPY    . FLEXIBLE SIGMOIDOSCOPY N/A 04/26/2014   Procedure: FLEXIBLE SIGMOIDOSCOPY - UnSedated;  Surgeon: Garlan Fair, MD;  Location: [facility];  Service: Endoscopy;  Laterality: N/A;  . FLEXIBLE SIGMOIDOSCOPY N/A 01/24/2015   Procedure: FLEXIBLE SIGNMOIDOSCOPY W/ FLEET ENEMIA;  Surgeon: Garlan Fair, MD;  Location: [facility];  Service: Endoscopy;  Laterality: N/A;  . NECK SURGERY     20 years ago  . TOTAL ABDOMINAL HYSTERECTOMY W/ BILATERAL SALPINGOOPHORECTOMY     At age 39 due to miscarriage and retained products of conception causing significant uterine bleeding. Patient has been on estrogen replacement therapy since then.    SOCIAL HISTORY: Social History   Social History  . Marital status: Married    Spouse name: N/A  . Number of children: N/A  . Years of education: N/A   Occupational History  . Not on file.   Social History Main Topics  . Smoking  status: Former Smoker    Quit date: 11/01/1983  . Smokeless tobacco: Never Used  . Alcohol use Yes     Comment: occ  . Drug use: No  . Sexual activity: No   Other Topics Concern  . Not on file   Social History Narrative  . No narrative on file  Retired as Conservation officer, nature for [employer]. She is very well spoken and intelligent individual and exhibits a keen knowledge of her medical conditions.  FAMILY HISTORY: Family History  Problem  Relation Age of Onset  . Lung cancer Sister   . Prostate cancer Brother   . Uterine cancer Maternal Aunt   . Breast cancer Paternal Grandmother   . Hodgkin's lymphoma Sister     ALLERGIES:  is allergic to nitrofurantoin; indomethacin; and lisinopril.  MEDICATIONS:  Current Outpatient Prescriptions  Medication Sig Dispense Refill  . acetaminophen (TYLENOL) 650 MG CR tablet Take 1,300 mg by mouth 2 (two) times daily.    Marland Kitchen amLODipine (NORVASC) 5 MG tablet Take 5 mg by mouth daily.    Marland Kitchen azithromycin (ZITHROMAX) 250 MG tablet Take 1 tablet by mouth daily. Last day 10/28  1  . budesonide (ENTOCORT EC) 3 MG 24 hr capsule Take 2 capsules (6 mg total) by mouth daily. followup with your GI doctor to optimize further management. 60 capsule 0  . CANASA 1000 MG suppository Place 1,000 mg rectally daily as needed.   11  . COLCRYS 0.6 MG tablet TK 1 T PO  QD TO BID PRN ACUTE GOUT FLARE FOR 7 DAYS  1  . estradiol (ESTRACE) 0.5 MG tablet Take 0.5 mg by mouth daily.  1  . guaiFENesin (MUCINEX) 600 MG 12 hr tablet Take by mouth 2 (two) times daily.    . mirtazapine (REMERON) 15 MG tablet TAKE ONE TABLET BY MOUTH AT BEDTIME 90 tablet 0  . ondansetron (ZOFRAN) 4 MG tablet Take 1 tablet (4 mg total) by mouth every 8 (eight) hours as needed for nausea or vomiting. 30 tablet 0  . OVER THE COUNTER MEDICATION Apply 1 application topically. [place] dream over the counter cream for arthritis    . oxyCODONE (OXY IR/ROXICODONE) 5 MG immediate release tablet Take 0.5-1 tablets (2.5-5 mg total) by mouth every 4 (four) hours as needed for moderate pain, severe pain or breakthrough pain. 60 tablet 0  . PRESCRIPTION MEDICATION Chemo card    . PROCTOFOAM HC rectal foam INSTILL 1 APPLICATORFUL RECTALLY EACH EVENING  12  . Respiratory Therapy Supplies (FLUTTER) DEVI 1 each by Does not apply route daily. 1 each 0  . senna-docusate (SENNA S) 8.6-50 MG per tablet Take 2 tablets by mouth at bedtime. 60 tablet 1  .  triamcinolone ointment (KENALOG) 0.5 % Apply 1 application topically 2 (two) times daily. 30 g 0   No current facility-administered medications for this visit.    REVIEW OF SYSTEMS:    10 point review of systems done and is negative except as noted above  PHYSICAL EXAMINATION: ECOG PERFORMANCE STATUS: 1  Vitals:   04/20/16 1000  BP: 137/66  Pulse: 68  Resp: 17  Temp: 97.9 F (36.6 C)   Filed Weights   04/20/16 1000  Weight: 125 lb 14.4 oz (57.1 kg)   GENERAL elderly Caucasian female, alert, no distress and comfortable SKIN: resolved skin rashes EYES: normal, conjunctiva are pink and non-injected, sclera clear OROPHARYNX:no exudate, no erythema and lips, buccal mucosa, and tongue normal  NECK: supple, thyroid normal size,  non-tender, without nodularity LYMPH:  no palpable lymphadenopathy in the cervical, axillary or inguinal LUNGS: few bibasilar rales, scattered rhonci, good air entry. HEART: regular rate & rhythm and no murmurs and no lower extremity edema ABDOMEN:abdomen soft, non-tender and normal bowel sounds Musculoskeletal:no cyanosis of digits and no clubbing  PSYCH: alert & oriented x 3 with fluent speech NEURO: no focal motor/sensory deficits  LABORATORY DATA:  CBC Latest Ref Rng & Units 04/20/2016 03/30/2016 03/08/2016  WBC 3.9 - 10.3 10e3/uL 4.5 5.9 5.5  Hemoglobin 11.6 - 15.9 g/dL 9.8(L) 10.3(L) 10.0(L)  Hematocrit 34.8 - 46.6 % 31.5(L) 32.7(L) 32.1(L)  Platelets 145 - 400 10e3/uL 240 307 258   . CMP Latest Ref Rng & Units 04/20/2016 03/30/2016 03/08/2016  Glucose 70 - 140 mg/dl 72 87 89  BUN 7.0 - 26.0 mg/dL 14.9 18.4 16.8  Creatinine 0.6 - 1.1 mg/dL 0.9 1.0 1.1  Sodium 136 - 145 mEq/L 138 139 140  Potassium 3.5 - 5.1 mEq/L 4.3 4.2 4.1  Chloride 101 - 111 mmol/L - - -  CO2 22 - 29 mEq/L 25 23 24   Calcium 8.4 - 10.4 mg/dL 9.2 9.1 9.1  Total Protein 6.4 - 8.3 g/dL 7.2 7.9 7.7  Total Bilirubin 0.20 - 1.20 mg/dL 0.35 0.33 0.37  Alkaline Phos 40 - 150 U/L 40  47 45  AST 5 - 34 U/L 15 18 19   ALT 0 - 55 U/L 6 8 <6   Radiology .No results found.  ASSESSMENT & PLAN:   83 year old Caucasian female with  #1 Right Renal pelvis metastatic transitional cell carcinoma.  PET scan revealed metastases to the lungs, liver, multiple nodal stations and bones. She is status post 23 cycles of Atezolizumab. PET scan done after 8 cycles of treatment for restaging disease showed significant response to treatment with no evidence of active disease. Patient has no clinical evidence of disease progression at this time. CT chest 06/16/2015 showed no evidence of cancer progression. PET/CT scan done on 10/21/2015- showed no findings for metastatic disease. New right-sided hydronephrosis versus local tumor recurrence needs to be evaluated further. MRI of the kidney showed hyperenhancing the right renal pelvis lesion consistent with concern for local recurrence.  -Patient was recently evaluated by urology for this and deemed not to be a candidate for surgery for local disease control or any other intervention at this time.  -Patient has been seen by radiation oncology and has had input from them regarding the potential role of RT if her rt renal pelvis lesions were to become symptomatic.  PET/CT 03/06/2016 with no findings of metastatic disease. Extensive right-sided hydronephrosis with high activity from excreted FDG obscuring any findings along the collecting system.  #2 h/o skin rash likely related to her Atezolizumab. Minimal grade 1 and topical triamcinolone . Currently quiescent. #3 Cough and some clear secretions likely due to bronchiectasis - stable symptoms. No chest pain no increased shortness of breath or dyspnea on exertion . #4 minimal rectal bleeding due to inflammatory proctitis on 5-ASA suppositories and proctofoam as per Dr. Wynetta Emery.  Her rectal symptoms are more bothersome than the last time and she was started on oral budesonide with resolution of  symptoms. Budesonide was tapered down and her rectal bleeding recurred.  Plan  --labs stable.  Patient has no new treatment toxicities.  No clinical symptoms suggestive of overt disease recurrence/progression at this time. -We'll continue her Atezolizumab  -Continue Xgeva every 6 weeks -Continue triamcinolone ointment for her skin rash as needed. (Has not needed  this for the last few months). -f/u with GI Dr Wynetta Emery for more definitive evaluation of her rectal bleeding was recommended mesalamine suppositories and proctofoam for ulcerative proctitis (patient not using due to cost issues). -Oral Budesonide helped @ 3mg  po daily and abdominal cramping and rectal bleeding had resolved and rectal bleeding reappeared with tapering of po budesonide --  -increased budesonide dose from 3mg  po 6mg  po daily and recommended she f/u with Dr Wynetta Emery. #4 history of hypertension   -on Amlodipine    Continue Atezolizumab q3weeks and Alphonse Guild RTC with Dr Irene Limbo in 3 weeks with labs    Sullivan Lone MD [place] [facility]/Oncology Physician [facility]  (Office):       [phone] (Work cell):  [phone] (Fax):           [phone]

## 2016-04-23 ENCOUNTER — Telehealth: Payer: Self-pay | Admitting: *Deleted

## 2016-04-23 NOTE — Telephone Encounter (Signed)
Per 1/19 staff message I have scheduled appt and notified the scheduler

## 2016-05-02 ENCOUNTER — Other Ambulatory Visit: Payer: Self-pay | Admitting: Gastroenterology

## 2016-05-09 ENCOUNTER — Telehealth: Payer: Self-pay | Admitting: *Deleted

## 2016-05-09 NOTE — Telephone Encounter (Signed)
"  Can't get through to scheduling.  Would you check the computer for scheduling of my next treatment?  That was not scheduled yet the last time I was in."  Treatment has been scheduled at 12:45 pm after provider visit.  No further questions or needs at this time.

## 2016-05-11 ENCOUNTER — Ambulatory Visit (HOSPITAL_BASED_OUTPATIENT_CLINIC_OR_DEPARTMENT_OTHER): Payer: Medicare Other | Admitting: Hematology

## 2016-05-11 ENCOUNTER — Telehealth: Payer: Self-pay | Admitting: *Deleted

## 2016-05-11 ENCOUNTER — Ambulatory Visit: Payer: Medicare Other

## 2016-05-11 ENCOUNTER — Telehealth: Payer: Self-pay | Admitting: Hematology

## 2016-05-11 ENCOUNTER — Other Ambulatory Visit (HOSPITAL_BASED_OUTPATIENT_CLINIC_OR_DEPARTMENT_OTHER): Payer: Medicare Other

## 2016-05-11 ENCOUNTER — Ambulatory Visit (HOSPITAL_BASED_OUTPATIENT_CLINIC_OR_DEPARTMENT_OTHER): Payer: Medicare Other

## 2016-05-11 ENCOUNTER — Encounter: Payer: Self-pay | Admitting: Hematology

## 2016-05-11 VITALS — BP 151/67 | HR 79 | Temp 98.1°F | Resp 18 | Ht 61.0 in | Wt 128.0 lb

## 2016-05-11 DIAGNOSIS — C659 Malignant neoplasm of unspecified renal pelvis: Secondary | ICD-10-CM

## 2016-05-11 DIAGNOSIS — C7800 Secondary malignant neoplasm of unspecified lung: Secondary | ICD-10-CM | POA: Diagnosis not present

## 2016-05-11 DIAGNOSIS — C787 Secondary malignant neoplasm of liver and intrahepatic bile duct: Secondary | ICD-10-CM

## 2016-05-11 DIAGNOSIS — Z5112 Encounter for antineoplastic immunotherapy: Secondary | ICD-10-CM | POA: Diagnosis not present

## 2016-05-11 DIAGNOSIS — C7951 Secondary malignant neoplasm of bone: Secondary | ICD-10-CM

## 2016-05-11 DIAGNOSIS — Z95828 Presence of other vascular implants and grafts: Secondary | ICD-10-CM

## 2016-05-11 DIAGNOSIS — C778 Secondary and unspecified malignant neoplasm of lymph nodes of multiple regions: Secondary | ICD-10-CM

## 2016-05-11 DIAGNOSIS — I10 Essential (primary) hypertension: Secondary | ICD-10-CM

## 2016-05-11 DIAGNOSIS — K6289 Other specified diseases of anus and rectum: Secondary | ICD-10-CM

## 2016-05-11 DIAGNOSIS — C651 Malignant neoplasm of right renal pelvis: Secondary | ICD-10-CM

## 2016-05-11 LAB — COMPREHENSIVE METABOLIC PANEL
ALT: 6 U/L (ref 0–55)
ANION GAP: 7 meq/L (ref 3–11)
AST: 17 U/L (ref 5–34)
Albumin: 3.4 g/dL — ABNORMAL LOW (ref 3.5–5.0)
Alkaline Phosphatase: 45 U/L (ref 40–150)
BUN: 15.9 mg/dL (ref 7.0–26.0)
CHLORIDE: 107 meq/L (ref 98–109)
CO2: 25 mEq/L (ref 22–29)
CREATININE: 1 mg/dL (ref 0.6–1.1)
Calcium: 9.3 mg/dL (ref 8.4–10.4)
EGFR: 51 mL/min/{1.73_m2} — AB (ref >90)
Glucose: 74 mg/dl (ref 70–140)
POTASSIUM: 4.5 meq/L (ref 3.5–5.1)
Sodium: 139 mEq/L (ref 136–145)
Total Bilirubin: 0.44 mg/dL (ref 0.20–1.20)
Total Protein: 7.6 g/dL (ref 6.4–8.3)

## 2016-05-11 LAB — CBC & DIFF AND RETIC
BASO%: 0.4 % (ref 0.0–2.0)
Basophils Absolute: 0 10*3/uL (ref 0.0–0.1)
EOS%: 1.6 % (ref 0.0–7.0)
Eosinophils Absolute: 0.1 10*3/uL (ref 0.0–0.5)
HCT: 32.2 % — ABNORMAL LOW (ref 34.8–46.6)
HGB: 10 g/dL — ABNORMAL LOW (ref 11.6–15.9)
Immature Retic Fract: 13.6 % — ABNORMAL HIGH (ref 1.60–10.00)
LYMPH#: 2.6 10*3/uL (ref 0.9–3.3)
LYMPH%: 46.8 % (ref 14.0–49.7)
MCH: 28.8 pg (ref 25.1–34.0)
MCHC: 31.1 g/dL — AB (ref 31.5–36.0)
MCV: 92.8 fL (ref 79.5–101.0)
MONO#: 0.9 10*3/uL (ref 0.1–0.9)
MONO%: 17.2 % — ABNORMAL HIGH (ref 0.0–14.0)
NEUT#: 1.9 10*3/uL (ref 1.5–6.5)
NEUT%: 34 % — ABNORMAL LOW (ref 38.4–76.8)
PLATELETS: 253 10*3/uL (ref 145–400)
RBC: 3.47 10*6/uL — AB (ref 3.70–5.45)
RDW: 15.2 % — AB (ref 11.2–14.5)
RETIC CT ABS: 78.08 10*3/uL (ref 33.70–90.70)
Retic %: 2.25 % — ABNORMAL HIGH (ref 0.70–2.10)
WBC: 5.5 10*3/uL (ref 3.9–10.3)

## 2016-05-11 LAB — LACTATE DEHYDROGENASE: LDH: 228 U/L (ref 125–245)

## 2016-05-11 MED ORDER — SODIUM CHLORIDE 0.9 % IV SOLN
Freq: Once | INTRAVENOUS | Status: AC
  Administered 2016-05-11: 13:00:00 via INTRAVENOUS

## 2016-05-11 MED ORDER — HEPARIN SOD (PORK) LOCK FLUSH 100 UNIT/ML IV SOLN
500.0000 [IU] | Freq: Once | INTRAVENOUS | Status: AC | PRN
  Administered 2016-05-11: 500 [IU]
  Filled 2016-05-11: qty 5

## 2016-05-11 MED ORDER — SODIUM CHLORIDE 0.9 % IJ SOLN
10.0000 mL | INTRAMUSCULAR | Status: DC | PRN
Start: 2016-05-11 — End: 2016-05-11
  Administered 2016-05-11: 10 mL
  Filled 2016-05-11: qty 10

## 2016-05-11 MED ORDER — SODIUM CHLORIDE 0.9 % IV SOLN
1200.0000 mg | Freq: Once | INTRAVENOUS | Status: AC
  Administered 2016-05-11: 1200 mg via INTRAVENOUS
  Filled 2016-05-11: qty 20

## 2016-05-11 MED ORDER — DENOSUMAB 120 MG/1.7ML ~~LOC~~ SOLN
120.0000 mg | Freq: Once | SUBCUTANEOUS | Status: DC
  Filled 2016-05-11: qty 1.7

## 2016-05-11 NOTE — Patient Instructions (Signed)
[facility] Discharge Instructions for Patients Receiving Chemotherapy  Today you received the following chemotherapy agents: Tecentriq  To help prevent nausea and vomiting after your treatment, we encourage you to take your nausea medication as directed.    If you develop nausea and vomiting that is not controlled by your nausea medication, call the clinic.   BELOW ARE SYMPTOMS THAT SHOULD BE REPORTED IMMEDIATELY:  *FEVER GREATER THAN 100.5 F  *CHILLS WITH OR WITHOUT FEVER  NAUSEA AND VOMITING THAT IS NOT CONTROLLED WITH YOUR NAUSEA MEDICATION  *UNUSUAL SHORTNESS OF BREATH  *UNUSUAL BRUISING OR BLEEDING  TENDERNESS IN MOUTH AND THROAT WITH OR WITHOUT PRESENCE OF ULCERS  *URINARY PROBLEMS  *BOWEL PROBLEMS  UNUSUAL RASH Items with * indicate a potential emergency and should be followed up as soon as possible.  Feel free to call the clinic you have any questions or concerns. The clinic phone number is [phone].  Please show the CHEMO ALERT CARD at check-in to the Emergency Department and triage nurse.   

## 2016-05-11 NOTE — Telephone Encounter (Signed)
Per 2/9 LOS ans staff message I have scheduled appts. Notified the scheduler

## 2016-05-11 NOTE — Telephone Encounter (Signed)
Dr Irene Limbo N/A on 03/23, therefore no follow up was schedueld. (desk Nurse/Lashonya was notified and I was told ok to not schedule. Message sent to chemo scheduler to be added. Appointments scheduled per 05/11/16 los. Patient was given a copy of the AVS report and appointment schedule, per 05/11/16 los.

## 2016-05-12 ENCOUNTER — Ambulatory Visit (HOSPITAL_BASED_OUTPATIENT_CLINIC_OR_DEPARTMENT_OTHER): Payer: Medicare Other

## 2016-05-12 VITALS — BP 153/74 | HR 85 | Temp 98.4°F | Resp 18

## 2016-05-12 DIAGNOSIS — C7951 Secondary malignant neoplasm of bone: Secondary | ICD-10-CM

## 2016-05-12 DIAGNOSIS — C651 Malignant neoplasm of right renal pelvis: Secondary | ICD-10-CM | POA: Diagnosis not present

## 2016-05-12 DIAGNOSIS — Z95828 Presence of other vascular implants and grafts: Secondary | ICD-10-CM

## 2016-05-12 MED ORDER — DENOSUMAB 120 MG/1.7ML ~~LOC~~ SOLN
120.0000 mg | Freq: Once | SUBCUTANEOUS | Status: AC
  Administered 2016-05-12: 120 mg via SUBCUTANEOUS

## 2016-05-12 NOTE — Patient Instructions (Signed)
Denosumab injection What is this medicine? DENOSUMAB (den oh sue mab) slows bone breakdown. Prolia is used to treat osteoporosis in women after menopause and in men. Xgeva is used to prevent bone fractures and other bone problems caused by cancer bone metastases. Xgeva is also used to treat giant cell tumor of the bone. COMMON BRAND NAME(S): Prolia, XGEVA What should I tell my health care provider before I take this medicine? They need to know if you have any of these conditions: -dental disease -eczema -infection or history of infections -kidney disease or on dialysis -low blood calcium or vitamin D -malabsorption syndrome -scheduled to have surgery or tooth extraction -taking medicine that contains denosumab -thyroid or parathyroid disease -an unusual reaction to denosumab, other medicines, foods, dyes, or preservatives -pregnant or trying to get pregnant -breast-feeding How should I use this medicine? This medicine is for injection under the skin. It is given by a health care professional in a hospital or clinic setting. If you are getting Prolia, a special MedGuide will be given to you by the pharmacist with each prescription and refill. Be sure to read this information carefully each time. For Prolia, talk to your pediatrician regarding the use of this medicine in children. Special care may be needed. For Xgeva, talk to your pediatrician regarding the use of this medicine in children. While this drug may be prescribed for children as young as 13 years for selected conditions, precautions do apply. What if I miss a dose? It is important not to miss your dose. Call your doctor or health care professional if you are unable to keep an appointment. What may interact with this medicine? Do not take this medicine with any of the following medications: -other medicines containing denosumab This medicine may also interact with the following medications: -medicines that suppress the immune  system -medicines that treat cancer -steroid medicines like prednisone or cortisone What should I watch for while using this medicine? Visit your doctor or health care professional for regular checks on your progress. Your doctor or health care professional may order blood tests and other tests to see how you are doing. Call your doctor or health care professional if you get a cold or other infection while receiving this medicine. Do not treat yourself. This medicine may decrease your body's ability to fight infection. You should make sure you get enough calcium and vitamin D while you are taking this medicine, unless your doctor tells you not to. Discuss the foods you eat and the vitamins you take with your health care professional. See your dentist regularly. Brush and floss your teeth as directed. Before you have any dental work done, tell your dentist you are receiving this medicine. Do not become pregnant while taking this medicine or for 5 months after stopping it. Women should inform their doctor if they wish to become pregnant or think they might be pregnant. There is a potential for serious side effects to an unborn child. Talk to your health care professional or pharmacist for more information. What side effects may I notice from receiving this medicine? Side effects that you should report to your doctor or health care professional as soon as possible: -allergic reactions like skin rash, itching or hives, swelling of the face, lips, or tongue -breathing problems -chest pain -fast, irregular heartbeat -feeling faint or lightheaded, falls -fever, chills, or any other sign of infection -muscle spasms, tightening, or twitches -numbness or tingling -skin blisters or bumps, or is dry, peels, or red -slow   healing or unexplained pain in the mouth or jaw -unusual bleeding or bruising Side effects that usually do not require medical attention (report to your doctor or health care professional  if they continue or are bothersome): -muscle pain -stomach upset, gas Where should I keep my medicine? This medicine is only given in a clinic, doctor's office, or other health care setting and will not be stored at home.  2017 Elsevier/Gold Standard (2015-04-21 10:06:55)  

## 2016-05-13 NOTE — Progress Notes (Signed)
Hematology oncology clinic follow-up.  Date of service. 05/11/2016  Patient Care Team: [name], MD as PCP - General (Internal Medicine)   Urologist: Dr. 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  Diagnosis:  Widely metastatic transitional cell carcinoma from the right renal pelvis.  Treatment -Atezolizumab IV q3weeks status post 24 cycles here for her 25h cycle. Alphonse Guild for bone mets  HISTORY OF PRESENTING ILLNESS: Please see my previous notes for details of initial presentation.  INTERVAL HISTORY  Tiffany [name] is here for her scheduled follow-up prior to her next cycle of Atezolizumab. She notes that she stopped using her oral budesonide since she thought was causing some rectal cramping. Has not been using her Proctofoam or 5-ASA suppository either. Notes some mild rectal bleeding. Has a follow-up with Dr. Wynetta Emery her GI doctor.  Notes some chronic cough from her bronchiectasis. No shortness of breath or chest pain. No acute new rashes. No abdominal pain or diarrhea. Some mild chronic fatigue which is unchanged. No other prohibitive treatment toxicities. Recently turned 83 years of age.  MEDICAL HISTORY:  Past Medical History:  Diagnosis Date  . Arthritis     knees 03-10-12 had Cortisone injection  . Cancer [facility]) june 2016   metastatic  . DDD (degenerative disc disease), cervical   . Edema leg    feet and ankles  . Esophagus disorder    Had esophagus stretched  . GERD (gastroesophageal reflux disease)    Benign stricture dilated in 2011  . Gout    Patient notes she has possible gout but has not been on chronic medications for this  . Headache(784.0)   . Hypertension   . Occipital neuralgia   . Occipital neuralgia    Related to cervical degenerative disc disease  . Peptic ulcer disease    Previous history in  the remote past  . Ulcerative proctitis ([place])   . Ulcerative proctitis ([place]) 2014    SURGICAL HISTORY: Past Surgical History:  Procedure Laterality Date  . ABDOMINAL HYSTERECTOMY    . APPENDECTOMY    . CHOLECYSTECTOMY    . COLONOSCOPY WITH PROPOFOL  03/25/2012   Procedure: COLONOSCOPY WITH PROPOFOL;  Surgeon: Garlan Fair, MD;  Location: [facility];  Service: Endoscopy;  Laterality: N/A;  . DILATION AND CURETTAGE OF UTERUS    . ESOPHAGEAL DILATION     For benign stricture in 2011  . ESOPHAGOGASTRODUODENOSCOPY    . FLEXIBLE SIGMOIDOSCOPY N/A 04/26/2014   Procedure: FLEXIBLE SIGMOIDOSCOPY - UnSedated;  Surgeon: Garlan Fair, MD;  Location: [facility];  Service: Endoscopy;  Laterality: N/A;  . FLEXIBLE SIGMOIDOSCOPY N/A 01/24/2015   Procedure: FLEXIBLE SIGNMOIDOSCOPY W/ FLEET ENEMIA;  Surgeon: Garlan Fair, MD;  Location: [facility];  Service: Endoscopy;  Laterality: N/A;  . NECK SURGERY     20 years ago  . TOTAL ABDOMINAL HYSTERECTOMY W/ BILATERAL SALPINGOOPHORECTOMY     At age 89 due to miscarriage and retained products of conception causing significant uterine bleeding. Patient has been on estrogen replacement therapy since then.    SOCIAL HISTORY: Social History   Social History  . Marital status: Married    Spouse name: N/A  . Number of children: N/A  . Years of education: N/A   Occupational History  . Not on file.   Social History Main Topics  . Smoking status: Former Smoker    Quit date:  11/01/1983  . Smokeless tobacco: Never Used  . Alcohol use Yes     Comment: occ  . Drug use: No  . Sexual activity: No   Other Topics Concern  . Not on file   Social History Narrative  . No narrative on file  Retired as Conservation officer, nature for [employer]. She is very well spoken and intelligent individual and exhibits a keen knowledge of her medical conditions.  FAMILY HISTORY: Family History  Problem Relation Age of Onset  . Lung cancer Sister    . Prostate cancer Brother   . Uterine cancer Maternal Aunt   . Breast cancer Paternal Grandmother   . Hodgkin's lymphoma Sister     ALLERGIES:  is allergic to nitrofurantoin; indomethacin; and lisinopril.  MEDICATIONS:  Current Outpatient Prescriptions  Medication Sig Dispense Refill  . acetaminophen (TYLENOL) 650 MG CR tablet Take 1,300 mg by mouth 2 (two) times daily.    Marland Kitchen amLODipine (NORVASC) 5 MG tablet Take 5 mg by mouth daily.    Marland Kitchen azithromycin (ZITHROMAX) 250 MG tablet Take 250-500 tablets by mouth daily. Take 500mg s on day 1 then take 250mg s daily for 4 more days as needed for cough  1  . budesonide (ENTOCORT EC) 3 MG 24 hr capsule Take 2 capsules (6 mg total) by mouth daily. followup with your GI doctor to optimize further management. (Patient taking differently: Take 6 mg by mouth daily as needed (Inflammatory procto-colitis). followup with your GI doctor to optimize further management.) 60 capsule 0  . CANASA 1000 MG suppository Place 1,000 mg rectally daily as needed (ulcers).   11  . COLCRYS 0.6 MG tablet Take 0.6 mg by mouth once daily as needed for gout  1  . estradiol (ESTRACE) 0.5 MG tablet Take 0.5 mg by mouth daily.  1  . guaiFENesin (MUCINEX) 600 MG 12 hr tablet Take 600 mg by mouth 2 (two) times daily.     Marland Kitchen Histamine Dihydrochloride (AUSTRALIAN DREAM ARTHRITIS EX) Apply 1 application topically 2 (two) times daily.    . mirtazapine (REMERON) 15 MG tablet TAKE ONE TABLET BY MOUTH AT BEDTIME 90 tablet 0  . PRESCRIPTION MEDICATION Chemo card    . PROCTOFOAM HC rectal foam INSTILL 1 APPLICATORFUL RECTALLY AS NEEDED FOR Inflammatory procto-colitis  12  . Respiratory Therapy Supplies (FLUTTER) DEVI 1 each by Does not apply route daily. 1 each 0  . senna-docusate (SENNA S) 8.6-50 MG per tablet Take 2 tablets by mouth at bedtime. (Patient taking differently: Take 1-2 tablets by mouth daily as needed for mild constipation. ) 60 tablet 1  . triamcinolone ointment (KENALOG) 0.5  % Apply 1 application topically 2 (two) times daily. (Patient taking differently: Apply 1 application topically 2 (two) times daily as needed (rash). ) 30 g 0   No current facility-administered medications for this visit.    REVIEW OF SYSTEMS:    10 point review of systems done and is negative except as noted above  PHYSICAL EXAMINATION: ECOG PERFORMANCE STATUS: 1  Vitals:   05/11/16 1121  BP: (!) 151/67  Pulse: 79  Resp: 18  Temp: 98.1 F (36.7 C)   Filed Weights   05/11/16 1121  Weight: 128 lb (58.1 kg)   GENERAL elderly Caucasian female, alert, no distress and comfortable SKIN: resolved skin rashes EYES: normal, conjunctiva are pink and non-injected, sclera clear OROPHARYNX:no exudate, no erythema and lips, buccal mucosa, and tongue normal  NECK: supple, thyroid normal size, non-tender, without nodularity LYMPH:  no palpable lymphadenopathy in the cervical, axillary or inguinal LUNGS: few bibasilar rales, scattered rhonci, good air entry. HEART: regular rate & rhythm and no murmurs and no lower extremity edema ABDOMEN:abdomen soft, non-tender and normal bowel sounds Musculoskeletal:no cyanosis of digits and no clubbing  PSYCH: alert & oriented x 3 with fluent speech NEURO: no focal motor/sensory deficits  LABORATORY DATA:  CBC Latest Ref Rng & Units 05/11/2016 04/20/2016 03/30/2016  WBC 3.9 - 10.3 10e3/uL 5.5 4.5 5.9  Hemoglobin 11.6 - 15.9 g/dL 10.0(L) 9.8(L) 10.3(L)  Hematocrit 34.8 - 46.6 % 32.2(L) 31.5(L) 32.7(L)  Platelets 145 - 400 10e3/uL 253 240 307   . CMP Latest Ref Rng & Units 05/11/2016 04/20/2016 03/30/2016  Glucose 70 - 140 mg/dl 74 72 87  BUN 7.0 - 26.0 mg/dL 15.9 14.9 18.4  Creatinine 0.6 - 1.1 mg/dL 1.0 0.9 1.0  Sodium 136 - 145 mEq/L 139 138 139  Potassium 3.5 - 5.1 mEq/L 4.5 4.3 4.2  Chloride 101 - 111 mmol/L - - -  CO2 22 - 29 mEq/L 25 25 23   Calcium 8.4 - 10.4 mg/dL 9.3 9.2 9.1  Total Protein 6.4 - 8.3 g/dL 7.6 7.2 7.9  Total Bilirubin 0.20 -  1.20 mg/dL 0.44 0.35 0.33  Alkaline Phos 40 - 150 U/L 45 40 47  AST 5 - 34 U/L 17 15 18   ALT 0 - 55 U/L 6 6 8    . Lab Results  Component Value Date   LDH 228 05/11/2016    Radiology .No results found.  ASSESSMENT & PLAN:   83 year old Caucasian female with  #1 Right Renal pelvis metastatic transitional cell carcinoma.  PET scan revealed metastases to the lungs, liver, multiple nodal stations and bones. She is status post 24 cycles of Atezolizumab. PET scan done after 8 cycles of treatment for restaging disease showed significant response to treatment with no evidence of active disease. Patient has no clinical evidence of disease progression at this time. CT chest 06/16/2015 showed no evidence of cancer progression. PET/CT scan done on 10/21/2015- showed no findings for metastatic disease. New right-sided hydronephrosis versus local tumor recurrence needs to be evaluated further. MRI of the kidney showed hyperenhancing the right renal pelvis lesion consistent with concern for local recurrence.  -Patient was recently evaluated by urology for this and deemed not to be a candidate for surgery for local disease control or any other intervention at this time.  -Patient has been seen by radiation oncology and has had input from them regarding the potential role of RT if her rt renal pelvis lesions were to become symptomatic.  PET/CT 03/06/2016 with no findings of metastatic disease. Extensive right-sided hydronephrosis with high activity from excreted FDG obscuring any findings along the collecting system.  #2 h/o skin rash likely related to her Atezolizumab. Minimal grade 1 and topical triamcinolone . Currently quiescent. #3 Cough and some clear secretions likely due to bronchiectasis - stable symptoms. No chest pain no increased shortness of breath or dyspnea on exertion . #4 minimal rectal bleeding due to inflammatory proctitis   -Was previously on  5-ASA suppositories and proctofoam as  per Dr. Wynetta Emery.  But stopped using it due to cost issues. Was then on PO budesonide which she has stopped as well. Plan  --labs stable.  Patient has no new treatment toxicities.  No clinical symptoms suggestive of overt disease recurrence/progression at this time. -We'll continue her Atezolizumab  -Continue Xgeva every 6 weeks -Continue triamcinolone ointment for her skin rash as needed. (  Has not needed this for the last few months). -f/u with GI Dr Wynetta Emery for more definitive evaluation of her rectal bleeding was recommended   #4 history of hypertension   -on Amlodipine    Continue Atezolizumab q3weeks and Alphonse Guild RTC with Dr Irene Limbo in 3 weeks with labs [facility] plan to repeat PET/CT scan in about 4 months from her previous one in 03/2016.   Sullivan Lone MD [place] [facility] Phys[facility]  (Office):       [phone] (Work cell):  [phone] (Fax):           [phone]

## 2016-05-15 ENCOUNTER — Ambulatory Visit (HOSPITAL_COMMUNITY)
Admission: RE | Admit: 2016-05-15 | Discharge: 2016-05-15 | Disposition: A | Discharge status: Home / Self Care | Payer: Medicare Other | Source: Ambulatory Visit | Attending: Gastroenterology | Admitting: Gastroenterology

## 2016-05-15 ENCOUNTER — Encounter (HOSPITAL_COMMUNITY): Payer: Self-pay | Admitting: *Deleted

## 2016-05-15 ENCOUNTER — Encounter (HOSPITAL_COMMUNITY)
Admission: RE | Disposition: A | Discharge status: Home / Self Care | Payer: Self-pay | Source: Ambulatory Visit | Attending: Gastroenterology

## 2016-05-15 DIAGNOSIS — C787 Secondary malignant neoplasm of liver and intrahepatic bile duct: Secondary | ICD-10-CM | POA: Diagnosis not present

## 2016-05-15 DIAGNOSIS — C7951 Secondary malignant neoplasm of bone: Secondary | ICD-10-CM | POA: Insufficient documentation

## 2016-05-15 DIAGNOSIS — I10 Essential (primary) hypertension: Secondary | ICD-10-CM | POA: Insufficient documentation

## 2016-05-15 DIAGNOSIS — C641 Malignant neoplasm of right kidney, except renal pelvis: Secondary | ICD-10-CM | POA: Diagnosis not present

## 2016-05-15 DIAGNOSIS — Z79890 Hormone replacement therapy (postmenopausal): Secondary | ICD-10-CM | POA: Diagnosis not present

## 2016-05-15 DIAGNOSIS — K648 Other hemorrhoids: Secondary | ICD-10-CM | POA: Diagnosis not present

## 2016-05-15 DIAGNOSIS — K921 Melena: Secondary | ICD-10-CM | POA: Diagnosis not present

## 2016-05-15 DIAGNOSIS — Z79899 Other long term (current) drug therapy: Secondary | ICD-10-CM | POA: Insufficient documentation

## 2016-05-15 DIAGNOSIS — C7800 Secondary malignant neoplasm of unspecified lung: Secondary | ICD-10-CM | POA: Diagnosis not present

## 2016-05-15 DIAGNOSIS — K5120 Ulcerative (chronic) proctitis without complications: Secondary | ICD-10-CM | POA: Insufficient documentation

## 2016-05-15 HISTORY — PX: FLEXIBLE SIGMOIDOSCOPY: SHX5431

## 2016-05-15 SURGERY — SIGMOIDOSCOPY, FLEXIBLE
Anesthesia: Moderate Sedation

## 2016-05-15 MED ORDER — SODIUM CHLORIDE 0.9 % IV SOLN: INTRAVENOUS | Status: DC

## 2016-05-15 MED ORDER — FLEET ENEMA 7-19 GM/118ML RE ENEM
ENEMA | RECTAL | Status: AC
  Filled 2016-05-15: qty 1

## 2016-05-15 NOTE — Op Note (Signed)
[facility] Patient Name: Tiffany [name] Procedure Date: 05/15/2016 MRN: [id number] Attending MD: Garlan Fair , MD Date of Birth: 05/03/1924 CSN: [id number] Age: 83 Admit Type: Outpatient Procedure:                Flexible Sigmoidoscopy Indications:              Hematochezia. Chronic ulcerative proctitis. Providers:                Garlan Fair, MD, Cleda Daub, RN, Cletis Athens, Technician Referring MD:              Medicines:                None Complications:            No immediate complications. Estimated Blood Loss:     Estimated blood loss: none. Procedure:                Pre-Anesthesia Assessment:                           - Prior to the procedure, a History and Physical                            was performed, and patient medications and                            allergies were reviewed. The patient's tolerance of                            previous anesthesia was also reviewed. The risks                            and benefits of the procedure and the sedation                            options and risks were discussed with the patient.                            All questions were answered, and informed consent                            was obtained. Prior Anticoagulants: The patient has                            taken no previous anticoagulant or antiplatelet                            agents. ASA Grade Assessment: III - A patient with                            severe systemic disease. After reviewing the risks  and benefits, the patient was deemed in                            satisfactory condition to undergo the procedure.                           After obtaining informed consent, the scope was                            passed under direct vision. The Endoscope was                            introduced through the anus and advanced to the the                            sigmoid colon. The  flexible sigmoidoscopy was                            accomplished without difficulty. The patient                            tolerated the procedure well. The quality of the                            bowel preparation was good. Findings:      The perianal and digital rectal examinations were normal.      A continuous area of nonbleeding ulcerated mucosa with no stigmata of       recent bleeding was present in the rectum consistent with healing       ulcerative proctitis. Large internal hemorrhoid was present.      The exam was otherwise without abnormality. Impression:               - Mucosal ulceration.                           - The examination was otherwise normal.                           - No specimens collected. Moderate Sedation:      No sedation. Recommendation:           - Discharge patient to home (ambulatory). Procedure Code(s):        --- Professional ---                           [phone], Sigmoidoscopy, flexible; diagnostic,                            including collection of specimen(s) by brushing or                            washing, when performed (separate procedure) Diagnosis Code(s):        --- Professional ---                           K63.3, Ulcer of  intestine                           K92.1, Melena (includes Hematochezia) CPT copyright 2016 American Medical Association. All rights reserved. The codes documented in this report are preliminary and upon coder review may  be revised to meet current compliance requirements. Earle Gell, MD Garlan Fair, MD 05/15/2016 2:04:48 PM This report has been signed electronically. Number of Addenda: 0

## 2016-05-15 NOTE — Discharge Instructions (Signed)

## 2016-05-15 NOTE — H&P (Signed)
Problem: Painless hematochezia. Chronic ulcerative proctitis. Transitional cell carcinoma of the right kidney with lung, liver, and bone metastasis. 03/25/2012 diagnostic colonoscopy showed generalized proctitis and left colonic diverticulosis. Rectal mucosal biopsies showed chronic active proctitis without dysplasia or malignancy.  History: The patient is an 83 year old female born 12-24-1924. She has metastatic transitional cell carcinoma of the right kidney and chronic ulcerative proctitis. She is scheduled to undergo diagnostic, unsedated flexible proctosigmoidoscopy.  Past medical history: Hypertension. Osteoarthritis. Gastroesophageal reflux. Chronic ulcerative proctitis. Metastatic transitional cell carcinoma of the right kidney. Hysterectomy. Bilateral salpingo-oophorectomy. Cholecystectomy. Cervical disc surgery. Appendectomy.   Medication allergies: Lisinopril and indomethacin  Exam: The patient is alert and lying comfortably on the endoscopy stretcher. Abdomen is soft and nontender to palpation. Lungs are clear to auscultation. Cardiac exam reveals a regular rhythm.  Plan: Proceed with diagnostic flexible proctosigmoidoscopy.

## 2016-05-18 ENCOUNTER — Encounter (HOSPITAL_COMMUNITY): Payer: Self-pay | Admitting: Gastroenterology

## 2016-06-01 ENCOUNTER — Ambulatory Visit: Payer: Medicare Other

## 2016-06-01 ENCOUNTER — Ambulatory Visit (HOSPITAL_BASED_OUTPATIENT_CLINIC_OR_DEPARTMENT_OTHER): Payer: Medicare Other

## 2016-06-01 ENCOUNTER — Telehealth: Payer: Self-pay | Admitting: Hematology

## 2016-06-01 ENCOUNTER — Ambulatory Visit (HOSPITAL_BASED_OUTPATIENT_CLINIC_OR_DEPARTMENT_OTHER): Payer: Medicare Other | Admitting: Hematology

## 2016-06-01 ENCOUNTER — Encounter: Payer: Self-pay | Admitting: Hematology

## 2016-06-01 ENCOUNTER — Other Ambulatory Visit (HOSPITAL_BASED_OUTPATIENT_CLINIC_OR_DEPARTMENT_OTHER): Payer: Medicare Other

## 2016-06-01 VITALS — BP 137/68 | HR 78 | Temp 98.1°F | Resp 16 | Ht 61.0 in | Wt 127.4 lb

## 2016-06-01 DIAGNOSIS — Z95828 Presence of other vascular implants and grafts: Secondary | ICD-10-CM

## 2016-06-01 DIAGNOSIS — C7951 Secondary malignant neoplasm of bone: Secondary | ICD-10-CM

## 2016-06-01 DIAGNOSIS — I10 Essential (primary) hypertension: Secondary | ICD-10-CM | POA: Diagnosis not present

## 2016-06-01 DIAGNOSIS — C651 Malignant neoplasm of right renal pelvis: Secondary | ICD-10-CM | POA: Diagnosis not present

## 2016-06-01 DIAGNOSIS — C787 Secondary malignant neoplasm of liver and intrahepatic bile duct: Secondary | ICD-10-CM

## 2016-06-01 DIAGNOSIS — C659 Malignant neoplasm of unspecified renal pelvis: Secondary | ICD-10-CM

## 2016-06-01 DIAGNOSIS — Z5112 Encounter for antineoplastic immunotherapy: Secondary | ICD-10-CM | POA: Diagnosis not present

## 2016-06-01 DIAGNOSIS — C778 Secondary and unspecified malignant neoplasm of lymph nodes of multiple regions: Secondary | ICD-10-CM

## 2016-06-01 DIAGNOSIS — R05 Cough: Secondary | ICD-10-CM | POA: Diagnosis not present

## 2016-06-01 DIAGNOSIS — R21 Rash and other nonspecific skin eruption: Secondary | ICD-10-CM

## 2016-06-01 DIAGNOSIS — C7800 Secondary malignant neoplasm of unspecified lung: Secondary | ICD-10-CM | POA: Diagnosis not present

## 2016-06-01 LAB — COMPREHENSIVE METABOLIC PANEL
ALK PHOS: 41 U/L (ref 40–150)
ALT: 7 U/L (ref 0–55)
AST: 18 U/L (ref 5–34)
Albumin: 3.3 g/dL — ABNORMAL LOW (ref 3.5–5.0)
Anion Gap: 7 mEq/L (ref 3–11)
BUN: 14.8 mg/dL (ref 7.0–26.0)
CO2: 24 meq/L (ref 22–29)
Calcium: 9 mg/dL (ref 8.4–10.4)
Chloride: 107 mEq/L (ref 98–109)
Creatinine: 1 mg/dL (ref 0.6–1.1)
EGFR: 51 mL/min/{1.73_m2} — AB (ref >90)
GLUCOSE: 70 mg/dL (ref 70–140)
POTASSIUM: 4.3 meq/L (ref 3.5–5.1)
SODIUM: 139 meq/L (ref 136–145)
Total Bilirubin: 0.43 mg/dL (ref 0.20–1.20)
Total Protein: 7.3 g/dL (ref 6.4–8.3)

## 2016-06-01 LAB — CBC & DIFF AND RETIC
BASO%: 0.4 % (ref 0.0–2.0)
Basophils Absolute: 0 10*3/uL (ref 0.0–0.1)
EOS%: 2.5 % (ref 0.0–7.0)
Eosinophils Absolute: 0.1 10*3/uL (ref 0.0–0.5)
HCT: 31.9 % — ABNORMAL LOW (ref 34.8–46.6)
HGB: 9.9 g/dL — ABNORMAL LOW (ref 11.6–15.9)
Immature Retic Fract: 11.1 % — ABNORMAL HIGH (ref 1.60–10.00)
LYMPH%: 38.5 % (ref 14.0–49.7)
MCH: 28.9 pg (ref 25.1–34.0)
MCHC: 31 g/dL — ABNORMAL LOW (ref 31.5–36.0)
MCV: 93.3 fL (ref 79.5–101.0)
MONO#: 1 10*3/uL — ABNORMAL HIGH (ref 0.1–0.9)
MONO%: 19.5 % — AB (ref 0.0–14.0)
NEUT%: 39.1 % (ref 38.4–76.8)
NEUTROS ABS: 2.1 10*3/uL (ref 1.5–6.5)
Platelets: 287 10*3/uL (ref 145–400)
RBC: 3.42 10*6/uL — AB (ref 3.70–5.45)
RDW: 15.7 % — ABNORMAL HIGH (ref 11.2–14.5)
Retic %: 1.87 % (ref 0.70–2.10)
Retic Ct Abs: 63.95 10*3/uL (ref 33.70–90.70)
WBC: 5.2 10*3/uL (ref 3.9–10.3)
lymph#: 2 10*3/uL (ref 0.9–3.3)

## 2016-06-01 MED ORDER — SODIUM CHLORIDE 0.9% FLUSH
10.0000 mL | INTRAVENOUS | Status: DC | PRN
Start: 2016-06-01 — End: 2016-06-01
  Administered 2016-06-01: 10 mL via INTRAVENOUS
  Filled 2016-06-01: qty 10

## 2016-06-01 MED ORDER — SODIUM CHLORIDE 0.9 % IV SOLN
1200.0000 mg | Freq: Once | INTRAVENOUS | Status: AC
  Administered 2016-06-01: 1200 mg via INTRAVENOUS
  Filled 2016-06-01: qty 20

## 2016-06-01 MED ORDER — SODIUM CHLORIDE 0.9 % IV SOLN
Freq: Once | INTRAVENOUS | Status: AC
  Administered 2016-06-01: 12:00:00 via INTRAVENOUS

## 2016-06-01 MED ORDER — SODIUM CHLORIDE 0.9 % IJ SOLN
10.0000 mL | INTRAMUSCULAR | Status: DC | PRN
  Administered 2016-06-01: 10 mL
  Filled 2016-06-01: qty 10

## 2016-06-01 MED ORDER — HEPARIN SOD (PORK) LOCK FLUSH 100 UNIT/ML IV SOLN
500.0000 [IU] | Freq: Once | INTRAVENOUS | Status: AC | PRN
  Administered 2016-06-01: 500 [IU]
  Filled 2016-06-01: qty 5

## 2016-06-01 NOTE — Telephone Encounter (Signed)
Gave patient calendar and AVS . Lab/flush/treatment alredy scheduled accordingly per 06/01/2016 los. Central radiology will contact patient about Pet Scan. Per 06/01/2016 los Dr. Irene Limbo wants to see patient in 3wk but is not available . Rn Loren to send message to scheduling when Dr. Irene Limbo chooses a day to see patient.

## 2016-06-01 NOTE — Patient Instructions (Signed)
[facility] Discharge Instructions for Patients Receiving Chemotherapy  Today you received the following chemotherapy agents: Tecentriq  To help prevent nausea and vomiting after your treatment, we encourage you to take your nausea medication as directed.    If you develop nausea and vomiting that is not controlled by your nausea medication, call the clinic.   BELOW ARE SYMPTOMS THAT SHOULD BE REPORTED IMMEDIATELY:  *FEVER GREATER THAN 100.5 F  *CHILLS WITH OR WITHOUT FEVER  NAUSEA AND VOMITING THAT IS NOT CONTROLLED WITH YOUR NAUSEA MEDICATION  *UNUSUAL SHORTNESS OF BREATH  *UNUSUAL BRUISING OR BLEEDING  TENDERNESS IN MOUTH AND THROAT WITH OR WITHOUT PRESENCE OF ULCERS  *URINARY PROBLEMS  *BOWEL PROBLEMS  UNUSUAL RASH Items with * indicate a potential emergency and should be followed up as soon as possible.  Feel free to call the clinic you have any questions or concerns. The clinic phone number is [phone].  Please show the CHEMO ALERT CARD at check-in to the Emergency Department and triage nurse.   

## 2016-06-02 NOTE — Progress Notes (Signed)
Hematology oncology clinic follow-up.  Date of service. 06/01/2016  Patient Care Team: [name], MD as PCP - General (Internal Medicine)   Urologist: Dr. 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  Diagnosis:  Widely metastatic transitional cell carcinoma from the right renal pelvis.  Treatment -Atezolizumab IV q3weeks status post 24 cycles here for her 25h cycle. Alphonse Guild for bone mets  HISTORY OF PRESENTING ILLNESS: Please see my previous notes for details of initial presentation.  INTERVAL HISTORY  Ms Bendick is here for her scheduled follow-up prior to her next cycle of Atezolizumab. She had an acute URTI and has started taking Zpak as prescribed by pulmonology and notes that her URI symptoms have nearly resolved. No fevers/chills. No overt rectal bleeding. She had her sigmoidoscopy with Dr Hassell Done and was noted to have ulcerative proctitis as previously no significant worsening. No other acute new focal lesions.   MEDICAL HISTORY:  Past Medical History:  Diagnosis Date  . Arthritis     knees 03-10-12 had Cortisone injection  . Cancer [facility]) june 2016   metastatic  . DDD (degenerative disc disease), cervical   . Edema leg    feet and ankles  . Esophagus disorder    Had esophagus stretched  . GERD (gastroesophageal reflux disease)    Benign stricture dilated in 2011  . Gout    Patient notes she has possible gout but has not been on chronic medications for this  . Headache(784.0)   . Hypertension   . Occipital neuralgia   . Occipital neuralgia    Related to cervical degenerative disc disease  . Peptic ulcer disease    Previous history in the remote past  . Ulcerative proctitis ([place])   . Ulcerative proctitis ([place]) 2014    SURGICAL HISTORY: Past Surgical History:  Procedure Laterality Date  . ABDOMINAL  HYSTERECTOMY    . APPENDECTOMY    . CHOLECYSTECTOMY    . COLONOSCOPY WITH PROPOFOL  03/25/2012   Procedure: COLONOSCOPY WITH PROPOFOL;  Surgeon: Garlan Fair, MD;  [facility];  Service: Endoscopy;  Laterality: N/A;  . DILATION AND CURETTAGE OF UTERUS    . ESOPHAGEAL DILATION     For benign stricture in 2011  . ESOPHAGOGASTRODUODENOSCOPY    . FLEXIBLE SIGMOIDOSCOPY N/A 04/26/2014   Procedure: FLEXIBLE SIGMOIDOSCOPY - UnSedated;  Surgeon: Garlan Fair, MD;  [facility];  Service: Endoscopy;  Laterality: N/A;  . FLEXIBLE SIGMOIDOSCOPY N/A 01/24/2015   Procedure: FLEXIBLE SIGNMOIDOSCOPY W/ FLEET ENEMIA;  Surgeon: Garlan Fair, MD;  [facility];  Service: Endoscopy;  Laterality: N/A;  . FLEXIBLE SIGMOIDOSCOPY N/A 05/15/2016   Procedure: FLEXIBLE SIGMOIDOSCOPY;  Surgeon: Garlan Fair, MD;  [facility];  Service: Endoscopy;  Laterality: N/A;  pt needs to ahve an enema upon arrival   . NECK SURGERY     20 years ago  . TOTAL ABDOMINAL HYSTERECTOMY W/ BILATERAL SALPINGOOPHORECTOMY     At age 89 due to miscarriage and retained products of conception causing significant uterine bleeding. Patient has been on estrogen replacement therapy since then.    SOCIAL HISTORY: Social History   Social History  . Marital status: Married    Spouse name: N/A  . Number of children: N/A  . Years of education: N/A   Occupational History  . Not on file.   Social History Main Topics  .  Smoking status: Former Smoker    Quit date: 11/01/1983  . Smokeless tobacco: Never Used  . Alcohol use Yes     Comment: occ  . Drug use: No  . Sexual activity: No   Other Topics Concern  . Not on file   Social History Narrative  . No narrative on file  Retired as Conservation officer, nature for [employer]. She is very well spoken and intelligent individual and exhibits a keen knowledge of her medical conditions.  FAMILY HISTORY: Family History  Problem  Relation Age of Onset  . Lung cancer Sister   . Prostate cancer Brother   . Uterine cancer Maternal Aunt   . Breast cancer Paternal Grandmother   . Hodgkin's lymphoma Sister     ALLERGIES:  is allergic to nitrofurantoin; indomethacin; and lisinopril.  MEDICATIONS:  Current Outpatient Prescriptions  Medication Sig Dispense Refill  . acetaminophen (TYLENOL) 650 MG CR tablet Take 1,300 mg by mouth 2 (two) times daily.    Marland Kitchen amLODipine (NORVASC) 5 MG tablet Take 5 mg by mouth daily.    Marland Kitchen azithromycin (ZITHROMAX) 250 MG tablet Take 250-500 tablets by mouth daily. Take 500mg s on day 1 then take 250mg s daily for 4 more days as needed for cough  1  . budesonide (ENTOCORT EC) 3 MG 24 hr capsule Take 2 capsules (6 mg total) by mouth daily. followup with your GI doctor to optimize further management. (Patient taking differently: Take 6 mg by mouth daily as needed (Inflammatory procto-colitis). followup with your GI doctor to optimize further management.) 60 capsule 0  . CANASA 1000 MG suppository Place 1,000 mg rectally daily as needed (ulcers).   11  . COLCRYS 0.6 MG tablet Take 0.6 mg by mouth once daily as needed for gout  1  . estradiol (ESTRACE) 0.5 MG tablet Take 0.5 mg by mouth daily.  1  . guaiFENesin (MUCINEX) 600 MG 12 hr tablet Take 600 mg by mouth 2 (two) times daily.     Marland Kitchen Histamine Dihydrochloride (AUSTRALIAN DREAM ARTHRITIS EX) Apply 1 application topically 2 (two) times daily.    . mirtazapine (REMERON) 15 MG tablet TAKE ONE TABLET BY MOUTH AT BEDTIME 90 tablet 0  . PRESCRIPTION MEDICATION Chemo card    . Respiratory Therapy Supplies (FLUTTER) DEVI 1 each by Does not apply route daily. 1 each 0  . senna-docusate (SENNA S) 8.6-50 MG per tablet Take 2 tablets by mouth at bedtime. (Patient taking differently: Take 1-2 tablets by mouth daily as needed for mild constipation. ) 60 tablet 1  . triamcinolone ointment (KENALOG) 0.5 % Apply 1 application topically 2 (two) times daily. (Patient  taking differently: Apply 1 application topically 2 (two) times daily as needed (rash). ) 30 g 0   No current facility-administered medications for this visit.    REVIEW OF SYSTEMS:    10 point review of systems done and is negative except as noted above  PHYSICAL EXAMINATION: ECOG PERFORMANCE STATUS: 1  Vitals:   06/01/16 1022  BP: 137/68  Pulse: 78  Resp: 16  Temp: 98.1 F (36.7 C)   Filed Weights   06/01/16 1022  Weight: 127 lb 6.4 oz (57.8 kg)   GENERAL elderly Caucasian female, alert, no distress and comfortable SKIN: resolved skin rashes EYES: normal, conjunctiva are pink and non-injected, sclera clear OROPHARYNX:no exudate, no erythema and lips, buccal mucosa, and tongue normal  NECK: supple, thyroid normal size, non-tender, without nodularity LYMPH:  no palpable lymphadenopathy in the cervical, axillary  or inguinal LUNGS: few bibasilar rales, scattered rhonci, good air entry. HEART: regular rate & rhythm and no murmurs and no lower extremity edema ABDOMEN:abdomen soft, non-tender and normal bowel sounds Musculoskeletal:no cyanosis of digits and no clubbing  PSYCH: alert & oriented x 3 with fluent speech NEURO: no focal motor/sensory deficits  LABORATORY DATA:  CBC Latest Ref Rng & Units 06/01/2016 05/11/2016 04/20/2016  WBC 3.9 - 10.3 10e3/uL 5.2 5.5 4.5  Hemoglobin 11.6 - 15.9 g/dL 9.9(L) 10.0(L) 9.8(L)  Hematocrit 34.8 - 46.6 % 31.9(L) 32.2(L) 31.5(L)  Platelets 145 - 400 10e3/uL 287 253 240   . CMP Latest Ref Rng & Units 06/01/2016 05/11/2016 04/20/2016  Glucose 70 - 140 mg/dl 70 74 72  BUN 7.0 - 26.0 mg/dL 14.8 15.9 14.9  Creatinine 0.6 - 1.1 mg/dL 1.0 1.0 0.9  Sodium 136 - 145 mEq/L 139 139 138  Potassium 3.5 - 5.1 mEq/L 4.3 4.5 4.3  Chloride 101 - 111 mmol/L - - -  CO2 22 - 29 mEq/L 24 25 25   Calcium 8.4 - 10.4 mg/dL 9.0 9.3 9.2  Total Protein 6.4 - 8.3 g/dL 7.3 7.6 7.2  Total Bilirubin 0.20 - 1.20 mg/dL 0.43 0.44 0.35  Alkaline Phos 40 - 150 U/L 41 45 40    AST 5 - 34 U/L 18 17 15   ALT 0 - 55 U/L 7 6 6    . Lab Results  Component Value Date   LDH 228 05/11/2016    Radiology .No results found.  ASSESSMENT & PLAN:   83 year old Caucasian female with  #1 Right Renal pelvis metastatic transitional cell carcinoma.  PET scan revealed metastases to the lungs, liver, multiple nodal stations and bones. She is status post 24 cycles of Atezolizumab. PET scan done after 8 cycles of treatment for restaging disease showed significant response to treatment with no evidence of active disease. Patient has no clinical evidence of disease progression at this time. CT chest 06/16/2015 showed no evidence of cancer progression. PET/CT scan done on 10/21/2015- showed no findings for metastatic disease. New right-sided hydronephrosis versus local tumor recurrence needs to be evaluated further. MRI of the kidney showed hyperenhancing the right renal pelvis lesion consistent with concern for local recurrence.  PET/CT 03/06/2016 with no findings of metastatic disease. Extensive right-sided hydronephrosis with high activity from excreted FDG obscuring any findings along the collecting system. Plan --labs stable.  Patient has no new treatment toxicities.  No clinical symptoms suggestive of overt disease recurrence/progression at this time. -We'll continue her Atezolizumab  -Continue Xgeva every 6 weeks -Continue triamcinolone ointment for her skin rash as needed. (Has not needed this for the last few months). -we will get a rpt PET/CT prior to next treatment to reassess treatment response to plan further management. -Patient has been seen by radiation oncology and has had input from them regarding the potential role of RT if her rt renal pelvis lesions were to become symptomatic.   #2 h/o skin rash likely related to her Atezolizumab. Minimal grade 1 and topical triamcinolone . Currently quiescent.  #3 Cough and some clear secretions likely due to bronchiectasis .  Followed by Dr Melvyn Novas. . No chest pain no increased shortness of breath or dyspnea on exertion . Plan -Patient had a recent URI -- symptoms resolving with Zpak. 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 Wynetta Emery.  But stopped using it  due to cost issues. Was then on PO budesonide which she has stopped as well. Plan  --had recent sigmoidscopy with Dr Wynetta Emery - showed stable ulcerative proctitis  -will use po budesonide 3mg  po daily on as needed intermittent basis based on symptoms.  #4 history of hypertension   -on Amlodipine    Continue Atezolizumab q3weeks and Alphonse Guild RTC with Dr Irene Limbo in 3 weeks with labs and PET/CT    Sullivan Lone MD [place] [facility]/Oncology Physician [facility]  (Office):       [phone] (Work cell):  [phone] (Fax):           [phone]

## 2016-06-06 ENCOUNTER — Other Ambulatory Visit: Payer: Self-pay | Admitting: *Deleted

## 2016-06-07 ENCOUNTER — Other Ambulatory Visit: Payer: Self-pay | Admitting: *Deleted

## 2016-06-13 ENCOUNTER — Encounter (HOSPITAL_COMMUNITY)
Admission: RE | Admit: 2016-06-13 | Discharge: 2016-06-13 | Disposition: A | Discharge status: Home / Self Care | Payer: Medicare Other | Source: Ambulatory Visit | Attending: Hematology | Admitting: Hematology

## 2016-06-13 ENCOUNTER — Ambulatory Visit (HOSPITAL_BASED_OUTPATIENT_CLINIC_OR_DEPARTMENT_OTHER): Payer: Medicare Other | Admitting: Hematology

## 2016-06-13 ENCOUNTER — Encounter: Payer: Self-pay | Admitting: Hematology

## 2016-06-13 VITALS — BP 145/77 | HR 81 | Temp 97.5°F | Resp 17 | Wt 123.8 lb

## 2016-06-13 DIAGNOSIS — C7951 Secondary malignant neoplasm of bone: Secondary | ICD-10-CM | POA: Diagnosis not present

## 2016-06-13 DIAGNOSIS — C787 Secondary malignant neoplasm of liver and intrahepatic bile duct: Secondary | ICD-10-CM

## 2016-06-13 DIAGNOSIS — C7800 Secondary malignant neoplasm of unspecified lung: Secondary | ICD-10-CM

## 2016-06-13 DIAGNOSIS — C679 Malignant neoplasm of bladder, unspecified: Secondary | ICD-10-CM | POA: Diagnosis not present

## 2016-06-13 DIAGNOSIS — R05 Cough: Secondary | ICD-10-CM

## 2016-06-13 DIAGNOSIS — I10 Essential (primary) hypertension: Secondary | ICD-10-CM

## 2016-06-13 DIAGNOSIS — C778 Secondary and unspecified malignant neoplasm of lymph nodes of multiple regions: Secondary | ICD-10-CM

## 2016-06-13 DIAGNOSIS — C659 Malignant neoplasm of unspecified renal pelvis: Secondary | ICD-10-CM | POA: Diagnosis not present

## 2016-06-13 DIAGNOSIS — C651 Malignant neoplasm of right renal pelvis: Secondary | ICD-10-CM | POA: Diagnosis not present

## 2016-06-13 LAB — GLUCOSE, CAPILLARY: GLUCOSE-CAPILLARY: 85 mg/dL (ref 65–99)

## 2016-06-13 MED ORDER — FLUDEOXYGLUCOSE F - 18 (FDG) INJECTION
6.3300 | Freq: Once | INTRAVENOUS | Status: AC | PRN
  Administered 2016-06-13: 6.33 via INTRAVENOUS

## 2016-06-13 NOTE — Progress Notes (Signed)
Hematology oncology clinic follow-up.  Date of service. 06/13/2016  Patient Care Team: [name], MD as PCP - General (Internal Medicine)   Urologist: Dr. 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  Diagnosis:  Widely metastatic transitional cell carcinoma from the right renal pelvis.  Treatment -Atezolizumab IV q3weeks status post 26 cycles here for her 27h cycle. Alphonse Guild for bone mets  HISTORY OF PRESENTING ILLNESS: Please see my previous notes for details of initial presentation.  INTERVAL HISTORY  Ms Zaun is here for her scheduled follow-up prior to her next cycle of Atezolizumab. She had a f/u PET/CT which shows no new metastatic lesions and possible from increase in his rt renal pelvis mass. No new symptoms. Cough stable. No diarrhea no rectal bleeding.  MEDICAL HISTORY:  Past Medical History:  Diagnosis Date  . Arthritis     knees 03-10-12 had Cortisone injection  . Cancer [facility]) june 2016   metastatic  . DDD (degenerative disc disease), cervical   . Edema leg    feet and ankles  . Esophagus disorder    Had esophagus stretched  . GERD (gastroesophageal reflux disease)    Benign stricture dilated in 2011  . Gout    Patient notes she has possible gout but has not been on chronic medications for this  . Headache(784.0)   . Hypertension   . Occipital neuralgia   . Occipital neuralgia    Related to cervical degenerative disc disease  . Peptic ulcer disease    Previous history in the remote past  . Ulcerative proctitis ([place])   . Ulcerative proctitis ([place]) 2014    SURGICAL HISTORY: Past Surgical History:  Procedure Laterality Date  . ABDOMINAL HYSTERECTOMY    . APPENDECTOMY    . CHOLECYSTECTOMY    . COLONOSCOPY WITH PROPOFOL  03/25/2012   Procedure: COLONOSCOPY WITH PROPOFOL;  Surgeon: Garlan Fair,  MD;  [facility];  Service: Endoscopy;  Laterality: N/A;  . DILATION AND CURETTAGE OF UTERUS    . ESOPHAGEAL DILATION     For benign stricture in 2011  . ESOPHAGOGASTRODUODENOSCOPY    . FLEXIBLE SIGMOIDOSCOPY N/A 04/26/2014   Procedure: FLEXIBLE SIGMOIDOSCOPY - UnSedated;  Surgeon: Garlan Fair, MD;  [facility];  Service: Endoscopy;  Laterality: N/A;  . FLEXIBLE SIGMOIDOSCOPY N/A 01/24/2015   Procedure: FLEXIBLE SIGNMOIDOSCOPY W/ FLEET ENEMIA;  Surgeon: Garlan Fair, MD;  [facility];  Service: Endoscopy;  Laterality: N/A;  . FLEXIBLE SIGMOIDOSCOPY N/A 05/15/2016   Procedure: FLEXIBLE SIGMOIDOSCOPY;  Surgeon: Garlan Fair, MD;  [facility];  Service: Endoscopy;  Laterality: N/A;  pt needs to ahve an enema upon arrival   . NECK SURGERY     20 years ago  . TOTAL ABDOMINAL HYSTERECTOMY W/ BILATERAL SALPINGOOPHORECTOMY     At age 89 due to miscarriage and retained products of conception causing significant uterine bleeding. Patient has been on estrogen replacement therapy since then.    SOCIAL HISTORY: Social History   Social History  . Marital status: Married    Spouse name: N/A  . Number of children: N/A  . Years of education: N/A   Occupational History  . Not on file.   Social History Main Topics  . Smoking status: Former Smoker    Quit date: 11/01/1983  . Smokeless tobacco: Never Used  . Alcohol use Yes  Comment: occ  . Drug use: No  . Sexual activity: No   Other Topics Concern  . Not on file   Social History Narrative  . No narrative on file  Retired as Conservation officer, nature for [employer]. She is very well spoken and intelligent individual and exhibits a keen knowledge of her medical conditions.  FAMILY HISTORY: Family History  Problem Relation Age of Onset  . Lung cancer Sister   . Prostate cancer Brother   . Uterine cancer Maternal Aunt   . Breast cancer Paternal Grandmother   . Hodgkin's  lymphoma Sister     ALLERGIES:  is allergic to nitrofurantoin; indomethacin; and lisinopril.  MEDICATIONS:  Current Outpatient Prescriptions  Medication Sig Dispense Refill  . acetaminophen (TYLENOL) 650 MG CR tablet Take 1,300 mg by mouth 2 (two) times daily.    Marland Kitchen amLODipine (NORVASC) 5 MG tablet Take 5 mg by mouth daily.    Marland Kitchen azithromycin (ZITHROMAX) 250 MG tablet Take 250-500 tablets by mouth daily. Take 500mg s on day 1 then take 250mg s daily for 4 more days as needed for cough  1  . budesonide (ENTOCORT EC) 3 MG 24 hr capsule Take 2 capsules (6 mg total) by mouth daily. followup with your GI doctor to optimize further management. (Patient taking differently: Take 6 mg by mouth daily as needed (Inflammatory procto-colitis). followup with your GI doctor to optimize further management.) 60 capsule 0  . CANASA 1000 MG suppository Place 1,000 mg rectally daily as needed (ulcers).   11  . COLCRYS 0.6 MG tablet Take 0.6 mg by mouth once daily as needed for gout  1  . estradiol (ESTRACE) 0.5 MG tablet Take 0.5 mg by mouth daily.  1  . guaiFENesin (MUCINEX) 600 MG 12 hr tablet Take 600 mg by mouth 2 (two) times daily.     Marland Kitchen Histamine Dihydrochloride (AUSTRALIAN DREAM ARTHRITIS EX) Apply 1 application topically 2 (two) times daily.    . mirtazapine (REMERON) 15 MG tablet TAKE ONE TABLET BY MOUTH AT BEDTIME 90 tablet 0  . PRESCRIPTION MEDICATION Chemo card    . Respiratory Therapy Supplies (FLUTTER) DEVI 1 each by Does not apply route daily. 1 each 0  . senna-docusate (SENNA S) 8.6-50 MG per tablet Take 2 tablets by mouth at bedtime. (Patient taking differently: Take 1-2 tablets by mouth daily as needed for mild constipation. ) 60 tablet 1  . triamcinolone ointment (KENALOG) 0.5 % Apply 1 application topically 2 (two) times daily. (Patient taking differently: Apply 1 application topically 2 (two) times daily as needed (rash). ) 30 g 0   No current facility-administered medications for this visit.     REVIEW OF SYSTEMS:    10 point review of systems done and is negative except as noted above  PHYSICAL EXAMINATION: ECOG PERFORMANCE STATUS: 1  Vitals:   06/13/16 1023  BP: (!) 145/77  Pulse: 81  Resp: 17  Temp: 97.5 F (36.4 C)   Filed Weights   06/13/16 1023  Weight: 123 lb 12.8 oz (56.2 kg)   GENERAL elderly Caucasian female, alert, no distress and comfortable SKIN: resolved skin rashes EYES: normal, conjunctiva are pink and non-injected, sclera clear OROPHARYNX:no exudate, no erythema and lips, buccal mucosa, and tongue normal  NECK: supple, thyroid normal size, non-tender, without nodularity LYMPH:  no palpable lymphadenopathy in the cervical, axillary or inguinal LUNGS: few bibasilar rales, scattered rhonci, good air entry. HEART: regular rate & rhythm and no murmurs and no lower extremity edema  ABDOMEN:abdomen soft, non-tender and normal bowel sounds Musculoskeletal:no cyanosis of digits and no clubbing  PSYCH: alert & oriented x 3 with fluent speech NEURO: no focal motor/sensory deficits  LABORATORY DATA:  CBC Latest Ref Rng & Units 06/01/2016 05/11/2016 04/20/2016  WBC 3.9 - 10.3 10e3/uL 5.2 5.5 4.5  Hemoglobin 11.6 - 15.9 g/dL 9.9(L) 10.0(L) 9.8(L)  Hematocrit 34.8 - 46.6 % 31.9(L) 32.2(L) 31.5(L)  Platelets 145 - 400 10e3/uL 287 253 240   . CMP Latest Ref Rng & Units 06/01/2016 05/11/2016 04/20/2016  Glucose 70 - 140 mg/dl 70 74 72  BUN 7.0 - 26.0 mg/dL 14.8 15.9 14.9  Creatinine 0.6 - 1.1 mg/dL 1.0 1.0 0.9  Sodium 136 - 145 mEq/L 139 139 138  Potassium 3.5 - 5.1 mEq/L 4.3 4.5 4.3  Chloride 101 - 111 mmol/L - - -  CO2 22 - 29 mEq/L 24 25 25   Calcium 8.4 - 10.4 mg/dL 9.0 9.3 9.2  Total Protein 6.4 - 8.3 g/dL 7.3 7.6 7.2  Total Bilirubin 0.20 - 1.20 mg/dL 0.43 0.44 0.35  Alkaline Phos 40 - 150 U/L 41 45 40  AST 5 - 34 U/L 18 17 15   ALT 0 - 55 U/L 7 6 6    . Lab Results  Component Value Date   LDH 228 05/11/2016    Radiology .Nm Pet Image Restag (ps)  Skull Base To Thigh  Result Date: 06/13/2016 CLINICAL DATA:  Subsequent treatment strategy for transitional cell carcinoma EXAM: NUCLEAR MEDICINE PET SKULL BASE TO THIGH TECHNIQUE: 6.33 mCi F-18 FDG was injected intravenously. Full-ring PET imaging was performed from the skull base to thigh after the radiotracer. CT data was obtained and used for attenuation correction and anatomic localization. FASTING BLOOD GLUCOSE:  Value: 85 mg/dl COMPARISON:  03/06/2016 FINDINGS: NECK No hypermetabolic lymph nodes in the neck. CHEST No hypermetabolic mediastinal or hilar nodes. Aortic atherosclerosis. Three vessel coronary artery calcification noted. Bilateral lower lobe bronchial wall thickening and bronchiectasis is identified and likely the sequelae of chronic inflammation/ infection. Nonspecific perifissural nodular density in the right middle lobe measures 6 mm and is too small to characterize. New from previous exam. No suspicious pulmonary nodules on the CT scan. ABDOMEN/PELVIS No abnormal hypermetabolic activity within the liver, pancreas, adrenal glands, or spleen. Increase in size of right kidney and dilated right renal collecting system. This currently measures 6 x 5.3 cm, image 110 of series 4. Previously 5.3 x 4.9 cm. Increase radiotracer uptake in this area is favored to represent a combination of tumor progression and superimposed excreted urine. No hypermetabolic lymph nodes in the abdomen or pelvis. SKELETON No focal hypermetabolic activity to suggest skeletal metastasis. IMPRESSION: 1. Interval increase in size of right kidney. Findings are suspicious for progression of residual/recurrent urothelial carcinoma with further dilatation of the right renal collecting system. 2. No evidence for hypermetabolic metastasis. 3. Chronic changes within both lungs likely the sequelae of recurrent inflammation/infection. 4. Aortic atherosclerosis and multi vessel coronary artery calcification. Electronically Signed   By:  Kerby Moors M.D.   On: 06/13/2016 10:20    ASSESSMENT & PLAN:   83 year old Caucasian female with  #1 Right Renal pelvis metastatic transitional cell carcinoma.   Initial PET scan revealed metastases to the lungs, liver, multiple nodal stations and bones.  PET scan done after 8 cycles of treatment for restaging disease showed significant response to treatment with no evidence of active disease. Patient has no clinical evidence of disease progression at this time. CT chest 06/16/2015 showed no  evidence of cancer progression. PET/CT scan done on 10/21/2015- showed no findings for metastatic disease. New right-sided hydronephrosis versus local tumor recurrence needs to be evaluated further. MRI of the kidney showed hyperenhancing the right renal pelvis lesion consistent with concern for local recurrence.  PET/CT 03/06/2016 with no findings of metastatic disease. Extensive right-sided hydronephrosis with high activity from excreted FDG obscuring any findings along the collecting system.  PET/CT  06/13/2016- Interval increase in size of right kidney. Findings are suspicious for progression of residual/recurrent urothelial carcinoma with further dilatation of the right renal collecting system. 2. No evidence for hypermetabolic metastasis.   Plan -- Patient has no new treatment toxicities.  No clinical symptoms suggestive of overt disease recurrence/progression at this time. -PET/CT results were discussed with the patient. She has some local progression of the tumor in the right pelvis but no new or progressive metastatic lesions. -We'll continue her Atezolizumab  -Continue Xgeva every 6 weeks -Patient has been seen by radiation oncology and has had input from them regarding the potential role of RT if her rt renal pelvis lesions were to become symptomatic.   #2 h/o skin rash likely related to her Atezolizumab. Minimal grade 1 and topical triamcinolone . Currently quiescent.  #3 Cough and some  clear secretions likely due to bronchiectasis . Followed by Dr Melvyn Novas. . No chest pain no increased shortness of breath or dyspnea on exertion . 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 Wynetta Emery.  But stopped using it due to cost issues. Was then on PO budesonide which she has stopped as well. Plan  --had recent sigmoidscopy with Dr Wynetta Emery - showed stable ulcerative proctitis  -will use po budesonide 3mg  po daily on as needed intermittent basis based on symptoms.  #4 history of hypertension   -on Amlodipine   -continue Atezolizumab as per schedule next dose 06/22/2016. -RTC with Dr Irene Limbo on 07/13/2016 with labs with [place] MD [place] [facility]/On[facility] Physician [facility]  (Office):       [phone] (Work cell):  [phone] (Fax):           [phone]

## 2016-06-20 ENCOUNTER — Telehealth: Payer: Self-pay | Admitting: *Deleted

## 2016-06-20 NOTE — Telephone Encounter (Signed)
"  I received an appointment reminder for treatment at 12:30 pm.  Could someone call to clarify.Do I only have treatment this day?" Returned call with instructions to come in for lab and flush appointments beginning at 11:15 am."  Thank you, that's what I thought.  The reminder call only mentioned a 12:30 appointment time.  No further questions.

## 2016-06-21 ENCOUNTER — Other Ambulatory Visit: Payer: Self-pay | Admitting: *Deleted

## 2016-06-21 ENCOUNTER — Telehealth: Payer: Self-pay | Admitting: Hematology

## 2016-06-21 DIAGNOSIS — C659 Malignant neoplasm of unspecified renal pelvis: Secondary | ICD-10-CM

## 2016-06-21 NOTE — Telephone Encounter (Signed)
Patient is aware of new added appt and will pick up new schedule when she comes in for her treatment 3/23.

## 2016-06-22 ENCOUNTER — Ambulatory Visit (HOSPITAL_BASED_OUTPATIENT_CLINIC_OR_DEPARTMENT_OTHER): Payer: Medicare Other

## 2016-06-22 ENCOUNTER — Ambulatory Visit: Payer: Medicare Other

## 2016-06-22 ENCOUNTER — Other Ambulatory Visit (HOSPITAL_BASED_OUTPATIENT_CLINIC_OR_DEPARTMENT_OTHER): Payer: Medicare Other

## 2016-06-22 VITALS — BP 150/62 | HR 64 | Temp 97.7°F | Resp 18

## 2016-06-22 DIAGNOSIS — C7951 Secondary malignant neoplasm of bone: Secondary | ICD-10-CM

## 2016-06-22 DIAGNOSIS — C659 Malignant neoplasm of unspecified renal pelvis: Secondary | ICD-10-CM

## 2016-06-22 DIAGNOSIS — C651 Malignant neoplasm of right renal pelvis: Secondary | ICD-10-CM | POA: Diagnosis not present

## 2016-06-22 DIAGNOSIS — Z5112 Encounter for antineoplastic immunotherapy: Secondary | ICD-10-CM | POA: Diagnosis not present

## 2016-06-22 DIAGNOSIS — Z95828 Presence of other vascular implants and grafts: Secondary | ICD-10-CM

## 2016-06-22 DIAGNOSIS — Z79899 Other long term (current) drug therapy: Secondary | ICD-10-CM

## 2016-06-22 DIAGNOSIS — C787 Secondary malignant neoplasm of liver and intrahepatic bile duct: Secondary | ICD-10-CM

## 2016-06-22 LAB — CBC & DIFF AND RETIC
BASO%: 0.2 % (ref 0.0–2.0)
Basophils Absolute: 0 10*3/uL (ref 0.0–0.1)
EOS%: 1.9 % (ref 0.0–7.0)
Eosinophils Absolute: 0.1 10*3/uL (ref 0.0–0.5)
HCT: 31.2 % — ABNORMAL LOW (ref 34.8–46.6)
HGB: 9.9 g/dL — ABNORMAL LOW (ref 11.6–15.9)
Immature Retic Fract: 14.1 % — ABNORMAL HIGH (ref 1.60–10.00)
LYMPH#: 2.4 10*3/uL (ref 0.9–3.3)
LYMPH%: 44.9 % (ref 14.0–49.7)
MCH: 28.9 pg (ref 25.1–34.0)
MCHC: 31.7 g/dL (ref 31.5–36.0)
MCV: 91 fL (ref 79.5–101.0)
MONO#: 0.8 10*3/uL (ref 0.1–0.9)
MONO%: 15.1 % — ABNORMAL HIGH (ref 0.0–14.0)
NEUT#: 2 10*3/uL (ref 1.5–6.5)
NEUT%: 37.9 % — ABNORMAL LOW (ref 38.4–76.8)
NRBC: 0 % (ref 0–0)
Platelets: 290 10*3/uL (ref 145–400)
RBC: 3.43 10*6/uL — ABNORMAL LOW (ref 3.70–5.45)
RDW: 15.1 % — ABNORMAL HIGH (ref 11.2–14.5)
RETIC %: 1.48 % (ref 0.70–2.10)
Retic Ct Abs: 50.76 10*3/uL (ref 33.70–90.70)
WBC: 5.4 10*3/uL (ref 3.9–10.3)

## 2016-06-22 LAB — COMPREHENSIVE METABOLIC PANEL
ALT: 8 U/L (ref 0–55)
AST: 14 U/L (ref 5–34)
Albumin: 3.2 g/dL — ABNORMAL LOW (ref 3.5–5.0)
Alkaline Phosphatase: 42 U/L (ref 40–150)
Anion Gap: 7 mEq/L (ref 3–11)
BUN: 16.6 mg/dL (ref 7.0–26.0)
CALCIUM: 9.3 mg/dL (ref 8.4–10.4)
CHLORIDE: 107 meq/L (ref 98–109)
CO2: 24 mEq/L (ref 22–29)
Creatinine: 0.9 mg/dL (ref 0.6–1.1)
EGFR: 55 mL/min/{1.73_m2} — ABNORMAL LOW (ref >90)
Glucose: 78 mg/dl (ref 70–140)
POTASSIUM: 4.2 meq/L (ref 3.5–5.1)
SODIUM: 139 meq/L (ref 136–145)
Total Bilirubin: 0.37 mg/dL (ref 0.20–1.20)
Total Protein: 7.4 g/dL (ref 6.4–8.3)

## 2016-06-22 LAB — TSH: TSH: 0.878 m[IU]/L (ref 0.308–3.960)

## 2016-06-22 MED ORDER — DENOSUMAB 120 MG/1.7ML ~~LOC~~ SOLN
120.0000 mg | Freq: Once | SUBCUTANEOUS | Status: AC
  Administered 2016-06-22: 120 mg via SUBCUTANEOUS
  Filled 2016-06-22: qty 1.7

## 2016-06-22 MED ORDER — SODIUM CHLORIDE 0.9 % IV SOLN
Freq: Once | INTRAVENOUS | Status: AC
  Administered 2016-06-22: 13:00:00 via INTRAVENOUS

## 2016-06-22 MED ORDER — HEPARIN SOD (PORK) LOCK FLUSH 100 UNIT/ML IV SOLN
500.0000 [IU] | Freq: Once | INTRAVENOUS | Status: AC | PRN
  Administered 2016-06-22: 500 [IU]
  Filled 2016-06-22: qty 5

## 2016-06-22 MED ORDER — SODIUM CHLORIDE 0.9 % IJ SOLN
10.0000 mL | Freq: Once | INTRAMUSCULAR | Status: AC
  Administered 2016-06-22: 10 mL
  Filled 2016-06-22: qty 10

## 2016-06-22 MED ORDER — ATEZOLIZUMAB CHEMO INJECTION 1200 MG/20ML
1200.0000 mg | Freq: Once | INTRAVENOUS | Status: AC
  Administered 2016-06-22: 1200 mg via INTRAVENOUS
  Filled 2016-06-22: qty 20

## 2016-06-22 MED ORDER — SODIUM CHLORIDE 0.9 % IJ SOLN
10.0000 mL | INTRAMUSCULAR | Status: DC | PRN
  Administered 2016-06-22: 10 mL
  Filled 2016-06-22: qty 10

## 2016-06-22 NOTE — Patient Instructions (Signed)
[facility] Discharge Instructions for Patients Receiving Chemotherapy  Today you received the following chemotherapy agents: Tecentriq  To help prevent nausea and vomiting after your treatment, we encourage you to take your nausea medication as directed.    If you develop nausea and vomiting that is not controlled by your nausea medication, call the clinic.   BELOW ARE SYMPTOMS THAT SHOULD BE REPORTED IMMEDIATELY:  *FEVER GREATER THAN 100.5 F  *CHILLS WITH OR WITHOUT FEVER  NAUSEA AND VOMITING THAT IS NOT CONTROLLED WITH YOUR NAUSEA MEDICATION  *UNUSUAL SHORTNESS OF BREATH  *UNUSUAL BRUISING OR BLEEDING  TENDERNESS IN MOUTH AND THROAT WITH OR WITHOUT PRESENCE OF ULCERS  *URINARY PROBLEMS  *BOWEL PROBLEMS  UNUSUAL RASH Items with * indicate a potential emergency and should be followed up as soon as possible.  Feel free to call the clinic you have any questions or concerns. The clinic phone number is [phone].  Please show the CHEMO ALERT CARD at check-in to the Emergency Department and triage nurse.   

## 2016-07-03 ENCOUNTER — Other Ambulatory Visit: Payer: Self-pay | Admitting: Hematology

## 2016-07-13 ENCOUNTER — Telehealth: Payer: Self-pay | Admitting: Hematology

## 2016-07-13 ENCOUNTER — Other Ambulatory Visit: Payer: Medicare Other

## 2016-07-13 ENCOUNTER — Other Ambulatory Visit (HOSPITAL_BASED_OUTPATIENT_CLINIC_OR_DEPARTMENT_OTHER): Payer: Medicare Other

## 2016-07-13 ENCOUNTER — Ambulatory Visit (HOSPITAL_BASED_OUTPATIENT_CLINIC_OR_DEPARTMENT_OTHER): Payer: Medicare Other

## 2016-07-13 ENCOUNTER — Ambulatory Visit (HOSPITAL_BASED_OUTPATIENT_CLINIC_OR_DEPARTMENT_OTHER): Payer: Medicare Other | Admitting: Hematology

## 2016-07-13 ENCOUNTER — Encounter: Payer: Self-pay | Admitting: Hematology

## 2016-07-13 VITALS — BP 148/71 | HR 81 | Temp 97.8°F | Resp 16 | Wt 127.5 lb

## 2016-07-13 DIAGNOSIS — C7951 Secondary malignant neoplasm of bone: Secondary | ICD-10-CM

## 2016-07-13 DIAGNOSIS — C659 Malignant neoplasm of unspecified renal pelvis: Secondary | ICD-10-CM

## 2016-07-13 DIAGNOSIS — I10 Essential (primary) hypertension: Secondary | ICD-10-CM | POA: Diagnosis not present

## 2016-07-13 DIAGNOSIS — C778 Secondary and unspecified malignant neoplasm of lymph nodes of multiple regions: Secondary | ICD-10-CM

## 2016-07-13 DIAGNOSIS — R05 Cough: Secondary | ICD-10-CM

## 2016-07-13 DIAGNOSIS — C787 Secondary malignant neoplasm of liver and intrahepatic bile duct: Secondary | ICD-10-CM | POA: Diagnosis not present

## 2016-07-13 DIAGNOSIS — K5120 Ulcerative (chronic) proctitis without complications: Secondary | ICD-10-CM | POA: Diagnosis not present

## 2016-07-13 DIAGNOSIS — C7800 Secondary malignant neoplasm of unspecified lung: Secondary | ICD-10-CM

## 2016-07-13 DIAGNOSIS — K529 Noninfective gastroenteritis and colitis, unspecified: Secondary | ICD-10-CM

## 2016-07-13 DIAGNOSIS — C651 Malignant neoplasm of right renal pelvis: Secondary | ICD-10-CM

## 2016-07-13 DIAGNOSIS — K625 Hemorrhage of anus and rectum: Secondary | ICD-10-CM

## 2016-07-13 DIAGNOSIS — Z5112 Encounter for antineoplastic immunotherapy: Secondary | ICD-10-CM

## 2016-07-13 DIAGNOSIS — Z95828 Presence of other vascular implants and grafts: Secondary | ICD-10-CM

## 2016-07-13 LAB — CBC & DIFF AND RETIC
BASO%: 0.5 % (ref 0.0–2.0)
BASOS ABS: 0 10*3/uL (ref 0.0–0.1)
EOS ABS: 0.1 10*3/uL (ref 0.0–0.5)
EOS%: 1.4 % (ref 0.0–7.0)
HEMATOCRIT: 31.9 % — AB (ref 34.8–46.6)
HEMOGLOBIN: 10.1 g/dL — AB (ref 11.6–15.9)
Immature Retic Fract: 9.6 % (ref 1.60–10.00)
LYMPH#: 1.5 10*3/uL (ref 0.9–3.3)
LYMPH%: 32.9 % (ref 14.0–49.7)
MCH: 28.9 pg (ref 25.1–34.0)
MCHC: 31.7 g/dL (ref 31.5–36.0)
MCV: 91.1 fL (ref 79.5–101.0)
MONO#: 0.8 10*3/uL (ref 0.1–0.9)
MONO%: 18.5 % — ABNORMAL HIGH (ref 0.0–14.0)
NEUT#: 2.1 10*3/uL (ref 1.5–6.5)
NEUT%: 46.7 % (ref 38.4–76.8)
Platelets: 247 10*3/uL (ref 145–400)
RBC: 3.5 10*6/uL — ABNORMAL LOW (ref 3.70–5.45)
RDW: 16.1 % — ABNORMAL HIGH (ref 11.2–14.5)
RETIC %: 1.8 % (ref 0.70–2.10)
RETIC CT ABS: 63 10*3/uL (ref 33.70–90.70)
WBC: 4.4 10*3/uL (ref 3.9–10.3)
nRBC: 0 % (ref 0–0)

## 2016-07-13 LAB — COMPREHENSIVE METABOLIC PANEL
ALBUMIN: 3.1 g/dL — AB (ref 3.5–5.0)
ALK PHOS: 37 U/L — AB (ref 40–150)
AST: 16 U/L (ref 5–34)
Anion Gap: 6 mEq/L (ref 3–11)
BUN: 15.4 mg/dL (ref 7.0–26.0)
CHLORIDE: 108 meq/L (ref 98–109)
CO2: 22 mEq/L (ref 22–29)
CREATININE: 1.1 mg/dL (ref 0.6–1.1)
Calcium: 8.7 mg/dL (ref 8.4–10.4)
EGFR: 46 mL/min/{1.73_m2} — ABNORMAL LOW (ref >90)
GLUCOSE: 155 mg/dL — AB (ref 70–140)
POTASSIUM: 3.9 meq/L (ref 3.5–5.1)
SODIUM: 137 meq/L (ref 136–145)
Total Bilirubin: 0.28 mg/dL (ref 0.20–1.20)
Total Protein: 7 g/dL (ref 6.4–8.3)

## 2016-07-13 MED ORDER — SODIUM CHLORIDE 0.9 % IJ SOLN
10.0000 mL | INTRAMUSCULAR | Status: DC | PRN
  Administered 2016-07-13: 10 mL
  Filled 2016-07-13: qty 10

## 2016-07-13 MED ORDER — SODIUM CHLORIDE 0.9 % IV SOLN
1200.0000 mg | Freq: Once | INTRAVENOUS | Status: AC
  Administered 2016-07-13: 1200 mg via INTRAVENOUS
  Filled 2016-07-13: qty 20

## 2016-07-13 MED ORDER — HEPARIN SOD (PORK) LOCK FLUSH 100 UNIT/ML IV SOLN
500.0000 [IU] | Freq: Once | INTRAVENOUS | Status: AC | PRN
  Administered 2016-07-13: 500 [IU]
  Filled 2016-07-13: qty 5

## 2016-07-13 MED ORDER — DENOSUMAB 120 MG/1.7ML ~~LOC~~ SOLN: 120.0000 mg | Freq: Once | SUBCUTANEOUS | Status: DC

## 2016-07-13 MED ORDER — BUDESONIDE 3 MG PO CPEP: 3.0000 mg | ORAL_CAPSULE | Freq: Every day | ORAL | 1 refills | Status: DC

## 2016-07-13 MED ORDER — SODIUM CHLORIDE 0.9 % IV SOLN
Freq: Once | INTRAVENOUS | Status: AC
  Administered 2016-07-13: 11:00:00 via INTRAVENOUS

## 2016-07-13 NOTE — Telephone Encounter (Signed)
Appointments schedule per 07/13/16 los. Patient was given a copy of the AVS report and appointment schedule, per 07/13/16 los.

## 2016-07-13 NOTE — Progress Notes (Signed)
Ok to proceed with treatment today without results of CMET per Dr. Irene Limbo.

## 2016-07-13 NOTE — Patient Instructions (Signed)
[facility] Discharge Instructions for Patients Receiving Chemotherapy  Today you received the following chemotherapy agents Tecentriq To help prevent nausea and vomiting after your treatment, we encourage you to take your nausea medication as prescribed.   If you develop nausea and vomiting that is not controlled by your nausea medication, call the clinic.   BELOW ARE SYMPTOMS THAT SHOULD BE REPORTED IMMEDIATELY:  *FEVER GREATER THAN 100.5 F  *CHILLS WITH OR WITHOUT FEVER  NAUSEA AND VOMITING THAT IS NOT CONTROLLED WITH YOUR NAUSEA MEDICATION  *UNUSUAL SHORTNESS OF BREATH  *UNUSUAL BRUISING OR BLEEDING  TENDERNESS IN MOUTH AND THROAT WITH OR WITHOUT PRESENCE OF ULCERS  *URINARY PROBLEMS  *BOWEL PROBLEMS  UNUSUAL RASH Items with * indicate a potential emergency and should be followed up as soon as possible.  Feel free to call the clinic you have any questions or concerns. The clinic phone number is [phone].  Please show the CHEMO ALERT CARD at check-in to the Emergency Department and triage nurse.   

## 2016-07-13 NOTE — Patient Instructions (Signed)
Thank you for choosing [facility] to provide your oncology and hematology care.  To afford each patient quality time with our providers, please arrive 30 minutes before your scheduled appointment time.  If you arrive late for your appointment, you may be asked to reschedule.  We strive to give you quality time with our providers, and arriving late affects you and other patients whose appointments are after yours.  If you are a no show for multiple scheduled visits, you may be dismissed from the clinic at the providers discretion.   Again, thank you for choosing [facility], our hope is that these requests will decrease the amount of time that you wait before being seen by our physicians.  ______________________________________________________________________ Should you have questions after your visit to the [facility], please contact our office at [phone] between the hours of 8:30 and 4:30 p.m.    Voicemails left after 4:30p.m will not be returned until the following business day.   For prescription refill requests, please have your pharmacy contact us directly.  Please also try to allow 48 hours for prescription requests.   Please contact the scheduling department for questions regarding scheduling.  For scheduling of procedures such as PET scans, CT scans, MRI, Ultrasound, etc please contact central scheduling at [phone].   Resources For Cancer Patients and Caregivers:  American Cancer Society:  800-227-2345  Can help patients locate various types of support and financial assistance Cancer Care: 1-800-813-HOPE (4673) Provides financial assistance, online support groups, medication/co-pay assistance.   [place] DSS:  336-641-3447 Where to apply for food stamps, Medicaid, and utility assistance Medicare Rights Center: 800-333-4114 Helps people with Medicare understand their rights and benefits, navigate the Medicare system, and secure the  quality healthcare they deserve SCAT: 336-333-6589 [place] [employer]'s shared-ride transportation service for eligible riders who have a disability that prevents them from riding the fixed route bus.   For additional information on assistance programs please contact our social worker:   Grier Hock/Abigail Elmore:  [phone] 

## 2016-07-13 NOTE — Progress Notes (Signed)
Hematology oncology clinic follow-up.  Date of service. 07/13/2016  Patient Care Team: [name], MD as PCP - General (Internal Medicine)   Urologist: Dr. 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  Diagnosis:  Widely metastatic transitional cell carcinoma from the right renal pelvis.  Treatment -Atezolizumab IV q3weeks status post 26 cycles here for her 27h cycle. Alphonse Guild for bone mets  HISTORY OF PRESENTING ILLNESS: Please see my previous notes for details of initial presentation.  INTERVAL HISTORY  Tiffany [name] is here for her scheduled follow-up prior to her next cycle of Atezolizumab. She notes a new concerns. She notes that her rectal symptoms have been stable with budesonide which she ran out of about a week ago. Recently had minimal rectal bleeding. Breathing has remained stable. Has some intermittent productive cough due to her bronchiectasis/hypersensitivity pneumonitis and is on intermittent antibiotics by Dr. Melvyn Novas. No abdominal pain. No new bone pains.   MEDICAL HISTORY:  Past Medical History:  Diagnosis Date  . Arthritis     knees 03-10-12 had Cortisone injection  . Cancer [facility]) june 2016   metastatic  . DDD (degenerative disc disease), cervical   . Edema leg    feet and ankles  . Esophagus disorder    Had esophagus stretched  . GERD (gastroesophageal reflux disease)    Benign stricture dilated in 2011  . Gout    Patient notes she has possible gout but has not been on chronic medications for this  . Headache(784.0)   . Hypertension   . Occipital neuralgia   . Occipital neuralgia    Related to cervical degenerative disc disease  . Peptic ulcer disease    Previous history in the remote past  . Ulcerative proctitis ([place])   . 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 ABDOMINAL HYSTERECTOMY    . APPENDECTOMY    . CHOLECYSTECTOMY    . COLONOSCOPY WITH PROPOFOL  03/25/2012   Procedure: COLONOSCOPY WITH PROPOFOL;  Surgeon: Garlan Fair, MD;  Location: [facility];  Service: Endoscopy;  Laterality: N/A;  . DILATION AND CURETTAGE OF UTERUS    . ESOPHAGEAL DILATION     For benign stricture in 2011  . ESOPHAGOGASTRODUODENOSCOPY    . FLEXIBLE SIGMOIDOSCOPY N/A 04/26/2014   Procedure: FLEXIBLE SIGMOIDOSCOPY - UnSedated;  Surgeon: Garlan Fair, MD;  Location: [facility];  Service: Endoscopy;  Laterality: N/A;  . FLEXIBLE SIGMOIDOSCOPY N/A 01/24/2015   Procedure: FLEXIBLE SIGNMOIDOSCOPY W/ FLEET ENEMIA;  Surgeon: Garlan Fair, MD;  Location: [facility];  Service: Endoscopy;  Laterality: N/A;  . FLEXIBLE SIGMOIDOSCOPY N/A 05/15/2016   Procedure: FLEXIBLE SIGMOIDOSCOPY;  Surgeon: Garlan Fair, MD;  Location: [facility];  Service: Endoscopy;  Laterality: N/A;  pt needs to ahve an enema upon arrival   . NECK SURGERY     20 years ago  . TOTAL ABDOMINAL HYSTERECTOMY W/ BILATERAL SALPINGOOPHORECTOMY     At age 27 due to miscarriage and retained products of conception causing significant uterine bleeding. Patient has been on estrogen replacement therapy since then.    SOCIAL HISTORY: Social History   Social History  . Marital status: Married    Spouse name: N/A  . Number of children: N/A  . Years of education: N/A   Occupational History  . Not on file.  Social History Main Topics  . Smoking status: Former Smoker    Quit date: 11/01/1983  . Smokeless tobacco: Never Used  . Alcohol use Yes     Comment: occ  . Drug use: No  . Sexual activity: No   Other Topics Concern  . Not on file   Social History Narrative  . No narrative on file  Retired as Conservation officer, nature for [employer]. She is very well spoken and intelligent individual and exhibits a keen knowledge of her medical  conditions.  FAMILY HISTORY: Family History  Problem Relation Age of Onset  . Lung cancer Sister   . Prostate cancer Brother   . Uterine cancer Maternal Aunt   . Breast cancer Paternal Grandmother   . Hodgkin's lymphoma Sister     ALLERGIES:  is allergic to nitrofurantoin; indomethacin; and lisinopril.  MEDICATIONS:  Current Outpatient Prescriptions  Medication Sig Dispense Refill  . acetaminophen (TYLENOL) 650 MG CR tablet Take 1,300 mg by mouth 2 (two) times daily.    Marland Kitchen amLODipine (NORVASC) 5 MG tablet Take 5 mg by mouth daily.    Marland Kitchen azithromycin (ZITHROMAX) 250 MG tablet Take 250-500 tablets by mouth daily. Take 500mg s on day 1 then take 250mg s daily for 4 more days as needed for cough  1  . budesonide (ENTOCORT EC) 3 MG 24 hr capsule Take 1 capsule (3 mg total) by mouth daily. followup with your GI doctor to optimize further management. 30 capsule 1  . COLCRYS 0.6 MG tablet Take 0.6 mg by mouth once daily as needed for gout  1  . estradiol (ESTRACE) 0.5 MG tablet Take 0.5 mg by mouth daily.  1  . guaiFENesin (MUCINEX) 600 MG 12 hr tablet Take 600 mg by mouth 2 (two) times daily.     Marland Kitchen Histamine Dihydrochloride (AUSTRALIAN DREAM ARTHRITIS EX) Apply 1 application topically 2 (two) times daily.    . mirtazapine (REMERON) 15 MG tablet TAKE ONE TABLET BY MOUTH AT BEDTIME 90 tablet 0  . PRESCRIPTION MEDICATION Chemo card    . Respiratory Therapy Supplies (FLUTTER) DEVI 1 each by Does not apply route daily. 1 each 0  . senna-docusate (SENNA S) 8.6-50 MG per tablet Take 2 tablets by mouth at bedtime. (Patient taking differently: Take 1-2 tablets by mouth daily as needed for mild constipation. ) 60 tablet 1  . triamcinolone ointment (KENALOG) 0.5 % Apply 1 application topically 2 (two) times daily. (Patient taking differently: Apply 1 application topically 2 (two) times daily as needed (rash). ) 30 g 0   No current facility-administered medications for this visit.    REVIEW OF SYSTEMS:     10 point review of systems done and is negative except as noted above  PHYSICAL EXAMINATION: ECOG PERFORMANCE STATUS: 1  Vitals:   07/13/16 0927  BP: (!) 148/71  Pulse: 81  Resp: 16  Temp: 97.8 F (36.6 C)   Filed Weights   07/13/16 0927  Weight: 127 lb 8 oz (57.8 kg)   GENERAL elderly Caucasian female, alert, no distress and comfortable SKIN: resolved skin rashes EYES: normal, conjunctiva are pink and non-injected, sclera clear OROPHARYNX:no exudate, no erythema and lips, buccal mucosa, and tongue normal  NECK: supple, thyroid normal size, non-tender, without nodularity LYMPH:  no palpable lymphadenopathy in the cervical, axillary or inguinal LUNGS: few bibasilar rales, scattered rhonci, good air entry. HEART: regular rate & rhythm and no murmurs and no lower extremity edema ABDOMEN:abdomen soft, non-tender and normal bowel sounds Musculoskeletal:no  cyanosis of digits and no clubbing  PSYCH: alert & oriented x 3 with fluent speech NEURO: no focal motor/sensory deficits  LABORATORY DATA:  CBC Latest Ref Rng & Units 07/13/2016 06/22/2016 06/01/2016  WBC 3.9 - 10.3 10e3/uL 4.4 5.4 5.2  Hemoglobin 11.6 - 15.9 g/dL 10.1(L) 9.9(L) 9.9(L)  Hematocrit 34.8 - 46.6 % 31.9(L) 31.2(L) 31.9(L)  Platelets 145 - 400 10e3/uL 247 290 287   . CMP Latest Ref Rng & Units 06/22/2016 06/01/2016 05/11/2016  Glucose 70 - 140 mg/dl 78 70 74  BUN 7.0 - 26.0 mg/dL 16.6 14.8 15.9  Creatinine 0.6 - 1.1 mg/dL 0.9 1.0 1.0  Sodium 136 - 145 mEq/L 139 139 139  Potassium 3.5 - 5.1 mEq/L 4.2 4.3 4.5  Chloride 101 - 111 mmol/L - - -  CO2 22 - 29 mEq/L 24 24 25   Calcium 8.4 - 10.4 mg/dL 9.3 9.0 9.3  Total Protein 6.4 - 8.3 g/dL 7.4 7.3 7.6  Total Bilirubin 0.20 - 1.20 mg/dL 0.37 0.43 0.44  Alkaline Phos 40 - 150 U/L 42 41 45  AST 5 - 34 U/L 14 18 17   ALT 0 - 55 U/L 8 7 6    . Lab Results  Component Value Date   LDH 228 05/11/2016      Radiology .Nm Pet Image Restag (ps) Skull Base To  Thigh  Result Date: 06/13/2016 CLINICAL DATA:  Subsequent treatment strategy for transitional cell carcinoma EXAM: NUCLEAR MEDICINE PET SKULL BASE TO THIGH TECHNIQUE: 6.33 mCi F-18 FDG was injected intravenously. Full-ring PET imaging was performed from the skull base to thigh after the radiotracer. CT data was obtained and used for attenuation correction and anatomic localization. FASTING BLOOD GLUCOSE:  Value: 85 mg/dl COMPARISON:  03/06/2016 FINDINGS: NECK No hypermetabolic lymph nodes in the neck. CHEST No hypermetabolic mediastinal or hilar nodes. Aortic atherosclerosis. Three vessel coronary artery calcification noted. Bilateral lower lobe bronchial wall thickening and bronchiectasis is identified and likely the sequelae of chronic inflammation/ infection. Nonspecific perifissural nodular density in the right middle lobe measures 6 mm and is too small to characterize. New from previous exam. No suspicious pulmonary nodules on the CT scan. ABDOMEN/PELVIS No abnormal hypermetabolic activity within the liver, pancreas, adrenal glands, or spleen. Increase in size of right kidney and dilated right renal collecting system. This currently measures 6 x 5.3 cm, image 110 of series 4. Previously 5.3 x 4.9 cm. Increase radiotracer uptake in this area is favored to represent a combination of tumor progression and superimposed excreted urine. No hypermetabolic lymph nodes in the abdomen or pelvis. SKELETON No focal hypermetabolic activity to suggest skeletal metastasis. IMPRESSION: 1. Interval increase in size of right kidney. Findings are suspicious for progression of residual/recurrent urothelial carcinoma with further dilatation of the right renal collecting system. 2. No evidence for hypermetabolic metastasis. 3. Chronic changes within both lungs likely the sequelae of recurrent inflammation/infection. 4. Aortic atherosclerosis and multi vessel coronary artery calcification. Electronically Signed   By: Kerby Moors  M.D.   On: 06/13/2016 10:20    ASSESSMENT & PLAN:   83 year old Caucasian female with  #1 Right Renal pelvis metastatic transitional cell carcinoma.   Initial PET scan revealed metastases to the lungs, liver, multiple nodal stations and bones.  PET scan done after 8 cycles of treatment for restaging disease showed significant response to treatment with no evidence of active disease. Patient has no clinical evidence of disease progression at this time. CT chest 06/16/2015 showed no evidence of cancer progression. PET/CT scan  done on 10/21/2015- showed no findings for metastatic disease. New right-sided hydronephrosis versus local tumor recurrence needs to be evaluated further. MRI of the kidney showed hyperenhancing the right renal pelvis lesion consistent with concern for local recurrence.  PET/CT 03/06/2016 with no findings of metastatic disease. Extensive right-sided hydronephrosis with high activity from excreted FDG obscuring any findings along the collecting system.  PET/CT  06/13/2016- Interval increase in size of right kidney. Findings are suspicious for progression of residual/recurrent urothelial carcinoma with further dilatation of the right renal collecting system. 2. No evidence for hypermetabolic metastases.   Plan -- Patient has no new treatment toxicities.  No clinical symptoms suggestive of overt disease recurrence/progression at this time. -We'll continue her Desmond Lope -Follow-up with Dr. Irene Limbo on 08/02/2016 for follow-up with labs prior to her next cycle of treatment. -Continue Xgeva every 6 weeks - will plan to space out or discontinue after completion of 2 years of treatment. -Patient has been previously seen by radiation oncology and has had input from them regarding the potential role of RT if her rt renal pelvis lesions were to become symptomatic. -Patient had some questions about coverage of her treatment and her payment responsivity's and was directed to  financial services regarding these questions.  #2 h/o skin rash likely related to her Atezolizumab. Minimal grade 1 and topical triamcinolone . Currently quiescent.  #3 Cough and some clear secretions likely due to bronchiectasis . Followed by Dr Melvyn Novas. . No chest pain no increased shortness of breath or dyspnea on exertion . Plan -Has an action plan with Dr. Melvyn Novas to use when necessary antibiotics for worsening symptoms.  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 Wynetta Emery.  But stopped using it due to cost issues. Was then on PO budesonide which she has stopped as well. Plan  --had recent sigmoidscopy with Dr Wynetta Emery - showed stable ulcerative proctitis  -will use po budesonide 3mg  po daily on as needed intermittent basis based on symptoms.  #4 history of hypertension   -on Amlodipine   -continue Atezolizumab as per schedule next dose 08/03/2016  -RTC with Dr Irene Limbo on 08/02/2016 with labs     Sullivan Lone MD [place] [facility]/Oncology Physician [facility]  (Office):       [phone] (Work cell):  [phone] (Fax):           [phone]

## 2016-07-17 ENCOUNTER — Encounter: Payer: Self-pay | Admitting: Hematology
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
## 2016-08-01 ENCOUNTER — Other Ambulatory Visit: Payer: Self-pay | Admitting: *Deleted

## 2016-08-01 DIAGNOSIS — C659 Malignant neoplasm of unspecified renal pelvis: Secondary | ICD-10-CM

## 2016-08-02 ENCOUNTER — Other Ambulatory Visit: Payer: Self-pay | Admitting: *Deleted

## 2016-08-02 ENCOUNTER — Encounter: Payer: Self-pay | Admitting: Hematology

## 2016-08-02 ENCOUNTER — Ambulatory Visit (HOSPITAL_BASED_OUTPATIENT_CLINIC_OR_DEPARTMENT_OTHER): Payer: Medicare Other | Admitting: Hematology

## 2016-08-02 ENCOUNTER — Other Ambulatory Visit (HOSPITAL_BASED_OUTPATIENT_CLINIC_OR_DEPARTMENT_OTHER): Payer: Medicare Other

## 2016-08-02 ENCOUNTER — Telehealth: Payer: Self-pay | Admitting: Hematology

## 2016-08-02 ENCOUNTER — Ambulatory Visit (HOSPITAL_BASED_OUTPATIENT_CLINIC_OR_DEPARTMENT_OTHER): Payer: Medicare Other

## 2016-08-02 VITALS — BP 126/64 | HR 76 | Temp 98.0°F | Resp 18 | Ht 61.0 in | Wt 125.7 lb

## 2016-08-02 DIAGNOSIS — C651 Malignant neoplasm of right renal pelvis: Secondary | ICD-10-CM

## 2016-08-02 DIAGNOSIS — C7800 Secondary malignant neoplasm of unspecified lung: Secondary | ICD-10-CM | POA: Diagnosis not present

## 2016-08-02 DIAGNOSIS — Z452 Encounter for adjustment and management of vascular access device: Secondary | ICD-10-CM | POA: Diagnosis not present

## 2016-08-02 DIAGNOSIS — I10 Essential (primary) hypertension: Secondary | ICD-10-CM

## 2016-08-02 DIAGNOSIS — C7951 Secondary malignant neoplasm of bone: Secondary | ICD-10-CM

## 2016-08-02 DIAGNOSIS — Z95828 Presence of other vascular implants and grafts: Secondary | ICD-10-CM

## 2016-08-02 DIAGNOSIS — C787 Secondary malignant neoplasm of liver and intrahepatic bile duct: Secondary | ICD-10-CM | POA: Diagnosis not present

## 2016-08-02 DIAGNOSIS — C659 Malignant neoplasm of unspecified renal pelvis: Secondary | ICD-10-CM

## 2016-08-02 LAB — CBC WITH DIFFERENTIAL/PLATELET
BASO%: 0.4 % (ref 0.0–2.0)
Basophils Absolute: 0 10*3/uL (ref 0.0–0.1)
EOS%: 1.4 % (ref 0.0–7.0)
Eosinophils Absolute: 0.1 10*3/uL (ref 0.0–0.5)
HCT: 30.6 % — ABNORMAL LOW (ref 34.8–46.6)
HEMOGLOBIN: 10 g/dL — AB (ref 11.6–15.9)
LYMPH%: 24.6 % (ref 14.0–49.7)
MCH: 29.4 pg (ref 25.1–34.0)
MCHC: 32.8 g/dL (ref 31.5–36.0)
MCV: 89.9 fL (ref 79.5–101.0)
MONO#: 1.4 10*3/uL — AB (ref 0.1–0.9)
MONO%: 22.2 % — AB (ref 0.0–14.0)
NEUT%: 51.4 % (ref 38.4–76.8)
NEUTROS ABS: 3.2 10*3/uL (ref 1.5–6.5)
Platelets: 353 10*3/uL (ref 145–400)
RBC: 3.4 10*6/uL — AB (ref 3.70–5.45)
RDW: 16.3 % — AB (ref 11.2–14.5)
WBC: 6.2 10*3/uL (ref 3.9–10.3)
lymph#: 1.5 10*3/uL (ref 0.9–3.3)

## 2016-08-02 LAB — COMPREHENSIVE METABOLIC PANEL
ALBUMIN: 3.2 g/dL — AB (ref 3.5–5.0)
ALT: 7 U/L (ref 0–55)
AST: 14 U/L (ref 5–34)
Alkaline Phosphatase: 38 U/L — ABNORMAL LOW (ref 40–150)
Anion Gap: 7 mEq/L (ref 3–11)
BILIRUBIN TOTAL: 0.37 mg/dL (ref 0.20–1.20)
BUN: 14 mg/dL (ref 7.0–26.0)
CO2: 22 meq/L (ref 22–29)
CREATININE: 0.9 mg/dL (ref 0.6–1.1)
Calcium: 9.2 mg/dL (ref 8.4–10.4)
Chloride: 108 mEq/L (ref 98–109)
EGFR: 58 mL/min/{1.73_m2} — ABNORMAL LOW (ref >90)
Glucose: 80 mg/dl (ref 70–140)
Potassium: 4.2 mEq/L (ref 3.5–5.1)
SODIUM: 138 meq/L (ref 136–145)
TOTAL PROTEIN: 7.2 g/dL (ref 6.4–8.3)

## 2016-08-02 MED ORDER — HEPARIN SOD (PORK) LOCK FLUSH 100 UNIT/ML IV SOLN
500.0000 [IU] | Freq: Once | INTRAVENOUS | Status: AC | PRN
  Administered 2016-08-02: 500 [IU] via INTRAVENOUS
  Filled 2016-08-02: qty 5

## 2016-08-02 MED ORDER — SODIUM CHLORIDE 0.9 % IJ SOLN
10.0000 mL | INTRAMUSCULAR | Status: DC | PRN
  Administered 2016-08-02: 10 mL via INTRAVENOUS
  Filled 2016-08-02: qty 10

## 2016-08-02 NOTE — Progress Notes (Signed)
Hematology oncology clinic follow-up.  Date of service. 08/02/2016  Patient Care Team: [name], MD as PCP - General (Internal Medicine)   Urologist: Dr. 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  Diagnosis:  Widely metastatic transitional cell carcinoma from the right renal pelvis.  Treatment -Atezolizumab IV q3weeks  Tiffany [name] for bone mets  HISTORY OF PRESENTING ILLNESS: Please see my previous notes for details of initial presentation.  INTERVAL HISTORY  Tiffany [name] is here for her scheduled follow-up prior to her next cycle of Atezolizumab. She notes no new concerns. She notes that she hasn't taken any of the oral budesonide and that has started to notice some blood in the stools and recently started taking it again in about 2-3 days ago.  Continues to have stable intermittent productive cough due to her bronchiectasis/hypersensitivity pneumonitis and is on intermittent antibiotics by Dr. Melvyn Novas. She was recommended to schedule a follow-up appointment with Dr. Melvyn Novas.  No abdominal pain. No new bone pains. No overt hematuria.  MEDICAL HISTORY:  Past Medical History:  Diagnosis Date  . Arthritis     knees 03-10-12 had Cortisone injection  . Cancer [facility]) june 2016   metastatic  . DDD (degenerative disc disease), cervical   . Edema leg    feet and ankles  . Esophagus disorder    Had esophagus stretched  . GERD (gastroesophageal reflux disease)    Benign stricture dilated in 2011  . Gout    Patient notes she has possible gout but has not been on chronic medications for this  . Headache(784.0)   . Hypertension   . Occipital neuralgia   . Occipital neuralgia    Related to cervical degenerative disc disease  . Peptic ulcer disease    Previous history in the remote past  . Ulcerative proctitis ([place])   . Ulcerative  proctitis ([place]) 2014    SURGICAL HISTORY: Past Surgical History:  Procedure Laterality Date  . ABDOMINAL HYSTERECTOMY    . APPENDECTOMY    . CHOLECYSTECTOMY    . COLONOSCOPY WITH PROPOFOL  03/25/2012   Procedure: COLONOSCOPY WITH PROPOFOL;  Surgeon: Garlan Fair, MD;  Location: [facility];  Service: Endoscopy;  Laterality: N/A;  . DILATION AND CURETTAGE OF UTERUS    . ESOPHAGEAL DILATION     For benign stricture in 2011  . ESOPHAGOGASTRODUODENOSCOPY    . FLEXIBLE SIGMOIDOSCOPY N/A 04/26/2014   Procedure: FLEXIBLE SIGMOIDOSCOPY - UnSedated;  Surgeon: Garlan Fair, MD;  Location: [facility];  Service: Endoscopy;  Laterality: N/A;  . FLEXIBLE SIGMOIDOSCOPY N/A 01/24/2015   Procedure: FLEXIBLE SIGNMOIDOSCOPY W/ FLEET ENEMIA;  Surgeon: Garlan Fair, MD;  Location: [facility];  Service: Endoscopy;  Laterality: N/A;  . FLEXIBLE SIGMOIDOSCOPY N/A 05/15/2016   Procedure: FLEXIBLE SIGMOIDOSCOPY;  Surgeon: Garlan Fair, MD;  Location: [facility];  Service: Endoscopy;  Laterality: N/A;  pt needs to ahve an enema upon arrival   . NECK SURGERY     20 years ago  . TOTAL ABDOMINAL HYSTERECTOMY W/ BILATERAL SALPINGOOPHORECTOMY     At age 42 due to miscarriage and retained products of conception causing significant uterine bleeding. Patient has been on estrogen replacement therapy since then.    SOCIAL HISTORY: Social History   Social History  . Marital status: Married    Spouse name: N/A  . Number of children: N/A  . Years of education:  N/A   Occupational History  . Not on file.   Social History Main Topics  . Smoking status: Former Smoker    Quit date: 11/01/1983  . Smokeless tobacco: Never Used  . Alcohol use Yes     Comment: occ  . Drug use: No  . Sexual activity: No   Other Topics Concern  . Not on file   Social History Narrative  . No narrative on file  Retired as Conservation officer, nature for [employer]. She is very well spoken and intelligent  individual and exhibits a keen knowledge of her medical conditions.  FAMILY HISTORY: Family History  Problem Relation Age of Onset  . Lung cancer Sister   . Prostate cancer Brother   . Uterine cancer Maternal Aunt   . Breast cancer Paternal Grandmother   . Hodgkin's lymphoma Sister     ALLERGIES:  is allergic to nitrofurantoin; indomethacin; and lisinopril.  MEDICATIONS:  Current Outpatient Prescriptions  Medication Sig Dispense Refill  . acetaminophen (TYLENOL) 650 MG CR tablet Take 1,300 mg by mouth 2 (two) times daily.    Marland Kitchen amLODipine (NORVASC) 5 MG tablet Take 5 mg by mouth daily.    Marland Kitchen azithromycin (ZITHROMAX) 250 MG tablet   5  . budesonide (ENTOCORT EC) 3 MG 24 hr capsule Take 1 capsule (3 mg total) by mouth daily. followup with your GI doctor to optimize further management. 30 capsule 1  . COLCRYS 0.6 MG tablet Take 0.6 mg by mouth once daily as needed for gout  1  . estradiol (ESTRACE) 0.5 MG tablet Take 0.5 mg by mouth daily.  1  . guaiFENesin (MUCINEX) 600 MG 12 hr tablet Take 600 mg by mouth 2 (two) times daily.     Marland Kitchen Histamine Dihydrochloride (AUSTRALIAN DREAM ARTHRITIS EX) Apply 1 application topically 2 (two) times daily.    . mirtazapine (REMERON) 15 MG tablet TAKE ONE TABLET BY MOUTH AT BEDTIME 90 tablet 0  . PRESCRIPTION MEDICATION Chemo card    . Respiratory Therapy Supplies (FLUTTER) DEVI 1 each by Does not apply route daily. 1 each 0  . senna-docusate (SENNA S) 8.6-50 MG per tablet Take 2 tablets by mouth at bedtime. (Patient taking differently: Take 1-2 tablets by mouth daily as needed for mild constipation. ) 60 tablet 1  . triamcinolone ointment (KENALOG) 0.5 % Apply 1 application topically 2 (two) times daily. (Patient taking differently: Apply 1 application topically 2 (two) times daily as needed (rash). ) 30 g 0   No current facility-administered medications for this visit.    REVIEW OF SYSTEMS:    10 point review of systems done and is negative except as  noted above  PHYSICAL EXAMINATION: ECOG PERFORMANCE STATUS: 1  Vitals:   08/02/16 1026  BP: 126/64  Pulse: 76  Resp: 18  Temp: 98 F (36.7 C)   Filed Weights   08/02/16 1026  Weight: 125 lb 11.2 oz (57 kg)   GENERAL elderly Caucasian female, alert, no distress and comfortable SKIN: resolved skin rashes EYES: normal, conjunctiva are pink and non-injected, sclera clear OROPHARYNX:no exudate, no erythema and lips, buccal mucosa, and tongue normal  NECK: supple, thyroid normal size, non-tender, without nodularity LYMPH:  no palpable lymphadenopathy in the cervical, axillary or inguinal LUNGS: few bibasilar rales, scattered rhonci, good air entry. HEART: regular rate & rhythm and no murmurs and no lower extremity edema ABDOMEN:abdomen soft, non-tender and normal bowel sounds Musculoskeletal:no cyanosis of digits and no clubbing  PSYCH: alert & oriented  x 3 with fluent speech NEURO: no focal motor/sensory deficits  LABORATORY DATA:  CBC Latest Ref Rng & Units 08/02/2016 07/13/2016 06/22/2016  WBC 3.9 - 10.3 10e3/uL 6.2 4.4 5.4  Hemoglobin 11.6 - 15.9 g/dL 10.0(L) 10.1(L) 9.9(L)  Hematocrit 34.8 - 46.6 % 30.6(L) 31.9(L) 31.2(L)  Platelets 145 - 400 10e3/uL 353 247 290   . CMP Latest Ref Rng & Units 08/02/2016 07/13/2016 06/22/2016  Glucose 70 - 140 mg/dl 80 155(H) 78  BUN 7.0 - 26.0 mg/dL 14.0 15.4 16.6  Creatinine 0.6 - 1.1 mg/dL 0.9 1.1 0.9  Sodium 136 - 145 mEq/L 138 137 139  Potassium 3.5 - 5.1 mEq/L 4.2 3.9 4.2  Chloride 101 - 111 mmol/L - - -  CO2 22 - 29 mEq/L 22 22 24   Calcium 8.4 - 10.4 mg/dL 9.2 8.7 9.3  Total Protein 6.4 - 8.3 g/dL 7.2 7.0 7.4  Total Bilirubin 0.20 - 1.20 mg/dL 0.37 0.28 0.37  Alkaline Phos 40 - 150 U/L 38(L) 37(L) 42  AST 5 - 34 U/L 14 16 14   ALT 0 - 55 U/L 7 <6 8   . Lab Results  Component Value Date   LDH 228 05/11/2016      Radiology .No results found.  ASSESSMENT & PLAN:   83 year old Caucasian female with  #1 Right Renal pelvis  metastatic transitional cell carcinoma.   Initial PET scan revealed metastases to the lungs, liver, multiple nodal stations and bones.  PET scan done after 8 cycles of treatment for restaging disease showed significant response to treatment with no evidence of active disease. Patient has no clinical evidence of disease progression at this time. CT chest 06/16/2015 showed no evidence of cancer progression. PET/CT scan done on 10/21/2015- showed no findings for metastatic disease. New right-sided hydronephrosis versus local tumor recurrence needs to be evaluated further. MRI of the kidney showed hyperenhancing the right renal pelvis lesion consistent with concern for local recurrence.  PET/CT 03/06/2016 with no findings of metastatic disease. Extensive right-sided hydronephrosis with high activity from excreted FDG obscuring any findings along the collecting system.  PET/CT  06/13/2016- Interval increase in size of right kidney. Findings are suspicious for progression of residual/recurrent urothelial carcinoma with further dilatation of the right renal collecting system. 2. No evidence for hypermetabolic metastases.   Plan -Patient has no new treatment toxicities.  No clinical symptoms suggestive of overt disease recurrence/progression at this time. -We'll continue her Desmond Lope. -Continue Xgeva every 6 weeks - will plan to space out or discontinue after completion of 2 years of treatment. -Will be due for repeat scans in mid/late June 2018  #2 h/o skin rash likely related to her Atezolizumab. Minimal grade 1 and topical triamcinolone . Currently quiescent.  #3 Cough and some clear secretions likely due to bronchiectasis . Followed by Dr Melvyn Novas. . No chest pain no increased shortness of breath or dyspnea on exertion . Plan -Has an action plan with Dr. Melvyn Novas to use when necessary antibiotics for worsening symptoms. -Recommended she reschedule her missed appointment with Dr. 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 Wynetta Emery.  But stopped using it due to cost issues. Was then on PO budesonide which she has stopped as well. had recent sigmoidscopy with Dr Wynetta Emery - showed stable ulcerative proctitis  Plan -will use po budesonide 3mg  po daily on as needed intermittent basis based  on symptoms.  #4 history of hypertension   -on Amlodipine   -continue Atezolizumab as per schedule next dose 08/03/2016  -RTC with Dr Irene Limbo in 3 weeks with labs with next dose of Atezolizumab   Sullivan Lone MD Tiffany Hematology/Oncology Physician [facility]  (Office):       [phone] (Work cell):  [phone] (Fax):           [phone]

## 2016-08-02 NOTE — Patient Instructions (Signed)
Implanted Port Home Guide An implanted port is a type of central line that is placed under the skin. Central lines are used to provide IV access when treatment or nutrition needs to be given through a person's veins. Implanted ports are used for long-term IV access. An implanted port may be placed because:  You need IV medicine that would be irritating to the small veins in your hands or arms.  You need long-term IV medicines, such as antibiotics.  You need IV nutrition for a long period.  You need frequent blood draws for lab tests.  You need dialysis.  Implanted ports are usually placed in the chest area, but they can also be placed in the upper arm, the abdomen, or the leg. An implanted port has two main parts:  Reservoir. The reservoir is round and will appear as a small, raised area under your skin. The reservoir is the part where a needle is inserted to give medicines or draw blood.  Catheter. The catheter is a thin, flexible tube that extends from the reservoir. The catheter is placed into a large vein. Medicine that is inserted into the reservoir goes into the catheter and then into the vein.  How will I care for my incision site? Do not get the incision site wet. Bathe or shower as directed by your health care provider. How is my port accessed? Special steps must be taken to access the port:  Before the port is accessed, a numbing cream can be placed on the skin. This helps numb the skin over the port site.  Your health care provider uses a sterile technique to access the port. ? Your health care provider must put on a mask and sterile gloves. ? The skin over your port is cleaned carefully with an antiseptic and allowed to dry. ? The port is gently pinched between sterile gloves, and a needle is inserted into the port.  Only "non-coring" port needles should be used to access the port. Once the port is accessed, a blood return should be checked. This helps ensure that the port  is in the vein and is not clogged.  If your port needs to remain accessed for a constant infusion, a clear (transparent) bandage will be placed over the needle site. The bandage and needle will need to be changed every week, or as directed by your health care provider.  Keep the bandage covering the needle clean and dry. Do not get it wet. Follow your health care provider's instructions on how to take a shower or bath while the port is accessed.  If your port does not need to stay accessed, no bandage is needed over the port.  What is flushing? Flushing helps keep the port from getting clogged. Follow your health care provider's instructions on how and when to flush the port. Ports are usually flushed with saline solution or a medicine called heparin. The need for flushing will depend on how the port is used.  If the port is used for intermittent medicines or blood draws, the port will need to be flushed: ? After medicines have been given. ? After blood has been drawn. ? As part of routine maintenance.  If a constant infusion is running, the port may not need to be flushed.  How long will my port stay implanted? The port can stay in for as long as your health care provider thinks it is needed. When it is time for the port to come out, surgery will be   done to remove it. The procedure is similar to the one performed when the port was put in. When should I seek immediate medical care? When you have an implanted port, you should seek immediate medical care if:  You notice a bad smell coming from the incision site.  You have swelling, redness, or drainage at the incision site.  You have more swelling or pain at the port site or the surrounding area.  You have a fever that is not controlled with medicine.  This information is not intended to replace advice given to you by your health care provider. Make sure you discuss any questions you have with your health care provider. Document  Released: 03/19/2005 Document Revised: 08/25/2015 Document Reviewed: 11/24/2012 Elsevier Interactive Patient Education  2017 Elsevier Inc.  

## 2016-08-02 NOTE — Telephone Encounter (Signed)
Unable to schedule appt per 5/3 LOS - Dr. Irene Limbo has no availability on RTC date - patient would like to have treatment and RTC on same day - message sent to Dr. Irene Limbo - patietn aware and will pick up a new updated schedule when she comes in tomorrow for treatment.

## 2016-08-02 NOTE — Patient Instructions (Signed)
Thank you for choosing [facility] to provide your oncology and hematology care.  To afford each patient quality time with our providers, please arrive 30 minutes before your scheduled appointment time.  If you arrive late for your appointment, you may be asked to reschedule.  We strive to give you quality time with our providers, and arriving late affects you and other patients whose appointments are after yours.  If you are a no show for multiple scheduled visits, you may be dismissed from the clinic at the providers discretion.   Again, thank you for choosing [facility], our hope is that these requests will decrease the amount of time that you wait before being seen by our physicians.  ______________________________________________________________________ Should you have questions after your visit to the [facility], please contact our office at [phone] between the hours of 8:30 and 4:30 p.m.    Voicemails left after 4:30p.m will not be returned until the following business day.   For prescription refill requests, please have your pharmacy contact us directly.  Please also try to allow 48 hours for prescription requests.   Please contact the scheduling department for questions regarding scheduling.  For scheduling of procedures such as PET scans, CT scans, MRI, Ultrasound, etc please contact central scheduling at [phone].   Resources For Cancer Patients and Caregivers:  American Cancer Society:  800-227-2345  Can help patients locate various types of support and financial assistance Cancer Care: 1-800-813-HOPE (4673) Provides financial assistance, online support groups, medication/co-pay assistance.   [place] DSS:  336-641-3447 Where to apply for food stamps, Medicaid, and utility assistance Medicare Rights Center: 800-333-4114 Helps people with Medicare understand their rights and benefits, navigate the Medicare system, and secure the  quality healthcare they deserve SCAT: 336-333-6589 [place] [employer]'s shared-ride transportation service for eligible riders who have a disability that prevents them from riding the fixed route bus.   For additional information on assistance programs please contact our social worker:   Grier Hock/Abigail Elmore:  [phone] 

## 2016-08-03 ENCOUNTER — Ambulatory Visit (HOSPITAL_BASED_OUTPATIENT_CLINIC_OR_DEPARTMENT_OTHER): Payer: Medicare Other

## 2016-08-03 ENCOUNTER — Telehealth: Payer: Self-pay | Admitting: Hematology

## 2016-08-03 VITALS — BP 123/65 | HR 98 | Temp 98.3°F | Resp 18

## 2016-08-03 DIAGNOSIS — Z95828 Presence of other vascular implants and grafts: Secondary | ICD-10-CM

## 2016-08-03 DIAGNOSIS — C659 Malignant neoplasm of unspecified renal pelvis: Secondary | ICD-10-CM

## 2016-08-03 DIAGNOSIS — Z5112 Encounter for antineoplastic immunotherapy: Secondary | ICD-10-CM | POA: Diagnosis not present

## 2016-08-03 DIAGNOSIS — C7951 Secondary malignant neoplasm of bone: Secondary | ICD-10-CM | POA: Diagnosis not present

## 2016-08-03 DIAGNOSIS — C651 Malignant neoplasm of right renal pelvis: Secondary | ICD-10-CM | POA: Diagnosis not present

## 2016-08-03 MED ORDER — SODIUM CHLORIDE 0.9 % IJ SOLN
10.0000 mL | INTRAMUSCULAR | Status: DC | PRN
  Administered 2016-08-03: 10 mL
  Filled 2016-08-03: qty 10

## 2016-08-03 MED ORDER — HEPARIN SOD (PORK) LOCK FLUSH 100 UNIT/ML IV SOLN
500.0000 [IU] | Freq: Once | INTRAVENOUS | Status: AC | PRN
  Administered 2016-08-03: 500 [IU]
  Filled 2016-08-03: qty 5

## 2016-08-03 MED ORDER — SODIUM CHLORIDE 0.9 % IV SOLN
1200.0000 mg | Freq: Once | INTRAVENOUS | Status: AC
  Administered 2016-08-03: 1200 mg via INTRAVENOUS
  Filled 2016-08-03: qty 20

## 2016-08-03 MED ORDER — SODIUM CHLORIDE 0.9 % IV SOLN
Freq: Once | INTRAVENOUS | Status: AC
  Administered 2016-08-03: 12:00:00 via INTRAVENOUS

## 2016-08-03 MED ORDER — DENOSUMAB 120 MG/1.7ML ~~LOC~~ SOLN
120.0000 mg | Freq: Once | SUBCUTANEOUS | Status: AC
  Administered 2016-08-03: 120 mg via SUBCUTANEOUS
  Filled 2016-08-03: qty 1.7

## 2016-08-03 NOTE — Telephone Encounter (Signed)
Scheduled appt per 5/3 los. - Patient to pick up new schedule today in treatment area.

## 2016-08-03 NOTE — Patient Instructions (Signed)
[facility] Discharge Instructions for Patients Receiving Chemotherapy  Today you received the following chemotherapy agents Tecentriq To help prevent nausea and vomiting after your treatment, we encourage you to take your nausea medication as prescribed.   If you develop nausea and vomiting that is not controlled by your nausea medication, call the clinic.   BELOW ARE SYMPTOMS THAT SHOULD BE REPORTED IMMEDIATELY:  *FEVER GREATER THAN 100.5 F  *CHILLS WITH OR WITHOUT FEVER  NAUSEA AND VOMITING THAT IS NOT CONTROLLED WITH YOUR NAUSEA MEDICATION  *UNUSUAL SHORTNESS OF BREATH  *UNUSUAL BRUISING OR BLEEDING  TENDERNESS IN MOUTH AND THROAT WITH OR WITHOUT PRESENCE OF ULCERS  *URINARY PROBLEMS  *BOWEL PROBLEMS  UNUSUAL RASH Items with * indicate a potential emergency and should be followed up as soon as possible.  Feel free to call the clinic you have any questions or concerns. The clinic phone number is [phone].  Please show the CHEMO ALERT CARD at check-in to the Emergency Department and triage nurse.   

## 2016-08-23 ENCOUNTER — Encounter: Payer: Self-pay | Admitting: Hematology

## 2016-08-23 ENCOUNTER — Ambulatory Visit (HOSPITAL_BASED_OUTPATIENT_CLINIC_OR_DEPARTMENT_OTHER): Payer: Medicare Other

## 2016-08-23 ENCOUNTER — Other Ambulatory Visit (HOSPITAL_BASED_OUTPATIENT_CLINIC_OR_DEPARTMENT_OTHER): Payer: Medicare Other

## 2016-08-23 ENCOUNTER — Ambulatory Visit (HOSPITAL_BASED_OUTPATIENT_CLINIC_OR_DEPARTMENT_OTHER): Payer: Medicare Other | Admitting: Hematology

## 2016-08-23 ENCOUNTER — Telehealth: Payer: Self-pay | Admitting: Hematology

## 2016-08-23 VITALS — BP 150/70 | HR 76 | Temp 98.3°F | Resp 18 | Ht 61.0 in | Wt 124.2 lb

## 2016-08-23 DIAGNOSIS — C7800 Secondary malignant neoplasm of unspecified lung: Secondary | ICD-10-CM

## 2016-08-23 DIAGNOSIS — C651 Malignant neoplasm of right renal pelvis: Secondary | ICD-10-CM | POA: Diagnosis not present

## 2016-08-23 DIAGNOSIS — I10 Essential (primary) hypertension: Secondary | ICD-10-CM

## 2016-08-23 DIAGNOSIS — R05 Cough: Secondary | ICD-10-CM | POA: Diagnosis not present

## 2016-08-23 DIAGNOSIS — C659 Malignant neoplasm of unspecified renal pelvis: Secondary | ICD-10-CM

## 2016-08-23 DIAGNOSIS — K625 Hemorrhage of anus and rectum: Secondary | ICD-10-CM

## 2016-08-23 DIAGNOSIS — C7951 Secondary malignant neoplasm of bone: Secondary | ICD-10-CM

## 2016-08-23 DIAGNOSIS — K529 Noninfective gastroenteritis and colitis, unspecified: Secondary | ICD-10-CM

## 2016-08-23 DIAGNOSIS — C787 Secondary malignant neoplasm of liver and intrahepatic bile duct: Secondary | ICD-10-CM

## 2016-08-23 DIAGNOSIS — Z5112 Encounter for antineoplastic immunotherapy: Secondary | ICD-10-CM | POA: Diagnosis not present

## 2016-08-23 LAB — CBC & DIFF AND RETIC
BASO%: 0.2 % (ref 0.0–2.0)
BASOS ABS: 0 10*3/uL (ref 0.0–0.1)
EOS%: 2 % (ref 0.0–7.0)
Eosinophils Absolute: 0.1 10*3/uL (ref 0.0–0.5)
HCT: 32.9 % — ABNORMAL LOW (ref 34.8–46.6)
HGB: 10.2 g/dL — ABNORMAL LOW (ref 11.6–15.9)
IMMATURE RETIC FRACT: 10.4 % — AB (ref 1.60–10.00)
LYMPH#: 2.1 10*3/uL (ref 0.9–3.3)
LYMPH%: 39.1 % (ref 14.0–49.7)
MCH: 29 pg (ref 25.1–34.0)
MCHC: 31 g/dL — ABNORMAL LOW (ref 31.5–36.0)
MCV: 93.5 fL (ref 79.5–101.0)
MONO#: 1 10*3/uL — AB (ref 0.1–0.9)
MONO%: 17.4 % — ABNORMAL HIGH (ref 0.0–14.0)
NEUT#: 2.3 10*3/uL (ref 1.5–6.5)
NEUT%: 41.3 % (ref 38.4–76.8)
PLATELETS: 266 10*3/uL (ref 145–400)
RBC: 3.52 10*6/uL — AB (ref 3.70–5.45)
RDW: 16.4 % — ABNORMAL HIGH (ref 11.2–14.5)
RETIC %: 1.79 % (ref 0.70–2.10)
RETIC CT ABS: 63.01 10*3/uL (ref 33.70–90.70)
WBC: 5.5 10*3/uL (ref 3.9–10.3)

## 2016-08-23 LAB — COMPREHENSIVE METABOLIC PANEL
ALT: 8 U/L (ref 0–55)
ANION GAP: 6 meq/L (ref 3–11)
AST: 17 U/L (ref 5–34)
Albumin: 3.4 g/dL — ABNORMAL LOW (ref 3.5–5.0)
Alkaline Phosphatase: 42 U/L (ref 40–150)
BILIRUBIN TOTAL: 0.44 mg/dL (ref 0.20–1.20)
BUN: 17.7 mg/dL (ref 7.0–26.0)
CALCIUM: 9.2 mg/dL (ref 8.4–10.4)
CO2: 25 meq/L (ref 22–29)
CREATININE: 1 mg/dL (ref 0.6–1.1)
Chloride: 108 mEq/L (ref 98–109)
EGFR: 47 mL/min/{1.73_m2} — ABNORMAL LOW (ref >90)
Glucose: 86 mg/dl (ref 70–140)
Potassium: 4.6 mEq/L (ref 3.5–5.1)
Sodium: 138 mEq/L (ref 136–145)
TOTAL PROTEIN: 7.7 g/dL (ref 6.4–8.3)

## 2016-08-23 MED ORDER — SODIUM CHLORIDE 0.9 % IV SOLN
1200.0000 mg | Freq: Once | INTRAVENOUS | Status: AC
  Administered 2016-08-23: 1200 mg via INTRAVENOUS
  Filled 2016-08-23: qty 20

## 2016-08-23 MED ORDER — HEPARIN SOD (PORK) LOCK FLUSH 100 UNIT/ML IV SOLN
500.0000 [IU] | Freq: Once | INTRAVENOUS | Status: AC | PRN
  Administered 2016-08-23: 500 [IU]
  Filled 2016-08-23: qty 5

## 2016-08-23 MED ORDER — SODIUM CHLORIDE 0.9 % IV SOLN
Freq: Once | INTRAVENOUS | Status: AC
  Administered 2016-08-23: 15:00:00 via INTRAVENOUS

## 2016-08-23 MED ORDER — SODIUM CHLORIDE 0.9 % IJ SOLN
10.0000 mL | INTRAMUSCULAR | Status: DC | PRN
Start: 2016-08-23 — End: 2016-08-23
  Administered 2016-08-23: 10 mL
  Filled 2016-08-23: qty 10

## 2016-08-23 NOTE — Telephone Encounter (Signed)
Gave patient AVS and calender per 5/24 los  

## 2016-08-23 NOTE — Progress Notes (Signed)
Received staff message regarding patient with financial concerns for treatment.  Called patient to discuss. Attempted to explain to patient what was showing in her self-pay balance and that this was the remainder after her insurance paid. Patient was thinking that her treatment was denied. Advised patient billing should she have any additional financial questions or concerns.

## 2016-08-23 NOTE — Patient Instructions (Signed)
[facility] Discharge Instructions for Patients Receiving Chemotherapy  Today you received the following chemotherapy agents Atezolizumab To help prevent nausea and vomiting after your treatment, we encourage you to take your nausea medication   If you develop nausea and vomiting that is not controlled by your nausea medication, call the clinic.   BELOW ARE SYMPTOMS THAT SHOULD BE REPORTED IMMEDIATELY:  *FEVER GREATER THAN 100.5 F  *CHILLS WITH OR WITHOUT FEVER  NAUSEA AND VOMITING THAT IS NOT CONTROLLED WITH YOUR NAUSEA MEDICATION  *UNUSUAL SHORTNESS OF BREATH  *UNUSUAL BRUISING OR BLEEDING  TENDERNESS IN MOUTH AND THROAT WITH OR WITHOUT PRESENCE OF ULCERS  *URINARY PROBLEMS  *BOWEL PROBLEMS  UNUSUAL RASH Items with * indicate a potential emergency and should be followed up as soon as possible.  Feel free to call the clinic you have any questions or concerns. The clinic phone number is [phone].  Please show the [place] at check-in to the Emergency Department and triage nurse.

## 2016-08-29 NOTE — Progress Notes (Signed)
Hematology oncology clinic follow-up.  Date of service. 08/23/2016  Patient Care Team: [name], MD as PCP - General (Internal Medicine)   Urologist: Dr. 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  Diagnosis:  Widely metastatic transitional cell carcinoma from the right renal pelvis.  Treatment -Atezolizumab IV q3weeks  Alphonse Guild for bone mets  HISTORY OF PRESENTING ILLNESS: Please see my previous notes for details of initial presentation.  INTERVAL HISTORY  Tiffany [name] is here for her scheduled follow-up prior to her next cycle of Atezolizumab. She notes no new concerns. She notes that she taking her oral budesonide as needed and are inflammatory proctitis is under control with no significant rectal bleeding or diarrhea or rectal urgency. Chronic cough which is unchanged. No new shortness of breath. No new skin rashes. No abdominal pain. No new bone pains. No overt hematuria.  MEDICAL HISTORY:  Past Medical History:  Diagnosis Date  . Arthritis     knees 03-10-12 had Cortisone injection  . Cancer [facility]) june 2016   metastatic  . DDD (degenerative disc disease), cervical   . Edema leg    feet and ankles  . Esophagus disorder    Had esophagus stretched  . GERD (gastroesophageal reflux disease)    Benign stricture dilated in 2011  . Gout    Patient notes she has possible gout but has not been on chronic medications for this  . Headache(784.0)   . Hypertension   . Occipital neuralgia   . Occipital neuralgia    Related to cervical degenerative disc disease  . Peptic ulcer disease    Previous history in the remote past  . Ulcerative proctitis ([place])   . Ulcerative proctitis ([place]) 2014    SURGICAL HISTORY: Past Surgical History:  Procedure Laterality Date  . ABDOMINAL HYSTERECTOMY    . APPENDECTOMY    .  CHOLECYSTECTOMY    . COLONOSCOPY WITH PROPOFOL  03/25/2012   Procedure: COLONOSCOPY WITH PROPOFOL;  Surgeon: Garlan Fair, MD;  Location: [facility]COPY;  Service: Endoscopy;  Laterality: N/A;  . DILATION AND CURETTAGE OF UTERUS    . ESOPHAGEAL DILATION     For benign stricture in 2011  . ESOPHAGOGASTRODUODENOSCOPY    . FLEXIBLE SIGMOIDOSCOPY N/A 04/26/2014   Procedure: FLEXIBLE SIGMOIDOSCOPY - UnSedated;  Surgeon: Garlan Fair, MD;  Location: [facility];  Service: Endoscopy;  Laterality: N/A;  . FLEXIBLE SIGMOIDOSCOPY N/A 01/24/2015   Procedure: FLEXIBLE SIGNMOIDOSCOPY W/ FLEET ENEMIA;  Surgeon: Garlan Fair, MD;  Location: [facility];  Service: Endoscopy;  Laterality: N/A;  . FLEXIBLE SIGMOIDOSCOPY N/A 05/15/2016   Procedure: FLEXIBLE SIGMOIDOSCOPY;  Surgeon: Garlan Fair, MD;  Location: [facility];  Service: Endoscopy;  Laterality: N/A;  pt needs to ahve an enema upon arrival   . NECK SURGERY     20 years ago  . TOTAL ABDOMINAL HYSTERECTOMY W/ BILATERAL SALPINGOOPHORECTOMY     At age 28 due to miscarriage and retained products of conception causing significant uterine bleeding. Patient has been on estrogen replacement therapy since then.    SOCIAL HISTORY: Social History   Social History  . Marital status: Married    Spouse name: N/A  . Number of children: N/A  . Years of education: N/A   Occupational History  . Not on file.   Social History Main Topics  . Smoking status: Former Smoker  Quit date: 11/01/1983  . Smokeless tobacco: Never Used  . Alcohol use Yes     Comment: occ  . Drug use: No  . Sexual activity: No   Other Topics Concern  . Not on file   Social History Narrative  . No narrative on file  Retired as Conservation officer, nature for [employer]. She is very well spoken and intelligent individual and exhibits a keen knowledge of her medical conditions.  FAMILY HISTORY: Family History  Problem Relation Age of Onset  . Lung cancer  Sister   . Prostate cancer Brother   . Uterine cancer Maternal Aunt   . Breast cancer Paternal Grandmother   . Hodgkin's lymphoma Sister     ALLERGIES:  is allergic to nitrofurantoin; indomethacin; and lisinopril.  MEDICATIONS:  Current Outpatient Prescriptions  Medication Sig Dispense Refill  . acetaminophen (TYLENOL) 650 MG CR tablet Take 1,300 mg by mouth 2 (two) times daily.    Marland Kitchen amLODipine (NORVASC) 5 MG tablet Take 5 mg by mouth daily.    Marland Kitchen azithromycin (ZITHROMAX) 250 MG tablet   5  . budesonide (ENTOCORT EC) 3 MG 24 hr capsule Take 1 capsule (3 mg total) by mouth daily. followup with your GI doctor to optimize further management. 30 capsule 1  . COLCRYS 0.6 MG tablet Take 0.6 mg by mouth once daily as needed for gout  1  . estradiol (ESTRACE) 0.5 MG tablet Take 0.5 mg by mouth daily.  1  . guaiFENesin (MUCINEX) 600 MG 12 hr tablet Take 600 mg by mouth 2 (two) times daily.     Marland Kitchen Histamine Dihydrochloride (AUSTRALIAN DREAM ARTHRITIS EX) Apply 1 application topically 2 (two) times daily.    . mirtazapine (REMERON) 15 MG tablet TAKE ONE TABLET BY MOUTH AT BEDTIME 90 tablet 0  . PRESCRIPTION MEDICATION Chemo card    . Respiratory Therapy Supplies (FLUTTER) DEVI 1 each by Does not apply route daily. 1 each 0  . senna-docusate (SENNA S) 8.6-50 MG per tablet Take 2 tablets by mouth at bedtime. (Patient taking differently: Take 1-2 tablets by mouth daily as needed for mild constipation. ) 60 tablet 1  . triamcinolone ointment (KENALOG) 0.5 % Apply 1 application topically 2 (two) times daily. (Patient taking differently: Apply 1 application topically 2 (two) times daily as needed (rash). ) 30 g 0   No current facility-administered medications for this visit.    REVIEW OF SYSTEMS:    10 point review of systems done and is negative except as noted above  PHYSICAL EXAMINATION: ECOG PERFORMANCE STATUS: 1  Vitals:   08/23/16 1151  BP: (!) 150/70  Pulse: 76  Resp: 18  Temp: 98.3 F  (36.8 C)   Filed Weights   08/23/16 1151  Weight: 124 lb 3.2 oz (56.3 kg)   GENERAL elderly Caucasian female, alert, no distress and comfortable SKIN: resolved skin rashes EYES: normal, conjunctiva are pink and non-injected, sclera clear OROPHARYNX:no exudate, no erythema and lips, buccal mucosa, and tongue normal  NECK: supple, thyroid normal size, non-tender, without nodularity LYMPH:  no palpable lymphadenopathy in the cervical, axillary or inguinal LUNGS: few bibasilar rales, scattered rhonci, good air entry. HEART: regular rate & rhythm and no murmurs and no lower extremity edema ABDOMEN:abdomen soft, non-tender and normal bowel sounds Musculoskeletal:no cyanosis of digits and no clubbing  PSYCH: alert & oriented x 3 with fluent speech NEURO: no focal motor/sensory deficits  LABORATORY DATA:  CBC Latest Ref Rng & Units 08/23/2016 08/02/2016 07/13/2016  WBC 3.9 - 10.3 10e3/uL 5.5 6.2 4.4  Hemoglobin 11.6 - 15.9 g/dL 10.2(L) 10.0(L) 10.1(L)  Hematocrit 34.8 - 46.6 % 32.9(L) 30.6(L) 31.9(L)  Platelets 145 - 400 10e3/uL 266 353 247   . CMP Latest Ref Rng & Units 08/23/2016 08/02/2016 07/13/2016  Glucose 70 - 140 mg/dl 86 80 155(H)  BUN 7.0 - 26.0 mg/dL 17.7 14.0 15.4  Creatinine 0.6 - 1.1 mg/dL 1.0 0.9 1.1  Sodium 136 - 145 mEq/L 138 138 137  Potassium 3.5 - 5.1 mEq/L 4.6 4.2 3.9  Chloride 101 - 111 mmol/L - - -  CO2 22 - 29 mEq/L 25 22 22   Calcium 8.4 - 10.4 mg/dL 9.2 9.2 8.7  Total Protein 6.4 - 8.3 g/dL 7.7 7.2 7.0  Total Bilirubin 0.20 - 1.20 mg/dL 0.44 0.37 0.28  Alkaline Phos 40 - 150 U/L 42 38(L) 37(L)  AST 5 - 34 U/L 17 14 16   ALT 0 - 55 U/L 8 7 <6    Radiology .No results found.  ASSESSMENT & PLAN:   83 year old Caucasian female with  #1 Right Renal pelvis metastatic transitional cell carcinoma.   Initial PET scan revealed metastases to the lungs, liver, multiple nodal stations and bones.  PET scan done after 8 cycles of treatment for restaging disease showed  significant response to treatment with no evidence of active disease. Patient has no clinical evidence of disease progression at this time. CT chest 06/16/2015 showed no evidence of cancer progression. PET/CT scan done on 10/21/2015- showed no findings for metastatic disease. New right-sided hydronephrosis versus local tumor recurrence needs to be evaluated further. MRI of the kidney showed hyperenhancing the right renal pelvis lesion consistent with concern for local recurrence.  PET/CT 03/06/2016 with no findings of metastatic disease. Extensive right-sided hydronephrosis with high activity from excreted FDG obscuring any findings along the collecting system.  PET/CT  06/13/2016- Interval increase in size of right kidney. Findings are suspicious for progression of residual/recurrent urothelial carcinoma with further dilatation of the right renal collecting system. 2. No evidence for hypermetabolic metastases.   Plan -Patient has no new treatment toxicities.  No clinical symptoms suggestive of overt disease recurrence/progression at this time. -We'll continue her Desmond Lope. -Continue Xgeva every 6 weeks - will plan to space out or discontinue after completion of 2 years of treatment. -Will be due for repeat scans in mid/late June 2018  #2 h/o skin rash likely related to her Atezolizumab. Minimal grade 1 and topical triamcinolone . Currently quiescent.  #3 Cough and some clear secretions likely due to bronchiectasis . Followed by Dr Melvyn Novas. . No chest pain no increased shortness of breath or dyspnea on exertion . Plan -Has an action plan with Dr. Melvyn Novas to use when necessary antibiotics for worsening symptoms. -Continue follow-up with Dr. 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 Wynetta Emery.  But stopped using it due to cost issues. Was then on PO  budesonide which she has stopped as well. had recent sigmoidscopy with Dr Wynetta Emery - showed stable ulcerative proctitis  Plan -will use po budesonide 3mg  po daily on as needed intermittent basis based on symptoms.  #4 history of hypertension   -on Amlodipine   -continue Atezolizumab as per schedule -RTC with Dr Irene Limbo in 3 weeks with labs with next dose of Atezolizumab   Sullivan Lone MD [facility] Hematology/Oncology Physician [facility]  (  Office):       [phone] (Work cell):  [phone] (Fax):           [phone]

## 2016-09-03 DIAGNOSIS — I10 Essential (primary) hypertension: Secondary | ICD-10-CM | POA: Diagnosis not present

## 2016-09-03 DIAGNOSIS — C689 Malignant neoplasm of urinary organ, unspecified: Secondary | ICD-10-CM | POA: Diagnosis not present

## 2016-09-03 DIAGNOSIS — M8588 Other specified disorders of bone density and structure, other site: Secondary | ICD-10-CM | POA: Diagnosis not present

## 2016-09-03 DIAGNOSIS — J479 Bronchiectasis, uncomplicated: Secondary | ICD-10-CM | POA: Diagnosis not present

## 2016-09-13 ENCOUNTER — Ambulatory Visit (HOSPITAL_BASED_OUTPATIENT_CLINIC_OR_DEPARTMENT_OTHER): Payer: Medicare Other | Admitting: Hematology

## 2016-09-13 ENCOUNTER — Telehealth: Payer: Self-pay | Admitting: Hematology

## 2016-09-13 ENCOUNTER — Other Ambulatory Visit: Payer: Self-pay

## 2016-09-13 ENCOUNTER — Other Ambulatory Visit (HOSPITAL_BASED_OUTPATIENT_CLINIC_OR_DEPARTMENT_OTHER): Payer: Medicare Other

## 2016-09-13 ENCOUNTER — Ambulatory Visit (HOSPITAL_BASED_OUTPATIENT_CLINIC_OR_DEPARTMENT_OTHER): Payer: Medicare Other

## 2016-09-13 ENCOUNTER — Ambulatory Visit: Payer: Medicare Other

## 2016-09-13 VITALS — BP 139/58 | HR 71 | Temp 97.7°F | Resp 20 | Ht 61.0 in | Wt 123.8 lb

## 2016-09-13 DIAGNOSIS — N1330 Unspecified hydronephrosis: Secondary | ICD-10-CM

## 2016-09-13 DIAGNOSIS — C787 Secondary malignant neoplasm of liver and intrahepatic bile duct: Secondary | ICD-10-CM | POA: Diagnosis not present

## 2016-09-13 DIAGNOSIS — C7951 Secondary malignant neoplasm of bone: Secondary | ICD-10-CM

## 2016-09-13 DIAGNOSIS — C7800 Secondary malignant neoplasm of unspecified lung: Secondary | ICD-10-CM | POA: Diagnosis not present

## 2016-09-13 DIAGNOSIS — Z5112 Encounter for antineoplastic immunotherapy: Secondary | ICD-10-CM | POA: Diagnosis not present

## 2016-09-13 DIAGNOSIS — C778 Secondary and unspecified malignant neoplasm of lymph nodes of multiple regions: Secondary | ICD-10-CM | POA: Diagnosis not present

## 2016-09-13 DIAGNOSIS — I10 Essential (primary) hypertension: Secondary | ICD-10-CM | POA: Diagnosis not present

## 2016-09-13 DIAGNOSIS — C651 Malignant neoplasm of right renal pelvis: Secondary | ICD-10-CM

## 2016-09-13 DIAGNOSIS — Z95828 Presence of other vascular implants and grafts: Secondary | ICD-10-CM

## 2016-09-13 DIAGNOSIS — K5120 Ulcerative (chronic) proctitis without complications: Secondary | ICD-10-CM

## 2016-09-13 DIAGNOSIS — C659 Malignant neoplasm of unspecified renal pelvis: Secondary | ICD-10-CM

## 2016-09-13 LAB — CBC & DIFF AND RETIC
BASO%: 0.4 % (ref 0.0–2.0)
Basophils Absolute: 0 10*3/uL (ref 0.0–0.1)
EOS ABS: 0.1 10*3/uL (ref 0.0–0.5)
EOS%: 1.7 % (ref 0.0–7.0)
HEMATOCRIT: 31.2 % — AB (ref 34.8–46.6)
HEMOGLOBIN: 9.6 g/dL — AB (ref 11.6–15.9)
Immature Retic Fract: 14.2 % — ABNORMAL HIGH (ref 1.60–10.00)
LYMPH%: 43.1 % (ref 14.0–49.7)
MCH: 28.5 pg (ref 25.1–34.0)
MCHC: 30.8 g/dL — ABNORMAL LOW (ref 31.5–36.0)
MCV: 92.6 fL (ref 79.5–101.0)
MONO#: 0.7 10*3/uL (ref 0.1–0.9)
MONO%: 14.7 % — AB (ref 0.0–14.0)
NEUT#: 1.9 10*3/uL (ref 1.5–6.5)
NEUT%: 40.1 % (ref 38.4–76.8)
Platelets: 327 10*3/uL (ref 145–400)
RBC: 3.37 10*6/uL — ABNORMAL LOW (ref 3.70–5.45)
RDW: 16.3 % — ABNORMAL HIGH (ref 11.2–14.5)
Retic %: 1.95 % (ref 0.70–2.10)
Retic Ct Abs: 65.72 10*3/uL (ref 33.70–90.70)
WBC: 4.7 10*3/uL (ref 3.9–10.3)
lymph#: 2 10*3/uL (ref 0.9–3.3)

## 2016-09-13 LAB — COMPREHENSIVE METABOLIC PANEL
ALBUMIN: 3.3 g/dL — AB (ref 3.5–5.0)
ALK PHOS: 38 U/L — AB (ref 40–150)
ALT: <6 U/L (ref 0–55)
ANION GAP: 10 meq/L (ref 3–11)
AST: 16 U/L (ref 5–34)
BILIRUBIN TOTAL: 0.45 mg/dL (ref 0.20–1.20)
BUN: 15.5 mg/dL (ref 7.0–26.0)
CALCIUM: 9.4 mg/dL (ref 8.4–10.4)
CO2: 24 mEq/L (ref 22–29)
Chloride: 107 mEq/L (ref 98–109)
Creatinine: 1 mg/dL (ref 0.6–1.1)
EGFR: 51 mL/min/{1.73_m2} — AB (ref >90)
Glucose: 81 mg/dl (ref 70–140)
POTASSIUM: 4.4 meq/L (ref 3.5–5.1)
Sodium: 141 mEq/L (ref 136–145)
Total Protein: 7.6 g/dL (ref 6.4–8.3)

## 2016-09-13 MED ORDER — ATEZOLIZUMAB CHEMO INJECTION 1200 MG/20ML
1200.0000 mg | Freq: Once | INTRAVENOUS | Status: AC
  Administered 2016-09-13: 1200 mg via INTRAVENOUS
  Filled 2016-09-13: qty 20

## 2016-09-13 MED ORDER — SODIUM CHLORIDE 0.9 % IJ SOLN
10.0000 mL | INTRAMUSCULAR | Status: DC | PRN
  Administered 2016-09-13: 10 mL
  Filled 2016-09-13: qty 10

## 2016-09-13 MED ORDER — DENOSUMAB 120 MG/1.7ML ~~LOC~~ SOLN
120.0000 mg | Freq: Once | SUBCUTANEOUS | Status: AC
  Administered 2016-09-13: 120 mg via SUBCUTANEOUS
  Filled 2016-09-13: qty 1.7

## 2016-09-13 MED ORDER — HEPARIN SOD (PORK) LOCK FLUSH 100 UNIT/ML IV SOLN
500.0000 [IU] | Freq: Once | INTRAVENOUS | Status: AC | PRN
  Administered 2016-09-13: 500 [IU]
  Filled 2016-09-13: qty 5

## 2016-09-13 MED ORDER — SODIUM CHLORIDE 0.9 % IJ SOLN
10.0000 mL | INTRAMUSCULAR | Status: DC | PRN
  Administered 2016-09-13: 10 mL via INTRAVENOUS
  Filled 2016-09-13: qty 10

## 2016-09-13 MED ORDER — SODIUM CHLORIDE 0.9 % IV SOLN
Freq: Once | INTRAVENOUS | Status: AC
  Administered 2016-09-13: 13:00:00 via INTRAVENOUS

## 2016-09-13 MED ORDER — MIRTAZAPINE 15 MG PO TABS: 15.0000 mg | ORAL_TABLET | Freq: Every day | ORAL | 0 refills | Status: DC

## 2016-09-13 NOTE — Patient Instructions (Signed)

## 2016-09-13 NOTE — Telephone Encounter (Signed)
Scheduled appt per 6/14 los - Gave patient AVS and calender. Central radiology to contact patient with PET schedule.

## 2016-09-13 NOTE — Patient Instructions (Signed)
[facility] Discharge Instructions for Patients Receiving Chemotherapy  Today you received the following chemotherapy agents: Tencentriq  To help prevent nausea and vomiting after your treatment, we encourage you to take your nausea medication as directed.    If you develop nausea and vomiting that is not controlled by your nausea medication, call the clinic.   BELOW ARE SYMPTOMS THAT SHOULD BE REPORTED IMMEDIATELY:  *FEVER GREATER THAN 100.5 F  *CHILLS WITH OR WITHOUT FEVER  NAUSEA AND VOMITING THAT IS NOT CONTROLLED WITH YOUR NAUSEA MEDICATION  *UNUSUAL SHORTNESS OF BREATH  *UNUSUAL BRUISING OR BLEEDING  TENDERNESS IN MOUTH AND THROAT WITH OR WITHOUT PRESENCE OF ULCERS  *URINARY PROBLEMS  *BOWEL PROBLEMS  UNUSUAL RASH Items with * indicate a potential emergency and should be followed up as soon as possible.  Feel free to call the clinic you have any questions or concerns. The clinic phone number is [phone].  Please show the CHEMO ALERT CARD at check-in to the Emergency Department and triage nurse.      

## 2016-09-13 NOTE — Patient Instructions (Signed)
Thank you for choosing [facility] to provide your oncology and hematology care.  To afford each patient quality time with our providers, please arrive 30 minutes before your scheduled appointment time.  If you arrive late for your appointment, you may be asked to reschedule.  We strive to give you quality time with our providers, and arriving late affects you and other patients whose appointments are after yours.  If you are a no show for multiple scheduled visits, you may be dismissed from the clinic at the providers discretion.   Again, thank you for choosing [facility], our hope is that these requests will decrease the amount of time that you wait before being seen by our physicians.  ______________________________________________________________________ Should you have questions after your visit to the  Cancer Center, please contact our office at [phone] between the hours of 8:30 and 4:30 p.m.    Voicemails left after 4:30p.m will not be returned until the following business day.   For prescription refill requests, please have your pharmacy contact us directly.  Please also try to allow 48 hours for prescription requests.   Please contact the scheduling department for questions regarding scheduling.  For scheduling of procedures such as PET scans, CT scans, MRI, Ultrasound, etc please contact central scheduling at [phone].   Resources For Cancer Patients and Caregivers:  American Cancer Society:  800-227-2345  Can help patients locate various types of support and financial assistance Cancer Care: 1-800-813-HOPE (4673) Provides financial assistance, online support groups, medication/co-pay assistance.   [place] DSS:  336-641-3447 Where to apply for food stamps, Medicaid, and utility assistance Medicare Rights Center: 800-333-4114 Helps people with Medicare understand their rights and benefits, navigate the Medicare system, and secure the  quality healthcare they deserve SCAT: 336-333-6589 [place] [employer]'s shared-ride transportation service for eligible riders who have a disability that prevents them from riding the fixed route bus.   For additional information on assistance programs please contact our social worker:   Grier Hock/Abigail Elmore:  [phone] 

## 2016-09-18 NOTE — Progress Notes (Signed)
Hematology oncology clinic follow-up.  Date of service. 09/13/2016  Patient Care Team: [name], MD as PCP - General (Internal Medicine)   Urologist: Dr. 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  Diagnosis:  Widely metastatic transitional cell carcinoma from the right renal pelvis.  Treatment -Atezolizumab IV q3weeks  Alphonse Guild for bone mets  HISTORY OF PRESENTING ILLNESS: Please see my previous notes for details of initial presentation.  INTERVAL HISTORY  Tiffany [name] is here for her scheduled follow-up prior to her next cycle of Atezolizumab. She notes no new concerns. She notes that she taking her oral budesonide as needed and are inflammatory proctitis is under control with no significant rectal bleeding or diarrhea or rectal urgency. Chronic cough which is unchanged. No new shortness of breath. No new skin rashes. No abdominal pain. No new bone pains. No overt hematuria.  MEDICAL HISTORY:  Past Medical History:  Diagnosis Date  . Arthritis     knees 03-10-12 had Cortisone injection  . Cancer [facility]) june 2016   metastatic  . DDD (degenerative disc disease), cervical   . Edema leg    feet and ankles  . Esophagus disorder    Had esophagus stretched  . GERD (gastroesophageal reflux disease)    Benign stricture dilated in 2011  . Gout    Patient notes she has possible gout but has not been on chronic medications for this  . Headache(784.0)   . Hypertension   . Occipital neuralgia   . Occipital neuralgia    Related to cervical degenerative disc disease  . Peptic ulcer disease    Previous history in the remote past  . Ulcerative proctitis ([place])   . 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 ABDOMINAL HYSTERECTOMY    . APPENDECTOMY    .  CHOLECYSTECTOMY    . COLONOSCOPY WITH PROPOFOL  03/25/2012   Procedure: COLONOSCOPY WITH PROPOFOL;  Surgeon: Garlan Fair, MD;  Location: [facility];  Service: Endoscopy;  Laterality: N/A;  . DILATION AND CURETTAGE OF UTERUS    . ESOPHAGEAL DILATION     For benign stricture in 2011  . ESOPHAGOGASTRODUODENOSCOPY    . FLEXIBLE SIGMOIDOSCOPY N/A 04/26/2014   Procedure: FLEXIBLE SIGMOIDOSCOPY - UnSedated;  Surgeon: Garlan Fair, MD;  Location: [facility];  Service: Endoscopy;  Laterality: N/A;  . FLEXIBLE SIGMOIDOSCOPY N/A 01/24/2015   Procedure: FLEXIBLE SIGNMOIDOSCOPY W/ FLEET ENEMIA;  Surgeon: Garlan Fair, MD;  Location: [facility];  Service: Endoscopy;  Laterality: N/A;  . FLEXIBLE SIGMOIDOSCOPY N/A 05/15/2016   Procedure: FLEXIBLE SIGMOIDOSCOPY;  Surgeon: Garlan Fair, MD;  Location: [facility];  Service: Endoscopy;  Laterality: N/A;  pt needs to ahve an enema upon arrival   . NECK SURGERY     20 years ago  . TOTAL ABDOMINAL HYSTERECTOMY W/ BILATERAL SALPINGOOPHORECTOMY     At age 79 due to miscarriage and retained products of conception causing significant uterine bleeding. Patient has been on estrogen replacement therapy since then.    SOCIAL HISTORY: Social History   Social History  . Marital status: Married    Spouse name: N/A  . Number of children: N/A  . Years of education: N/A   Occupational History  . Not on file.   Social History Main Topics  . Smoking status: Former Smoker  Quit date: 11/01/1983  . Smokeless tobacco: Never Used  . Alcohol use Yes     Comment: occ  . Drug use: No  . Sexual activity: No   Other Topics Concern  . Not on file   Social History Narrative  . No narrative on file  Retired as Conservation officer, nature for [employer]. She is very well spoken and intelligent individual and exhibits a keen knowledge of her medical conditions.  FAMILY HISTORY: Family History  Problem Relation Age of Onset  . Lung cancer  Sister   . Prostate cancer Brother   . Uterine cancer Maternal Aunt   . Breast cancer Paternal Grandmother   . Hodgkin's lymphoma Sister     ALLERGIES:  is allergic to nitrofurantoin; indomethacin; and lisinopril.  MEDICATIONS:  Current Outpatient Prescriptions  Medication Sig Dispense Refill  . acetaminophen (TYLENOL) 650 MG CR tablet Take 1,300 mg by mouth 2 (two) times daily.    Marland Kitchen amLODipine (NORVASC) 5 MG tablet Take 5 mg by mouth daily.    Marland Kitchen azithromycin (ZITHROMAX) 250 MG tablet   5  . budesonide (ENTOCORT EC) 3 MG 24 hr capsule Take 1 capsule (3 mg total) by mouth daily. followup with your GI doctor to optimize further management. 30 capsule 1  . COLCRYS 0.6 MG tablet Take 0.6 mg by mouth once daily as needed for gout  1  . estradiol (ESTRACE) 0.5 MG tablet Take 0.5 mg by mouth daily.  1  . guaiFENesin (MUCINEX) 600 MG 12 hr tablet Take 600 mg by mouth 2 (two) times daily.     Marland Kitchen Histamine Dihydrochloride (AUSTRALIAN DREAM ARTHRITIS EX) Apply 1 application topically 2 (two) times daily.    . mirtazapine (REMERON) 15 MG tablet Take 1 tablet (15 mg total) by mouth at bedtime. 90 tablet 0  . PRESCRIPTION MEDICATION Chemo card    . Respiratory Therapy Supplies (FLUTTER) DEVI 1 each by Does not apply route daily. 1 each 0  . senna-docusate (SENNA S) 8.6-50 MG per tablet Take 2 tablets by mouth at bedtime. (Patient taking differently: Take 1-2 tablets by mouth daily as needed for mild constipation. ) 60 tablet 1  . triamcinolone ointment (KENALOG) 0.5 % Apply 1 application topically 2 (two) times daily. (Patient taking differently: Apply 1 application topically 2 (two) times daily as needed (rash). ) 30 g 0   No current facility-administered medications for this visit.    REVIEW OF SYSTEMS:    10 point review of systems done and is negative except as noted above  PHYSICAL EXAMINATION: ECOG PERFORMANCE STATUS: 1  Vitals:   09/13/16 1148  BP: (!) 139/58  Pulse: 71  Resp: 20    Temp: 97.7 F (36.5 C)   Filed Weights   09/13/16 1148  Weight: 123 lb 12.8 oz (56.2 kg)   GENERAL elderly Caucasian female, alert, no distress and comfortable SKIN: resolved skin rashes EYES: normal, conjunctiva are pink and non-injected, sclera clear OROPHARYNX:no exudate, no erythema and lips, buccal mucosa, and tongue normal  NECK: supple, thyroid normal size, non-tender, without nodularity LYMPH:  no palpable lymphadenopathy in the cervical, axillary or inguinal LUNGS: few bibasilar rales, scattered rhonci, good air entry. HEART: regular rate & rhythm and no murmurs and no lower extremity edema ABDOMEN:abdomen soft, non-tender and normal bowel sounds Musculoskeletal:no cyanosis of digits and no clubbing  PSYCH: alert & oriented x 3 with fluent speech NEURO: no focal motor/sensory deficits  LABORATORY DATA:  CBC Latest Ref Rng & Units  09/13/2016 08/23/2016 08/02/2016  WBC 3.9 - 10.3 10e3/uL 4.7 5.5 6.2  Hemoglobin 11.6 - 15.9 g/dL 9.6(L) 10.2(L) 10.0(L)  Hematocrit 34.8 - 46.6 % 31.2(L) 32.9(L) 30.6(L)  Platelets 145 - 400 10e3/uL 327 266 353   . CMP Latest Ref Rng & Units 09/13/2016 08/23/2016 08/02/2016  Glucose 70 - 140 mg/dl 81 86 80  BUN 7.0 - 26.0 mg/dL 15.5 17.7 14.0  Creatinine 0.6 - 1.1 mg/dL 1.0 1.0 0.9  Sodium 136 - 145 mEq/L 141 138 138  Potassium 3.5 - 5.1 mEq/L 4.4 4.6 4.2  Chloride 101 - 111 mmol/L - - -  CO2 22 - 29 mEq/L 24 25 22   Calcium 8.4 - 10.4 mg/dL 9.4 9.2 9.2  Total Protein 6.4 - 8.3 g/dL 7.6 7.7 7.2  Total Bilirubin 0.20 - 1.20 mg/dL 0.45 0.44 0.37  Alkaline Phos 40 - 150 U/L 38(L) 42 38(L)  AST 5 - 34 U/L 16 17 14   ALT 0-55 U/L U/L 6 8 7     Radiology .No results found.  ASSESSMENT & PLAN:   83 year old Caucasian female with  #1 Right Renal pelvis metastatic transitional cell carcinoma.   Initial PET scan revealed metastases to the lungs, liver, multiple nodal stations and bones.  PET scan done after 8 cycles of treatment for restaging  disease showed significant response to treatment with no evidence of active disease. Patient has no clinical evidence of disease progression at this time. CT chest 06/16/2015 showed no evidence of cancer progression. PET/CT scan done on 10/21/2015- showed no findings for metastatic disease. New right-sided hydronephrosis versus local tumor recurrence needs to be evaluated further. MRI of the kidney showed hyperenhancing the right renal pelvis lesion consistent with concern for local recurrence.  PET/CT 03/06/2016 with no findings of metastatic disease. Extensive right-sided hydronephrosis with high activity from excreted FDG obscuring any findings along the collecting system.  PET/CT  06/13/2016- Interval increase in size of right kidney. Findings are suspicious for progression of residual/recurrent urothelial carcinoma with further dilatation of the right renal collecting system. 2. No evidence for hypermetabolic metastases.   Plan -Patient has no new treatment toxicities.  No clinical symptoms suggestive of overt disease recurrence/progression at this time. -We'll continue her Atezolizumab q3weeks. -Continue Xgeva every 6 weeks - will plan to space out or discontinue after completion of 2 years of treatment. -PET/CT in 5 weeks -RTC with Dr Irene Limbo in 6 weeks with subsequent dose of Atezolizumab   #2 h/o skin rash likely related to her Atezolizumab. Minimal grade 1 and topical triamcinolone . Currently quiescent.  #3 Cough and some clear secretions likely due to bronchiectasis . Followed by Dr Melvyn Novas. . No chest pain no increased shortness of breath or dyspnea on exertion . Plan -Has an action plan with Dr. Melvyn Novas to use when necessary antibiotics for worsening symptoms. -Continue follow-up with Dr. 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 Wynetta Emery.  But stopped  using it due to cost issues. Was then on PO budesonide which she has stopped as well. had recent sigmoidscopy with Dr Wynetta Emery - showed stable ulcerative proctitis  Plan -will use po budesonide 3mg  po daily on as needed intermittent basis based on symptoms.  #4 history of hypertension   -on Amlodipine   -Continue Atezolizumab q3weeks -PET/CT in 5 weeks -RTC with Dr Irene Limbo in 6 weeks with subsequent dose of Atezolizumab  Sullivan Lone MD [place] Hematology/Oncology Physician [facility]  (Office):       [phone] (Work cell):  [phone] (Fax):           [phone]

## 2016-09-19 DIAGNOSIS — H524 Presbyopia: Secondary | ICD-10-CM | POA: Diagnosis not present

## 2016-10-04 ENCOUNTER — Other Ambulatory Visit (HOSPITAL_BASED_OUTPATIENT_CLINIC_OR_DEPARTMENT_OTHER): Payer: Medicare Other

## 2016-10-04 ENCOUNTER — Ambulatory Visit: Payer: Medicare Other

## 2016-10-04 ENCOUNTER — Ambulatory Visit (HOSPITAL_BASED_OUTPATIENT_CLINIC_OR_DEPARTMENT_OTHER): Payer: Medicare Other

## 2016-10-04 VITALS — BP 158/73 | HR 74 | Temp 97.9°F | Resp 18

## 2016-10-04 DIAGNOSIS — Z79899 Other long term (current) drug therapy: Secondary | ICD-10-CM | POA: Diagnosis not present

## 2016-10-04 DIAGNOSIS — C651 Malignant neoplasm of right renal pelvis: Secondary | ICD-10-CM

## 2016-10-04 DIAGNOSIS — Z5112 Encounter for antineoplastic immunotherapy: Secondary | ICD-10-CM | POA: Diagnosis not present

## 2016-10-04 DIAGNOSIS — C787 Secondary malignant neoplasm of liver and intrahepatic bile duct: Secondary | ICD-10-CM

## 2016-10-04 DIAGNOSIS — Z95828 Presence of other vascular implants and grafts: Secondary | ICD-10-CM

## 2016-10-04 DIAGNOSIS — C659 Malignant neoplasm of unspecified renal pelvis: Secondary | ICD-10-CM

## 2016-10-04 DIAGNOSIS — C7951 Secondary malignant neoplasm of bone: Secondary | ICD-10-CM

## 2016-10-04 LAB — CBC & DIFF AND RETIC
BASO%: 0.2 % (ref 0.0–2.0)
BASOS ABS: 0 10*3/uL (ref 0.0–0.1)
EOS%: 1.1 % (ref 0.0–7.0)
Eosinophils Absolute: 0.1 10*3/uL (ref 0.0–0.5)
HEMATOCRIT: 31 % — AB (ref 34.8–46.6)
HGB: 9.6 g/dL — ABNORMAL LOW (ref 11.6–15.9)
Immature Retic Fract: 15.1 % — ABNORMAL HIGH (ref 1.60–10.00)
LYMPH#: 2.1 10*3/uL (ref 0.9–3.3)
LYMPH%: 46.3 % (ref 14.0–49.7)
MCH: 28.4 pg (ref 25.1–34.0)
MCHC: 31 g/dL — AB (ref 31.5–36.0)
MCV: 91.7 fL (ref 79.5–101.0)
MONO#: 0.5 10*3/uL (ref 0.1–0.9)
MONO%: 11.7 % (ref 0.0–14.0)
NEUT#: 1.8 10*3/uL (ref 1.5–6.5)
NEUT%: 40.7 % (ref 38.4–76.8)
PLATELETS: 266 10*3/uL (ref 145–400)
RBC: 3.38 10*6/uL — ABNORMAL LOW (ref 3.70–5.45)
RDW: 16.5 % — ABNORMAL HIGH (ref 11.2–14.5)
RETIC CT ABS: 64.22 10*3/uL (ref 33.70–90.70)
Retic %: 1.9 % (ref 0.70–2.10)
WBC: 4.4 10*3/uL (ref 3.9–10.3)

## 2016-10-04 LAB — COMPREHENSIVE METABOLIC PANEL
ALT: 7 U/L (ref 0–55)
ANION GAP: 10 meq/L (ref 3–11)
AST: 19 U/L (ref 5–34)
Albumin: 3.3 g/dL — ABNORMAL LOW (ref 3.5–5.0)
Alkaline Phosphatase: 39 U/L — ABNORMAL LOW (ref 40–150)
BILIRUBIN TOTAL: 0.44 mg/dL (ref 0.20–1.20)
BUN: 13.1 mg/dL (ref 7.0–26.0)
CALCIUM: 9.5 mg/dL (ref 8.4–10.4)
CO2: 24 mEq/L (ref 22–29)
CREATININE: 1 mg/dL (ref 0.6–1.1)
Chloride: 106 mEq/L (ref 98–109)
EGFR: 52 mL/min/{1.73_m2} — AB (ref >90)
Glucose: 92 mg/dl (ref 70–140)
Potassium: 4.4 mEq/L (ref 3.5–5.1)
Sodium: 140 mEq/L (ref 136–145)
TOTAL PROTEIN: 7.6 g/dL (ref 6.4–8.3)

## 2016-10-04 LAB — TSH: TSH: 0.861 m[IU]/L (ref 0.308–3.960)

## 2016-10-04 MED ORDER — SODIUM CHLORIDE 0.9 % IJ SOLN
10.0000 mL | INTRAMUSCULAR | Status: DC | PRN
  Filled 2016-10-04: qty 10

## 2016-10-04 MED ORDER — ATEZOLIZUMAB CHEMO INJECTION 1200 MG/20ML
1200.0000 mg | Freq: Once | INTRAVENOUS | Status: AC
  Administered 2016-10-04: 1200 mg via INTRAVENOUS
  Filled 2016-10-04: qty 20

## 2016-10-04 MED ORDER — HEPARIN SOD (PORK) LOCK FLUSH 100 UNIT/ML IV SOLN
500.0000 [IU] | Freq: Once | INTRAVENOUS | Status: AC | PRN
  Administered 2016-10-04: 500 [IU]
  Filled 2016-10-04: qty 5

## 2016-10-04 MED ORDER — SODIUM CHLORIDE 0.9 % IJ SOLN
10.0000 mL | INTRAMUSCULAR | Status: DC | PRN
  Administered 2016-10-04 (×2): 10 mL via INTRAVENOUS
  Filled 2016-10-04: qty 10

## 2016-10-04 MED ORDER — SODIUM CHLORIDE 0.9 % IV SOLN
Freq: Once | INTRAVENOUS | Status: AC
  Administered 2016-10-04: 15:00:00 via INTRAVENOUS

## 2016-10-04 NOTE — Patient Instructions (Signed)
Cancer Center Discharge Instructions for Patients Receiving Chemotherapy  Today you received the following chemotherapy agents: Tecentriq  To help prevent nausea and vomiting after your treatment, we encourage you to take your nausea medication as directed.    If you develop nausea and vomiting that is not controlled by your nausea medication, call the clinic.   BELOW ARE SYMPTOMS THAT SHOULD BE REPORTED IMMEDIATELY:  *FEVER GREATER THAN 100.5 F  *CHILLS WITH OR WITHOUT FEVER  NAUSEA AND VOMITING THAT IS NOT CONTROLLED WITH YOUR NAUSEA MEDICATION  *UNUSUAL SHORTNESS OF BREATH  *UNUSUAL BRUISING OR BLEEDING  TENDERNESS IN MOUTH AND THROAT WITH OR WITHOUT PRESENCE OF ULCERS  *URINARY PROBLEMS  *BOWEL PROBLEMS  UNUSUAL RASH Items with * indicate a potential emergency and should be followed up as soon as possible.  Feel free to call the clinic you have any questions or concerns. The clinic phone number is [phone].  Please show the CHEMO ALERT CARD at check-in to the Emergency Department and triage nurse.   

## 2016-10-22 ENCOUNTER — Encounter (HOSPITAL_COMMUNITY)
Admission: RE | Admit: 2016-10-22 | Discharge: 2016-10-22 | Disposition: A | Discharge status: Home / Self Care | Payer: Medicare Other | Source: Ambulatory Visit | Attending: Hematology | Admitting: Hematology

## 2016-10-22 DIAGNOSIS — C651 Malignant neoplasm of right renal pelvis: Secondary | ICD-10-CM | POA: Diagnosis not present

## 2016-10-22 DIAGNOSIS — C7951 Secondary malignant neoplasm of bone: Secondary | ICD-10-CM | POA: Diagnosis not present

## 2016-10-22 DIAGNOSIS — C7900 Secondary malignant neoplasm of unspecified kidney and renal pelvis: Secondary | ICD-10-CM | POA: Diagnosis not present

## 2016-10-22 DIAGNOSIS — C787 Secondary malignant neoplasm of liver and intrahepatic bile duct: Secondary | ICD-10-CM | POA: Insufficient documentation

## 2016-10-22 LAB — GLUCOSE, CAPILLARY: Glucose-Capillary: 86 mg/dL (ref 65–99)

## 2016-10-22 MED ORDER — FLUDEOXYGLUCOSE F - 18 (FDG) INJECTION
6.1000 | Freq: Once | INTRAVENOUS | Status: AC | PRN
  Administered 2016-10-22: 6.1 via INTRAVENOUS

## 2016-10-25 ENCOUNTER — Telehealth: Payer: Self-pay | Admitting: Hematology

## 2016-10-25 ENCOUNTER — Other Ambulatory Visit (HOSPITAL_BASED_OUTPATIENT_CLINIC_OR_DEPARTMENT_OTHER): Payer: Medicare Other

## 2016-10-25 ENCOUNTER — Ambulatory Visit: Payer: Medicare Other

## 2016-10-25 ENCOUNTER — Ambulatory Visit (HOSPITAL_BASED_OUTPATIENT_CLINIC_OR_DEPARTMENT_OTHER): Payer: Medicare Other | Admitting: Hematology

## 2016-10-25 ENCOUNTER — Ambulatory Visit (HOSPITAL_BASED_OUTPATIENT_CLINIC_OR_DEPARTMENT_OTHER): Payer: Medicare Other

## 2016-10-25 VITALS — BP 147/63 | HR 79 | Temp 98.2°F | Resp 17 | Ht 61.0 in | Wt 123.8 lb

## 2016-10-25 DIAGNOSIS — J479 Bronchiectasis, uncomplicated: Secondary | ICD-10-CM

## 2016-10-25 DIAGNOSIS — C787 Secondary malignant neoplasm of liver and intrahepatic bile duct: Secondary | ICD-10-CM | POA: Diagnosis not present

## 2016-10-25 DIAGNOSIS — C7800 Secondary malignant neoplasm of unspecified lung: Secondary | ICD-10-CM | POA: Diagnosis not present

## 2016-10-25 DIAGNOSIS — Z5112 Encounter for antineoplastic immunotherapy: Secondary | ICD-10-CM

## 2016-10-25 DIAGNOSIS — R05 Cough: Secondary | ICD-10-CM

## 2016-10-25 DIAGNOSIS — I10 Essential (primary) hypertension: Secondary | ICD-10-CM | POA: Diagnosis not present

## 2016-10-25 DIAGNOSIS — C7951 Secondary malignant neoplasm of bone: Secondary | ICD-10-CM

## 2016-10-25 DIAGNOSIS — C659 Malignant neoplasm of unspecified renal pelvis: Secondary | ICD-10-CM

## 2016-10-25 DIAGNOSIS — C778 Secondary and unspecified malignant neoplasm of lymph nodes of multiple regions: Secondary | ICD-10-CM

## 2016-10-25 DIAGNOSIS — K529 Noninfective gastroenteritis and colitis, unspecified: Secondary | ICD-10-CM

## 2016-10-25 DIAGNOSIS — C651 Malignant neoplasm of right renal pelvis: Secondary | ICD-10-CM | POA: Diagnosis not present

## 2016-10-25 DIAGNOSIS — Z95828 Presence of other vascular implants and grafts: Secondary | ICD-10-CM

## 2016-10-25 DIAGNOSIS — J679 Hypersensitivity pneumonitis due to unspecified organic dust: Secondary | ICD-10-CM

## 2016-10-25 DIAGNOSIS — K625 Hemorrhage of anus and rectum: Secondary | ICD-10-CM

## 2016-10-25 LAB — CBC & DIFF AND RETIC
BASO%: 0.2 % (ref 0.0–2.0)
BASOS ABS: 0 10*3/uL (ref 0.0–0.1)
EOS%: 0.8 % (ref 0.0–7.0)
Eosinophils Absolute: 0 10*3/uL (ref 0.0–0.5)
HEMATOCRIT: 31.8 % — AB (ref 34.8–46.6)
HGB: 9.8 g/dL — ABNORMAL LOW (ref 11.6–15.9)
Immature Retic Fract: 9 % (ref 1.60–10.00)
LYMPH%: 47.4 % (ref 14.0–49.7)
MCH: 28.4 pg (ref 25.1–34.0)
MCHC: 30.8 g/dL — AB (ref 31.5–36.0)
MCV: 92.2 fL (ref 79.5–101.0)
MONO#: 0.6 10*3/uL (ref 0.1–0.9)
MONO%: 12.1 % (ref 0.0–14.0)
NEUT#: 2 10*3/uL (ref 1.5–6.5)
NEUT%: 39.5 % (ref 38.4–76.8)
PLATELETS: 295 10*3/uL (ref 145–400)
RBC: 3.45 10*6/uL — ABNORMAL LOW (ref 3.70–5.45)
RDW: 16.1 % — AB (ref 11.2–14.5)
Retic %: 1.81 % (ref 0.70–2.10)
Retic Ct Abs: 62.45 10*3/uL (ref 33.70–90.70)
WBC: 5.1 10*3/uL (ref 3.9–10.3)
lymph#: 2.4 10*3/uL (ref 0.9–3.3)

## 2016-10-25 LAB — COMPREHENSIVE METABOLIC PANEL
ALBUMIN: 3.6 g/dL (ref 3.5–5.0)
ALK PHOS: 37 U/L — AB (ref 40–150)
ALT: 8 U/L (ref 0–55)
ANION GAP: 8 meq/L (ref 3–11)
AST: 19 U/L (ref 5–34)
BILIRUBIN TOTAL: 0.4 mg/dL (ref 0.20–1.20)
BUN: 17.5 mg/dL (ref 7.0–26.0)
CO2: 24 mEq/L (ref 22–29)
CREATININE: 1.1 mg/dL (ref 0.6–1.1)
Calcium: 9.5 mg/dL (ref 8.4–10.4)
Chloride: 106 mEq/L (ref 98–109)
EGFR: 44 mL/min/{1.73_m2} — AB (ref >90)
Glucose: 83 mg/dl (ref 70–140)
Potassium: 4.4 mEq/L (ref 3.5–5.1)
Sodium: 139 mEq/L (ref 136–145)
TOTAL PROTEIN: 7.7 g/dL (ref 6.4–8.3)

## 2016-10-25 MED ORDER — SODIUM CHLORIDE 0.9 % IV SOLN
Freq: Once | INTRAVENOUS | Status: AC
  Administered 2016-10-25: 15:00:00 via INTRAVENOUS

## 2016-10-25 MED ORDER — ATEZOLIZUMAB CHEMO INJECTION 1200 MG/20ML
1200.0000 mg | Freq: Once | INTRAVENOUS | Status: AC
  Administered 2016-10-25: 1200 mg via INTRAVENOUS
  Filled 2016-10-25: qty 20

## 2016-10-25 MED ORDER — DENOSUMAB 120 MG/1.7ML ~~LOC~~ SOLN
120.0000 mg | Freq: Once | SUBCUTANEOUS | Status: AC
  Administered 2016-10-25: 120 mg via SUBCUTANEOUS
  Filled 2016-10-25: qty 1.7

## 2016-10-25 MED ORDER — HEPARIN SOD (PORK) LOCK FLUSH 100 UNIT/ML IV SOLN
500.0000 [IU] | Freq: Once | INTRAVENOUS | Status: AC | PRN
  Administered 2016-10-25: 500 [IU]
  Filled 2016-10-25: qty 5

## 2016-10-25 MED ORDER — SODIUM CHLORIDE 0.9% FLUSH
10.0000 mL | Freq: Once | INTRAVENOUS | Status: AC
  Administered 2016-10-25: 10 mL via INTRAVENOUS
  Filled 2016-10-25: qty 10

## 2016-10-25 MED ORDER — SODIUM CHLORIDE 0.9 % IJ SOLN
10.0000 mL | INTRAMUSCULAR | Status: DC | PRN
  Administered 2016-10-25: 10 mL
  Filled 2016-10-25: qty 10

## 2016-10-25 NOTE — Patient Instructions (Signed)
[facility] Discharge Instructions for Patients Receiving Chemotherapy  Today you received the following chemotherapy agents: Tencentriq  To help prevent nausea and vomiting after your treatment, we encourage you to take your nausea medication as directed.    If you develop nausea and vomiting that is not controlled by your nausea medication, call the clinic.   BELOW ARE SYMPTOMS THAT SHOULD BE REPORTED IMMEDIATELY:  *FEVER GREATER THAN 100.5 F  *CHILLS WITH OR WITHOUT FEVER  NAUSEA AND VOMITING THAT IS NOT CONTROLLED WITH YOUR NAUSEA MEDICATION  *UNUSUAL SHORTNESS OF BREATH  *UNUSUAL BRUISING OR BLEEDING  TENDERNESS IN MOUTH AND THROAT WITH OR WITHOUT PRESENCE OF ULCERS  *URINARY PROBLEMS  *BOWEL PROBLEMS  UNUSUAL RASH Items with * indicate a potential emergency and should be followed up as soon as possible.  Feel free to call the clinic you have any questions or concerns. The clinic phone number is [phone].  Please show the CHEMO ALERT CARD at check-in to the Emergency Department and triage nurse.      

## 2016-10-25 NOTE — Patient Instructions (Signed)
Implanted Port Home Guide An implanted port is a type of central line that is placed under the skin. Central lines are used to provide IV access when treatment or nutrition needs to be given through a person's veins. Implanted ports are used for long-term IV access. An implanted port may be placed because:  You need IV medicine that would be irritating to the small veins in your hands or arms.  You need long-term IV medicines, such as antibiotics.  You need IV nutrition for a long period.  You need frequent blood draws for lab tests.  You need dialysis.  Implanted ports are usually placed in the chest area, but they can also be placed in the upper arm, the abdomen, or the leg. An implanted port has two main parts:  Reservoir. The reservoir is round and will appear as a small, raised area under your skin. The reservoir is the part where a needle is inserted to give medicines or draw blood.  Catheter. The catheter is a thin, flexible tube that extends from the reservoir. The catheter is placed into a large vein. Medicine that is inserted into the reservoir goes into the catheter and then into the vein.  How will I care for my incision site? Do not get the incision site wet. Bathe or shower as directed by your health care provider. How is my port accessed? Special steps must be taken to access the port:  Before the port is accessed, a numbing cream can be placed on the skin. This helps numb the skin over the port site.  Your health care provider uses a sterile technique to access the port. ? Your health care provider must put on a mask and sterile gloves. ? The skin over your port is cleaned carefully with an antiseptic and allowed to dry. ? The port is gently pinched between sterile gloves, and a needle is inserted into the port.  Only "non-coring" port needles should be used to access the port. Once the port is accessed, a blood return should be checked. This helps ensure that the port  is in the vein and is not clogged.  If your port needs to remain accessed for a constant infusion, a clear (transparent) bandage will be placed over the needle site. The bandage and needle will need to be changed every week, or as directed by your health care provider.  Keep the bandage covering the needle clean and dry. Do not get it wet. Follow your health care provider's instructions on how to take a shower or bath while the port is accessed.  If your port does not need to stay accessed, no bandage is needed over the port.  What is flushing? Flushing helps keep the port from getting clogged. Follow your health care provider's instructions on how and when to flush the port. Ports are usually flushed with saline solution or a medicine called heparin. The need for flushing will depend on how the port is used.  If the port is used for intermittent medicines or blood draws, the port will need to be flushed: ? After medicines have been given. ? After blood has been drawn. ? As part of routine maintenance.  If a constant infusion is running, the port may not need to be flushed.  How long will my port stay implanted? The port can stay in for as long as your health care provider thinks it is needed. When it is time for the port to come out, surgery will be   done to remove it. The procedure is similar to the one performed when the port was put in. When should I seek immediate medical care? When you have an implanted port, you should seek immediate medical care if:  You notice a bad smell coming from the incision site.  You have swelling, redness, or drainage at the incision site.  You have more swelling or pain at the port site or the surrounding area.  You have a fever that is not controlled with medicine.  This information is not intended to replace advice given to you by your health care provider. Make sure you discuss any questions you have with your health care provider. Document  Released: 03/19/2005 Document Revised: 08/25/2015 Document Reviewed: 11/24/2012 Elsevier Interactive Patient Education  2017 Elsevier Inc.  

## 2016-10-25 NOTE — Telephone Encounter (Signed)
Gave patient avs report and appointments for August and September  °

## 2016-10-28 NOTE — Progress Notes (Signed)
Hematology oncology clinic follow-up.  Date of service. 10/25/2016  Patient Care Team: [name], MD as PCP - General (Internal Medicine)   Urologist: Dr. 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  Diagnosis:  Widely metastatic transitional cell carcinoma from the right renal pelvis.  Treatment -Atezolizumab IV q3weeks  Tiffany [name] for bone mets  HISTORY OF PRESENTING ILLNESS: Please see my previous notes for details of initial presentation.  INTERVAL HISTORY  Tiffany [name] is here for her with her son for her scheduled follow-up prior to her next cycle of Atezolizumab.  She had a repeat PET/CT scan on 10/22/2016 which shows some mild local progression of a right renal pelvic transitional cell carcinoma but no evidence of progression of metastatic disease . No new right flank or right kidney symptoms .no hematuria  She notes no new concerns. She notes that she taking her oral budesonide as needed and are inflammatory proctitis is under control with no significant rectal bleeding or diarrhea or rectal urgency. Chronic cough which is unchanged. No new shortness of breath. No new skin rashes. No abdominal pain. No new bone pains. No overt hematuria.  MEDICAL HISTORY:  Past Medical History:  Diagnosis Date  . Arthritis     knees 03-10-12 had Cortisone injection  . Cancer [facility]) june 2016   metastatic  . DDD (degenerative disc disease), cervical   . Edema leg    feet and ankles  . Esophagus disorder    Had esophagus stretched  . GERD (gastroesophageal reflux disease)    Benign stricture dilated in 2011  . Gout    Patient notes she has possible gout but has not been on chronic medications for this  . Headache(784.0)   . Hypertension   . Occipital neuralgia   . Occipital neuralgia    Related to cervical degenerative disc  disease  . Peptic ulcer disease    Previous history in the remote past  . Ulcerative proctitis ([place])   . Ulcerative proctitis ([place]) 2014    SURGICAL HISTORY: Past Surgical History:  Procedure Laterality Date  . ABDOMINAL HYSTERECTOMY    . APPENDECTOMY    . CHOLECYSTECTOMY    . COLONOSCOPY WITH PROPOFOL  03/25/2012   Procedure: COLONOSCOPY WITH PROPOFOL;  Surgeon: Garlan Fair, MD;  Location: [facility];  Service: Endoscopy;  Laterality: N/A;  . DILATION AND CURETTAGE OF UTERUS    . ESOPHAGEAL DILATION     For benign stricture in 2011  . ESOPHAGOGASTRODUODENOSCOPY    . FLEXIBLE SIGMOIDOSCOPY N/A 04/26/2014   Procedure: FLEXIBLE SIGMOIDOSCOPY - UnSedated;  Surgeon: Garlan Fair, MD;  Location: [facility];  Service: Endoscopy;  Laterality: N/A;  . FLEXIBLE SIGMOIDOSCOPY N/A 01/24/2015   Procedure: FLEXIBLE SIGNMOIDOSCOPY W/ FLEET ENEMIA;  Surgeon: Garlan Fair, MD;  Location: [facility];  Service: Endoscopy;  Laterality: N/A;  . FLEXIBLE SIGMOIDOSCOPY N/A 05/15/2016   Procedure: FLEXIBLE SIGMOIDOSCOPY;  Surgeon: Garlan Fair, MD;  Location: [facility];  Service: Endoscopy;  Laterality: N/A;  pt needs to ahve an enema upon arrival   . NECK SURGERY     20 years ago  . TOTAL ABDOMINAL HYSTERECTOMY W/ BILATERAL SALPINGOOPHORECTOMY     At age 57 due to miscarriage and retained products of conception causing significant uterine bleeding. Patient has been on estrogen replacement therapy since then.    SOCIAL HISTORY: Social History   Social  History  . Marital status: Married    Spouse name: N/A  . Number of children: N/A  . Years of education: N/A   Occupational History  . Not on file.   Social History Main Topics  . Smoking status: Former Smoker    Quit date: 11/01/1983  . Smokeless tobacco: Never Used  . Alcohol use Yes     Comment: occ  . Drug use: No  . Sexual activity: No   Other Topics Concern  . Not on file   Social History Narrative  . No  narrative on file  Retired as Conservation officer, nature for [employer]. She is very well spoken and intelligent individual and exhibits a keen knowledge of her medical conditions.  FAMILY HISTORY: Family History  Problem Relation Age of Onset  . Lung cancer Sister   . Prostate cancer Brother   . Uterine cancer Maternal Aunt   . Breast cancer Paternal Grandmother   . Hodgkin's lymphoma Sister     ALLERGIES:  is allergic to nitrofurantoin; indomethacin; and lisinopril.  MEDICATIONS:  Current Outpatient Prescriptions  Medication Sig Dispense Refill  . acetaminophen (TYLENOL) 650 MG CR tablet Take 1,300 mg by mouth 2 (two) times daily.    Marland Kitchen amLODipine (NORVASC) 5 MG tablet Take 5 mg by mouth daily.    Marland Kitchen azithromycin (ZITHROMAX) 250 MG tablet   5  . budesonide (ENTOCORT EC) 3 MG 24 hr capsule Take 1 capsule (3 mg total) by mouth daily. followup with your GI doctor to optimize further management. 30 capsule 1  . COLCRYS 0.6 MG tablet Take 0.6 mg by mouth once daily as needed for gout  1  . estradiol (ESTRACE) 0.5 MG tablet Take 0.5 mg by mouth daily.  1  . guaiFENesin (MUCINEX) 600 MG 12 hr tablet Take 600 mg by mouth 2 (two) times daily.     Marland Kitchen Histamine Dihydrochloride (AUSTRALIAN DREAM ARTHRITIS EX) Apply 1 application topically 2 (two) times daily.    . mirtazapine (REMERON) 15 MG tablet Take 1 tablet (15 mg total) by mouth at bedtime. 90 tablet 0  . PRESCRIPTION MEDICATION Chemo card    . Respiratory Therapy Supplies (FLUTTER) DEVI 1 each by Does not apply route daily. 1 each 0  . senna-docusate (SENNA S) 8.6-50 MG per tablet Take 2 tablets by mouth at bedtime. (Patient taking differently: Take 1-2 tablets by mouth daily as needed for mild constipation. ) 60 tablet 1  . triamcinolone ointment (KENALOG) 0.5 % Apply 1 application topically 2 (two) times daily. (Patient taking differently: Apply 1 application topically 2 (two) times daily as needed (rash). ) 30 g 0   No current  facility-administered medications for this visit.    REVIEW OF SYSTEMS:    10 point review of systems done and is negative except as noted above  PHYSICAL EXAMINATION: ECOG PERFORMANCE STATUS: 1  Vitals:   10/25/16 1317  BP: (!) 147/63  Pulse: 79  Resp: 17  Temp: 98.2 F (36.8 C)   Filed Weights   10/25/16 1317  Weight: 123 lb 12.8 oz (56.2 kg)   GENERAL elderly Caucasian female, alert, no distress and comfortable SKIN: resolved skin rashes EYES: normal, conjunctiva are pink and non-injected, sclera clear OROPHARYNX:no exudate, no erythema and lips, buccal mucosa, and tongue normal  NECK: supple, thyroid normal size, non-tender, without nodularity LYMPH:  no palpable lymphadenopathy in the cervical, axillary or inguinal LUNGS: few bibasilar rales, scattered rhonci, good air entry. HEART: regular rate & rhythm  and no murmurs and no lower extremity edema ABDOMEN:abdomen soft, non-tender and normal bowel sounds Musculoskeletal:no cyanosis of digits and no clubbing  PSYCH: alert & oriented x 3 with fluent speech NEURO: no focal motor/sensory deficits  LABORATORY DATA:  CBC Latest Ref Rng & Units 10/25/2016 10/04/2016 09/13/2016  WBC 3.9 - 10.3 10e3/uL 5.1 4.4 4.7  Hemoglobin 11.6 - 15.9 g/dL 9.8(L) 9.6(L) 9.6(L)  Hematocrit 34.8 - 46.6 % 31.8(L) 31.0(L) 31.2(L)  Platelets 145 - 400 10e3/uL 295 266 327   . CMP Latest Ref Rng & Units 10/25/2016 10/04/2016 09/13/2016  Glucose 70 - 140 mg/dl 83 92 81  BUN 7.0 - 26.0 mg/dL 17.5 13.1 15.5  Creatinine 0.6 - 1.1 mg/dL 1.1 1.0 1.0  Sodium 136 - 145 mEq/L 139 140 141  Potassium 3.5 - 5.1 mEq/L 4.4 4.4 4.4  Chloride 101 - 111 mmol/L - - -  CO2 22 - 29 mEq/L 24 24 24   Calcium 8.4 - 10.4 mg/dL 9.5 9.5 9.4  Total Protein 6.4 - 8.3 g/dL 7.7 7.6 7.6  Total Bilirubin 0.20 - 1.20 mg/dL 0.40 0.44 0.45  Alkaline Phos 40 - 150 U/L 37(L) 39(L) 38(L)  AST 5 - 34 U/L 19 19 16   ALT 0 - 55 U/L 8 7 <6    Radiology .Nm Pet Image Restag (ps) Skull  Base To Thigh  Result Date: 10/22/2016 CLINICAL DATA:  Subsequent treatment strategy for transitional cell carcinoma of the kidney with metastatic disease. EXAM: NUCLEAR MEDICINE PET SKULL BASE TO THIGH TECHNIQUE: 6.1 mCi F-18 FDG was injected intravenously. Full-ring PET imaging was performed from the skull base to thigh after the radiotracer. CT data was obtained and used for attenuation correction and anatomic localization. FASTING BLOOD GLUCOSE:  Value: 86 mg/dl COMPARISON:  06/13/2016 FINDINGS: NECK No hypermetabolic lymph nodes in the neck. CHEST No hypermetabolic mediastinal or hilar nodes. No suspicious pulmonary nodules on the CT scan. Right Port-A-Cath tip is positioned at the distal SVC/RA junction. Lung parenchyma demonstrates emphysema. Bronchial wall thickening noted in the lower lungs bilaterally with areas of airway impaction and peribronchovascular nodularity in both posterior lower lobes, left greater than right. ABDOMEN/PELVIS Abnormal soft tissue associated with the right kidney continues to progress with the area measuring 6.1 x 7.3 cm today compared to 6.0 x 5.3 cm previously. This region is markedly hypermetabolic with SUV max = 26. No abnormal hypermetabolic activity within the liver, pancreas, adrenal glands, or spleen. No hypermetabolic lymph nodes in the abdomen or pelvis. Atherosclerotic calcification noted in the wall of the abdominal aorta. Gallbladder is surgically absent. SKELETON No focal hypermetabolic activity to suggest skeletal metastasis. IMPRESSION: 1. Continued interval progression of the abnormal soft tissue associated with the right kidney. This soft tissue remains markedly hypermetabolic and is compatible with continued progression of recurrent right renal disease. 2. No evidence for hypermetabolic metastases in the neck, chest, abdomen, or pelvis. 3. Bronchial wall thickening with patchy peribronchovascular nodularity in the dependent lower lobes bilaterally.  Bronchopneumonia or atypical infection would be considerations. 4.  Aortic Atherosclerois (ICD10-170.0) Electronically Signed   By: Misty Stanley M.D.   On: 10/22/2016 13:35    ASSESSMENT & PLAN:   83 year old Caucasian female with  #1 Right Renal pelvis metastatic transitional cell carcinoma.   Initial PET scan revealed metastases to the lungs, liver, multiple nodal stations and bones.  PET scan done after 8 cycles of treatment for restaging disease showed significant response to treatment with no evidence of active disease. Patient has no clinical  evidence of disease progression at this time. CT chest 06/16/2015 showed no evidence of cancer progression. PET/CT scan done on 10/21/2015- showed no findings for metastatic disease. New right-sided hydronephrosis versus local tumor recurrence needs to be evaluated further. MRI of the kidney showed hyperenhancing the right renal pelvis lesion consistent with concern for local recurrence.  PET/CT 03/06/2016 with no findings of metastatic disease. Extensive right-sided hydronephrosis with high activity from excreted FDG obscuring any findings along the collecting system.  PET/CT  06/13/2016- Interval increase in size of right kidney. Findings are suspicious for progression of residual/recurrent urothelial carcinoma with further dilatation of the right renal collecting system. 2. No evidence for hypermetabolic metastases.   PET/CT 10/22/2016 - Continued interval progression of the abnormal soft tissue associated with the right kidney. This soft tissue remains markedly hypermetabolic and is compatible with continued progression of recurrent right renal disease. 2. No evidence for hypermetabolic metastases in the neck, chest, abdomen, or pelvis.  Plan -Patient has no new treatment toxicities.  No clinical symptoms suggestive of overt disease recurrence/progression at this time. -PET/CT results were discussed in details with the patient and her son. She has  some asymptomatic possible local progression of a right renal pelvic tumor with no evidence of progression of her metastatic disease. -The local progression is asymptomatic with no hematuria no pain no signs of urinary tract infection change in kidney function .all of her widespread metastatic disease still remains controlled and non-active . -We'll continue her Atezolizumab q3weeks. -Continue Xgeva every 6 weeks - will plan to space out or discontinue after completion of 2 years of treatment. -Has been previously seen by radiation oncology with consideration for local radiation therapy if she develops symptomatic disease .  #2 h/o skin rash likely related to her Atezolizumab. Minimal grade 1 and topical triamcinolone . Currently quiescent.  #3 Cough and some clear secretions likely due to bronchiectasis . Followed by Dr Melvyn Novas. . No chest pain no increased shortness of breath or dyspnea on exertion . Plan -Has an action plan with Dr. Melvyn Novas to use when necessary antibiotics for worsening symptoms. -Continue follow-up with Dr. 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 Wynetta Emery.  But stopped using it due to cost issues. Was then on PO budesonide which she has stopped as well. had recent sigmoidscopy with Dr Wynetta Emery - showed stable ulcerative proctitis  Plan -will use po budesonide 3mg  po daily on as needed intermittent basis based on symptoms.  #4 history of hypertension   -on Amlodipine   -Continue Atezolizumab q3 weeks . plz schedule next 3 cycles. Delton See every 6 weeks -continue -labs q3weeks -RTC with Dr Irene Limbo in 3 weeks with labs    Sullivan Lone MD [place] [facility]/On[facility] Physician [facility]  (Office):       [phone] (Work cell):  [phone] (Fax):           [phone]

## 2016-10-31 DIAGNOSIS — R350 Frequency of micturition: Secondary | ICD-10-CM | POA: Diagnosis not present

## 2016-11-06 DIAGNOSIS — C689 Malignant neoplasm of urinary organ, unspecified: Secondary | ICD-10-CM | POA: Diagnosis not present

## 2016-11-06 DIAGNOSIS — J479 Bronchiectasis, uncomplicated: Secondary | ICD-10-CM | POA: Diagnosis not present

## 2016-11-06 DIAGNOSIS — I10 Essential (primary) hypertension: Secondary | ICD-10-CM | POA: Diagnosis not present

## 2016-11-15 ENCOUNTER — Other Ambulatory Visit (HOSPITAL_BASED_OUTPATIENT_CLINIC_OR_DEPARTMENT_OTHER): Payer: Medicare Other

## 2016-11-15 ENCOUNTER — Telehealth: Payer: Self-pay | Admitting: Hematology

## 2016-11-15 ENCOUNTER — Ambulatory Visit (HOSPITAL_BASED_OUTPATIENT_CLINIC_OR_DEPARTMENT_OTHER): Payer: Medicare Other | Admitting: Hematology

## 2016-11-15 ENCOUNTER — Ambulatory Visit: Payer: Medicare Other

## 2016-11-15 ENCOUNTER — Encounter: Payer: Self-pay | Admitting: Hematology

## 2016-11-15 ENCOUNTER — Ambulatory Visit (HOSPITAL_BASED_OUTPATIENT_CLINIC_OR_DEPARTMENT_OTHER): Payer: Medicare Other

## 2016-11-15 VITALS — BP 156/58 | HR 76 | Temp 98.3°F | Resp 17 | Ht 61.0 in | Wt 123.9 lb

## 2016-11-15 DIAGNOSIS — C7800 Secondary malignant neoplasm of unspecified lung: Secondary | ICD-10-CM

## 2016-11-15 DIAGNOSIS — K5120 Ulcerative (chronic) proctitis without complications: Secondary | ICD-10-CM

## 2016-11-15 DIAGNOSIS — C778 Secondary and unspecified malignant neoplasm of lymph nodes of multiple regions: Secondary | ICD-10-CM | POA: Diagnosis not present

## 2016-11-15 DIAGNOSIS — C651 Malignant neoplasm of right renal pelvis: Secondary | ICD-10-CM

## 2016-11-15 DIAGNOSIS — C787 Secondary malignant neoplasm of liver and intrahepatic bile duct: Secondary | ICD-10-CM

## 2016-11-15 DIAGNOSIS — C659 Malignant neoplasm of unspecified renal pelvis: Secondary | ICD-10-CM

## 2016-11-15 DIAGNOSIS — K625 Hemorrhage of anus and rectum: Secondary | ICD-10-CM

## 2016-11-15 DIAGNOSIS — R05 Cough: Secondary | ICD-10-CM

## 2016-11-15 DIAGNOSIS — C7951 Secondary malignant neoplasm of bone: Secondary | ICD-10-CM

## 2016-11-15 DIAGNOSIS — I10 Essential (primary) hypertension: Secondary | ICD-10-CM

## 2016-11-15 DIAGNOSIS — J679 Hypersensitivity pneumonitis due to unspecified organic dust: Secondary | ICD-10-CM

## 2016-11-15 DIAGNOSIS — Z5112 Encounter for antineoplastic immunotherapy: Secondary | ICD-10-CM | POA: Diagnosis not present

## 2016-11-15 DIAGNOSIS — K529 Noninfective gastroenteritis and colitis, unspecified: Secondary | ICD-10-CM

## 2016-11-15 LAB — COMPREHENSIVE METABOLIC PANEL
ALK PHOS: 41 U/L (ref 40–150)
AST: 16 U/L (ref 5–34)
Albumin: 3.2 g/dL — ABNORMAL LOW (ref 3.5–5.0)
Anion Gap: 8 mEq/L (ref 3–11)
BUN: 16.4 mg/dL (ref 7.0–26.0)
CALCIUM: 9.5 mg/dL (ref 8.4–10.4)
CO2: 24 mEq/L (ref 22–29)
Chloride: 106 mEq/L (ref 98–109)
Creatinine: 1.1 mg/dL (ref 0.6–1.1)
EGFR: 44 mL/min/{1.73_m2} — ABNORMAL LOW (ref >90)
Glucose: 91 mg/dl (ref 70–140)
Potassium: 4.3 mEq/L (ref 3.5–5.1)
Sodium: 139 mEq/L (ref 136–145)
Total Bilirubin: 0.29 mg/dL (ref 0.20–1.20)
Total Protein: 7.5 g/dL (ref 6.4–8.3)

## 2016-11-15 LAB — CBC & DIFF AND RETIC
BASO%: 0.2 % (ref 0.0–2.0)
BASOS ABS: 0 10*3/uL (ref 0.0–0.1)
EOS ABS: 0.1 10*3/uL (ref 0.0–0.5)
EOS%: 1 % (ref 0.0–7.0)
HEMATOCRIT: 31.2 % — AB (ref 34.8–46.6)
HEMOGLOBIN: 9.5 g/dL — AB (ref 11.6–15.9)
Immature Retic Fract: 10.1 % — ABNORMAL HIGH (ref 1.60–10.00)
LYMPH%: 47.2 % (ref 14.0–49.7)
MCH: 27.8 pg (ref 25.1–34.0)
MCHC: 30.4 g/dL — ABNORMAL LOW (ref 31.5–36.0)
MCV: 91.2 fL (ref 79.5–101.0)
MONO#: 0.7 10*3/uL (ref 0.1–0.9)
MONO%: 12.9 % (ref 0.0–14.0)
NEUT#: 2 10*3/uL (ref 1.5–6.5)
NEUT%: 38.7 % (ref 38.4–76.8)
Platelets: 318 10*3/uL (ref 145–400)
RBC: 3.42 10*6/uL — ABNORMAL LOW (ref 3.70–5.45)
RDW: 15.9 % — AB (ref 11.2–14.5)
RETIC %: 1.51 % (ref 0.70–2.10)
RETIC CT ABS: 51.64 10*3/uL (ref 33.70–90.70)
WBC: 5.1 10*3/uL (ref 3.9–10.3)
lymph#: 2.4 10*3/uL (ref 0.9–3.3)

## 2016-11-15 MED ORDER — SODIUM CHLORIDE 0.9 % IJ SOLN
10.0000 mL | INTRAMUSCULAR | Status: DC | PRN
  Administered 2016-11-15: 10 mL
  Filled 2016-11-15: qty 10

## 2016-11-15 MED ORDER — HEPARIN SOD (PORK) LOCK FLUSH 100 UNIT/ML IV SOLN
500.0000 [IU] | Freq: Once | INTRAVENOUS | Status: AC | PRN
  Administered 2016-11-15: 500 [IU]
  Filled 2016-11-15: qty 5

## 2016-11-15 MED ORDER — SODIUM CHLORIDE 0.9 % IV SOLN
Freq: Once | INTRAVENOUS | Status: AC
  Administered 2016-11-15: 15:00:00 via INTRAVENOUS

## 2016-11-15 MED ORDER — SODIUM CHLORIDE 0.9 % IV SOLN
1200.0000 mg | Freq: Once | INTRAVENOUS | Status: AC
  Administered 2016-11-15: 1200 mg via INTRAVENOUS
  Filled 2016-11-15: qty 20

## 2016-11-15 NOTE — Patient Instructions (Signed)
Thank you for choosing [facility] to provide your oncology and hematology care.  To afford each patient quality time with our providers, please arrive 30 minutes before your scheduled appointment time.  If you arrive late for your appointment, you may be asked to reschedule.  We strive to give you quality time with our providers, and arriving late affects you and other patients whose appointments are after yours.  If you are a no show for multiple scheduled visits, you may be dismissed from the clinic at the providers discretion.   Again, thank you for choosing [facility], our hope is that these requests will decrease the amount of time that you wait before being seen by our physicians.  ______________________________________________________________________ Should you have questions after your visit to the [facility], please contact our office at [phone] between the hours of 8:30 and 4:30 p.m.    Voicemails left after 4:30p.m will not be returned until the following business day.   For prescription refill requests, please have your pharmacy contact us directly.  Please also try to allow 48 hours for prescription requests.   Please contact the scheduling department for questions regarding scheduling.  For scheduling of procedures such as PET scans, CT scans, MRI, Ultrasound, etc please contact central scheduling at [phone].   Resources For Cancer Patients and Caregivers:  American Cancer Society:  800-227-2345  Can help patients locate various types of support and financial assistance Cancer Care: 1-800-813-HOPE (4673) Provides financial assistance, online support groups, medication/co-pay assistance.   [place] DSS:  336-641-3447 Where to apply for food stamps, Medicaid, and utility assistance Medicare Rights Center: 800-333-4114 Helps people with Medicare understand their rights and benefits, navigate the Medicare system, and secure the  quality healthcare they deserve SCAT: 336-333-6589 [place] [employer]'s shared-ride transportation service for eligible riders who have a disability that prevents them from riding the fixed route bus.   For additional information on assistance programs please contact our social worker:   Grier Hock/Abigail Elmore:  [phone] 

## 2016-11-15 NOTE — Telephone Encounter (Signed)
Gave pt avs and calendar for upcoming dates.

## 2016-11-15 NOTE — Patient Instructions (Signed)
[facility] Discharge Instructions for Patients Receiving Chemotherapy  Today you received the following chemotherapy agents Tecentriq To help prevent nausea and vomiting after your treatment, we encourage you to take your nausea medication as prescribed.   If you develop nausea and vomiting that is not controlled by your nausea medication, call the clinic.   BELOW ARE SYMPTOMS THAT SHOULD BE REPORTED IMMEDIATELY:  *FEVER GREATER THAN 100.5 F  *CHILLS WITH OR WITHOUT FEVER  NAUSEA AND VOMITING THAT IS NOT CONTROLLED WITH YOUR NAUSEA MEDICATION  *UNUSUAL SHORTNESS OF BREATH  *UNUSUAL BRUISING OR BLEEDING  TENDERNESS IN MOUTH AND THROAT WITH OR WITHOUT PRESENCE OF ULCERS  *URINARY PROBLEMS  *BOWEL PROBLEMS  UNUSUAL RASH Items with * indicate a potential emergency and should be followed up as soon as possible.  Feel free to call the clinic you have any questions or concerns. The clinic phone number is [phone].  Please show the CHEMO ALERT CARD at check-in to the Emergency Department and triage nurse.   

## 2016-11-17 NOTE — Progress Notes (Signed)
Hematology oncology clinic follow-up.  Date of service. 11/15/2016  Patient Care Team: [name], MD as PCP - General (Internal Medicine)   Urologist: Dr. 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  Diagnosis:  Widely metastatic transitional cell carcinoma from the right renal pelvis.  Treatment -Atezolizumab IV q3weeks  Alphonse Guild for bone mets  HISTORY OF PRESENTING ILLNESS: Please see my previous notes for details of initial presentation.  INTERVAL HISTORY  Tiffany [name] is here for her with her son for her scheduled follow-up prior to her next cycle of Atezolizumab.  She notes no acute new symptoms. Reports having had a UTI recent treated with Abx with PCP. Breathing stable. Mild chronic cough.  No diarrhea no overt GI bleeding. No fevers/chills/night sweats/weight loss. Appetite reasonable. She is grateful that the treatment has given her 2 yrs to live thus far.  MEDICAL HISTORY:  Past Medical History:  Diagnosis Date  . Arthritis     knees 03-10-12 had Cortisone injection  . Cancer [facility]) june 2016   metastatic  . DDD (degenerative disc disease), cervical   . Edema leg    feet and ankles  . Esophagus disorder    Had esophagus stretched  . GERD (gastroesophageal reflux disease)    Benign stricture dilated in 2011  . Gout    Patient notes she has possible gout but has not been on chronic medications for this  . Headache(784.0)   . Hypertension   . Occipital neuralgia   . Occipital neuralgia    Related to cervical degenerative disc disease  . Peptic ulcer disease    Previous history in the remote past  . Ulcerative proctitis ([place])   . Ulcerative proctitis ([place]) 2014    SURGICAL HISTORY: Past Surgical History:  Procedure Laterality Date  . ABDOMINAL HYSTERECTOMY    . APPENDECTOMY    . CHOLECYSTECTOMY    .  COLONOSCOPY WITH PROPOFOL  03/25/2012   Procedure: COLONOSCOPY WITH PROPOFOL;  Surgeon: Garlan Fair, MD;  Location: [facility];  Service: Endoscopy;  Laterality: N/A;  . DILATION AND CURETTAGE OF UTERUS    . ESOPHAGEAL DILATION     For benign stricture in 2011  . ESOPHAGOGASTRODUODENOSCOPY    . FLEXIBLE SIGMOIDOSCOPY N/A 04/26/2014   Procedure: FLEXIBLE SIGMOIDOSCOPY - UnSedated;  Surgeon: Garlan Fair, MD;  Location: [facility];  Service: Endoscopy;  Laterality: N/A;  . FLEXIBLE SIGMOIDOSCOPY N/A 01/24/2015   Procedure: FLEXIBLE SIGNMOIDOSCOPY W/ FLEET ENEMIA;  Surgeon: Garlan Fair, MD;  Location: [facility];  Service: Endoscopy;  Laterality: N/A;  . FLEXIBLE SIGMOIDOSCOPY N/A 05/15/2016   Procedure: FLEXIBLE SIGMOIDOSCOPY;  Surgeon: Garlan Fair, MD;  Location: [facility];  Service: Endoscopy;  Laterality: N/A;  pt needs to ahve an enema upon arrival   . NECK SURGERY     20 years ago  . TOTAL ABDOMINAL HYSTERECTOMY W/ BILATERAL SALPINGOOPHORECTOMY     At age 19 due to miscarriage and retained products of conception causing significant uterine bleeding. Patient has been on estrogen replacement therapy since then.    SOCIAL HISTORY: Social History   Social History  . Marital status: Married    Spouse name: N/A  . Number of children: N/A  . Years of education: N/A   Occupational History  . Not on file.   Social History Main Topics  . Smoking status: Former Smoker  Quit date: 11/01/1983  . Smokeless tobacco: Never Used  . Alcohol use Yes     Comment: occ  . Drug use: No  . Sexual activity: No   Other Topics Concern  . Not on file   Social History Narrative  . No narrative on file  Retired as Conservation officer, nature for [employer]. She is very well spoken and intelligent individual and exhibits a keen knowledge of her medical conditions.  FAMILY HISTORY: Family History  Problem Relation Age of Onset  . Lung cancer Sister   . Prostate  cancer Brother   . Uterine cancer Maternal Aunt   . Breast cancer Paternal Grandmother   . Hodgkin's lymphoma Sister     ALLERGIES:  is allergic to nitrofurantoin; indomethacin; and lisinopril.  MEDICATIONS:  Current Outpatient Prescriptions  Medication Sig Dispense Refill  . vitamin B-12 (CYANOCOBALAMIN) 100 MCG tablet Take 100 mcg by mouth daily.    Marland Kitchen acetaminophen (TYLENOL) 650 MG CR tablet Take 1,300 mg by mouth 2 (two) times daily.    Marland Kitchen amLODipine (NORVASC) 5 MG tablet Take 5 mg by mouth daily.    . budesonide (ENTOCORT EC) 3 MG 24 hr capsule Take 1 capsule (3 mg total) by mouth daily. followup with your GI doctor to optimize further management. 30 capsule 1  . COLCRYS 0.6 MG tablet Take 0.6 mg by mouth once daily as needed for gout  1  . estradiol (ESTRACE) 0.5 MG tablet Take 0.5 mg by mouth daily.  1  . guaiFENesin (MUCINEX) 600 MG 12 hr tablet Take 600 mg by mouth 2 (two) times daily.     Marland Kitchen Histamine Dihydrochloride (AUSTRALIAN DREAM ARTHRITIS EX) Apply 1 application topically 2 (two) times daily.    . mirtazapine (REMERON) 15 MG tablet Take 1 tablet (15 mg total) by mouth at bedtime. 90 tablet 0  . PRESCRIPTION MEDICATION Chemo card    . Respiratory Therapy Supplies (FLUTTER) DEVI 1 each by Does not apply route daily. 1 each 0  . senna-docusate (SENNA S) 8.6-50 MG per tablet Take 2 tablets by mouth at bedtime. (Patient taking differently: Take 1-2 tablets by mouth daily as needed for mild constipation. ) 60 tablet 1  . triamcinolone ointment (KENALOG) 0.5 % Apply 1 application topically 2 (two) times daily. (Patient taking differently: Apply 1 application topically 2 (two) times daily as needed (rash). ) 30 g 0   No current facility-administered medications for this visit.    REVIEW OF SYSTEMS:    10 point review of systems done and is negative except as noted above  PHYSICAL EXAMINATION: ECOG PERFORMANCE STATUS: 1  Vitals:   11/15/16 1331  BP: (!) 156/58  Pulse: 76    Resp: 17  Temp: 98.3 F (36.8 C)  SpO2: 97%   Filed Weights   11/15/16 1331  Weight: 123 lb 14.4 oz (56.2 kg)   GENERAL elderly Caucasian female, alert, no distress and comfortable SKIN: resolved skin rashes EYES: normal, conjunctiva are pink and non-injected, sclera clear OROPHARYNX:no exudate, no erythema and lips, buccal mucosa, and tongue normal  NECK: supple, thyroid normal size, non-tender, without nodularity LYMPH:  no palpable lymphadenopathy in the cervical, axillary or inguinal LUNGS: few bibasilar rales, scattered rhonci, good air entry. HEART: regular rate & rhythm and no murmurs and no lower extremity edema ABDOMEN:abdomen soft, non-tender and normal bowel sounds Musculoskeletal:no cyanosis of digits and no clubbing  PSYCH: alert & oriented x 3 with fluent speech NEURO: no focal motor/sensory deficits  LABORATORY DATA:  CBC Latest Ref Rng & Units 11/15/2016 10/25/2016 10/04/2016  WBC 3.9 - 10.3 10e3/uL 5.1 5.1 4.4  Hemoglobin 11.6 - 15.9 g/dL 9.5(L) 9.8(L) 9.6(L)  Hematocrit 34.8 - 46.6 % 31.2(L) 31.8(L) 31.0(L)  Platelets 145 - 400 10e3/uL 318 295 266   . CMP Latest Ref Rng & Units 11/15/2016 10/25/2016 10/04/2016  Glucose 70 - 140 mg/dl 91 83 92  BUN 7.0 - 26.0 mg/dL 16.4 17.5 13.1  Creatinine 0.6 - 1.1 mg/dL 1.1 1.1 1.0  Sodium 136 - 145 mEq/L 139 139 140  Potassium 3.5 - 5.1 mEq/L 4.3 4.4 4.4  Chloride 101 - 111 mmol/L - - -  CO2 22 - 29 mEq/L 24 24 24   Calcium 8.4 - 10.4 mg/dL 9.5 9.5 9.5  Total Protein 6.4 - 8.3 g/dL 7.5 7.7 7.6  Total Bilirubin 0.20 - 1.20 mg/dL 0.29 0.40 0.44  Alkaline Phos 40 - 150 U/L 41 37(L) 39(L)  AST 5 - 34 U/L 16 19 19   ALT 0-55 U/L U/L 6 8 7     Radiology .Nm Pet Image Restag (ps) Skull Base To Thigh  Result Date: 10/22/2016 CLINICAL DATA:  Subsequent treatment strategy for transitional cell carcinoma of the kidney with metastatic disease. EXAM: NUCLEAR MEDICINE PET SKULL BASE TO THIGH TECHNIQUE: 6.1 mCi F-18 FDG was injected  intravenously. Full-ring PET imaging was performed from the skull base to thigh after the radiotracer. CT data was obtained and used for attenuation correction and anatomic localization. FASTING BLOOD GLUCOSE:  Value: 86 mg/dl COMPARISON:  06/13/2016 FINDINGS: NECK No hypermetabolic lymph nodes in the neck. CHEST No hypermetabolic mediastinal or hilar nodes. No suspicious pulmonary nodules on the CT scan. Right Port-A-Cath tip is positioned at the distal SVC/RA junction. Lung parenchyma demonstrates emphysema. Bronchial wall thickening noted in the lower lungs bilaterally with areas of airway impaction and peribronchovascular nodularity in both posterior lower lobes, left greater than right. ABDOMEN/PELVIS Abnormal soft tissue associated with the right kidney continues to progress with the area measuring 6.1 x 7.3 cm today compared to 6.0 x 5.3 cm previously. This region is markedly hypermetabolic with SUV max = 26. No abnormal hypermetabolic activity within the liver, pancreas, adrenal glands, or spleen. No hypermetabolic lymph nodes in the abdomen or pelvis. Atherosclerotic calcification noted in the wall of the abdominal aorta. Gallbladder is surgically absent. SKELETON No focal hypermetabolic activity to suggest skeletal metastasis. IMPRESSION: 1. Continued interval progression of the abnormal soft tissue associated with the right kidney. This soft tissue remains markedly hypermetabolic and is compatible with continued progression of recurrent right renal disease. 2. No evidence for hypermetabolic metastases in the neck, chest, abdomen, or pelvis. 3. Bronchial wall thickening with patchy peribronchovascular nodularity in the dependent lower lobes bilaterally. Bronchopneumonia or atypical infection would be considerations. 4.  Aortic Atherosclerois (ICD10-170.0) Electronically Signed   By: Misty Stanley M.D.   On: 10/22/2016 13:35    ASSESSMENT & PLAN:   83 year old Caucasian female with  #1 Right Renal  pelvis metastatic transitional cell carcinoma.   Initial PET scan revealed metastases to the lungs, liver, multiple nodal stations and bones.  PET scan done after 8 cycles of treatment for restaging disease showed significant response to treatment with no evidence of active disease. Patient has no clinical evidence of disease progression at this time. CT chest 06/16/2015 showed no evidence of cancer progression. PET/CT scan done on 10/21/2015- showed no findings for metastatic disease. New right-sided hydronephrosis versus local tumor recurrence needs to be evaluated further.  MRI of the kidney showed hyperenhancing the right renal pelvis lesion consistent with concern for local recurrence.  PET/CT 03/06/2016 with no findings of metastatic disease. Extensive right-sided hydronephrosis with high activity from excreted FDG obscuring any findings along the collecting system.  PET/CT  06/13/2016- Interval increase in size of right kidney. Findings are suspicious for progression of residual/recurrent urothelial carcinoma with further dilatation of the right renal collecting system. 2. No evidence for hypermetabolic metastases.   PET/CT 10/22/2016 - Continued interval progression of the abnormal soft tissue associated with the right kidney. This soft tissue remains markedly hypermetabolic and is compatible with continued progression of recurrent right renal disease. 2. No evidence for hypermetabolic metastases in the neck, chest, abdomen, or pelvis.  Plan -Patient has no new treatment toxicities.  No clinical symptoms suggestive of overt disease recurrence/progression at this time. -labs stable -We'll continue her Atezolizumab q3weeks. -Continue Xgeva every 6 weeks - will plan to space out or discontinue after completion of 2 years of treatment. -Has been previously seen by radiation oncology with consideration for local radiation therapy if she develops symptomatic disease - her local progression in the rt  renal pelvis is asymptomatic at this time.  #2 h/o skin rash likely related to her Atezolizumab. Minimal grade 1 and topical triamcinolone . Currently quiescent.  #3 Cough and some clear secretions likely due to bronchiectasis . Followed by Dr Melvyn Novas. . No chest pain no increased shortness of breath or dyspnea on exertion . Plan -Has an action plan with Dr. Melvyn Novas to use when necessary antibiotics for worsening symptoms. -Continue follow-up with Dr. 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 Wynetta Emery.  But stopped using it due to cost issues. Was then on PO budesonide which she has stopped as well. had recent sigmoidscopy with Dr Wynetta Emery - showed stable ulcerative proctitis  Plan -will use po budesonide 3mg  po daily on as needed intermittent basis based on symptoms. Has not needed this for a few months at this time.  #4 history of hypertension   -on Amlodipine   RTC with Dr Irene Limbo in 3 weeks with labs Continue Alemtuzumab q3weeks Continue Delton See q6 weeks     Sullivan Lone MD [facility]  (Office):       [phone] (Work cell):  [phone] (Fax):           [phone]

## 2016-12-04 NOTE — Progress Notes (Signed)
Hematology oncology clinic follow-up.  Date of service. 12/06/2016   Patient Care Team: [name], MD as PCP - General (Internal Medicine)   Urologist: Dr. 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  Diagnosis:  Widely metastatic transitional cell carcinoma from the right renal pelvis.  Treatment -Atezolizumab IV q3weeks  Tiffany [name] for bone mets  HISTORY OF PRESENTING ILLNESS: Please see my previous notes for details of initial presentation.  INTERVAL HISTORY Tiffany [name] is here for her with her husband for her scheduled follow-up. She reports with wanting to talk about her latest scan. She spoke on her Mild cough and her scan shows she has emphysema. She has been taking 12 hour mucinex for her cough and she has not been back to Dr. Melvyn Novas and wants to know should she go back to him sooner. She took azithromycin and does not think any changes has occurred to better her cough. I explained the change in her body due to her older age can effect her new baseline. She is now able to sleep at night and breathe. She also uses a humidifier to help. I suggested the option of a steam inhaler over-the-counter.  She also wanted to know if the only issue she has is with her kidney. I discussed that her scan only shows local disease progression in her kidney and no overt hypermetabolic disease in her metastatic locations.   This Sunday with be her 75th wedding anniversary.   She denies rectal bleeding and mentions she has a rash clearing up on her left arm which she uses topical steroid ointment for. She has not needed po budesonide for her rectal bleeding recently.  She notes that she does tire very easily and will need time to rest before moving more. She understands that is related to her old age.   Her right ankle will be painful at night but  during the day when she is walking it is fine. She wonders should she go see an orthopedist. I discussed if she cannot tolerate this she can try methods not involving steroids in her condition.   I encouraged her to get her pneumonia vaccine along with her flu vaccine. She agrees.    MEDICAL HISTORY:  Past Medical History:  Diagnosis Date  . Arthritis     knees 03-10-12 had Cortisone injection  . Cancer [facility]) june 2016   metastatic  . DDD (degenerative disc disease), cervical   . Edema leg    feet and ankles  . Esophagus disorder    Had esophagus stretched  . GERD (gastroesophageal reflux disease)    Benign stricture dilated in 2011  . Gout    Patient notes she has possible gout but has not been on chronic medications for this  . Headache(784.0)   . Hypertension   . Occipital neuralgia   . Occipital neuralgia    Related to cervical degenerative disc disease  . Peptic ulcer disease    Previous history in the remote past  . Ulcerative proctitis ([place])   . Ulcerative proctitis ([place]) 2014    SURGICAL HISTORY: Past Surgical History:  Procedure Laterality Date  . ABDOMINAL HYSTERECTOMY    . APPENDECTOMY    . CHOLECYSTECTOMY    . COLONOSCOPY WITH PROPOFOL  03/25/2012   Procedure: COLONOSCOPY WITH PROPOFOL;  Surgeon: Garlan Fair, MD;  Location: [facility];  Service: Endoscopy;  Laterality:  N/A;  . DILATION AND CURETTAGE OF UTERUS    . ESOPHAGEAL DILATION     For benign stricture in 2011  . ESOPHAGOGASTRODUODENOSCOPY    . FLEXIBLE SIGMOIDOSCOPY N/A 04/26/2014   Procedure: FLEXIBLE SIGMOIDOSCOPY - UnSedated;  Surgeon: Garlan Fair, MD;  Location: [facility];  Service: Endoscopy;  Laterality: N/A;  . FLEXIBLE SIGMOIDOSCOPY N/A 01/24/2015   Procedure: FLEXIBLE SIGNMOIDOSCOPY W/ FLEET ENEMIA;  Surgeon: Garlan Fair, MD;  Location: [facility];  Service: Endoscopy;  Laterality: N/A;  . FLEXIBLE SIGMOIDOSCOPY N/A 05/15/2016   Procedure: FLEXIBLE SIGMOIDOSCOPY;   Surgeon: Garlan Fair, MD;  Location: [facility];  Service: Endoscopy;  Laterality: N/A;  pt needs to ahve an enema upon arrival   . NECK SURGERY     20 years ago  . TOTAL ABDOMINAL HYSTERECTOMY W/ BILATERAL SALPINGOOPHORECTOMY     At age 77 due to miscarriage and retained products of conception causing significant uterine bleeding. Patient has been on estrogen replacement therapy since then.    SOCIAL HISTORY: Social History   Social History  . Marital status: Married    Spouse name: N/A  . Number of children: N/A  . Years of education: N/A   Occupational History  . Not on file.   Social History Main Topics  . Smoking status: Former Smoker    Quit date: 11/01/1983  . Smokeless tobacco: Never Used  . Alcohol use Yes     Comment: occ  . Drug use: No  . Sexual activity: No   Other Topics Concern  . Not on file   Social History Narrative  . No narrative on file  Retired as Conservation officer, nature for [employer]. She is very well spoken and intelligent individual and exhibits a keen knowledge of her medical conditions.  FAMILY HISTORY: Family History  Problem Relation Age of Onset  . Lung cancer Sister   . Prostate cancer Brother   . Uterine cancer Maternal Aunt   . Breast cancer Paternal Grandmother   . Hodgkin's lymphoma Sister     ALLERGIES:  is allergic to nitrofurantoin; indomethacin; and lisinopril.  MEDICATIONS:  Current Outpatient Prescriptions  Medication Sig Dispense Refill  . acetaminophen (TYLENOL) 650 MG CR tablet Take 1,300 mg by mouth 2 (two) times daily.    Marland Kitchen amLODipine (NORVASC) 5 MG tablet Take 5 mg by mouth daily.    . budesonide (ENTOCORT EC) 3 MG 24 hr capsule Take 1 capsule (3 mg total) by mouth daily. followup with your GI doctor to optimize further management. 30 capsule 1  . COLCRYS 0.6 MG tablet Take 0.6 mg by mouth once daily as needed for gout  1  . estradiol (ESTRACE) 0.5 MG tablet Take 0.5 mg by mouth daily.  1  .  guaiFENesin (MUCINEX) 600 MG 12 hr tablet Take 600 mg by mouth 2 (two) times daily.     Marland Kitchen Histamine Dihydrochloride (AUSTRALIAN DREAM ARTHRITIS EX) Apply 1 application topically 2 (two) times daily.    . mirtazapine (REMERON) 15 MG tablet Take 1 tablet (15 mg total) by mouth at bedtime. 90 tablet 0  . PRESCRIPTION MEDICATION Chemo card    . Respiratory Therapy Supplies (FLUTTER) DEVI 1 each by Does not apply route daily. 1 each 0  . senna-docusate (SENNA S) 8.6-50 MG per tablet Take 2 tablets by mouth at bedtime. (Patient taking differently: Take 1-2 tablets by mouth daily as needed for mild constipation. ) 60 tablet 1  . triamcinolone ointment (KENALOG) 0.5 %  Apply 1 application topically 2 (two) times daily. (Patient taking differently: Apply 1 application topically 2 (two) times daily as needed (rash). ) 30 g 0  . vitamin B-12 (CYANOCOBALAMIN) 100 MCG tablet Take 100 mcg by mouth daily.     No current facility-administered medications for this visit.    REVIEW OF SYSTEMS:    10 point review of systems done and is negative except as noted above  PHYSICAL EXAMINATION: ECOG PERFORMANCE STATUS: 1  Vitals:   12/06/16 1422  BP: 132/63  Pulse: 84  Resp: 18  Temp: 98.3 F (36.8 C)  SpO2: 97%   Filed Weights   12/06/16 1422  Weight: 124 lb 9.6 oz (56.5 kg)     GENERAL elderly Caucasian female, alert, no distress and comfortable SKIN: resolved skin rashes EYES: normal, conjunctiva are pink and non-injected, sclera clear OROPHARYNX:no exudate, no erythema and lips, buccal mucosa, and tongue normal  NECK: supple, thyroid normal size, non-tender, without nodularity LYMPH:  no palpable lymphadenopathy in the cervical, axillary or inguinal LUNGS: few bibasilar rales, scattered rhonci, good air entry. HEART: regular rate & rhythm and no murmurs and no lower extremity edema ABDOMEN:abdomen soft, non-tender and normal bowel sounds Musculoskeletal:no cyanosis of digits and no clubbing    PSYCH: alert & oriented x 3 with fluent speech NEURO: no focal motor/sensory deficits  LABORATORY DATA:  CBC Latest Ref Rng & Units 12/06/2016 11/15/2016 10/25/2016  WBC 3.9 - 10.3 10e3/uL 5.2 5.1 5.1  Hemoglobin 11.6 - 15.9 g/dL 9.6(L) 9.5(L) 9.8(L)  Hematocrit 34.8 - 46.6 % 30.8(L) 31.2(L) 31.8(L)  Platelets 145 - 400 10e3/uL 288 318 295   . CMP Latest Ref Rng & Units 12/06/2016 11/15/2016 10/25/2016  Glucose 70 - 140 mg/dl 102 91 83  BUN 7.0 - 26.0 mg/dL 17.5 16.4 17.5  Creatinine 0.6 - 1.1 mg/dL 1.0 1.1 1.1  Sodium 136 - 145 mEq/L 138 139 139  Potassium 3.5 - 5.1 mEq/L 4.6 4.3 4.4  Chloride 101 - 111 mmol/L - - -  CO2 22 - 29 mEq/L 24 24 24   Calcium 8.4 - 10.4 mg/dL 9.8 9.5 9.5  Total Protein 6.4 - 8.3 g/dL 7.9 7.5 7.7  Total Bilirubin 0.20 - 1.20 mg/dL 0.28 0.29 0.40  Alkaline Phos 40 - 150 U/L 41 41 37(L)  AST 5 - 34 U/L 14 16 19   ALT 0-55 U/L U/L <6 <6 8    Radiology .No results found.  ASSESSMENT & PLAN:   83 year old Caucasian female with  #1 Right Renal pelvis metastatic transitional cell carcinoma.   Initial PET scan revealed metastases to the lungs, liver, multiple nodal stations and bones. -PET scan done after 8 cycles of treatment for restaging disease showed significant response to treatment with no evidence of active disease. Patient has no clinical evidence of disease progression at this time. CT chest 06/16/2015 showed no evidence of cancer progression. PET/CT scan done on 10/21/2015- showed no findings for metastatic disease. New right-sided hydronephrosis versus local tumor recurrence needs to be evaluated further. MRI of the kidney showed hyperenhancing the right renal pelvis lesion consistent with concern for local recurrence. -PET/CT 03/06/2016 with no findings of metastatic disease. Extensive right-sided hydronephrosis with high activity from excreted FDG obscuring any findings along the collecting system. -PET/CT  06/13/2016- Interval increase in size of  right kidney. Findings are suspicious for progression of residual/recurrent urothelial carcinoma with further dilatation of the right renal collecting system. 2. No evidence for hypermetabolic metastases.  -PET/CT 10/22/2016 - Continued interval progression  of the abnormal soft tissue associated with the right kidney. This soft tissue remains markedly hypermetabolic and is compatible with continued progression of recurrent right renal disease. 2. No evidence for hypermetabolic metastases in the neck, chest, abdomen, or pelvis. -Has been previously seen by radiation oncology with consideration for local radiation therapy if she develops symptomatic disease - her local progression in the rt renal pelvis is asymptomatic at this time -She is still asymptomatic and responding well to treatment.    #2 h/o skin rash likely related to her Atezolizumab. Minimal grade 1 and topical triamcinolone. Currently quiescent.  #3 Cough and some clear secretions likely due to bronchiectasis . Followed by Dr Melvyn Novas. . No chest pain no increased shortness of breath or dyspnea on exertion  -Has an action plan with Dr. Melvyn Novas to use when necessary antibiotics for worsening symptoms. -Continue follow-up with Dr. 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 Wynetta Emery.  But stopped using it due to cost issues. Was then on PO budesonide which she has stopped as well. had recent sigmoidscopy with Dr Wynetta Emery - showed stable ulcerative proctitis  -will use po budesonide 3mg  po daily on as needed intermittent basis based on symptoms. Has not needed this for a few months at this time.   #5 history of hypertension   -on Amlodipine   Plan -Patient has no new treatment toxicities.  No clinical symptoms suggestive of overt disease recurrence/progression at this time. -labs stable -We'll continue her  Atezolizumab q3weeks. -Continue Xgeva every 6 weeks - will plan to space out or discontinue after completion of 2 years of treatment. -Prevnar shot today, flu vaccine when available and pneumococcal conjugate vaccine 2 months after prevnar vaccine.    RTC with Dr Irene Limbo in 3 weeks with labs Continue Alemtuzumab q3weeks Continue Xgeva q6 weeks   This document serves as a record of services personally performed by Sullivan Lone, MD. It was created on her behalf by Joslyn Devon, a trained medical scribe. The creation of this record is based on the scribe's personal observations and the provider's statements to them. This document has been checked and approved by the attending provider.    Sullivan Lone MD [facility] Physician [facility]  (Office):       [phone] (Work cell):  [phone] (Fax):           [phone]

## 2016-12-06 ENCOUNTER — Ambulatory Visit: Payer: Medicare Other

## 2016-12-06 ENCOUNTER — Encounter: Payer: Self-pay | Admitting: Hematology

## 2016-12-06 ENCOUNTER — Other Ambulatory Visit: Payer: Medicare Other

## 2016-12-06 ENCOUNTER — Ambulatory Visit (HOSPITAL_BASED_OUTPATIENT_CLINIC_OR_DEPARTMENT_OTHER): Payer: Medicare Other

## 2016-12-06 ENCOUNTER — Other Ambulatory Visit (HOSPITAL_BASED_OUTPATIENT_CLINIC_OR_DEPARTMENT_OTHER): Payer: Medicare Other

## 2016-12-06 ENCOUNTER — Ambulatory Visit (HOSPITAL_BASED_OUTPATIENT_CLINIC_OR_DEPARTMENT_OTHER): Payer: Medicare Other | Admitting: Hematology

## 2016-12-06 ENCOUNTER — Other Ambulatory Visit: Payer: Self-pay | Admitting: Hematology

## 2016-12-06 VITALS — BP 132/63 | HR 84 | Temp 98.3°F | Resp 18 | Ht 61.0 in | Wt 124.6 lb

## 2016-12-06 DIAGNOSIS — I10 Essential (primary) hypertension: Secondary | ICD-10-CM

## 2016-12-06 DIAGNOSIS — C7800 Secondary malignant neoplasm of unspecified lung: Secondary | ICD-10-CM

## 2016-12-06 DIAGNOSIS — Z23 Encounter for immunization: Secondary | ICD-10-CM

## 2016-12-06 DIAGNOSIS — C7951 Secondary malignant neoplasm of bone: Secondary | ICD-10-CM

## 2016-12-06 DIAGNOSIS — C787 Secondary malignant neoplasm of liver and intrahepatic bile duct: Secondary | ICD-10-CM

## 2016-12-06 DIAGNOSIS — R05 Cough: Secondary | ICD-10-CM

## 2016-12-06 DIAGNOSIS — C778 Secondary and unspecified malignant neoplasm of lymph nodes of multiple regions: Secondary | ICD-10-CM | POA: Diagnosis not present

## 2016-12-06 DIAGNOSIS — C659 Malignant neoplasm of unspecified renal pelvis: Secondary | ICD-10-CM

## 2016-12-06 DIAGNOSIS — Z5112 Encounter for antineoplastic immunotherapy: Secondary | ICD-10-CM | POA: Diagnosis not present

## 2016-12-06 DIAGNOSIS — Z95828 Presence of other vascular implants and grafts: Secondary | ICD-10-CM

## 2016-12-06 DIAGNOSIS — C651 Malignant neoplasm of right renal pelvis: Secondary | ICD-10-CM

## 2016-12-06 DIAGNOSIS — R21 Rash and other nonspecific skin eruption: Secondary | ICD-10-CM

## 2016-12-06 DIAGNOSIS — J439 Emphysema, unspecified: Secondary | ICD-10-CM

## 2016-12-06 DIAGNOSIS — J438 Other emphysema: Secondary | ICD-10-CM

## 2016-12-06 LAB — CBC & DIFF AND RETIC
BASO%: 0.2 % (ref 0.0–2.0)
BASOS ABS: 0 10*3/uL (ref 0.0–0.1)
EOS%: 0.8 % (ref 0.0–7.0)
Eosinophils Absolute: 0 10*3/uL (ref 0.0–0.5)
HEMATOCRIT: 30.8 % — AB (ref 34.8–46.6)
HEMOGLOBIN: 9.6 g/dL — AB (ref 11.6–15.9)
IMMATURE RETIC FRACT: 13.5 % — AB (ref 1.60–10.00)
LYMPH%: 42.2 % (ref 14.0–49.7)
MCH: 28 pg (ref 25.1–34.0)
MCHC: 31.2 g/dL — ABNORMAL LOW (ref 31.5–36.0)
MCV: 89.8 fL (ref 79.5–101.0)
MONO#: 0.7 10*3/uL (ref 0.1–0.9)
MONO%: 13.7 % (ref 0.0–14.0)
NEUT#: 2.2 10*3/uL (ref 1.5–6.5)
NEUT%: 43.1 % (ref 38.4–76.8)
Platelets: 288 10*3/uL (ref 145–400)
RBC: 3.43 10*6/uL — ABNORMAL LOW (ref 3.70–5.45)
RDW: 15.7 % — ABNORMAL HIGH (ref 11.2–14.5)
RETIC %: 1.39 % (ref 0.70–2.10)
RETIC CT ABS: 47.68 10*3/uL (ref 33.70–90.70)
WBC: 5.2 10*3/uL (ref 3.9–10.3)
lymph#: 2.2 10*3/uL (ref 0.9–3.3)
nRBC: 0 % (ref 0–0)

## 2016-12-06 LAB — COMPREHENSIVE METABOLIC PANEL
ALBUMIN: 3.2 g/dL — AB (ref 3.5–5.0)
ALK PHOS: 41 U/L (ref 40–150)
AST: 14 U/L (ref 5–34)
Anion Gap: 9 mEq/L (ref 3–11)
BUN: 17.5 mg/dL (ref 7.0–26.0)
CALCIUM: 9.8 mg/dL (ref 8.4–10.4)
CO2: 24 mEq/L (ref 22–29)
CREATININE: 1 mg/dL (ref 0.6–1.1)
Chloride: 105 mEq/L (ref 98–109)
EGFR: 49 mL/min/{1.73_m2} — ABNORMAL LOW (ref >90)
GLUCOSE: 102 mg/dL (ref 70–140)
Potassium: 4.6 mEq/L (ref 3.5–5.1)
Sodium: 138 mEq/L (ref 136–145)
TOTAL PROTEIN: 7.9 g/dL (ref 6.4–8.3)
Total Bilirubin: 0.28 mg/dL (ref 0.20–1.20)

## 2016-12-06 MED ORDER — SODIUM CHLORIDE 0.9 % IV SOLN
1200.0000 mg | Freq: Once | INTRAVENOUS | Status: AC
  Administered 2016-12-06: 1200 mg via INTRAVENOUS
  Filled 2016-12-06: qty 20

## 2016-12-06 MED ORDER — PNEUMOCOCCAL 13-VAL CONJ VACC IM SUSP
0.5000 mL | Freq: Once | INTRAMUSCULAR | Status: DC
  Filled 2016-12-06: qty 0.5

## 2016-12-06 MED ORDER — SODIUM CHLORIDE 0.9 % IV SOLN
Freq: Once | INTRAVENOUS | Status: AC
  Administered 2016-12-06: 16:00:00 via INTRAVENOUS

## 2016-12-06 MED ORDER — SODIUM CHLORIDE 0.9 % IJ SOLN
10.0000 mL | INTRAMUSCULAR | Status: DC | PRN
  Administered 2016-12-06: 10 mL via INTRAVENOUS
  Filled 2016-12-06: qty 10

## 2016-12-06 MED ORDER — DENOSUMAB 120 MG/1.7ML ~~LOC~~ SOLN
120.0000 mg | Freq: Once | SUBCUTANEOUS | Status: AC
  Administered 2016-12-06: 120 mg via SUBCUTANEOUS
  Filled 2016-12-06: qty 1.7

## 2016-12-06 MED ORDER — HEPARIN SOD (PORK) LOCK FLUSH 100 UNIT/ML IV SOLN
500.0000 [IU] | Freq: Once | INTRAVENOUS | Status: AC | PRN
  Administered 2016-12-06: 500 [IU]
  Filled 2016-12-06: qty 5

## 2016-12-06 MED ORDER — SODIUM CHLORIDE 0.9 % IJ SOLN
10.0000 mL | INTRAMUSCULAR | Status: DC | PRN
  Administered 2016-12-06: 10 mL
  Filled 2016-12-06: qty 10

## 2016-12-06 MED ORDER — PNEUMOCOCCAL 13-VAL CONJ VACC IM SUSP
0.5000 mL | Freq: Once | INTRAMUSCULAR | Status: AC
  Administered 2016-12-06: 0.5 mL via INTRAMUSCULAR
  Filled 2016-12-06: qty 0.5

## 2016-12-06 NOTE — Patient Instructions (Signed)
Thank you for choosing [facility] to provide your oncology and hematology care.  To afford each patient quality time with our providers, please arrive 30 minutes before your scheduled appointment time.  If you arrive late for your appointment, you may be asked to reschedule.  We strive to give you quality time with our providers, and arriving late affects you and other patients whose appointments are after yours.   If you are a no show for multiple scheduled visits, you may be dismissed from the clinic at the providers discretion.    Again, thank you for choosing [facility], our hope is that these requests will decrease the amount of time that you wait before being seen by our physicians.  ______________________________________________________________________  Should you have questions after your visit to the [facility], please contact our office at [phone] between the hours of 8:30 and 4:30 p.m.    Voicemails left after 4:30p.m will not be returned until the following business day.    For prescription refill requests, please have your pharmacy contact us directly.  Please also try to allow 48 hours for prescription requests.    Please contact the scheduling department for questions regarding scheduling.  For scheduling of procedures such as PET scans, CT scans, MRI, Ultrasound, etc please contact central scheduling at [phone].    Resources For Cancer Patients and Caregivers:   Oncolink.org:  A wonderful resource for patients and healthcare providers for information regarding your disease, ways to tract your treatment, what to expect, etc.     American Cancer Society:  800-227-2345  Can help patients locate various types of support and financial assistance  Cancer Care: 1-800-813-HOPE (4673) Provides financial assistance, online support groups, medication/co-pay assistance.    [place] DSS:  336-641-3447 Where to apply for food  stamps, Medicaid, and utility assistance  Medicare Rights Center: 800-333-4114 Helps people with Medicare understand their rights and benefits, navigate the Medicare system, and secure the quality healthcare they deserve  SCAT: 336-333-6589 [employer]'s shared-ride transportation service for eligible riders who have a disability that prevents them from riding the fixed route bus.    For additional information on assistance programs please contact our social worker:   Grier Hock/Abigail Elmore:  [phone]            

## 2016-12-06 NOTE — Patient Instructions (Addendum)
Atezolizumab injection What is this medicine? ATEZOLIZUMAB (a te zoe LIZ ue mab) is a monoclonal antibody. It is used to treat bladder cancer (urothelial cancer) and non-small cell lung cancer. This medicine may be used for other purposes; ask your health care provider or pharmacist if you have questions. COMMON BRAND NAME(S): Tecentriq What should I tell my health care provider before I take this medicine? They need to know if you have any of these conditions: -diabetes -immune system problems -infection -inflammatory bowel disease -liver disease -lung or breathing disease -lupus -nervous system problems like myasthenia gravis or Guillain-Barre syndrome -organ transplant -an unusual or allergic reaction to atezolizumab, other medicines, foods, dyes, or preservatives -pregnant or trying to get pregnant -breast-feeding How should I use this medicine? This medicine is for infusion into a vein. It is given by a health care professional in a hospital or clinic setting. A special MedGuide will be given to you before each treatment. Be sure to read this information carefully each time. Talk to your pediatrician regarding the use of this medicine in children. Special care may be needed. Overdosage: If you think you have taken too much of this medicine contact a poison control center or emergency room at once. NOTE: This medicine is only for you. Do not share this medicine with others. What if I miss a dose? It is important not to miss your dose. Call your doctor or health care professional if you are unable to keep an appointment. What may interact with this medicine? Interactions have not been studied. This list may not describe all possible interactions. Give your health care provider a list of all the medicines, herbs, non-prescription drugs, or dietary supplements you use. Also tell them if you smoke, drink alcohol, or use illegal drugs. Some items may interact with your medicine. What  should I watch for while using this medicine? Your condition will be monitored carefully while you are receiving this medicine. You may need blood work done while you are taking this medicine. Do not become pregnant while taking this medicine or for at least 5 months after stopping it. Women should inform their doctor if they wish to become pregnant or think they might be pregnant. There is a potential for serious side effects to an unborn child. Talk to your health care professional or pharmacist for more information. Do not breast-feed an infant while taking this medicine or for at least 5 months after the last dose. What side effects may I notice from receiving this medicine? Side effects that you should report to your doctor or health care professional as soon as possible: -allergic reactions like skin rash, itching or hives, swelling of the face, lips, or tongue -black, tarry stools -bloody or watery diarrhea -breathing problems -changes in vision -chest pain or chest tightness -chills -facial flushing -fever -headache -signs and symptoms of high blood sugar such as dizziness; dry mouth; dry skin; fruity breath; nausea; stomach pain; increased hunger or thirst; increased urination -signs and symptoms of liver injury like dark yellow or brown urine; general ill feeling or flu-like symptoms; light-colored stools; loss of appetite; nausea; right upper belly pain; unusually weak or tired; yellowing of the eyes or skin -stomach pain -trouble passing urine or change in the amount of urine Side effects that usually do not require medical attention (report to your doctor or health care professional if they continue or are bothersome): -cough -diarrhea -joint pain -muscle pain -muscle weakness -tiredness -weight loss This list may not describe all  possible side effects. Call your doctor for medical advice about side effects. You may report side effects to FDA at 1-800-FDA-1088. Where should  I keep my medicine? This drug is given in a hospital or clinic and will not be stored at home. NOTE: This sheet is a summary. It may not cover all possible information. If you have questions about this medicine, talk to your doctor, pharmacist, or health care provider.  2018 Elsevier/Gold Standard (2015-04-20 17:54:14) Pneumococcal Conjugate Vaccine suspension for injection What is this medicine? PNEUMOCOCCAL VACCINE (NEU mo KOK al vak SEEN) is a vaccine used to prevent pneumococcus bacterial infections. These bacteria can cause serious infections like pneumonia, meningitis, and blood infections. This vaccine will lower your chance of getting pneumonia. If you do get pneumonia, it can make your symptoms milder and your illness shorter. This vaccine will not treat an infection and will not cause infection. This vaccine is recommended for infants and young children, adults with certain medical conditions, and adults 33 years or older. This medicine may be used for other purposes; ask your health care provider or pharmacist if you have questions. COMMON BRAND NAME(S): Prevnar, Prevnar 13 What should I tell my health care provider before I take this medicine? They need to know if you have any of these conditions: -bleeding problems -fever -immune system problems -an unusual or allergic reaction to pneumococcal vaccine, diphtheria toxoid, other vaccines, latex, other medicines, foods, dyes, or preservatives -pregnant or trying to get pregnant -breast-feeding How should I use this medicine? This vaccine is for injection into a muscle. It is given by a health care professional. A copy of Vaccine Information Statements will be given before each vaccination. Read this sheet carefully each time. The sheet may change frequently. Talk to your pediatrician regarding the use of this medicine in children. While this drug may be prescribed for children as young as 3 weeks old for selected conditions,  precautions do apply. Overdosage: If you think you have taken too much of this medicine contact a poison control center or emergency room at once. NOTE: This medicine is only for you. Do not share this medicine with others. What if I miss a dose? It is important not to miss your dose. Call your doctor or health care professional if you are unable to keep an appointment. What may interact with this medicine? -medicines for cancer chemotherapy -medicines that suppress your immune function -steroid medicines like prednisone or cortisone This list may not describe all possible interactions. Give your health care provider a list of all the medicines, herbs, non-prescription drugs, or dietary supplements you use. Also tell them if you smoke, drink alcohol, or use illegal drugs. Some items may interact with your medicine. What should I watch for while using this medicine? Mild fever and pain should go away in 3 days or less. Report any unusual symptoms to your doctor or health care professional. What side effects may I notice from receiving this medicine? Side effects that you should report to your doctor or health care professional as soon as possible: -allergic reactions like skin rash, itching or hives, swelling of the face, lips, or tongue -breathing problems -confused -fast or irregular heartbeat -fever over 102 degrees F -seizures -unusual bleeding or bruising -unusual muscle weakness Side effects that usually do not require medical attention (report to your doctor or health care professional if they continue or are bothersome): -aches and pains -diarrhea -fever of 102 degrees F or less -headache -irritable -loss of appetite -pain,  tender at site where injected -trouble sleeping This list may not describe all possible side effects. Call your doctor for medical advice about side effects. You may report side effects to FDA at 1-800-FDA-1088. Where should I keep my medicine? This does  not apply. This vaccine is given in a clinic, pharmacy, doctor's office, or other health care setting and will not be stored at home. NOTE: This sheet is a summary. It may not cover all possible information. If you have questions about this medicine, talk to your doctor, pharmacist, or health care provider.  2018 Elsevier/Gold Standard (2013-12-24 10:27:27)

## 2016-12-07 ENCOUNTER — Telehealth: Payer: Self-pay | Admitting: Hematology

## 2016-12-07 NOTE — Telephone Encounter (Signed)
Called patient regarding 9/27 appointments

## 2016-12-26 NOTE — Progress Notes (Signed)
Hematology oncology clinic follow-up.  Date of service. 12/27/2016   Patient Care Team: [name], MD as PCP - General (Internal Medicine)   Urologist: Dr. 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  Diagnosis:  Widely metastatic transitional cell carcinoma from the right renal pelvis.  Treatment -Atezolizumab IV q3weeks  Alphonse Guild for bone mets  HISTORY OF PRESENTING ILLNESS: Please see my previous notes for details of initial presentation.  INTERVAL HISTORY Tiffany [name] is here with her husband for her scheduled follow-up and cycle 36 treatment. She has been doing well overall. She continues to have problems with cough.  She has been using a humidifier in her bedroom.  She denies any rectal bleeding. This morning she had several bowel movements. Energy level is unchanged. Denies fevers or chills. Denies abdominal pain.  She reports occasional little pops of skin rash on her arms but nothing intolerable.   She reports nocturia every 2 hours. She has no other concerns or complaints at this time.    MEDICAL HISTORY:  Past Medical History:  Diagnosis Date  . Arthritis     knees 03-10-12 had Cortisone injection  . Cancer [facility]) june 2016   metastatic  . DDD (degenerative disc disease), cervical   . Edema leg    feet and ankles  . Esophagus disorder    Had esophagus stretched  . GERD (gastroesophageal reflux disease)    Benign stricture dilated in 2011  . Gout    Patient notes she has possible gout but has not been on chronic medications for this  . Headache(784.0)   . Hypertension   . Occipital neuralgia   . Occipital neuralgia    Related to cervical degenerative disc disease  . Peptic ulcer disease    Previous history in the remote past  . Ulcerative proctitis ([place])   . Ulcerative proctitis ([place]) 2014    SURGICAL  HISTORY: Past Surgical History:  Procedure Laterality Date  . ABDOMINAL HYSTERECTOMY    . APPENDECTOMY    . CHOLECYSTECTOMY    . COLONOSCOPY WITH PROPOFOL  03/25/2012   Procedure: COLONOSCOPY WITH PROPOFOL;  Surgeon: Garlan Fair, MD;  Location: [facility];  Service: Endoscopy;  Laterality: N/A;  . DILATION AND CURETTAGE OF UTERUS    . ESOPHAGEAL DILATION     For benign stricture in 2011  . ESOPHAGOGASTRODUODENOSCOPY    . FLEXIBLE SIGMOIDOSCOPY N/A 04/26/2014   Procedure: FLEXIBLE SIGMOIDOSCOPY - UnSedated;  Surgeon: Garlan Fair, MD;  Location: [facility];  Service: Endoscopy;  Laterality: N/A;  . FLEXIBLE SIGMOIDOSCOPY N/A 01/24/2015   Procedure: FLEXIBLE SIGNMOIDOSCOPY W/ FLEET ENEMIA;  Surgeon: Garlan Fair, MD;  Location: [facility];  Service: Endoscopy;  Laterality: N/A;  . FLEXIBLE SIGMOIDOSCOPY N/A 05/15/2016   Procedure: FLEXIBLE SIGMOIDOSCOPY;  Surgeon: Garlan Fair, MD;  Location: [facility];  Service: Endoscopy;  Laterality: N/A;  pt needs to ahve an enema upon arrival   . NECK SURGERY     20 years ago  . TOTAL ABDOMINAL HYSTERECTOMY W/ BILATERAL SALPINGOOPHORECTOMY     At age 64 due to miscarriage and retained products of conception causing significant uterine bleeding. Patient has been on estrogen replacement therapy since then.    SOCIAL HISTORY: Social History   Social History  . Marital status: Married    Spouse name: N/A  . Number of children: N/A  . Years of  education: N/A   Occupational History  . Not on file.   Social History Main Topics  . Smoking status: Former Smoker    Quit date: 11/01/1983  . Smokeless tobacco: Never Used  . Alcohol use Yes     Comment: occ  . Drug use: No  . Sexual activity: No   Other Topics Concern  . Not on file   Social History Narrative  . No narrative on file  Retired as Conservation officer, nature for [employer]. She is very well spoken and intelligent individual and exhibits a keen knowledge  of her medical conditions.  FAMILY HISTORY: Family History  Problem Relation Age of Onset  . Lung cancer Sister   . Prostate cancer Brother   . Uterine cancer Maternal Aunt   . Breast cancer Paternal Grandmother   . Hodgkin's lymphoma Sister     ALLERGIES:  is allergic to nitrofurantoin; indomethacin; and lisinopril.  MEDICATIONS:  Current Outpatient Prescriptions  Medication Sig Dispense Refill  . acetaminophen (TYLENOL) 650 MG CR tablet Take 1,300 mg by mouth 2 (two) times daily.    Marland Kitchen amLODipine (NORVASC) 5 MG tablet Take 5 mg by mouth daily.    . budesonide (ENTOCORT EC) 3 MG 24 hr capsule Take 1 capsule (3 mg total) by mouth daily. followup with your GI doctor to optimize further management. 30 capsule 1  . COLCRYS 0.6 MG tablet Take 0.6 mg by mouth once daily as needed for gout  1  . estradiol (ESTRACE) 0.5 MG tablet Take 0.5 mg by mouth daily.  1  . guaiFENesin (MUCINEX) 600 MG 12 hr tablet Take 600 mg by mouth 2 (two) times daily.     Marland Kitchen Histamine Dihydrochloride (AUSTRALIAN DREAM ARTHRITIS EX) Apply 1 application topically 2 (two) times daily.    . mirtazapine (REMERON) 15 MG tablet Take 1 tablet (15 mg total) by mouth at bedtime. 90 tablet 0  . PRESCRIPTION MEDICATION Chemo card    . Respiratory Therapy Supplies (FLUTTER) DEVI 1 each by Does not apply route daily. 1 each 0  . senna-docusate (SENNA S) 8.6-50 MG per tablet Take 2 tablets by mouth at bedtime. (Patient taking differently: Take 1-2 tablets by mouth daily as needed for mild constipation. ) 60 tablet 1  . triamcinolone ointment (KENALOG) 0.5 % Apply 1 application topically 2 (two) times daily. (Patient taking differently: Apply 1 application topically 2 (two) times daily as needed (rash). ) 30 g 0  . vitamin B-12 (CYANOCOBALAMIN) 100 MCG tablet Take 100 mcg by mouth daily.     No current facility-administered medications for this visit.    REVIEW OF SYSTEMS:    10 point review of systems done and is negative  except as noted above  PHYSICAL EXAMINATION: ECOG PERFORMANCE STATUS: 1  Vitals:   12/27/16 1402  BP: 136/65  Pulse: 74  Resp: 17  Temp: 98.6 F (37 C)  SpO2: 96%   Filed Weights   12/27/16 1402  Weight: 124 lb 12.8 oz (56.6 kg)     GENERAL elderly Caucasian female, alert, no distress and comfortable SKIN: resolved skin rashes EYES: normal, conjunctiva are pink and non-injected, sclera clear OROPHARYNX:no exudate, no erythema and lips, buccal mucosa, and tongue normal  NECK: supple, thyroid normal size, non-tender, without nodularity LYMPH:  no palpable lymphadenopathy in the cervical, axillary or inguinal LUNGS: few bibasilar rales, intermittent scattered rhonci, good air entry. HEART: regular rate & rhythm and no murmurs and 1+ bilateral lower extremity edema ABDOMEN:abdomen soft,  non-tender and normal bowel sounds Musculoskeletal:no cyanosis of digits and no clubbing  PSYCH: alert & oriented x 3 with fluent speech NEURO: no focal motor/sensory deficits  LABORATORY DATA:  CBC Latest Ref Rng & Units 12/27/2016 12/06/2016 11/15/2016  WBC 3.9 - 10.3 10e3/uL 4.5 5.2 5.1  Hemoglobin 11.6 - 15.9 g/dL 9.9(L) 9.6(L) 9.5(L)  Hematocrit 34.8 - 46.6 % 30.7(L) 30.8(L) 31.2(L)  Platelets 145 - 400 10e3/uL 324 288 318   . CMP Latest Ref Rng & Units 12/27/2016 12/06/2016 11/15/2016  Glucose 70 - 140 mg/dl 120 102 91  BUN 7.0 - 26.0 mg/dL 17.2 17.5 16.4  Creatinine 0.6 - 1.1 mg/dL 1.0 1.0 1.1  Sodium 136 - 145 mEq/L 138 138 139  Potassium 3.5 - 5.1 mEq/L 4.2 4.6 4.3  Chloride 101 - 111 mmol/L - - -  CO2 22 - 29 mEq/L 25 24 24   Calcium 8.4 - 10.4 mg/dL 9.3 9.8 9.5  Total Protein 6.4 - 8.3 g/dL 7.8 7.9 7.5  Total Bilirubin 0.20 - 1.20 mg/dL 0.39 0.28 0.29  Alkaline Phos 40 - 150 U/L 43 41 41  AST 5 - 34 U/L 18 14 16   ALT 0-55 U/L U/L <6 <6 <6    Radiology .No results found.  ASSESSMENT & PLAN:   83 year old Caucasian female with  #1 Right Renal pelvis metastatic transitional  cell carcinoma.   Initial PET scan revealed metastases to the lungs, liver, multiple nodal stations and bones. -PET scan done after 8 cycles of treatment for restaging disease showed significant response to treatment with no evidence of active disease. Patient has no clinical evidence of disease progression at this time. CT chest 06/16/2015 showed no evidence of cancer progression. PET/CT scan done on 10/21/2015- showed no findings for metastatic disease. New right-sided hydronephrosis versus local tumor recurrence needs to be evaluated further. MRI of the kidney showed hyperenhancing the right renal pelvis lesion consistent with concern for local recurrence. -PET/CT 03/06/2016 with no findings of metastatic disease. Extensive right-sided hydronephrosis with high activity from excreted FDG obscuring any findings along the collecting system. -PET/CT  06/13/2016- Interval increase in size of right kidney. Findings are suspicious for progression of residual/recurrent urothelial carcinoma with further dilatation of the right renal collecting system. 2. No evidence for hypermetabolic metastases.  -PET/CT 10/22/2016 - Continued interval progression of the abnormal soft tissue associated with the right kidney. This soft tissue remains markedly hypermetabolic and is compatible with continued progression of recurrent right renal disease. 2. No evidence for hypermetabolic metastases in the neck, chest, abdomen, or pelvis. -Has been previously seen by radiation oncology with consideration for local radiation therapy if she develops symptomatic disease - her local progression in the rt renal pelvis is asymptomatic at this time -She is still asymptomatic and responding well to treatment.    #2 h/o skin rash likely related to her Atezolizumab. Minimal grade 1 and topical triamcinolone. Currently quiescent.  #3 Cough and some clear secretions likely due to bronchiectasis . Followed by Dr Melvyn Novas. . No chest pain no  increased shortness of breath or dyspnea on exertion  -Has an action plan with Dr. Melvyn Novas to use when necessary antibiotics for worsening symptoms. -Continue follow-up with Dr. 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 Wynetta Emery.  But stopped using it due to cost issues. Was then on PO budesonide which she has stopped as well. had recent  sigmoidscopy with Dr Wynetta Emery - showed stable ulcerative proctitis  -will use po budesonide 3mg  po daily on as needed intermittent basis based on symptoms. Has not needed this for a few months at this time.   #5 history of hypertension   -on Amlodipine   Plan -Patient has no new treatment toxicities.  No clinical symptoms suggestive of overt disease recurrence/progression at this time. -labs stable -We'll continue her Atezolizumab q3weeks. -Continue Xgeva every12 weeks - (since patient has completed 2 years of treatment. Will switch Xgeva to q12 weeks from q6 weeks.) -Plan to repeat scans in 4 months from the previous scans   RTC with Dr Irene Limbo in 3 weeks with labs Continue Alemtuzumab q3weeks Continue Xgeva q12 weeks   This document serves as a record of services personally performed by Sullivan Lone, MD. It was created on his behalf by Arlyce Harman, a trained medical scribe. The creation of this record is based on the scribe's personal observations and the provider's statements to them. This document has been checked and approved by the attending provider.   Sullivan Lone MD [place] H[facility]/Oncology Physician [facility]  (Office):       [phone] (Work cell):  [phone] (Fax):           [phone]

## 2016-12-27 ENCOUNTER — Other Ambulatory Visit (HOSPITAL_BASED_OUTPATIENT_CLINIC_OR_DEPARTMENT_OTHER): Payer: Medicare Other

## 2016-12-27 ENCOUNTER — Telehealth: Payer: Self-pay | Admitting: Hematology

## 2016-12-27 ENCOUNTER — Ambulatory Visit: Payer: Medicare Other

## 2016-12-27 ENCOUNTER — Ambulatory Visit (HOSPITAL_BASED_OUTPATIENT_CLINIC_OR_DEPARTMENT_OTHER): Payer: Medicare Other | Admitting: Hematology

## 2016-12-27 ENCOUNTER — Ambulatory Visit (HOSPITAL_BASED_OUTPATIENT_CLINIC_OR_DEPARTMENT_OTHER): Payer: Medicare Other

## 2016-12-27 ENCOUNTER — Other Ambulatory Visit: Payer: Medicare Other

## 2016-12-27 VITALS — BP 136/65 | HR 74 | Temp 98.6°F | Resp 17 | Ht 61.0 in | Wt 124.8 lb

## 2016-12-27 DIAGNOSIS — C7800 Secondary malignant neoplasm of unspecified lung: Secondary | ICD-10-CM | POA: Diagnosis not present

## 2016-12-27 DIAGNOSIS — C651 Malignant neoplasm of right renal pelvis: Secondary | ICD-10-CM

## 2016-12-27 DIAGNOSIS — C659 Malignant neoplasm of unspecified renal pelvis: Secondary | ICD-10-CM

## 2016-12-27 DIAGNOSIS — J219 Acute bronchiolitis, unspecified: Secondary | ICD-10-CM

## 2016-12-27 DIAGNOSIS — C7951 Secondary malignant neoplasm of bone: Secondary | ICD-10-CM | POA: Diagnosis not present

## 2016-12-27 DIAGNOSIS — C787 Secondary malignant neoplasm of liver and intrahepatic bile duct: Secondary | ICD-10-CM | POA: Diagnosis not present

## 2016-12-27 DIAGNOSIS — Z5112 Encounter for antineoplastic immunotherapy: Secondary | ICD-10-CM

## 2016-12-27 DIAGNOSIS — R05 Cough: Secondary | ICD-10-CM | POA: Diagnosis not present

## 2016-12-27 DIAGNOSIS — I10 Essential (primary) hypertension: Secondary | ICD-10-CM

## 2016-12-27 DIAGNOSIS — C778 Secondary and unspecified malignant neoplasm of lymph nodes of multiple regions: Secondary | ICD-10-CM

## 2016-12-27 LAB — COMPREHENSIVE METABOLIC PANEL
ALK PHOS: 43 U/L (ref 40–150)
ANION GAP: 8 meq/L (ref 3–11)
AST: 18 U/L (ref 5–34)
Albumin: 3.4 g/dL — ABNORMAL LOW (ref 3.5–5.0)
BILIRUBIN TOTAL: 0.39 mg/dL (ref 0.20–1.20)
BUN: 17.2 mg/dL (ref 7.0–26.0)
CALCIUM: 9.3 mg/dL (ref 8.4–10.4)
CHLORIDE: 105 meq/L (ref 98–109)
CO2: 25 mEq/L (ref 22–29)
CREATININE: 1 mg/dL (ref 0.6–1.1)
EGFR: 51 mL/min/{1.73_m2} — ABNORMAL LOW (ref >90)
Glucose: 120 mg/dl (ref 70–140)
Potassium: 4.2 mEq/L (ref 3.5–5.1)
Sodium: 138 mEq/L (ref 136–145)
Total Protein: 7.8 g/dL (ref 6.4–8.3)

## 2016-12-27 LAB — CBC & DIFF AND RETIC
BASO%: 0.6 % (ref 0.0–2.0)
Basophils Absolute: 0 10*3/uL (ref 0.0–0.1)
EOS%: 1.1 % (ref 0.0–7.0)
Eosinophils Absolute: 0 10*3/uL (ref 0.0–0.5)
HCT: 30.7 % — ABNORMAL LOW (ref 34.8–46.6)
HGB: 9.9 g/dL — ABNORMAL LOW (ref 11.6–15.9)
Immature Retic Fract: 12.4 % — ABNORMAL HIGH (ref 1.60–10.00)
LYMPH#: 2 10*3/uL (ref 0.9–3.3)
LYMPH%: 44 % (ref 14.0–49.7)
MCH: 27.9 pg (ref 25.1–34.0)
MCHC: 32.4 g/dL (ref 31.5–36.0)
MCV: 86.3 fL (ref 79.5–101.0)
MONO#: 0.6 10*3/uL (ref 0.1–0.9)
MONO%: 12.6 % (ref 0.0–14.0)
NEUT%: 41.7 % (ref 38.4–76.8)
NEUTROS ABS: 1.9 10*3/uL (ref 1.5–6.5)
PLATELETS: 324 10*3/uL (ref 145–400)
RBC: 3.55 10*6/uL — AB (ref 3.70–5.45)
RDW: 17.1 % — AB (ref 11.2–14.5)
RETIC %: 1.66 % (ref 0.70–2.10)
RETIC CT ABS: 58.93 10*3/uL (ref 33.70–90.70)
WBC: 4.5 10*3/uL (ref 3.9–10.3)

## 2016-12-27 MED ORDER — SODIUM CHLORIDE 0.9 % IJ SOLN
10.0000 mL | INTRAMUSCULAR | Status: DC | PRN
  Administered 2016-12-27: 10 mL
  Filled 2016-12-27: qty 10

## 2016-12-27 MED ORDER — SODIUM CHLORIDE 0.9 % IV SOLN
Freq: Once | INTRAVENOUS | Status: AC
  Administered 2016-12-27: 15:00:00 via INTRAVENOUS

## 2016-12-27 MED ORDER — HEPARIN SOD (PORK) LOCK FLUSH 100 UNIT/ML IV SOLN
500.0000 [IU] | Freq: Once | INTRAVENOUS | Status: AC | PRN
Start: 2016-12-27 — End: 2016-12-27
  Administered 2016-12-27: 500 [IU]
  Filled 2016-12-27: qty 5

## 2016-12-27 MED ORDER — SODIUM CHLORIDE 0.9 % IV SOLN
1200.0000 mg | Freq: Once | INTRAVENOUS | Status: AC
  Administered 2016-12-27: 1200 mg via INTRAVENOUS
  Filled 2016-12-27: qty 20

## 2016-12-27 NOTE — Patient Instructions (Signed)
[facility] Discharge Instructions for Patients Receiving Chemotherapy  Today you received the following chemotherapy agents: Tecentriq  To help prevent nausea and vomiting after your treatment, we encourage you to take your nausea medication as directed.   If you develop nausea and vomiting that is not controlled by your nausea medication, call the clinic.   BELOW ARE SYMPTOMS THAT SHOULD BE REPORTED IMMEDIATELY:  *FEVER GREATER THAN 100.5 F  *CHILLS WITH OR WITHOUT FEVER  NAUSEA AND VOMITING THAT IS NOT CONTROLLED WITH YOUR NAUSEA MEDICATION  *UNUSUAL SHORTNESS OF BREATH  *UNUSUAL BRUISING OR BLEEDING  TENDERNESS IN MOUTH AND THROAT WITH OR WITHOUT PRESENCE OF ULCERS  *URINARY PROBLEMS  *BOWEL PROBLEMS  UNUSUAL RASH Items with * indicate a potential emergency and should be followed up as soon as possible.  Feel free to call the clinic should you have any questions or concerns. The clinic phone number is [phone].  Please show the CHEMO ALERT CARD at check-in to the Emergency Department and triage nurse.   

## 2016-12-28 ENCOUNTER — Telehealth: Payer: Self-pay

## 2016-12-28 ENCOUNTER — Telehealth: Payer: Self-pay | Admitting: Hematology

## 2016-12-28 MED ORDER — TRIAMCINOLONE ACETONIDE 0.5 % EX OINT: 1.0000 "application " | TOPICAL_OINTMENT | Freq: Two times a day (BID) | CUTANEOUS | 0 refills | Status: DC

## 2016-12-28 MED ORDER — MIRTAZAPINE 15 MG PO TABS: 15.0000 mg | ORAL_TABLET | Freq: Every day | ORAL | 1 refills | Status: DC

## 2016-12-28 NOTE — Addendum Note (Signed)
Addended by: Sullivan Lone on: 12/28/2016 02:56 PM   Modules accepted: Orders

## 2016-12-28 NOTE — Telephone Encounter (Signed)
Called pt that rx was sent to pharmacy. They have already picked them up.

## 2016-12-28 NOTE — Telephone Encounter (Signed)
Spoke with patient regarding her appts. She had a question regarding her prescriptions. I am contacting Dr.Kales nurse regarding this.

## 2017-01-17 ENCOUNTER — Telehealth: Payer: Self-pay | Admitting: Hematology

## 2017-01-17 ENCOUNTER — Ambulatory Visit (HOSPITAL_BASED_OUTPATIENT_CLINIC_OR_DEPARTMENT_OTHER): Payer: Medicare Other | Admitting: Hematology

## 2017-01-17 ENCOUNTER — Other Ambulatory Visit (HOSPITAL_BASED_OUTPATIENT_CLINIC_OR_DEPARTMENT_OTHER): Payer: Medicare Other

## 2017-01-17 ENCOUNTER — Ambulatory Visit (HOSPITAL_BASED_OUTPATIENT_CLINIC_OR_DEPARTMENT_OTHER): Payer: Medicare Other

## 2017-01-17 ENCOUNTER — Ambulatory Visit: Payer: Medicare Other

## 2017-01-17 VITALS — BP 152/70 | HR 79 | Temp 97.9°F | Resp 17 | Ht 61.0 in | Wt 125.6 lb

## 2017-01-17 DIAGNOSIS — C787 Secondary malignant neoplasm of liver and intrahepatic bile duct: Secondary | ICD-10-CM

## 2017-01-17 DIAGNOSIS — C651 Malignant neoplasm of right renal pelvis: Secondary | ICD-10-CM | POA: Diagnosis not present

## 2017-01-17 DIAGNOSIS — C659 Malignant neoplasm of unspecified renal pelvis: Secondary | ICD-10-CM

## 2017-01-17 DIAGNOSIS — Z23 Encounter for immunization: Secondary | ICD-10-CM

## 2017-01-17 DIAGNOSIS — R21 Rash and other nonspecific skin eruption: Secondary | ICD-10-CM

## 2017-01-17 DIAGNOSIS — C7951 Secondary malignant neoplasm of bone: Secondary | ICD-10-CM

## 2017-01-17 DIAGNOSIS — Z95828 Presence of other vascular implants and grafts: Secondary | ICD-10-CM

## 2017-01-17 DIAGNOSIS — Z79899 Other long term (current) drug therapy: Secondary | ICD-10-CM

## 2017-01-17 DIAGNOSIS — Z5112 Encounter for antineoplastic immunotherapy: Secondary | ICD-10-CM

## 2017-01-17 DIAGNOSIS — C7800 Secondary malignant neoplasm of unspecified lung: Secondary | ICD-10-CM

## 2017-01-17 LAB — CBC & DIFF AND RETIC
BASO%: 0.2 % (ref 0.0–2.0)
BASOS ABS: 0 10*3/uL (ref 0.0–0.1)
EOS%: 1 % (ref 0.0–7.0)
Eosinophils Absolute: 0 10*3/uL (ref 0.0–0.5)
HCT: 31.3 % — ABNORMAL LOW (ref 34.8–46.6)
HGB: 9.6 g/dL — ABNORMAL LOW (ref 11.6–15.9)
IMMATURE RETIC FRACT: 11.4 % — AB (ref 1.60–10.00)
LYMPH%: 42.1 % (ref 14.0–49.7)
MCH: 27.7 pg (ref 25.1–34.0)
MCHC: 30.7 g/dL — AB (ref 31.5–36.0)
MCV: 90.5 fL (ref 79.5–101.0)
MONO#: 0.5 10*3/uL (ref 0.1–0.9)
MONO%: 13.1 % (ref 0.0–14.0)
NEUT%: 43.6 % (ref 38.4–76.8)
NEUTROS ABS: 1.8 10*3/uL (ref 1.5–6.5)
PLATELETS: 265 10*3/uL (ref 145–400)
RBC: 3.46 10*6/uL — AB (ref 3.70–5.45)
RDW: 16.9 % — ABNORMAL HIGH (ref 11.2–14.5)
Retic %: 1.43 % (ref 0.70–2.10)
Retic Ct Abs: 49.48 10*3/uL (ref 33.70–90.70)
WBC: 4.1 10*3/uL (ref 3.9–10.3)
lymph#: 1.7 10*3/uL (ref 0.9–3.3)

## 2017-01-17 LAB — COMPREHENSIVE METABOLIC PANEL
ALT: 6 U/L (ref 0–55)
ANION GAP: 8 meq/L (ref 3–11)
AST: 18 U/L (ref 5–34)
Albumin: 3.4 g/dL — ABNORMAL LOW (ref 3.5–5.0)
Alkaline Phosphatase: 40 U/L (ref 40–150)
BILIRUBIN TOTAL: 0.35 mg/dL (ref 0.20–1.20)
BUN: 13 mg/dL (ref 7.0–26.0)
CHLORIDE: 109 meq/L (ref 98–109)
CO2: 22 meq/L (ref 22–29)
Calcium: 9 mg/dL (ref 8.4–10.4)
Creatinine: 1.1 mg/dL (ref 0.6–1.1)
EGFR: 43 mL/min/{1.73_m2} — AB (ref >60)
GLUCOSE: 112 mg/dL (ref 70–140)
Potassium: 3.7 mEq/L (ref 3.5–5.1)
SODIUM: 140 meq/L (ref 136–145)
TOTAL PROTEIN: 7.6 g/dL (ref 6.4–8.3)

## 2017-01-17 LAB — TSH: TSH: 0.762 m[IU]/L (ref 0.308–3.960)

## 2017-01-17 MED ORDER — SODIUM CHLORIDE 0.9 % IV SOLN
Freq: Once | INTRAVENOUS | Status: AC
  Administered 2017-01-17: 15:00:00 via INTRAVENOUS

## 2017-01-17 MED ORDER — INFLUENZA VAC SPLIT HIGH-DOSE 0.5 ML IM SUSY
0.5000 mL | PREFILLED_SYRINGE | INTRAMUSCULAR | Status: AC
  Administered 2017-01-17: 0.5 mL via INTRAMUSCULAR
  Filled 2017-01-17: qty 0.5

## 2017-01-17 MED ORDER — BENZONATATE 100 MG PO CAPS: 100.0000 mg | ORAL_CAPSULE | Freq: Three times a day (TID) | ORAL | 0 refills | Status: DC | PRN

## 2017-01-17 MED ORDER — SODIUM CHLORIDE 0.9 % IV SOLN
1200.0000 mg | Freq: Once | INTRAVENOUS | Status: AC
  Administered 2017-01-17: 1200 mg via INTRAVENOUS
  Filled 2017-01-17: qty 20

## 2017-01-17 MED ORDER — SODIUM CHLORIDE 0.9 % IJ SOLN
10.0000 mL | INTRAMUSCULAR | Status: DC | PRN
  Administered 2017-01-17: 10 mL via INTRAVENOUS
  Filled 2017-01-17: qty 10

## 2017-01-17 MED ORDER — SODIUM CHLORIDE 0.9 % IJ SOLN
10.0000 mL | INTRAMUSCULAR | Status: DC | PRN
  Administered 2017-01-17: 10 mL
  Filled 2017-01-17: qty 10

## 2017-01-17 MED ORDER — HEPARIN SOD (PORK) LOCK FLUSH 100 UNIT/ML IV SOLN
500.0000 [IU] | Freq: Once | INTRAVENOUS | Status: AC | PRN
  Administered 2017-01-17: 500 [IU]
  Filled 2017-01-17: qty 5

## 2017-01-17 MED ORDER — INFLUENZA VAC SPLIT HIGH-DOSE 0.5 ML IM SUSY
0.5000 mL | PREFILLED_SYRINGE | Freq: Once | INTRAMUSCULAR | Status: DC
  Filled 2017-01-17: qty 0.5

## 2017-01-17 NOTE — Telephone Encounter (Signed)
Scheduled apt per 10/08 los - Gave patient AVS and calender per los.

## 2017-01-17 NOTE — Patient Instructions (Signed)
[facility] Discharge Instructions for Patients Receiving Chemotherapy  Today you received the following chemotherapy agents: Tecentriq  To help prevent nausea and vomiting after your treatment, we encourage you to take your nausea medication as directed.   If you develop nausea and vomiting that is not controlled by your nausea medication, call the clinic.   BELOW ARE SYMPTOMS THAT SHOULD BE REPORTED IMMEDIATELY:  *FEVER GREATER THAN 100.5 F  *CHILLS WITH OR WITHOUT FEVER  NAUSEA AND VOMITING THAT IS NOT CONTROLLED WITH YOUR NAUSEA MEDICATION  *UNUSUAL SHORTNESS OF BREATH  *UNUSUAL BRUISING OR BLEEDING  TENDERNESS IN MOUTH AND THROAT WITH OR WITHOUT PRESENCE OF ULCERS  *URINARY PROBLEMS  *BOWEL PROBLEMS  UNUSUAL RASH Items with * indicate a potential emergency and should be followed up as soon as possible.  Feel free to call the clinic should you have any questions or concerns. The clinic phone number is [phone].  Please show the CHEMO ALERT CARD at check-in to the Emergency Department and triage nurse.   

## 2017-01-17 NOTE — Progress Notes (Signed)
Hematology oncology clinic follow-up.  Date of service. 01/17/2017   Patient Care Team: [name], MD as PCP - General (Internal Medicine)   Urologist: Dr. 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  Diagnosis:  Widely metastatic transitional cell carcinoma from the right renal pelvis.  Treatment -Atezolizumab IV q3weeks  Alphonse Guild for bone mets  HISTORY OF PRESENTING ILLNESS: Please see my previous notes for details of initial presentation.  INTERVAL HISTORY [name] is here with her husband for her scheduled follow-up and cycle 37 treatment. She has been doing very well overall. No new acute changes. She continues to have a mildly productive cough, but this has not been keeping her awake at night. She also notes that she has been getting fatigued with activities recently, but nothing unusual from her baseline. She denies diarrhea, rectal bleeding, abdominal pain, fever, chills, rash, flank pain, hematuria, difficulty urinating, or any other associated symptoms. She is asking for an influenza vaccination while in clinic today.    MEDICAL HISTORY:  Past Medical History:  Diagnosis Date  . Arthritis     knees 03-10-12 had Cortisone injection  . Cancer [facility]) june 2016   metastatic  . DDD (degenerative disc disease), cervical   . Edema leg    feet and ankles  . Esophagus disorder    Had esophagus stretched  . GERD (gastroesophageal reflux disease)    Benign stricture dilated in 2011  . Gout    Patient notes she has possible gout but has not been on chronic medications for this  . Headache(784.0)   . Hypertension   . Occipital neuralgia   . Occipital neuralgia    Related to cervical degenerative disc disease  . Peptic ulcer disease    Previous history in the remote past  . Ulcerative proctitis ([place])   . Ulcerative  proctitis ([place]) 2014    SURGICAL HISTORY: Past Surgical History:  Procedure Laterality Date  . ABDOMINAL HYSTERECTOMY    . APPENDECTOMY    . CHOLECYSTECTOMY    . COLONOSCOPY WITH PROPOFOL  03/25/2012   Procedure: COLONOSCOPY WITH PROPOFOL;  Surgeon: Garlan Fair, MD;  Location: [facility];  Service: Endoscopy;  Laterality: N/A;  . DILATION AND CURETTAGE OF UTERUS    . ESOPHAGEAL DILATION     For benign stricture in 2011  . ESOPHAGOGASTRODUODENOSCOPY    . FLEXIBLE SIGMOIDOSCOPY N/A 04/26/2014   Procedure: FLEXIBLE SIGMOIDOSCOPY - UnSedated;  Surgeon: Garlan Fair, MD;  Location: [facility];  Service: Endoscopy;  Laterality: N/A;  . FLEXIBLE SIGMOIDOSCOPY N/A 01/24/2015   Procedure: FLEXIBLE SIGNMOIDOSCOPY W/ FLEET ENEMIA;  Surgeon: Garlan Fair, MD;  Location: [facility];  Service: Endoscopy;  Laterality: N/A;  . FLEXIBLE SIGMOIDOSCOPY N/A 05/15/2016   Procedure: FLEXIBLE SIGMOIDOSCOPY;  Surgeon: Garlan Fair, MD;  Location: [facility];  Service: Endoscopy;  Laterality: N/A;  pt needs to ahve an enema upon arrival   . NECK SURGERY     20 years ago  . TOTAL ABDOMINAL HYSTERECTOMY W/ BILATERAL SALPINGOOPHORECTOMY     At age 67 due to miscarriage and retained products of conception causing significant uterine bleeding. Patient has been on estrogen replacement therapy since then.    SOCIAL HISTORY: Social History   Social History  . Marital status: Married    Spouse name: N/A  . Number of children: N/A  . Years of education: N/A  Occupational History  . Not on file.   Social History Main Topics  . Smoking status: Former Smoker    Quit date: 11/01/1983  . Smokeless tobacco: Never Used  . Alcohol use Yes     Comment: occ  . Drug use: No  . Sexual activity: No   Other Topics Concern  . Not on file   Social History Narrative  . No narrative on file  Retired as Conservation officer, nature for [employer]. She is very well spoken and intelligent  individual and exhibits a keen knowledge of her medical conditions.  FAMILY HISTORY: Family History  Problem Relation Age of Onset  . Lung cancer Sister   . Prostate cancer Brother   . Uterine cancer Maternal Aunt   . Breast cancer Paternal Grandmother   . Hodgkin's lymphoma Sister     ALLERGIES:  is allergic to nitrofurantoin; indomethacin; and lisinopril.  MEDICATIONS:  Current Outpatient Prescriptions  Medication Sig Dispense Refill  . acetaminophen (TYLENOL) 650 MG CR tablet Take 1,300 mg by mouth 2 (two) times daily.    Marland Kitchen amLODipine (NORVASC) 5 MG tablet Take 5 mg by mouth daily.    . budesonide (ENTOCORT EC) 3 MG 24 hr capsule Take 1 capsule (3 mg total) by mouth daily. followup with your GI doctor to optimize further management. 30 capsule 1  . COLCRYS 0.6 MG tablet Take 0.6 mg by mouth once daily as needed for gout  1  . estradiol (ESTRACE) 0.5 MG tablet Take 0.5 mg by mouth daily.  1  . guaiFENesin (MUCINEX) 600 MG 12 hr tablet Take 600 mg by mouth 2 (two) times daily.     Marland Kitchen Histamine Dihydrochloride (AUSTRALIAN DREAM ARTHRITIS EX) Apply 1 application topically 2 (two) times daily.    . mirtazapine (REMERON) 15 MG tablet Take 1 tablet (15 mg total) by mouth at bedtime. 90 tablet 1  . PRESCRIPTION MEDICATION Chemo card    . Respiratory Therapy Supplies (FLUTTER) DEVI 1 each by Does not apply route daily. 1 each 0  . senna-docusate (SENNA S) 8.6-50 MG per tablet Take 2 tablets by mouth at bedtime. (Patient taking differently: Take 1-2 tablets by mouth daily as needed for mild constipation. ) 60 tablet 1  . triamcinolone ointment (KENALOG) 0.5 % Apply 1 application topically 2 (two) times daily. 30 g 0  . vitamin B-12 (CYANOCOBALAMIN) 100 MCG tablet Take 100 mcg by mouth daily.     No current facility-administered medications for this visit.    Facility-Administered Medications Ordered in Other Visits  Medication Dose Route Frequency Provider Last Rate Last Dose  . sodium  chloride 0.9 % injection 10 mL  10 mL Intravenous PRN [name], MD   10 mL at 01/17/17 1345   REVIEW OF SYSTEMS:    10 point review of systems done and is negative except as noted above  PHYSICAL EXAMINATION: ECOG PERFORMANCE STATUS: 1  Vitals:   01/17/17 1408  BP: (!) 152/70  Pulse: 79  Resp: 17  Temp: 97.9 F (36.6 C)  SpO2: 99%   Filed Weights   01/17/17 1408  Weight: 125 lb 9.6 oz (57 kg)     GENERAL elderly Caucasian female, alert, no distress and comfortable SKIN: resolved skin rashes EYES: normal, conjunctiva are pink and non-injected, sclera clear OROPHARYNX:no exudate, no erythema and lips, buccal mucosa, and tongue normal  NECK: supple, thyroid normal size, non-tender, without nodularity LYMPH:  no palpable lymphadenopathy in the cervical, axillary or inguinal LUNGS:  few bibasilar rales, intermittent scattered rhonci, good air entry. HEART: regular rate & rhythm and no murmurs and 1+ bilateral lower extremity edema ABDOMEN:abdomen soft, non-tender and normal bowel sounds Musculoskeletal:no cyanosis of digits and no clubbing  PSYCH: alert & oriented x 3 with fluent speech NEURO: no focal motor/sensory deficits  LABORATORY DATA:  CBC Latest Ref Rng & Units 01/17/2017 12/27/2016 12/06/2016  WBC 3.9 - 10.3 10e3/uL 4.1 4.5 5.2  Hemoglobin 11.6 - 15.9 g/dL 9.6(L) 9.9(L) 9.6(L)  Hematocrit 34.8 - 46.6 % 31.3(L) 30.7(L) 30.8(L)  Platelets 145 - 400 10e3/uL 265 324 288   . CMP Latest Ref Rng & Units 01/17/2017 12/27/2016 12/06/2016  Glucose 70 - 140 mg/dl 112 120 102  BUN 7.0 - 26.0 mg/dL 13.0 17.2 17.5  Creatinine 0.6 - 1.1 mg/dL 1.1 1.0 1.0  Sodium 136 - 145 mEq/L 140 138 138  Potassium 3.5 - 5.1 mEq/L 3.7 4.2 4.6  Chloride 101 - 111 mmol/L - - -  CO2 22 - 29 mEq/L 22 25 24   Calcium 8.4 - 10.4 mg/dL 9.0 9.3 9.8  Total Protein 6.4 - 8.3 g/dL 7.6 7.8 7.9  Total Bilirubin 0.20 - 1.20 mg/dL 0.35 0.39 0.28  Alkaline Phos 40 - 150 U/L 40 43 41  AST 5 - 34  U/L 18 18 14   ALT 0 - 55 U/L 6 <6 <6    Radiology .No results found.  ASSESSMENT & PLAN:   83 year old Caucasian female with  #1 Right Renal pelvis metastatic transitional cell carcinoma.   Initial PET scan revealed metastases to the lungs, liver, multiple nodal stations and bones. -PET scan done after 8 cycles of treatment for restaging disease showed significant response to treatment with no evidence of active disease. Patient has no clinical evidence of disease progression at this time. CT chest 06/16/2015 showed no evidence of cancer progression. PET/CT scan done on 10/21/2015- showed no findings for metastatic disease. New right-sided hydronephrosis versus local tumor recurrence needs to be evaluated further. MRI of the kidney showed hyperenhancing the right renal pelvis lesion consistent with concern for local recurrence. -PET/CT 03/06/2016 with no findings of metastatic disease. Extensive right-sided hydronephrosis with high activity from excreted FDG obscuring any findings along the collecting system. -PET/CT  06/13/2016- Interval increase in size of right kidney. Findings are suspicious for progression of residual/recurrent urothelial carcinoma with further dilatation of the right renal collecting system. 2. No evidence for hypermetabolic metastases.  -PET/CT 10/22/2016 - Continued interval progression of the abnormal soft tissue associated with the right kidney. This soft tissue remains markedly hypermetabolic and is compatible with continued progression of recurrent right renal disease. 2. No evidence for hypermetabolic metastases in the neck, chest, abdomen, or pelvis. -Has been previously seen by radiation oncology with consideration for local radiation therapy if she develops symptomatic disease - her local progression in the rt renal pelvis is asymptomatic at this time -She is still asymptomatic and responding well to treatment.   #2 h/o skin rash likely related to her  Atezolizumab. Minimal grade 1 and topical triamcinolone. Currently quiescent.  #3 Cough and some clear secretions likely due to bronchiectasis. Followed by Dr Melvyn Novas. -No chest pain no increased shortness of breath or dyspnea on exertion  -Has an action plan with Dr. Melvyn Novas to use when necessary antibiotics for worsening symptoms. -Continue follow-up with Dr. 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 Wynetta Emery.  But stopped using it due to cost issues. Was then on PO budesonide which she has stopped as well. had recent sigmoidscopy with Dr Wynetta Emery - showed stable ulcerative proctitis  -will use po budesonide 3mg  po daily on as needed intermittent basis based on symptoms. Has not needed this for a few months at this time.   #5 history of hypertension   -on Amlodipine   Plan -Patient has no new treatment toxicities. No clinical symptoms suggestive of overt disease recurrence/progression at this time. -labs stable -We'll continue her Atezolizumab q3weeks. -Continue Xgeva every12 weeks - (since patient has completed 2 years of treatment. Will switch Xgeva to q12 weeks from q6 weeks.) -Plan to repeat PET/CT after next visit.  Flu shot today RTC with Dr Irene Limbo in 3 weeks with labs Continue Atezolizumab q3weeks Continue Delton See q12 weeks   Sullivan Lone MD [place] [facility] Phys[facility]  (Office):       [phone] (Work cell):  [phone] (Fax):           [phone]  This document serves as a record of services personally performed by Sullivan Lone, MD. It was created on his behalf by Reola Mosher, a trained medical scribe. The creation of this record is based on the scribe's personal observations and the provider's statements to them. This document has  been checked and approved by the attending provider.

## 2017-01-29 IMAGING — CT CT CHEST W/O CM
1 of 2 series · 14 of 30 positions shown, 18 images · non-contrast
Comparison: 04/27/2015 PET-CT.

CLINICAL DATA: 89-year-old female with a history of metastatic
transitional cell carcinoma of the right renal pelvis treated with
Atezolizumab presents with persistent cough.

EXAM:
CT CHEST WITHOUT CONTRAST
TECHNIQUE: Multidetector CT imaging of the chest was performed following the
standard protocol without IV contrast.

[Series 2: rtn chest without st · axial · non-contrast · 0.74mm/px · z∈[-312,-66]mm · 14 of 59 slices shown, 18 images]
[im 5/59  mediastinal]
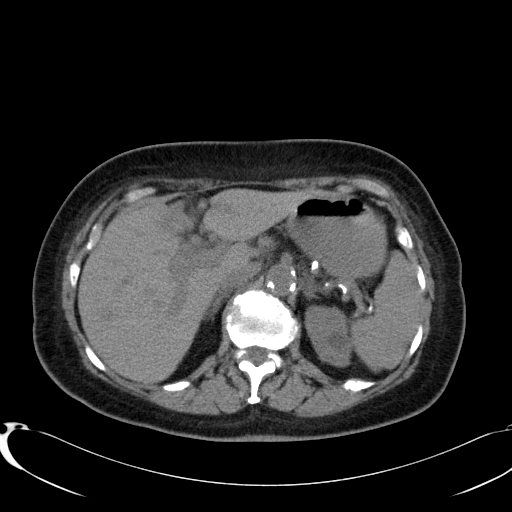
[im 5/59  lung]
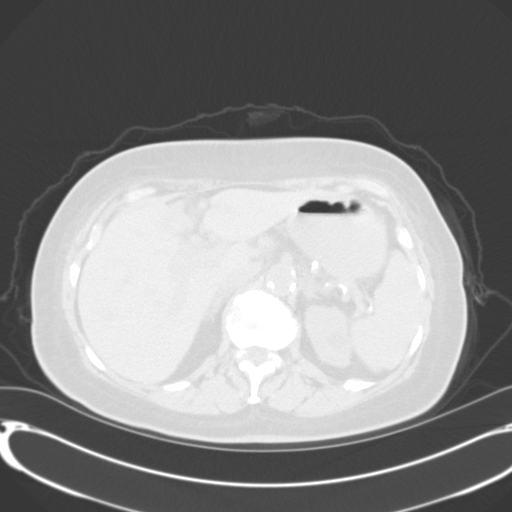
[im 9/59  lung]
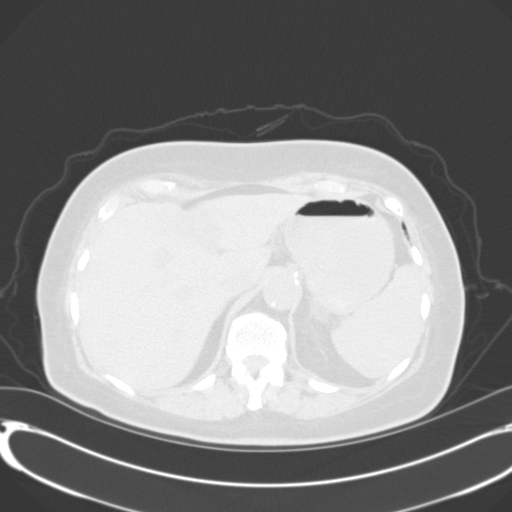
[im 13/59  lung]
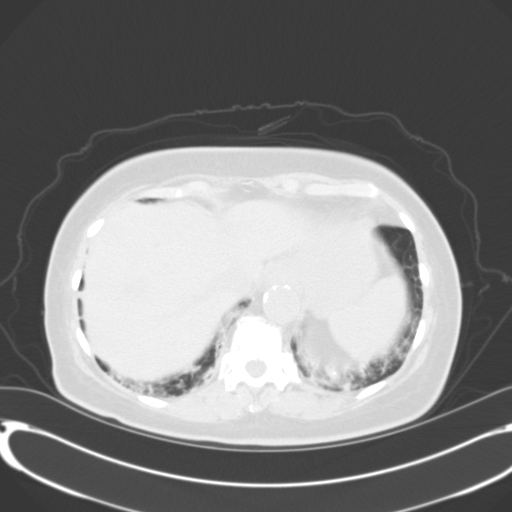
[im 17/59  lung]
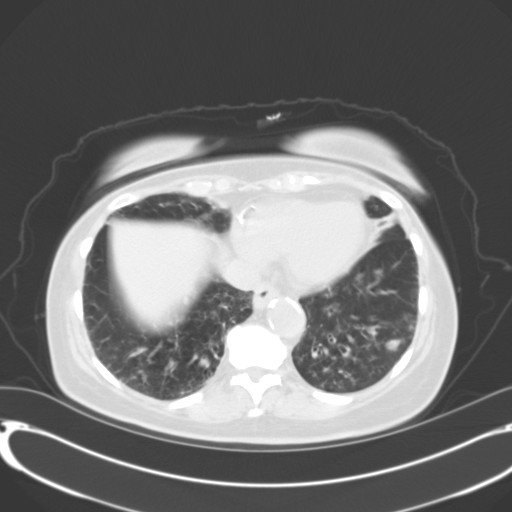
[im 21/59  mediastinal]
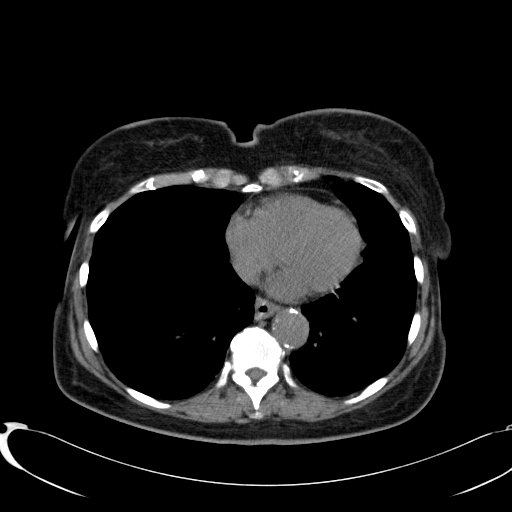
[im 21/59  lung]
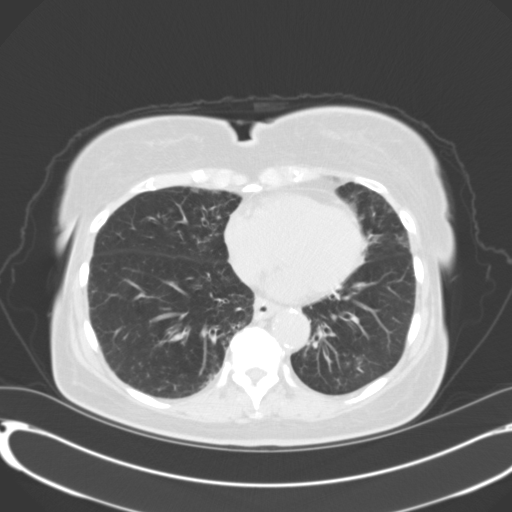
[im 25/59  lung]
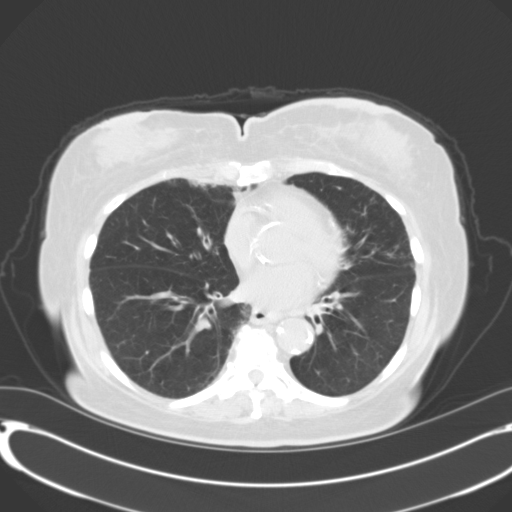
[im 28/59  lung]
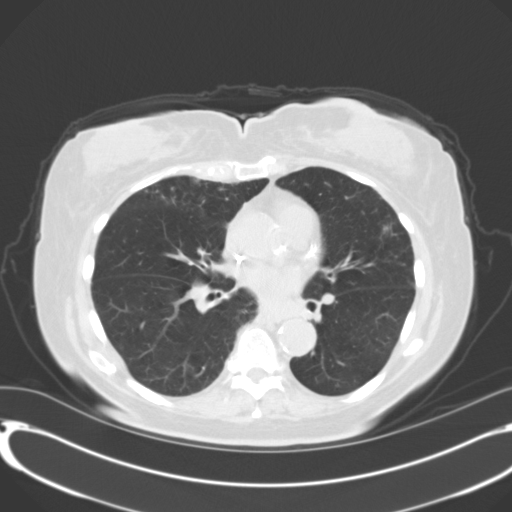
[im 30/59  lung]
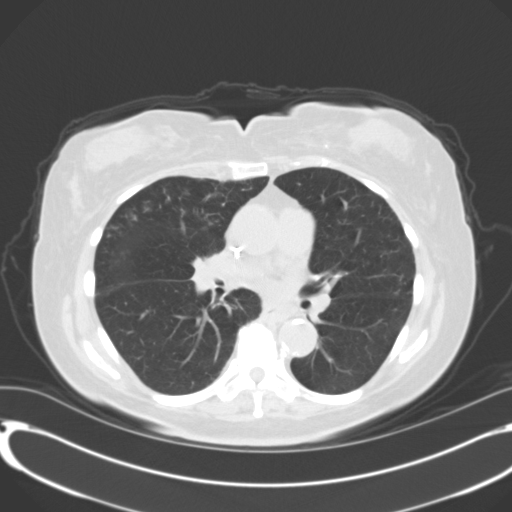
[im 34/59  mediastinal]
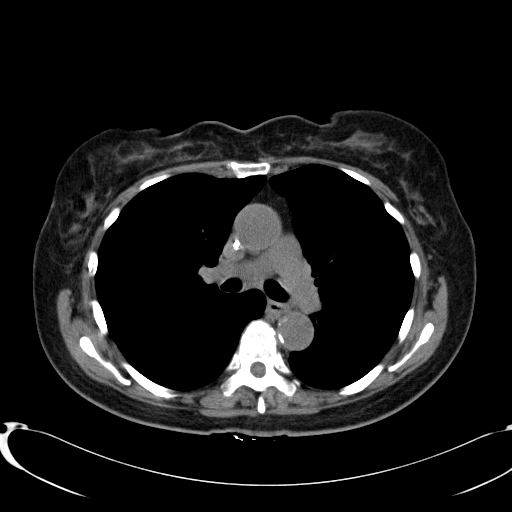
[im 34/59  lung]
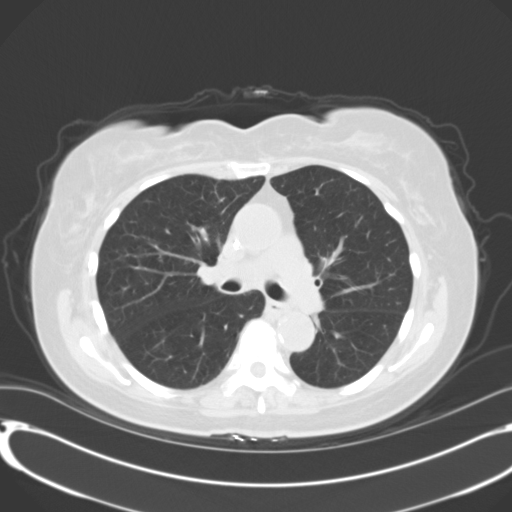
[im 38/59  lung]
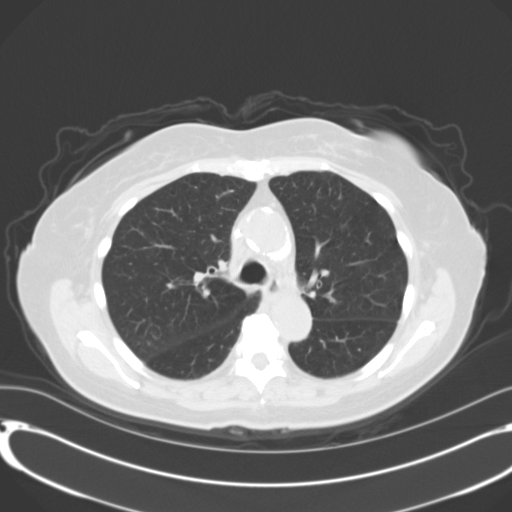
[im 42/59  lung]
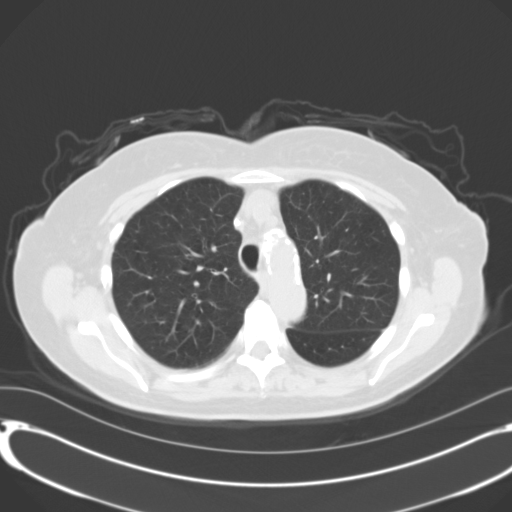
[im 46/59  lung]
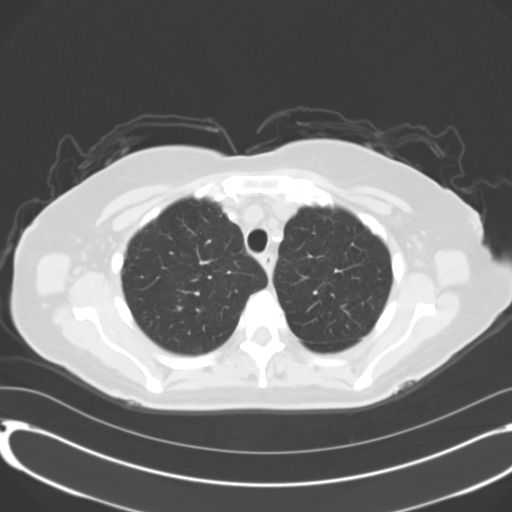
[im 50/59  mediastinal]
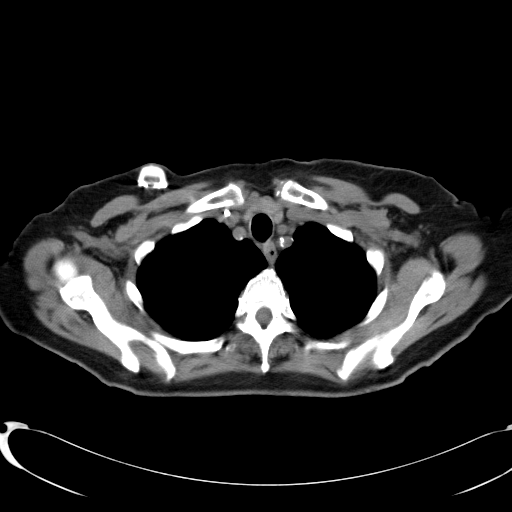
[im 50/59  lung]
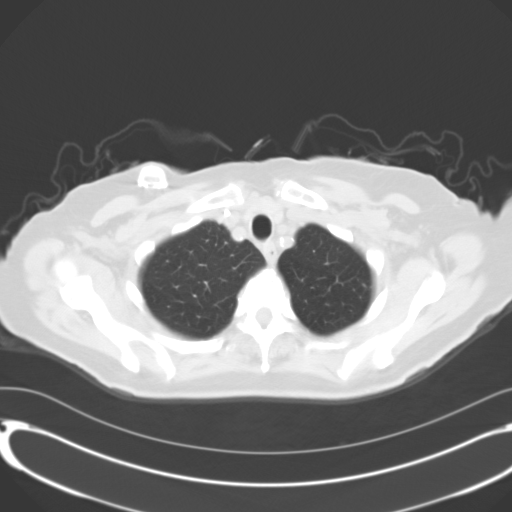
[im 54/59  lung]
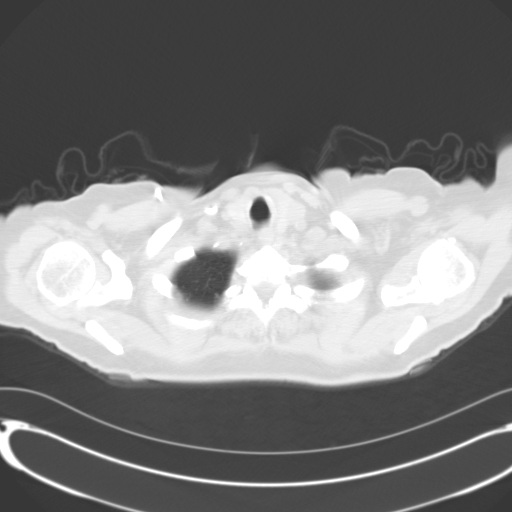

[14 of 30 positions shown; findings below may reference images not displayed]

FINDINGS: Mediastinum/Nodes: Normal heart size. No pericardial
fluid/thickening. Extensive coronary atherosclerosis and aortic
annular calcification. Right internal jugular MediPort terminates in
the lower third of the superior vena cava. Great vessels are normal
in course and caliber. No discrete thyroid nodule. Normal esophagus.
No pathologically enlarged axillary, mediastinal or gross hilar
lymph nodes, noting limited sensitivity for the detection of hilar
adenopathy on this noncontrast study.

Lungs/Pleura: No pneumothorax. No pleural effusion. There is
extensive cylindrical and varicoid bronchiectasis in both lungs in
the basilar right upper lobe, right middle lobe, lingula and both
lower lobes, which has progressed since 04/27/2015. There are
worsened associated patchy tree-in-bud opacities in the mid to lower
lungs bilaterally at the areas of bronchiectasis. There are patchy
nodular regions of peribronchovascular ground-glass opacity and
consolidation in in the basilar lower lobes, which have worsened.

Upper abdomen: Cholecystectomy. Stable low-density 3.8 x 2.5 cm
focus in the left liver lobe and stable 1.9 cm low-density focus in
the right liver lobe, correlating with the areas of treated tumor on
the recent PET-CT. Simple appearing 1.0 cm renal cyst in the upper
left kidney.

Musculoskeletal: Stable patchy sclerotic foci throughout the
visualized lower cervical and thoracic spine, correlating with the
areas of treated tumor on the recent PET-CT. Moderate degenerative
changes in the thoracic spine.
IMPRESSION: 1. Significant interval worsening of extensive bronchiectasis and
tree-in-bud opacities in the mid to lower lungs bilaterally and
worsened basilar lower lobe peribronchovascular ground-glass opacity
and consolidation. These findings are most consistent with worsening
infectious bronchiolitis and bronchopneumonia, favor atypical
mycobacterial infection (VILL) given the significant bronchiectasis.
Recurrent aspiration pneumonitis could also present with this
pattern.
2. Stable areas of treated tumor in the visualized liver and spine.
3. Coronary atherosclerosis.

## 2017-02-07 ENCOUNTER — Encounter: Payer: Self-pay | Admitting: Hematology

## 2017-02-07 ENCOUNTER — Telehealth: Payer: Self-pay | Admitting: Hematology

## 2017-02-07 ENCOUNTER — Other Ambulatory Visit (HOSPITAL_BASED_OUTPATIENT_CLINIC_OR_DEPARTMENT_OTHER): Payer: Medicare Other

## 2017-02-07 ENCOUNTER — Ambulatory Visit (HOSPITAL_BASED_OUTPATIENT_CLINIC_OR_DEPARTMENT_OTHER): Payer: Medicare Other

## 2017-02-07 ENCOUNTER — Ambulatory Visit (HOSPITAL_BASED_OUTPATIENT_CLINIC_OR_DEPARTMENT_OTHER): Payer: Medicare Other | Admitting: Hematology

## 2017-02-07 ENCOUNTER — Ambulatory Visit: Payer: Medicare Other

## 2017-02-07 VITALS — BP 161/77 | HR 81 | Temp 98.4°F | Resp 16 | Ht 61.0 in | Wt 126.4 lb

## 2017-02-07 DIAGNOSIS — C651 Malignant neoplasm of right renal pelvis: Secondary | ICD-10-CM

## 2017-02-07 DIAGNOSIS — C659 Malignant neoplasm of unspecified renal pelvis: Secondary | ICD-10-CM

## 2017-02-07 DIAGNOSIS — C787 Secondary malignant neoplasm of liver and intrahepatic bile duct: Secondary | ICD-10-CM | POA: Diagnosis not present

## 2017-02-07 DIAGNOSIS — J219 Acute bronchiolitis, unspecified: Secondary | ICD-10-CM

## 2017-02-07 DIAGNOSIS — C7951 Secondary malignant neoplasm of bone: Secondary | ICD-10-CM

## 2017-02-07 DIAGNOSIS — R05 Cough: Secondary | ICD-10-CM | POA: Diagnosis not present

## 2017-02-07 DIAGNOSIS — I10 Essential (primary) hypertension: Secondary | ICD-10-CM

## 2017-02-07 DIAGNOSIS — Z95828 Presence of other vascular implants and grafts: Secondary | ICD-10-CM

## 2017-02-07 DIAGNOSIS — Z5112 Encounter for antineoplastic immunotherapy: Secondary | ICD-10-CM | POA: Diagnosis not present

## 2017-02-07 DIAGNOSIS — K625 Hemorrhage of anus and rectum: Secondary | ICD-10-CM

## 2017-02-07 DIAGNOSIS — K529 Noninfective gastroenteritis and colitis, unspecified: Secondary | ICD-10-CM

## 2017-02-07 LAB — CBC & DIFF AND RETIC
BASO%: 0.4 % (ref 0.0–2.0)
BASOS ABS: 0 10*3/uL (ref 0.0–0.1)
EOS ABS: 0 10*3/uL (ref 0.0–0.5)
EOS%: 0.6 % (ref 0.0–7.0)
HEMATOCRIT: 31.9 % — AB (ref 34.8–46.6)
HEMOGLOBIN: 9.8 g/dL — AB (ref 11.6–15.9)
Immature Retic Fract: 9.9 % (ref 1.60–10.00)
LYMPH%: 38.2 % (ref 14.0–49.7)
MCH: 27.8 pg (ref 25.1–34.0)
MCHC: 30.7 g/dL — ABNORMAL LOW (ref 31.5–36.0)
MCV: 90.6 fL (ref 79.5–101.0)
MONO#: 0.6 10*3/uL (ref 0.1–0.9)
MONO%: 13 % (ref 0.0–14.0)
NEUT#: 2.3 10*3/uL (ref 1.5–6.5)
NEUT%: 47.8 % (ref 38.4–76.8)
Platelets: 284 10*3/uL (ref 145–400)
RBC: 3.52 10*6/uL — ABNORMAL LOW (ref 3.70–5.45)
RDW: 16.8 % — AB (ref 11.2–14.5)
RETIC %: 1.58 % (ref 0.70–2.10)
RETIC CT ABS: 55.62 10*3/uL (ref 33.70–90.70)
WBC: 4.7 10*3/uL (ref 3.9–10.3)
lymph#: 1.8 10*3/uL (ref 0.9–3.3)

## 2017-02-07 LAB — COMPREHENSIVE METABOLIC PANEL
ALBUMIN: 3.3 g/dL — AB (ref 3.5–5.0)
ALK PHOS: 43 U/L (ref 40–150)
AST: 16 U/L (ref 5–34)
Anion Gap: 11 mEq/L (ref 3–11)
BUN: 17.6 mg/dL (ref 7.0–26.0)
CALCIUM: 9.2 mg/dL (ref 8.4–10.4)
CO2: 23 mEq/L (ref 22–29)
CREATININE: 1.1 mg/dL (ref 0.6–1.1)
Chloride: 106 mEq/L (ref 98–109)
EGFR: 43 mL/min/{1.73_m2} — ABNORMAL LOW (ref >60)
Glucose: 180 mg/dl — ABNORMAL HIGH (ref 70–140)
Potassium: 3.8 mEq/L (ref 3.5–5.1)
Sodium: 140 mEq/L (ref 136–145)
Total Bilirubin: 0.28 mg/dL (ref 0.20–1.20)
Total Protein: 7.8 g/dL (ref 6.4–8.3)

## 2017-02-07 MED ORDER — SODIUM CHLORIDE 0.9 % IV SOLN
Freq: Once | INTRAVENOUS | Status: AC
  Administered 2017-02-07: 15:00:00 via INTRAVENOUS

## 2017-02-07 MED ORDER — BENZONATATE 100 MG PO CAPS: 100.0000 mg | ORAL_CAPSULE | Freq: Three times a day (TID) | ORAL | 1 refills | Status: DC | PRN

## 2017-02-07 MED ORDER — SODIUM CHLORIDE 0.9 % IJ SOLN
10.0000 mL | INTRAMUSCULAR | Status: DC | PRN
  Administered 2017-02-07: 10 mL via INTRAVENOUS
  Filled 2017-02-07: qty 10

## 2017-02-07 MED ORDER — AZITHROMYCIN 250 MG PO TABS: ORAL_TABLET | ORAL | 0 refills | Status: DC

## 2017-02-07 MED ORDER — SODIUM CHLORIDE 0.9 % IV SOLN
1200.0000 mg | Freq: Once | INTRAVENOUS | Status: AC
  Administered 2017-02-07: 1200 mg via INTRAVENOUS
  Filled 2017-02-07: qty 20

## 2017-02-07 MED ORDER — HEPARIN SOD (PORK) LOCK FLUSH 100 UNIT/ML IV SOLN
500.0000 [IU] | Freq: Once | INTRAVENOUS | Status: AC | PRN
  Administered 2017-02-07: 500 [IU]
  Filled 2017-02-07: qty 5

## 2017-02-07 MED ORDER — SODIUM CHLORIDE 0.9 % IJ SOLN
10.0000 mL | INTRAMUSCULAR | Status: DC | PRN
  Administered 2017-02-07: 10 mL
  Filled 2017-02-07: qty 10

## 2017-02-07 NOTE — Progress Notes (Signed)
Hematology oncology clinic follow-up.  Date of service. 02/07/2017   Patient Care Team: [name], MD as PCP - General (Internal Medicine)   Urologist: Dr. 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  Diagnosis:  Widely metastatic transitional cell carcinoma from the right renal pelvis.  Treatment -Atezolizumab IV q3weeks  Alphonse Guild for bone mets  HISTORY OF PRESENTING ILLNESS: Please see my previous notes for details of initial presentation.  INTERVAL HISTORY Tiffany [name] is here with her husband for her scheduled follow-up and cycle 38 treatment. She has been doing very well overall. No new acute changes. She continues to have a mildly productive cough which has somewhat worsened, but this has not been keeping her awake at night. Her sputum production has been tan in color. She did have a standing abx prescription for this, however, she believes this has expired. She also notes that she has been getting fatigued with activities recently, but nothing unusual from her baseline. She denies diarrhea, rectal bleeding, abdominal pain, fever, chills, rash, flank pain, hematuria, difficulty urinating, shortness of breath, or any other associated symptoms.   MEDICAL HISTORY:  Past Medical History:  Diagnosis Date  . Arthritis     knees 03-10-12 had Cortisone injection  . Cancer [facility]) june 2016   metastatic  . DDD (degenerative disc disease), cervical   . Edema leg    feet and ankles  . Esophagus disorder    Had esophagus stretched  . GERD (gastroesophageal reflux disease)    Benign stricture dilated in 2011  . Gout    Patient notes she has possible gout but has not been on chronic medications for this  . Headache(784.0)   . Hypertension   . Occipital neuralgia   . Occipital neuralgia    Related to cervical degenerative disc disease    . Peptic ulcer disease    Previous history in the remote past  . Ulcerative proctitis ([place])   . Ulcerative proctitis ([place]) 2014    SURGICAL HISTORY: Past Surgical History:  Procedure Laterality Date  . ABDOMINAL HYSTERECTOMY    . APPENDECTOMY    . CHOLECYSTECTOMY    . DILATION AND CURETTAGE OF UTERUS    . ESOPHAGEAL DILATION     For benign stricture in 2011  . ESOPHAGOGASTRODUODENOSCOPY    . NECK SURGERY     20 years ago  . TOTAL ABDOMINAL HYSTERECTOMY W/ BILATERAL SALPINGOOPHORECTOMY     At age 89 due to miscarriage and retained products of conception causing significant uterine bleeding. Patient has been on estrogen replacement therapy since then.    SOCIAL HISTORY: Social History   Socioeconomic History  . Marital status: Married    Spouse name: Not on file  . Number of children: Not on file  . Years of education: Not on file  . Highest education level: Not on file  Social Needs  . Financial resource strain: Not on file  . Food insecurity - worry: Not on file  . Food insecurity - inability: Not on file  . Transportation needs - medical: Not on file  . Transportation needs - non-medical: Not on file  Occupational History  . Not on file  Tobacco Use  . Smoking status: Former Smoker    Last attempt to quit: 11/01/1983    Years since quitting: 33.2  . Smokeless tobacco: Never Used  Substance and Sexual Activity  .  Alcohol use: Yes    Comment: occ  . Drug use: No  . Sexual activity: No  Other Topics Concern  . Not on file  Social History Narrative  . Not on file  Retired as Conservation officer, nature for [employer]. She is very well spoken and intelligent individual and exhibits a keen knowledge of her medical conditions.  FAMILY HISTORY: Family History  Problem Relation Age of Onset  . Lung cancer Sister   . Prostate cancer Brother   . Uterine cancer Maternal Aunt   . Breast cancer Paternal Grandmother   . Hodgkin's lymphoma Sister     ALLERGIES:   is allergic to nitrofurantoin; indomethacin; and lisinopril.  MEDICATIONS:  Current Outpatient Medications  Medication Sig Dispense Refill  . acetaminophen (TYLENOL) 650 MG CR tablet Take 1,300 mg by mouth 2 (two) times daily.    Marland Kitchen amLODipine (NORVASC) 5 MG tablet Take 5 mg by mouth daily.    . benzonatate (TESSALON) 100 MG capsule Take 1 capsule (100 mg total) by mouth 3 (three) times daily as needed for cough. 30 capsule 0  . budesonide (ENTOCORT EC) 3 MG 24 hr capsule Take 1 capsule (3 mg total) by mouth daily. followup with your GI doctor to optimize further management. 30 capsule 1  . COLCRYS 0.6 MG tablet Take 0.6 mg by mouth once daily as needed for gout  1  . estradiol (ESTRACE) 0.5 MG tablet Take 0.5 mg by mouth daily.  1  . guaiFENesin (MUCINEX) 600 MG 12 hr tablet Take 600 mg by mouth 2 (two) times daily.     Marland Kitchen Histamine Dihydrochloride (AUSTRALIAN DREAM ARTHRITIS EX) Apply 1 application topically 2 (two) times daily.    . mirtazapine (REMERON) 15 MG tablet Take 1 tablet (15 mg total) by mouth at bedtime. 90 tablet 1  . PRESCRIPTION MEDICATION Chemo card    . Respiratory Therapy Supplies (FLUTTER) DEVI 1 each by Does not apply route daily. 1 each 0  . senna-docusate (SENNA S) 8.6-50 MG per tablet Take 2 tablets by mouth at bedtime. (Patient taking differently: Take 1-2 tablets by mouth daily as needed for mild constipation. ) 60 tablet 1  . triamcinolone ointment (KENALOG) 0.5 % Apply 1 application topically 2 (two) times daily. 30 g 0  . vitamin B-12 (CYANOCOBALAMIN) 100 MCG tablet Take 100 mcg by mouth daily.     No current facility-administered medications for this visit.    REVIEW OF SYSTEMS:    A 10+ POINT REVIEW OF SYSTEMS WAS OBTAINED including neurology, dermatology, psychiatry, cardiac, respiratory, lymph, extremities, GI, GU, Musculoskeletal, constitutional, breasts, reproductive, HEENT.  All pertinent positives are noted in the HPI.  All others are  negative.   PHYSICAL EXAMINATION: ECOG PERFORMANCE STATUS: 1  Vitals:   02/07/17 1420  BP: (!) 161/77  Pulse: 81  Resp: 16  Temp: 98.4 F (36.9 C)  SpO2: 96%   Filed Weights   02/07/17 1420  Weight: 126 lb 6.4 oz (57.3 kg)     GENERAL elderly Caucasian female, alert, no distress and comfortable SKIN: resolved skin rashes EYES: normal, conjunctiva are pink and non-injected, sclera clear OROPHARYNX:no exudate, no erythema and lips, buccal mucosa, and tongue normal  NECK: supple, thyroid normal size, non-tender, without nodularity LYMPH:  no palpable lymphadenopathy in the cervical, axillary or inguinal LUNGS: few bibasilar rales, intermittent scattered rhonci, good air entry. HEART: regular rate & rhythm and no murmurs and 1+ bilateral lower extremity edema ABDOMEN:abdomen soft, non-tender and normal bowel  sounds Musculoskeletal:no cyanosis of digits and no clubbing  PSYCH: alert & oriented x 3 with fluent speech NEURO: no focal motor/sensory deficits  LABORATORY DATA:  CBC Latest Ref Rng & Units 02/07/2017 01/17/2017 12/27/2016  WBC 3.9 - 10.3 10e3/uL 4.7 4.1 4.5  Hemoglobin 11.6 - 15.9 g/dL 9.8(L) 9.6(L) 9.9(L)  Hematocrit 34.8 - 46.6 % 31.9(L) 31.3(L) 30.7(L)  Platelets 145 - 400 10e3/uL 284 265 324   . CMP Latest Ref Rng & Units 02/07/2017 01/17/2017 12/27/2016  Glucose 70 - 140 mg/dl 180(H) 112 120  BUN 7.0 - 26.0 mg/dL 17.6 13.0 17.2  Creatinine 0.6 - 1.1 mg/dL 1.1 1.1 1.0  Sodium 136 - 145 mEq/L 140 140 138  Potassium 3.5 - 5.1 mEq/L 3.8 3.7 4.2  Chloride 101 - 111 mmol/L - - -  CO2 22 - 29 mEq/L 23 22 25   Calcium 8.4 - 10.4 mg/dL 9.2 9.0 9.3  Total Protein 6.4 - 8.3 g/dL 7.8 7.6 7.8  Total Bilirubin 0.20 - 1.20 mg/dL 0.28 0.35 0.39  Alkaline Phos 40 - 150 U/L 43 40 43  AST 5 - 34 U/L 16 18 18   ALT 0-55 U/L U/L <6 6 <6    Radiology .No results found.  ASSESSMENT & PLAN:   83 year old Caucasian female with  #1 Right Renal pelvis metastatic transitional  cell carcinoma.   Initial PET scan revealed metastases to the lungs, liver, multiple nodal stations and bones. -PET scan done after 8 cycles of treatment for restaging disease showed significant response to treatment with no evidence of active disease. Patient has no clinical evidence of disease progression at this time. CT chest 06/16/2015 showed no evidence of cancer progression. PET/CT scan done on 10/21/2015- showed no findings for metastatic disease. New right-sided hydronephrosis versus local tumor recurrence needs to be evaluated further. MRI of the kidney showed hyperenhancing the right renal pelvis lesion consistent with concern for local recurrence. -PET/CT 03/06/2016 with no findings of metastatic disease. Extensive right-sided hydronephrosis with high activity from excreted FDG obscuring any findings along the collecting system. -PET/CT  06/13/2016- Interval increase in size of right kidney. Findings are suspicious for progression of residual/recurrent urothelial carcinoma with further dilatation of the right renal collecting system. 2. No evidence for hypermetabolic metastases.  -PET/CT 10/22/2016 - Continued interval progression of the abnormal soft tissue associated with the right kidney. This soft tissue remains markedly hypermetabolic and is compatible with continued progression of recurrent right renal disease. 2. No evidence for hypermetabolic metastases in the neck, chest, abdomen, or pelvis. -Has been previously seen by radiation oncology with consideration for local radiation therapy if she develops symptomatic disease - her local progression in the rt renal pelvis is asymptomatic at this time -She is still mostly asymptomatic and responding well to treatment.   #2 h/o skin rash likely related to her Atezolizumab. Minimal grade 1 and topical triamcinolone. Currently quiescent.  #3 Cough and some clear secretions likely due to bronchiectasis. Followed by Dr Melvyn Novas. -No chest pain no  increased shortness of breath or dyspnea on exertion  -Has an action plan with Dr. Melvyn Novas to use when necessary antibiotics for worsening symptoms. -Continue follow-up with Dr. Melvyn Novas for continued cares -She does report that Tiffany [name] did help her with suppressing her cough. We will prescribe this again and I will also place her on a short course of azithromycin as well to hopefully clear this up.    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 Wynetta Emery.  But stopped using it due to cost issues. Was then on PO budesonide which she has stopped as well. had recent sigmoidscopy with Dr Wynetta Emery - showed stable ulcerative proctitis  -will use po budesonide 3mg  po daily on as needed intermittent basis based on symptoms. Has not needed this for a few months at this time.   #5 history of hypertension   -on Amlodipine   Plan -Patient has no new treatment toxicities. No clinical symptoms suggestive of overt disease recurrence/progression at this time. -Labs stable -We'll continue her Atezolizumab q3weeks. -Continue Xgeva every12 weeks - (since patient has completed 2 years of treatment. Will switch Xgeva to q12 weeks from q6 weeks.) -Continue Tessalon Perles and begin course of Zpak  for her cough.  -Plan to repeat PET/CT in early Jan 2018 -We will give her pneumococcal conjugate today.    RTC with Dr Irene Limbo in 3 weeks with labs Continue Atezolizumab q3weeks Continue Xgeva q12 weeks   .I have reviewed the above documentation for accuracy and completeness, and I agree with the above.   Sullivan Lone MD Tiffany Hematology/Oncology Physician [facility]  (Office):       [phone] (Work cell):  [phone] (Fax):           [phone]  This document serves as a record of services personally performed by Sullivan Lone, MD. It was created on his behalf by Reola Mosher, a  trained medical scribe. The creation of this record is based on the scribe's personal observations and the provider's statements to them. This document has been checked and approved by the attending provider.

## 2017-02-07 NOTE — Telephone Encounter (Signed)
Scheduled appt per 11/8 los - Gave patient AVS and calender per los.  

## 2017-02-07 NOTE — Patient Instructions (Signed)
[facility] Discharge Instructions for Patients Receiving Chemotherapy  Today you received the following chemotherapy agents Tecentriq To help prevent nausea and vomiting after your treatment, we encourage you to take your nausea medication as prescribed.   If you develop nausea and vomiting that is not controlled by your nausea medication, call the clinic.   BELOW ARE SYMPTOMS THAT SHOULD BE REPORTED IMMEDIATELY:  *FEVER GREATER THAN 100.5 F  *CHILLS WITH OR WITHOUT FEVER  NAUSEA AND VOMITING THAT IS NOT CONTROLLED WITH YOUR NAUSEA MEDICATION  *UNUSUAL SHORTNESS OF BREATH  *UNUSUAL BRUISING OR BLEEDING  TENDERNESS IN MOUTH AND THROAT WITH OR WITHOUT PRESENCE OF ULCERS  *URINARY PROBLEMS  *BOWEL PROBLEMS  UNUSUAL RASH Items with * indicate a potential emergency and should be followed up as soon as possible.  Feel free to call the clinic should you have any questions or concerns. The clinic phone number is [phone].  Please show the CHEMO ALERT CARD at check-in to the Emergency Department and triage nurse.   

## 2017-02-26 DIAGNOSIS — M25571 Pain in right ankle and joints of right foot: Secondary | ICD-10-CM | POA: Diagnosis not present

## 2017-02-28 ENCOUNTER — Ambulatory Visit: Payer: Medicare Other

## 2017-02-28 ENCOUNTER — Other Ambulatory Visit (HOSPITAL_BASED_OUTPATIENT_CLINIC_OR_DEPARTMENT_OTHER): Payer: Medicare Other

## 2017-02-28 ENCOUNTER — Telehealth: Payer: Self-pay | Admitting: Hematology

## 2017-02-28 ENCOUNTER — Ambulatory Visit (HOSPITAL_BASED_OUTPATIENT_CLINIC_OR_DEPARTMENT_OTHER): Payer: Medicare Other | Admitting: Hematology

## 2017-02-28 ENCOUNTER — Encounter: Payer: Self-pay | Admitting: Hematology

## 2017-02-28 ENCOUNTER — Ambulatory Visit (HOSPITAL_BASED_OUTPATIENT_CLINIC_OR_DEPARTMENT_OTHER): Payer: Medicare Other

## 2017-02-28 VITALS — BP 153/70 | HR 86 | Temp 98.4°F | Ht 61.0 in | Wt 126.9 lb

## 2017-02-28 DIAGNOSIS — C651 Malignant neoplasm of right renal pelvis: Secondary | ICD-10-CM

## 2017-02-28 DIAGNOSIS — Z95828 Presence of other vascular implants and grafts: Secondary | ICD-10-CM

## 2017-02-28 DIAGNOSIS — C659 Malignant neoplasm of unspecified renal pelvis: Secondary | ICD-10-CM

## 2017-02-28 DIAGNOSIS — C787 Secondary malignant neoplasm of liver and intrahepatic bile duct: Secondary | ICD-10-CM

## 2017-02-28 DIAGNOSIS — C778 Secondary and unspecified malignant neoplasm of lymph nodes of multiple regions: Secondary | ICD-10-CM

## 2017-02-28 DIAGNOSIS — Z5112 Encounter for antineoplastic immunotherapy: Secondary | ICD-10-CM

## 2017-02-28 DIAGNOSIS — C7951 Secondary malignant neoplasm of bone: Secondary | ICD-10-CM

## 2017-02-28 DIAGNOSIS — C7800 Secondary malignant neoplasm of unspecified lung: Secondary | ICD-10-CM

## 2017-02-28 DIAGNOSIS — I10 Essential (primary) hypertension: Secondary | ICD-10-CM | POA: Diagnosis not present

## 2017-02-28 LAB — COMPREHENSIVE METABOLIC PANEL
ALT: <6 U/L (ref 0–55)
AST: 18 U/L (ref 5–34)
Albumin: 3.4 g/dL — ABNORMAL LOW (ref 3.5–5.0)
Alkaline Phosphatase: 42 U/L (ref 40–150)
Anion Gap: 9 mEq/L (ref 3–11)
BUN: 18.2 mg/dL (ref 7.0–26.0)
CHLORIDE: 105 meq/L (ref 98–109)
CO2: 24 mEq/L (ref 22–29)
Calcium: 9.4 mg/dL (ref 8.4–10.4)
Creatinine: 1.1 mg/dL (ref 0.6–1.1)
EGFR: 46 mL/min/{1.73_m2} — ABNORMAL LOW (ref >60)
GLUCOSE: 134 mg/dL (ref 70–140)
POTASSIUM: 3.9 meq/L (ref 3.5–5.1)
SODIUM: 139 meq/L (ref 136–145)
Total Bilirubin: 0.3 mg/dL (ref 0.20–1.20)
Total Protein: 7.7 g/dL (ref 6.4–8.3)

## 2017-02-28 LAB — CBC & DIFF AND RETIC
BASO%: 0.2 % (ref 0.0–2.0)
Basophils Absolute: 0 10*3/uL (ref 0.0–0.1)
EOS ABS: 0.1 10*3/uL (ref 0.0–0.5)
EOS%: 1.4 % (ref 0.0–7.0)
HCT: 31.9 % — ABNORMAL LOW (ref 34.8–46.6)
HGB: 9.7 g/dL — ABNORMAL LOW (ref 11.6–15.9)
Immature Retic Fract: 13.2 % — ABNORMAL HIGH (ref 1.60–10.00)
LYMPH%: 42.1 % (ref 14.0–49.7)
MCH: 27.7 pg (ref 25.1–34.0)
MCHC: 30.4 g/dL — AB (ref 31.5–36.0)
MCV: 91.1 fL (ref 79.5–101.0)
MONO#: 0.6 10*3/uL (ref 0.1–0.9)
MONO%: 14.7 % — AB (ref 0.0–14.0)
NEUT%: 41.6 % (ref 38.4–76.8)
NEUTROS ABS: 1.7 10*3/uL (ref 1.5–6.5)
Platelets: 246 10*3/uL (ref 145–400)
RBC: 3.5 10*6/uL — ABNORMAL LOW (ref 3.70–5.45)
RDW: 17.3 % — AB (ref 11.2–14.5)
RETIC %: 1.77 % (ref 0.70–2.10)
Retic Ct Abs: 61.95 10*3/uL (ref 33.70–90.70)
WBC: 4.2 10*3/uL (ref 3.9–10.3)
lymph#: 1.8 10*3/uL (ref 0.9–3.3)

## 2017-02-28 MED ORDER — SODIUM CHLORIDE 0.9 % IJ SOLN
10.0000 mL | INTRAMUSCULAR | Status: DC | PRN
  Filled 2017-02-28: qty 10

## 2017-02-28 MED ORDER — SODIUM CHLORIDE 0.9 % IJ SOLN
10.0000 mL | INTRAMUSCULAR | Status: AC | PRN
  Administered 2017-02-28 (×2): 10 mL via INTRAVENOUS
  Filled 2017-02-28: qty 10

## 2017-02-28 MED ORDER — HEPARIN SOD (PORK) LOCK FLUSH 100 UNIT/ML IV SOLN
500.0000 [IU] | Freq: Once | INTRAVENOUS | Status: AC | PRN
  Administered 2017-02-28: 500 [IU]
  Filled 2017-02-28: qty 5

## 2017-02-28 MED ORDER — SODIUM CHLORIDE 0.9 % IV SOLN
1200.0000 mg | Freq: Once | INTRAVENOUS | Status: AC
  Administered 2017-02-28: 1200 mg via INTRAVENOUS
  Filled 2017-02-28: qty 20

## 2017-02-28 MED ORDER — DENOSUMAB 120 MG/1.7ML ~~LOC~~ SOLN
120.0000 mg | Freq: Once | SUBCUTANEOUS | Status: AC
  Administered 2017-02-28: 120 mg via SUBCUTANEOUS
  Filled 2017-02-28: qty 1.7

## 2017-02-28 MED ORDER — SODIUM CHLORIDE 0.9 % IV SOLN
Freq: Once | INTRAVENOUS | Status: AC
  Administered 2017-02-28: 15:00:00 via INTRAVENOUS

## 2017-02-28 NOTE — Patient Instructions (Signed)
Thank you for choosing [facility] to provide your oncology and hematology care.  To afford each patient quality time with our providers, please arrive 30 minutes before your scheduled appointment time.  If you arrive late for your appointment, you may be asked to reschedule.  We strive to give you quality time with our providers, and arriving late affects you and other patients whose appointments are after yours.   If you are a no show for multiple scheduled visits, you may be dismissed from the clinic at the providers discretion.    Again, thank you for choosing [facility], our hope is that these requests will decrease the amount of time that you wait before being seen by our physicians.  ______________________________________________________________________  Should you have questions after your visit to the [facility], please contact our office at [phone] between the hours of 8:30 and 4:30 p.m.    Voicemails left after 4:30p.m will not be returned until the following business day.    For prescription refill requests, please have your pharmacy contact us directly.  Please also try to allow 48 hours for prescription requests.    Please contact the scheduling department for questions regarding scheduling.  For scheduling of procedures such as PET scans, CT scans, MRI, Ultrasound, etc please contact central scheduling at [phone].    Resources For Cancer Patients and Caregivers:   Oncolink.org:  A wonderful resource for patients and healthcare providers for information regarding your disease, ways to tract your treatment, what to expect, etc.     American Cancer Society:  800-227-2345  Can help patients locate various types of support and financial assistance  Cancer Care: 1-800-813-HOPE (4673) Provides financial assistance, online support groups, medication/co-pay assistance.    [place] DSS:  336-641-3447 Where to apply for food  stamps, Medicaid, and utility assistance  Medicare Rights Center: 800-333-4114 Helps people with Medicare understand their rights and benefits, navigate the Medicare system, and secure the quality healthcare they deserve  SCAT: 336-333-6589 [place] [employer]'s shared-ride transportation service for eligible riders who have a disability that prevents them from riding the fixed route bus.    For additional information on assistance programs please contact our social worker:   Grier Hock/Abigail Elmore:  [phone]            

## 2017-02-28 NOTE — Patient Instructions (Signed)
Implanted Port Home Guide An implanted port is a type of central line that is placed under the skin. Central lines are used to provide IV access when treatment or nutrition needs to be given through a person's veins. Implanted ports are used for long-term IV access. An implanted port may be placed because:  You need IV medicine that would be irritating to the small veins in your hands or arms.  You need long-term IV medicines, such as antibiotics.  You need IV nutrition for a long period.  You need frequent blood draws for lab tests.  You need dialysis.  Implanted ports are usually placed in the chest area, but they can also be placed in the upper arm, the abdomen, or the leg. An implanted port has two main parts:  Reservoir. The reservoir is round and will appear as a small, raised area under your skin. The reservoir is the part where a needle is inserted to give medicines or draw blood.  Catheter. The catheter is a thin, flexible tube that extends from the reservoir. The catheter is placed into a large vein. Medicine that is inserted into the reservoir goes into the catheter and then into the vein.  How will I care for my incision site? Do not get the incision site wet. Bathe or shower as directed by your health care provider. How is my port accessed? Special steps must be taken to access the port:  Before the port is accessed, a numbing cream can be placed on the skin. This helps numb the skin over the port site.  Your health care provider uses a sterile technique to access the port. ? Your health care provider must put on a mask and sterile gloves. ? The skin over your port is cleaned carefully with an antiseptic and allowed to dry. ? The port is gently pinched between sterile gloves, and a needle is inserted into the port.  Only "non-coring" port needles should be used to access the port. Once the port is accessed, a blood return should be checked. This helps ensure that the port  is in the vein and is not clogged.  If your port needs to remain accessed for a constant infusion, a clear (transparent) bandage will be placed over the needle site. The bandage and needle will need to be changed every week, or as directed by your health care provider.  Keep the bandage covering the needle clean and dry. Do not get it wet. Follow your health care provider's instructions on how to take a shower or bath while the port is accessed.  If your port does not need to stay accessed, no bandage is needed over the port.  What is flushing? Flushing helps keep the port from getting clogged. Follow your health care provider's instructions on how and when to flush the port. Ports are usually flushed with saline solution or a medicine called heparin. The need for flushing will depend on how the port is used.  If the port is used for intermittent medicines or blood draws, the port will need to be flushed: ? After medicines have been given. ? After blood has been drawn. ? As part of routine maintenance.  If a constant infusion is running, the port may not need to be flushed.  How long will my port stay implanted? The port can stay in for as long as your health care provider thinks it is needed. When it is time for the port to come out, surgery will be   done to remove it. The procedure is similar to the one performed when the port was put in. When should I seek immediate medical care? When you have an implanted port, you should seek immediate medical care if:  You notice a bad smell coming from the incision site.  You have swelling, redness, or drainage at the incision site.  You have more swelling or pain at the port site or the surrounding area.  You have a fever that is not controlled with medicine.  This information is not intended to replace advice given to you by your health care provider. Make sure you discuss any questions you have with your health care provider. Document  Released: 03/19/2005 Document Revised: 08/25/2015 Document Reviewed: 11/24/2012 Elsevier Interactive Patient Education  2017 Elsevier Inc.  

## 2017-02-28 NOTE — Telephone Encounter (Signed)
Gave avs and calendar for December - February 2019 °

## 2017-02-28 NOTE — Patient Instructions (Addendum)
[facility] Discharge Instructions for Patients Receiving Chemotherapy  Today you received the following chemotherapy agents Atezolizumab (Tecentriq).  To help prevent nausea and vomiting after your treatment, we encourage you to take your nausea medication as prescribed.  If you develop nausea and vomiting that is not controlled by your nausea medication, call the clinic.   BELOW ARE SYMPTOMS THAT SHOULD BE REPORTED IMMEDIATELY:  *FEVER GREATER THAN 100.5 F  *CHILLS WITH OR WITHOUT FEVER  NAUSEA AND VOMITING THAT IS NOT CONTROLLED WITH YOUR NAUSEA MEDICATION  *UNUSUAL SHORTNESS OF BREATH  *UNUSUAL BRUISING OR BLEEDING  TENDERNESS IN MOUTH AND THROAT WITH OR WITHOUT PRESENCE OF ULCERS  *URINARY PROBLEMS  *BOWEL PROBLEMS  UNUSUAL RASH Items with * indicate a potential emergency and should be followed up as soon as possible.  Feel free to call the clinic should you have any questions or concerns. The clinic phone number is [phone].  Please show the Kenneth at check-in to the Emergency Department and triage nurse.  Denosumab injection What is this medicine? DENOSUMAB (den oh sue mab) slows bone breakdown. Prolia is used to treat osteoporosis in women after menopause and in men. Delton See is used to treat a high calcium level due to cancer and to prevent bone fractures and other bone problems caused by multiple myeloma or cancer bone metastases. Delton See is also used to treat giant cell tumor of the bone. This medicine may be used for other purposes; ask your health care provider or pharmacist if you have questions. COMMON BRAND NAME(S): Prolia, XGEVA What should I tell my health care provider before I take this medicine? They need to know if you have any of these conditions: -dental disease -having surgery or tooth extraction -infection -kidney disease -low levels of calcium or Vitamin D in the blood -malnutrition -on hemodialysis -skin conditions or  sensitivity -thyroid or parathyroid disease -an unusual reaction to denosumab, other medicines, foods, dyes, or preservatives -pregnant or trying to get pregnant -breast-feeding How should I use this medicine? This medicine is for injection under the skin. It is given by a health care professional in a hospital or clinic setting. If you are getting Prolia, a special MedGuide will be given to you by the pharmacist with each prescription and refill. Be sure to read this information carefully each time. For Prolia, talk to your pediatrician regarding the use of this medicine in children. Special care may be needed. For Delton See, talk to your pediatrician regarding the use of this medicine in children. While this drug may be prescribed for children as young as 13 years for selected conditions, precautions do apply. Overdosage: If you think you have taken too much of this medicine contact a poison control center or emergency room at once. NOTE: This medicine is only for you. Do not share this medicine with others. What if I miss a dose? It is important not to miss your dose. Call your doctor or health care professional if you are unable to keep an appointment. What may interact with this medicine? Do not take this medicine with any of the following medications: -other medicines containing denosumab This medicine may also interact with the following medications: -medicines that lower your chance of fighting infection -steroid medicines like prednisone or cortisone This list may not describe all possible interactions. Give your health care provider a list of all the medicines, herbs, non-prescription drugs, or dietary supplements you use. Also tell them if you smoke, drink alcohol, or use illegal  drugs. Some items may interact with your medicine. What should I watch for while using this medicine? Visit your doctor or health care professional for regular checks on your progress. Your doctor or health care  professional may order blood tests and other tests to see how you are doing. Call your doctor or health care professional for advice if you get a fever, chills or sore throat, or other symptoms of a cold or flu. Do not treat yourself. This drug may decrease your body's ability to fight infection. Try to avoid being around people who are sick. You should make sure you get enough calcium and vitamin D while you are taking this medicine, unless your doctor tells you not to. Discuss the foods you eat and the vitamins you take with your health care professional. See your dentist regularly. Brush and floss your teeth as directed. Before you have any dental work done, tell your dentist you are receiving this medicine. Do not become pregnant while taking this medicine or for 5 months after stopping it. Talk with your doctor or health care professional about your birth control options while taking this medicine. Women should inform their doctor if they wish to become pregnant or think they might be pregnant. There is a potential for serious side effects to an unborn child. Talk to your health care professional or pharmacist for more information. What side effects may I notice from receiving this medicine? Side effects that you should report to your doctor or health care professional as soon as possible: -allergic reactions like skin rash, itching or hives, swelling of the face, lips, or tongue -bone pain -breathing problems -dizziness -jaw pain, especially after dental work -redness, blistering, peeling of the skin -signs and symptoms of infection like fever or chills; cough; sore throat; pain or trouble passing urine -signs of low calcium like fast heartbeat, muscle cramps or muscle pain; pain, tingling, numbness in the hands or feet; seizures -unusual bleeding or bruising -unusually weak or tired Side effects that usually do not require medical attention (report to your doctor or health care professional  if they continue or are bothersome): -constipation -diarrhea -headache -joint pain -loss of appetite -muscle pain -runny nose -tiredness -upset stomach This list may not describe all possible side effects. Call your doctor for medical advice about side effects. You may report side effects to FDA at 1-800-FDA-1088. Where should I keep my medicine? This medicine is only given in a clinic, doctor's office, or other health care setting and will not be stored at home. NOTE: This sheet is a summary. It may not cover all possible information. If you have questions about this medicine, talk to your doctor, pharmacist, or health care provider.  2018 Elsevier/Gold Standard (2016-04-10 19:17:21)  

## 2017-02-28 NOTE — Progress Notes (Signed)
Hematology oncology clinic follow-up.  Date of service. 02/28/2017   Patient Care Team: [name], MD as PCP - General (Internal Medicine)   Urologist: Dr. 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  Diagnosis:  Widely metastatic transitional cell carcinoma from the right renal pelvis.  Treatment -Atezolizumab IV q3weeks  Alphonse Guild for bone mets  HISTORY OF PRESENTING ILLNESS: Please see my previous notes for details of initial presentation.  INTERVAL HISTORY Tiffany [name] is here for her scheduled follow-up and cycle 39 treatment. No new acute changes. She reports that she has been doing very well overall in the interim. She reports that she was recently seen for right ankle pain. She states that some mornings (usually around 4AM) she will experience this aching, stiff pain to the right ankle which is better when she gets up and walks around. An XR was recently performed of the ankle which was negative, per pt. She has started wrapping the ankle before bed, but this has not been helping with her pain. She denies any swelling to the ankle. She continues to have a mildly productive cough which has somewhat worsened, but this has not been keeping her awake at night. Her sputum production has continued to be tan in color. She was given abx last time she was here, but she did not notice any change in her cough.   On ROS, she denies diarrhea, rectal bleeding, abdominal pain, fever, chills, rash, flank pain, hematuria, difficulty urinating, shortness of breath, or any other associated symptoms. Pertinent positives are listed within the above HPI.   MEDICAL HISTORY:  Past Medical History:  Diagnosis Date  . Arthritis     knees 03-10-12 had Cortisone injection  . Cancer [facility]) june 2016   metastatic  . DDD (degenerative disc disease), cervical     . Edema leg    feet and ankles  . Esophagus disorder    Had esophagus stretched  . GERD (gastroesophageal reflux disease)    Benign stricture dilated in 2011  . Gout    Patient notes she has possible gout but has not been on chronic medications for this  . Headache(784.0)   . Hypertension   . Occipital neuralgia   . Occipital neuralgia    Related to cervical degenerative disc disease  . Peptic ulcer disease    Previous history in the remote past  . Ulcerative proctitis ([place])   . Ulcerative proctitis ([place]) 2014    SURGICAL HISTORY: Past Surgical History:  Procedure Laterality Date  . ABDOMINAL HYSTERECTOMY    . APPENDECTOMY    . CHOLECYSTECTOMY    . COLONOSCOPY WITH PROPOFOL  03/25/2012   Procedure: COLONOSCOPY WITH PROPOFOL;  Surgeon: Garlan Fair, MD;  Location: [facility];  Service: Endoscopy;  Laterality: N/A;  . DILATION AND CURETTAGE OF UTERUS    . ESOPHAGEAL DILATION     For benign stricture in 2011  . ESOPHAGOGASTRODUODENOSCOPY    . FLEXIBLE SIGMOIDOSCOPY N/A 04/26/2014   Procedure: FLEXIBLE SIGMOIDOSCOPY - UnSedated;  Surgeon: Garlan Fair, MD;  Location: [facility];  Service: Endoscopy;  Laterality: N/A;  . FLEXIBLE SIGMOIDOSCOPY N/A 01/24/2015   Procedure: FLEXIBLE SIGNMOIDOSCOPY W/ FLEET ENEMIA;  Surgeon: Garlan Fair, MD;  Location: [facility];  Service: Endoscopy;  Laterality: N/A;  . FLEXIBLE SIGMOIDOSCOPY N/A 05/15/2016   Procedure: FLEXIBLE SIGMOIDOSCOPY;  Surgeon: Garlan Fair, MD;  Location:  [facility];  Service: Endoscopy;  Laterality: N/A;  pt needs to ahve an enema upon arrival   . NECK SURGERY     20 years ago  . TOTAL ABDOMINAL HYSTERECTOMY W/ BILATERAL SALPINGOOPHORECTOMY     At age 71 due to miscarriage and retained products of conception causing significant uterine bleeding. Patient has been on estrogen replacement therapy since then.    SOCIAL HISTORY: Social History   Socioeconomic History  . Marital status: Married     Spouse name: Not on file  . Number of children: Not on file  . Years of education: Not on file  . Highest education level: Not on file  Social Needs  . Financial resource strain: Not on file  . Food insecurity - worry: Not on file  . Food insecurity - inability: Not on file  . Transportation needs - medical: Not on file  . Transportation needs - non-medical: Not on file  Occupational History  . Not on file  Tobacco Use  . Smoking status: Former Smoker    Last attempt to quit: 11/01/1983    Years since quitting: 33.3  . Smokeless tobacco: Never Used  Substance and Sexual Activity  . Alcohol use: Yes    Comment: occ  . Drug use: No  . Sexual activity: No  Other Topics Concern  . Not on file  Social History Narrative  . Not on file  Retired as Conservation officer, nature for [employer]. She is very well spoken and intelligent individual and exhibits a keen knowledge of her medical conditions.  FAMILY HISTORY: Family History  Problem Relation Age of Onset  . Lung cancer Sister   . Prostate cancer Brother   . Uterine cancer Maternal Aunt   . Breast cancer Paternal Grandmother   . Hodgkin's lymphoma Sister     ALLERGIES:  is allergic to nitrofurantoin; indomethacin; and lisinopril.  MEDICATIONS:  Current Outpatient Medications  Medication Sig Dispense Refill  . acetaminophen (TYLENOL) 650 MG CR tablet Take 1,300 mg by mouth 2 (two) times daily.    Marland Kitchen amLODipine (NORVASC) 5 MG tablet Take 5 mg by mouth daily.    Marland Kitchen azithromycin (ZITHROMAX) 250 MG tablet 2 tab x 1 days then 1 tab daily for 4 days 6 each 0  . benzonatate (TESSALON) 100 MG capsule Take 1 capsule (100 mg total) 3 (three) times daily as needed by mouth for cough. 90 capsule 1  . budesonide (ENTOCORT EC) 3 MG 24 hr capsule Take 1 capsule (3 mg total) by mouth daily. followup with your GI doctor to optimize further management. 30 capsule 1  . COLCRYS 0.6 MG tablet Take 0.6 mg by mouth once daily as needed for gout   1  . estradiol (ESTRACE) 0.5 MG tablet Take 0.5 mg by mouth daily.  1  . guaiFENesin (MUCINEX) 600 MG 12 hr tablet Take 600 mg by mouth 2 (two) times daily.     Marland Kitchen Histamine Dihydrochloride (AUSTRALIAN DREAM ARTHRITIS EX) Apply 1 application topically 2 (two) times daily.    . mirtazapine (REMERON) 15 MG tablet Take 1 tablet (15 mg total) by mouth at bedtime. 90 tablet 1  . PRESCRIPTION MEDICATION Chemo card    . Respiratory Therapy Supplies (FLUTTER) DEVI 1 each by Does not apply route daily. 1 each 0  . senna-docusate (SENNA S) 8.6-50 MG per tablet Take 2 tablets by mouth at bedtime. (Patient taking differently: Take 1-2 tablets by mouth daily as needed for mild constipation. ) 60  tablet 1  . triamcinolone ointment (KENALOG) 0.5 % Apply 1 application topically 2 (two) times daily. 30 g 0  . vitamin B-12 (CYANOCOBALAMIN) 100 MCG tablet Take 100 mcg by mouth daily.     No current facility-administered medications for this visit.    Facility-Administered Medications Ordered in Other Visits  Medication Dose Route Frequency Provider Last Rate Last Dose  . sodium chloride 0.9 % injection 10 mL  10 mL Intravenous PRN Tiffany Genera, MD   10 mL at 02/28/17 1323   REVIEW OF SYSTEMS:    A 10+ POINT REVIEW OF SYSTEMS WAS OBTAINED including neurology, dermatology, psychiatry, cardiac, respiratory, lymph, extremities, GI, GU, Musculoskeletal, constitutional, breasts, reproductive, HEENT.  All pertinent positives are noted in the HPI.  All others are negative.   PHYSICAL EXAMINATION: ECOG PERFORMANCE STATUS: 1  Vitals:   02/28/17 1340  BP: (!) 153/70  Pulse: 86  Temp: 98.4 F (36.9 C)  SpO2: 94%   Filed Weights   02/28/17 1340  Weight: 126 lb 14.4 oz (57.6 kg)     GENERAL elderly Caucasian female, alert, no distress and comfortable SKIN: resolved skin rashes EYES: normal, conjunctiva are pink and non-injected, sclera clear OROPHARYNX:no exudate, no erythema and lips, buccal mucosa,  and tongue normal  NECK: supple, thyroid normal size, non-tender, without nodularity LYMPH:  no palpable lymphadenopathy in the cervical, axillary or inguinal LUNGS: few bibasilar rales, intermittent scattered rhonci, good air entry. HEART: regular rate & rhythm and no murmurs and 1+ bilateral lower extremity edema ABDOMEN:abdomen soft, non-tender and normal bowel sounds Musculoskeletal:no cyanosis of digits and no clubbing  PSYCH: alert & oriented x 3 with fluent speech NEURO: no focal motor/sensory deficits  LABORATORY DATA:  CBC Latest Ref Rng & Units 02/28/2017 02/07/2017 01/17/2017  WBC 3.9 - 10.3 10e3/uL 4.2 4.7 4.1  Hemoglobin 11.6 - 15.9 g/dL 9.7(L) 9.8(L) 9.6(L)  Hematocrit 34.8 - 46.6 % 31.9(L) 31.9(L) 31.3(L)  Platelets 145 - 400 10e3/uL 246 284 265   . CMP Latest Ref Rng & Units 02/28/2017 02/07/2017 01/17/2017  Glucose 70 - 140 mg/dl 134 180(H) 112  BUN 7.0 - 26.0 mg/dL 18.2 17.6 13.0  Creatinine 0.6 - 1.1 mg/dL 1.1 1.1 1.1  Sodium 136 - 145 mEq/L 139 140 140  Potassium 3.5 - 5.1 mEq/L 3.9 3.8 3.7  Chloride 101 - 111 mmol/L - - -  CO2 22 - 29 mEq/L 24 23 22   Calcium 8.4 - 10.4 mg/dL 9.4 9.2 9.0  Total Protein 6.4 - 8.3 g/dL 7.7 7.8 7.6  Total Bilirubin 0.20 - 1.20 mg/dL 0.30 0.28 0.35  Alkaline Phos 40 - 150 U/L 42 43 40  AST 5 - 34 U/L 18 16 18   ALT 0-55 U/L U/L <6 <6 6    Radiology .No results found.  ASSESSMENT & PLAN:   83 year old Caucasian female with  #1 Right Renal pelvis metastatic transitional cell carcinoma.   Initial PET scan revealed metastases to the lungs, liver, multiple nodal stations and bones. -PET scan done after 8 cycles of treatment for restaging disease showed significant response to treatment with no evidence of active disease. Patient has no clinical evidence of disease progression at this time. CT chest 06/16/2015 showed no evidence of cancer progression. PET/CT scan done on 10/21/2015- showed no findings for metastatic disease. New  right-sided hydronephrosis versus local tumor recurrence needs to be evaluated further. MRI of the kidney showed hyperenhancing the right renal pelvis lesion consistent with concern for local recurrence. -PET/CT 03/06/2016 with no  findings of metastatic disease. Extensive right-sided hydronephrosis with high activity from excreted FDG obscuring any findings along the collecting system. -PET/CT  06/13/2016- Interval increase in size of right kidney. Findings are suspicious for progression of residual/recurrent urothelial carcinoma with further dilatation of the right renal collecting system. 2. No evidence for hypermetabolic metastases.  -PET/CT 10/22/2016 - Continued interval progression of the abnormal soft tissue associated with the right kidney. This soft tissue remains markedly hypermetabolic and is compatible with continued progression of recurrent right renal disease. 2. No evidence for hypermetabolic metastases in the neck, chest, abdomen, or pelvis. -Has been previously seen by radiation oncology with consideration for local radiation therapy if she develops symptomatic disease - her local progression in the rt renal pelvis is asymptomatic at this time -She is still mostly asymptomatic and responding well to treatment.   #2 h/o skin rash likely related to her Atezolizumab. Minimal grade 1 and topical triamcinolone. Currently quiescent.  #3 Cough and some clear secretions likely due to bronchiectasis. Followed by Dr Melvyn Novas. -No chest pain no increased shortness of breath or dyspnea on exertion  -Has an action plan with Dr. Melvyn Novas to use when necessary antibiotics for worsening symptoms. -Continue follow-up with Dr. 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 Wynetta Emery.  But stopped using it due to cost issues. Was then on PO budesonide which  she has stopped as well. had recent sigmoidscopy with Dr Wynetta Emery - showed stable ulcerative proctitis  -will use po budesonide 3mg  po daily on as needed intermittent basis based on symptoms. Has not needed this for a few months at this time.  #5 history of hypertension   -on Amlodipine   Plan -Patient has no new treatment toxicities. No clinical symptoms suggestive of overt disease recurrence/progression at this time. -Labs stable -We'll continue her Atezolizumab q3weeks. -Continue Xgeva every12 weeks - (since patient has completed 2 years of treatment. Will switch Xgeva to q12 weeks from q6 weeks.) -Plan to repeat PET/CT in early Jan 2018  Continue Atezolizumab q3weeks Continue Xgeva q12 weeks PET/CT in 5 weeks RTC with Dr Irene Limbo in 6 weeks with the treatment after the next one   Sullivan Lone MD [place]  (Office):       [phone] (Work cell):  [phone] (Fax):           [phone]  This document serves as a record of services personally performed by Sullivan Lone, MD. It was created on his behalf by Reola Mosher, a trained medical scribe. The creation of this record is based on the scribe's personal observations and the provider's statements to them.   .I have reviewed the above documentation for accuracy and completeness, and I agree with the above. Tiffany Genera MD Tiffany

## 2017-03-02 ENCOUNTER — Other Ambulatory Visit: Payer: Self-pay | Admitting: Nurse Practitioner

## 2017-03-18 DIAGNOSIS — Z1389 Encounter for screening for other disorder: Secondary | ICD-10-CM | POA: Diagnosis not present

## 2017-03-18 DIAGNOSIS — J479 Bronchiectasis, uncomplicated: Secondary | ICD-10-CM | POA: Diagnosis not present

## 2017-03-18 DIAGNOSIS — Z0000 Encounter for general adult medical examination without abnormal findings: Secondary | ICD-10-CM | POA: Diagnosis not present

## 2017-03-18 DIAGNOSIS — M8588 Other specified disorders of bone density and structure, other site: Secondary | ICD-10-CM | POA: Diagnosis not present

## 2017-03-21 ENCOUNTER — Ambulatory Visit (HOSPITAL_BASED_OUTPATIENT_CLINIC_OR_DEPARTMENT_OTHER): Payer: Medicare Other

## 2017-03-21 ENCOUNTER — Other Ambulatory Visit (HOSPITAL_BASED_OUTPATIENT_CLINIC_OR_DEPARTMENT_OTHER): Payer: Medicare Other

## 2017-03-21 DIAGNOSIS — C651 Malignant neoplasm of right renal pelvis: Secondary | ICD-10-CM

## 2017-03-21 DIAGNOSIS — Z5112 Encounter for antineoplastic immunotherapy: Secondary | ICD-10-CM | POA: Diagnosis not present

## 2017-03-21 DIAGNOSIS — C659 Malignant neoplasm of unspecified renal pelvis: Secondary | ICD-10-CM

## 2017-03-21 DIAGNOSIS — Z79899 Other long term (current) drug therapy: Secondary | ICD-10-CM | POA: Diagnosis not present

## 2017-03-21 DIAGNOSIS — Z95828 Presence of other vascular implants and grafts: Secondary | ICD-10-CM

## 2017-03-21 DIAGNOSIS — Z452 Encounter for adjustment and management of vascular access device: Secondary | ICD-10-CM | POA: Diagnosis not present

## 2017-03-21 DIAGNOSIS — C7951 Secondary malignant neoplasm of bone: Secondary | ICD-10-CM

## 2017-03-21 DIAGNOSIS — C787 Secondary malignant neoplasm of liver and intrahepatic bile duct: Secondary | ICD-10-CM

## 2017-03-21 LAB — CBC & DIFF AND RETIC
BASO%: 0.2 % (ref 0.0–2.0)
Basophils Absolute: 0 10*3/uL (ref 0.0–0.1)
EOS%: 1.3 % (ref 0.0–7.0)
Eosinophils Absolute: 0.1 10*3/uL (ref 0.0–0.5)
HCT: 29.3 % — ABNORMAL LOW (ref 34.8–46.6)
HGB: 9 g/dL — ABNORMAL LOW (ref 11.6–15.9)
Immature Retic Fract: 11.5 % — ABNORMAL HIGH (ref 1.60–10.00)
LYMPH#: 1.6 10*3/uL (ref 0.9–3.3)
LYMPH%: 36.4 % (ref 14.0–49.7)
MCH: 27.9 pg (ref 25.1–34.0)
MCHC: 30.7 g/dL — AB (ref 31.5–36.0)
MCV: 90.7 fL (ref 79.5–101.0)
MONO#: 0.6 10*3/uL (ref 0.1–0.9)
MONO%: 14.1 % — ABNORMAL HIGH (ref 0.0–14.0)
NEUT#: 2.2 10*3/uL (ref 1.5–6.5)
NEUT%: 48 % (ref 38.4–76.8)
Platelets: 311 10*3/uL (ref 145–400)
RBC: 3.23 10*6/uL — AB (ref 3.70–5.45)
RDW: 16.3 % — AB (ref 11.2–14.5)
RETIC %: 1.25 % (ref 0.70–2.10)
RETIC CT ABS: 40.38 10*3/uL (ref 33.70–90.70)
WBC: 4.5 10*3/uL (ref 3.9–10.3)

## 2017-03-21 LAB — COMPREHENSIVE METABOLIC PANEL
ANION GAP: 10 meq/L (ref 3–11)
AST: 13 U/L (ref 5–34)
Albumin: 3.2 g/dL — ABNORMAL LOW (ref 3.5–5.0)
Alkaline Phosphatase: 44 U/L (ref 40–150)
BUN: 19.5 mg/dL (ref 7.0–26.0)
CHLORIDE: 104 meq/L (ref 98–109)
CO2: 25 meq/L (ref 22–29)
CREATININE: 1.1 mg/dL (ref 0.6–1.1)
Calcium: 9.4 mg/dL (ref 8.4–10.4)
EGFR: 43 mL/min/{1.73_m2} — ABNORMAL LOW (ref >60)
Glucose: 141 mg/dl — ABNORMAL HIGH (ref 70–140)
POTASSIUM: 4.3 meq/L (ref 3.5–5.1)
Sodium: 139 mEq/L (ref 136–145)
Total Bilirubin: 0.32 mg/dL (ref 0.20–1.20)
Total Protein: 7.5 g/dL (ref 6.4–8.3)

## 2017-03-21 LAB — TSH: TSH: 0.833 m(IU)/L (ref 0.308–3.960)

## 2017-03-21 MED ORDER — SODIUM CHLORIDE 0.9 % IJ SOLN
10.0000 mL | INTRAMUSCULAR | Status: DC | PRN
  Administered 2017-03-21: 10 mL via INTRAVENOUS
  Filled 2017-03-21: qty 10

## 2017-03-21 MED ORDER — SODIUM CHLORIDE 0.9 % IV SOLN
Freq: Once | INTRAVENOUS | Status: AC
  Administered 2017-03-21: 16:00:00 via INTRAVENOUS

## 2017-03-21 MED ORDER — HEPARIN SOD (PORK) LOCK FLUSH 100 UNIT/ML IV SOLN
500.0000 [IU] | Freq: Once | INTRAVENOUS | Status: AC | PRN
  Administered 2017-03-21: 500 [IU]
  Filled 2017-03-21: qty 5

## 2017-03-21 MED ORDER — SODIUM CHLORIDE 0.9 % IJ SOLN
10.0000 mL | INTRAMUSCULAR | Status: DC | PRN
  Filled 2017-03-21: qty 10

## 2017-03-21 MED ORDER — SODIUM CHLORIDE 0.9 % IV SOLN
1200.0000 mg | Freq: Once | INTRAVENOUS | Status: AC
  Administered 2017-03-21: 1200 mg via INTRAVENOUS
  Filled 2017-03-21: qty 20

## 2017-03-21 NOTE — Patient Instructions (Addendum)
[facility] Discharge Instructions for Patients Receiving Chemotherapy  Today you received the following chemotherapy agents: Atezolizumab (Tecentriq)  To help prevent nausea and vomiting after your treatment, we encourage you to take your nausea medication as prescribed.    If you develop nausea and vomiting that is not controlled by your nausea medication, call the clinic.   BELOW ARE SYMPTOMS THAT SHOULD BE REPORTED IMMEDIATELY:  *FEVER GREATER THAN 100.5 F  *CHILLS WITH OR WITHOUT FEVER  NAUSEA AND VOMITING THAT IS NOT CONTROLLED WITH YOUR NAUSEA MEDICATION  *UNUSUAL SHORTNESS OF BREATH  *UNUSUAL BRUISING OR BLEEDING  TENDERNESS IN MOUTH AND THROAT WITH OR WITHOUT PRESENCE OF ULCERS  *URINARY PROBLEMS  *BOWEL PROBLEMS  UNUSUAL RASH Items with * indicate a potential emergency and should be followed up as soon as possible.  Feel free to call the clinic should you have any questions or concerns. The clinic phone number is [phone].  Please show the CHEMO ALERT CARD at check-in to the Emergency Department and triage nurse.   

## 2017-03-21 NOTE — Patient Instructions (Signed)

## 2017-04-04 ENCOUNTER — Encounter (HOSPITAL_COMMUNITY)
Admission: RE | Admit: 2017-04-04 | Discharge: 2017-04-04 | Disposition: A | Discharge status: Home / Self Care | Payer: Medicare Other | Source: Ambulatory Visit | Attending: Hematology | Admitting: Hematology

## 2017-04-04 DIAGNOSIS — C7951 Secondary malignant neoplasm of bone: Secondary | ICD-10-CM | POA: Diagnosis not present

## 2017-04-04 DIAGNOSIS — C659 Malignant neoplasm of unspecified renal pelvis: Secondary | ICD-10-CM | POA: Insufficient documentation

## 2017-04-04 DIAGNOSIS — C787 Secondary malignant neoplasm of liver and intrahepatic bile duct: Secondary | ICD-10-CM | POA: Insufficient documentation

## 2017-04-04 DIAGNOSIS — C669 Malignant neoplasm of unspecified ureter: Secondary | ICD-10-CM | POA: Diagnosis not present

## 2017-04-04 LAB — GLUCOSE, CAPILLARY: GLUCOSE-CAPILLARY: 85 mg/dL (ref 65–99)

## 2017-04-04 MED ORDER — FLUDEOXYGLUCOSE F - 18 (FDG) INJECTION
6.3100 | Freq: Once | INTRAVENOUS | Status: AC | PRN
  Administered 2017-04-04: 6.31 via INTRAVENOUS

## 2017-04-11 ENCOUNTER — Other Ambulatory Visit: Payer: Medicare Other

## 2017-04-11 ENCOUNTER — Inpatient Hospital Stay: Payer: Medicare Other | Attending: Hematology

## 2017-04-11 ENCOUNTER — Ambulatory Visit: Payer: Medicare Other

## 2017-04-11 ENCOUNTER — Ambulatory Visit: Payer: Medicare Other | Admitting: Hematology

## 2017-04-11 ENCOUNTER — Inpatient Hospital Stay: Payer: Medicare Other

## 2017-04-11 ENCOUNTER — Encounter: Payer: Self-pay | Admitting: Hematology

## 2017-04-11 ENCOUNTER — Inpatient Hospital Stay: Payer: Medicare Other | Admitting: Hematology

## 2017-04-11 VITALS — BP 129/61 | HR 83 | Temp 97.5°F | Resp 18 | Ht 61.0 in | Wt 124.4 lb

## 2017-04-11 DIAGNOSIS — C778 Secondary and unspecified malignant neoplasm of lymph nodes of multiple regions: Secondary | ICD-10-CM | POA: Diagnosis not present

## 2017-04-11 DIAGNOSIS — C651 Malignant neoplasm of right renal pelvis: Secondary | ICD-10-CM | POA: Insufficient documentation

## 2017-04-11 DIAGNOSIS — I10 Essential (primary) hypertension: Secondary | ICD-10-CM | POA: Insufficient documentation

## 2017-04-11 DIAGNOSIS — C7951 Secondary malignant neoplasm of bone: Secondary | ICD-10-CM

## 2017-04-11 DIAGNOSIS — C659 Malignant neoplasm of unspecified renal pelvis: Secondary | ICD-10-CM

## 2017-04-11 DIAGNOSIS — K51211 Ulcerative (chronic) proctitis with rectal bleeding: Secondary | ICD-10-CM | POA: Diagnosis not present

## 2017-04-11 DIAGNOSIS — C787 Secondary malignant neoplasm of liver and intrahepatic bile duct: Secondary | ICD-10-CM | POA: Diagnosis not present

## 2017-04-11 DIAGNOSIS — Z5112 Encounter for antineoplastic immunotherapy: Secondary | ICD-10-CM | POA: Diagnosis not present

## 2017-04-11 DIAGNOSIS — C7800 Secondary malignant neoplasm of unspecified lung: Secondary | ICD-10-CM | POA: Diagnosis not present

## 2017-04-11 DIAGNOSIS — R05 Cough: Secondary | ICD-10-CM | POA: Diagnosis not present

## 2017-04-11 DIAGNOSIS — Z95828 Presence of other vascular implants and grafts: Secondary | ICD-10-CM

## 2017-04-11 LAB — COMPREHENSIVE METABOLIC PANEL
ALT: 7 U/L (ref 0–55)
AST: 20 U/L (ref 5–34)
Albumin: 3.2 g/dL — ABNORMAL LOW (ref 3.5–5.0)
Alkaline Phosphatase: 44 U/L (ref 40–150)
Anion gap: 9 (ref 3–11)
BUN: 14 mg/dL (ref 7–26)
CHLORIDE: 104 mmol/L (ref 98–109)
CO2: 25 mmol/L (ref 22–29)
CREATININE: 1.04 mg/dL (ref 0.60–1.10)
Calcium: 9.2 mg/dL (ref 8.4–10.4)
GFR calc Af Amer: 53 mL/min — ABNORMAL LOW (ref >60)
GFR, EST NON AFRICAN AMERICAN: 46 mL/min — AB (ref >60)
Glucose, Bld: 82 mg/dL (ref 70–140)
Potassium: 4 mmol/L (ref 3.3–4.7)
Sodium: 138 mmol/L (ref 136–145)
Total Bilirubin: 0.6 mg/dL (ref 0.2–1.2)
Total Protein: 7.7 g/dL (ref 6.4–8.3)

## 2017-04-11 LAB — CBC WITH DIFFERENTIAL (CANCER CENTER ONLY)
Basophils Absolute: 0 10*3/uL (ref 0.0–0.1)
Basophils Relative: 0 %
EOS ABS: 0 10*3/uL (ref 0.0–0.5)
EOS PCT: 1 %
HCT: 30.2 % — ABNORMAL LOW (ref 34.8–46.6)
Hemoglobin: 9.2 g/dL — ABNORMAL LOW (ref 11.6–15.9)
LYMPHS ABS: 2 10*3/uL (ref 0.9–3.3)
Lymphocytes Relative: 24 %
MCH: 27.1 pg (ref 25.1–34.0)
MCHC: 30.5 g/dL — AB (ref 31.5–36.0)
MCV: 89.1 fL (ref 79.5–101.0)
MONO ABS: 1.3 10*3/uL — AB (ref 0.1–0.9)
MONOS PCT: 16 %
Neutro Abs: 5 10*3/uL (ref 1.5–6.5)
Neutrophils Relative %: 59 %
PLATELETS: 293 10*3/uL (ref 145–400)
RBC: 3.39 MIL/uL — AB (ref 3.70–5.45)
RDW: 16.5 % — ABNORMAL HIGH (ref 11.2–16.1)
WBC: 8.3 10*3/uL (ref 3.9–10.3)

## 2017-04-11 LAB — RETICULOCYTES
RBC.: 3.39 MIL/uL — ABNORMAL LOW (ref 3.70–5.45)
RETIC CT PCT: 1.5 % (ref 0.7–2.1)
Retic Count, Absolute: 50.9 10*3/uL (ref 33.7–90.7)

## 2017-04-11 MED ORDER — HEPARIN SOD (PORK) LOCK FLUSH 100 UNIT/ML IV SOLN
500.0000 [IU] | Freq: Once | INTRAVENOUS | Status: AC | PRN
  Administered 2017-04-11: 500 [IU]
  Filled 2017-04-11: qty 5

## 2017-04-11 MED ORDER — SODIUM CHLORIDE 0.9 % IV SOLN
Freq: Once | INTRAVENOUS | Status: AC
  Administered 2017-04-11: 14:00:00 via INTRAVENOUS

## 2017-04-11 MED ORDER — SODIUM CHLORIDE 0.9 % IJ SOLN
10.0000 mL | INTRAMUSCULAR | Status: DC | PRN
  Administered 2017-04-11: 10 mL
  Filled 2017-04-11: qty 10

## 2017-04-11 MED ORDER — SODIUM CHLORIDE 0.9 % IJ SOLN
10.0000 mL | INTRAMUSCULAR | Status: DC | PRN
  Administered 2017-04-11: 10 mL via INTRAVENOUS
  Filled 2017-04-11: qty 10

## 2017-04-11 MED ORDER — SODIUM CHLORIDE 0.9 % IV SOLN
1200.0000 mg | Freq: Once | INTRAVENOUS | Status: AC
  Administered 2017-04-11: 1200 mg via INTRAVENOUS
  Filled 2017-04-11: qty 20

## 2017-04-11 NOTE — Patient Instructions (Signed)
[facility] Discharge Instructions for Patients Receiving Chemotherapy  Today you received the following chemotherapy agents: Tecentriq  To help prevent nausea and vomiting after your treatment, we encourage you to take your nausea medication as directed.   If you develop nausea and vomiting that is not controlled by your nausea medication, call the clinic.   BELOW ARE SYMPTOMS THAT SHOULD BE REPORTED IMMEDIATELY:  *FEVER GREATER THAN 100.5 F  *CHILLS WITH OR WITHOUT FEVER  NAUSEA AND VOMITING THAT IS NOT CONTROLLED WITH YOUR NAUSEA MEDICATION  *UNUSUAL SHORTNESS OF BREATH  *UNUSUAL BRUISING OR BLEEDING  TENDERNESS IN MOUTH AND THROAT WITH OR WITHOUT PRESENCE OF ULCERS  *URINARY PROBLEMS  *BOWEL PROBLEMS  UNUSUAL RASH Items with * indicate a potential emergency and should be followed up as soon as possible.  Feel free to call the clinic should you have any questions or concerns. The clinic phone number is [phone].  Please show the CHEMO ALERT CARD at check-in to the Emergency Department and triage nurse.   

## 2017-04-11 NOTE — Progress Notes (Signed)
Hematology oncology clinic follow-up.  Date of service. 04/11/2017   Patient Care Team: [name], MD as PCP - General (Internal Medicine)   Urologist: Dr. 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  Diagnosis:  Widely metastatic transitional cell carcinoma from the right renal pelvis.  Treatment -Atezolizumab IV q3weeks  Alphonse Guild for bone mets  HISTORY OF PRESENTING ILLNESS: Please see my previous notes for details of initial presentation.  INTERVAL HISTORY  Tiffany [name] is here for her scheduled follow-up and cycle 41 treatment. She had a great holiday and reports that she had her family over to her house. Since her last visit she has no new complaints. She had a PET scan on 04/04/2017.- which shows local progression of her renal pelvis mass but no other overt progression of metastatic disease.  She notes no flank or abdominal pain.   She takes the tessalon pearls with relief of her cough. She does still need to cough occasionally to get the mucous up. She states that she doesn't have very much energy.   On ROS, she denies diarrhea, rectal bleeding, abdominal pain, fever, chills, rash, flank pain, hematuria, difficulty urinating, shortness of breath, or any other associated symptoms. Pertinent positives are listed within the above HPI.  MEDICAL HISTORY:  Past Medical History:  Diagnosis Date  . Arthritis     knees 03-10-12 had Cortisone injection  . Cancer [facility]) june 2016   metastatic  . DDD (degenerative disc disease), cervical   . Edema leg    feet and ankles  . Esophagus disorder    Had esophagus stretched  . GERD (gastroesophageal reflux disease)    Benign stricture dilated in 2011  . Gout    Patient notes she has possible gout but has not been on chronic medications for this  . Headache(784.0)   . Hypertension   .  Occipital neuralgia   . Occipital neuralgia    Related to cervical degenerative disc disease  . Peptic ulcer disease    Previous history in the remote past  . Ulcerative proctitis ([place])   . Ulcerative proctitis ([place]) 2014    SURGICAL HISTORY: Past Surgical History:  Procedure Laterality Date  . ABDOMINAL HYSTERECTOMY    . APPENDECTOMY    . CHOLECYSTECTOMY    . COLONOSCOPY WITH PROPOFOL  03/25/2012   Procedure: COLONOSCOPY WITH PROPOFOL;  Surgeon: Garlan Fair, MD;  Location: [facility];  Service: Endoscopy;  Laterality: N/A;  . DILATION AND CURETTAGE OF UTERUS    . ESOPHAGEAL DILATION     For benign stricture in 2011  . ESOPHAGOGASTRODUODENOSCOPY    . FLEXIBLE SIGMOIDOSCOPY N/A 04/26/2014   Procedure: FLEXIBLE SIGMOIDOSCOPY - UnSedated;  Surgeon: Garlan Fair, MD;  Location: [facility];  Service: Endoscopy;  Laterality: N/A;  . FLEXIBLE SIGMOIDOSCOPY N/A 01/24/2015   Procedure: FLEXIBLE SIGNMOIDOSCOPY W/ FLEET ENEMIA;  Surgeon: Garlan Fair, MD;  Location: [facility];  Service: Endoscopy;  Laterality: N/A;  . FLEXIBLE SIGMOIDOSCOPY N/A 05/15/2016   Procedure: FLEXIBLE SIGMOIDOSCOPY;  Surgeon: Garlan Fair, MD;  Location: [facility];  Service: Endoscopy;  Laterality: N/A;  pt needs to ahve an enema upon arrival   . NECK SURGERY     20 years ago  . TOTAL ABDOMINAL HYSTERECTOMY W/ BILATERAL SALPINGOOPHORECTOMY     At age 75 due to miscarriage and retained products of conception causing significant uterine bleeding.  Patient has been on estrogen replacement therapy since then.    SOCIAL HISTORY: Social History   Socioeconomic History  . Marital status: Married    Spouse name: Not on file  . Number of children: Not on file  . Years of education: Not on file  . Highest education level: Not on file  Social Needs  . Financial resource strain: Not on file  . Food insecurity - worry: Not on file  . Food insecurity - inability: Not on file  . Transportation  needs - medical: Not on file  . Transportation needs - non-medical: Not on file  Occupational History  . Not on file  Tobacco Use  . Smoking status: Former Smoker    Last attempt to quit: 11/01/1983    Years since quitting: 33.4  . Smokeless tobacco: Never Used  Substance and Sexual Activity  . Alcohol use: Yes    Comment: occ  . Drug use: No  . Sexual activity: No  Other Topics Concern  . Not on file  Social History Narrative  . Not on file  Retired as Conservation officer, nature for [employer]. She is very well spoken and intelligent individual and exhibits a keen knowledge of her medical conditions.  FAMILY HISTORY: Family History  Problem Relation Age of Onset  . Lung cancer Sister   . Prostate cancer Brother   . Uterine cancer Maternal Aunt   . Breast cancer Paternal Grandmother   . Hodgkin's lymphoma Sister     ALLERGIES:  is allergic to nitrofurantoin; indomethacin; and lisinopril.  MEDICATIONS:  Current Outpatient Medications  Medication Sig Dispense Refill  . acetaminophen (TYLENOL) 650 MG CR tablet Take 1,300 mg by mouth 2 (two) times daily.    Marland Kitchen amLODipine (NORVASC) 5 MG tablet Take 5 mg by mouth daily.    . benzonatate (TESSALON) 100 MG capsule Take 1 capsule (100 mg total) 3 (three) times daily as needed by mouth for cough. 90 capsule 1  . budesonide (ENTOCORT EC) 3 MG 24 hr capsule Take 1 capsule (3 mg total) by mouth daily. followup with your GI doctor to optimize further management. 30 capsule 1  . COLCRYS 0.6 MG tablet Take 0.6 mg by mouth once daily as needed for gout  1  . estradiol (ESTRACE) 0.5 MG tablet Take 0.5 mg by mouth daily.  1  . guaiFENesin (MUCINEX) 600 MG 12 hr tablet Take 600 mg by mouth 2 (two) times daily.     Marland Kitchen Histamine Dihydrochloride (AUSTRALIAN DREAM ARTHRITIS EX) Apply 1 application topically 2 (two) times daily.    . mirtazapine (REMERON) 15 MG tablet Take 1 tablet (15 mg total) by mouth at bedtime. 90 tablet 1  . PRESCRIPTION  MEDICATION Chemo card    . Respiratory Therapy Supplies (FLUTTER) DEVI 1 each by Does not apply route daily. 1 each 0  . senna-docusate (SENNA S) 8.6-50 MG per tablet Take 2 tablets by mouth at bedtime. (Patient taking differently: Take 1-2 tablets by mouth daily as needed for mild constipation. ) 60 tablet 1  . triamcinolone ointment (KENALOG) 0.5 % Apply 1 application topically 2 (two) times daily. 30 g 0  . vitamin B-12 (CYANOCOBALAMIN) 100 MCG tablet Take 100 mcg by mouth daily.     No current facility-administered medications for this visit.    Facility-Administered Medications Ordered in Other Visits  Medication Dose Route Frequency Provider Last Rate Last Dose  . sodium chloride 0.9 % injection 10 mL  10 mL Intravenous PRN  Tiffany Genera, MD   10 mL at 02/28/17 9562   REVIEW OF SYSTEMS:    A 10+ POINT REVIEW OF SYSTEMS WAS OBTAINED including neurology, dermatology, psychiatry, cardiac, respiratory, lymph, extremities, GI, GU, Musculoskeletal, constitutional, breasts, reproductive, HEENT.  All pertinent positives are noted in the HPI.  All others are negative.   PHYSICAL EXAMINATION: ECOG PERFORMANCE STATUS: 1  Vitals:   04/11/17 1128  BP: 129/61  Pulse: 83  Resp: 18  Temp: (!) 97.5 F (36.4 C)  SpO2: 96%   Filed Weights   04/11/17 1128  Weight: 124 lb 6.4 oz (56.4 kg)     GENERAL elderly Caucasian female, alert, no distress and comfortable SKIN: resolved skin rashes EYES: normal, conjunctiva are pink and non-injected, sclera clear OROPHARYNX:no exudate, no erythema and lips, buccal mucosa, and tongue normal  NECK: supple, thyroid normal size, non-tender, without nodularity LYMPH:  no palpable lymphadenopathy in the cervical, axillary or inguinal LUNGS: few bibasilar rales, intermittent scattered rhonci, good air entry. HEART: regular rate & rhythm and no murmurs and 1+ bilateral lower extremity edema ABDOMEN:abdomen soft, non-tender and normal bowel  sounds Musculoskeletal:no cyanosis of digits and no clubbing  PSYCH: alert & oriented x 3 with fluent speech NEURO: no focal motor/sensory deficits  LABORATORY DATA:  Component     Latest Ref Rng & Units 04/11/2017  WBC Count     3.9 - 10.3 K/uL 8.3  RBC     3.70 - 5.45 MIL/uL 3.39 (L)  Hemoglobin     11.6 - 15.9 g/dL 9.2 (L)  HCT     34.8 - 46.6 % 30.2 (L)  MCV     79.5 - 101.0 fL 89.1  MCH     25.1 - 34.0 pg 27.1  MCHC     31.5 - 36.0 g/dL 30.5 (L)  RDW     11.2 - 16.1 % 16.5 (H)  Platelet Count     145 - 400 K/uL 293  Neutrophils     % 59  NEUT#     1.5 - 6.5 K/uL 5.0  Lymphocytes     % 24  Lymphocyte #     0.9 - 3.3 K/uL 2.0  Monocytes Relative     % 16  Monocyte #     0.1 - 0.9 K/uL 1.3 (H)  Eosinophil     % 1  Eosinophils Absolute     0.0 - 0.5 K/uL 0.0  Basophil     % 0  Basophils Absolute     0.0 - 0.1 K/uL 0.0  Sodium     136 - 145 mmol/L 138  Potassium     3.3 - 4.7 mmol/L 4.0  Chloride     98 - 109 mmol/L 104  CO2     22 - 29 mmol/L 25  Glucose     70 - 140 mg/dL 82  BUN     7 - 26 mg/dL 14  Creatinine     0.60 - 1.10 mg/dL 1.04  Calcium     8.4 - 10.4 mg/dL 9.2  Total Protein     6.4 - 8.3 g/dL 7.7  Albumin     3.5 - 5.0 g/dL 3.2 (L)  AST     5 - 34 U/L 20  ALT     0 - 55 U/L 7  Alkaline Phosphatase     40 - 150 U/L 44  Total Bilirubin     0.2 - 1.2 mg/dL 0.6  GFR, Est Non  African American     >60 mL/min 46 (L)  GFR, Est African American     >60 mL/min 53 (L)  Anion gap     3 - 11 9  Retic Ct Pct     0.7 - 2.1 % 1.5  RBC.     3.70 - 5.45 MIL/uL 3.39 (L)  Retic Count, Absolute     33.7 - 90.7 K/uL 50.9    CBC Latest Ref Rng & Units 04/11/2017 03/21/2017 02/28/2017  WBC 3.9 - 10.3 10e3/uL - 4.5 4.2  Hemoglobin 11.6 - 15.9 g/dL - 9.0(L) 9.7(L)  Hematocrit 34.8 - 46.6 % 30.2(L) 29.3(L) 31.9(L)  Platelets 145 - 400 10e3/uL - 311 246   . CMP Latest Ref Rng & Units 03/21/2017 02/28/2017 02/07/2017  Glucose 70 - 140  mg/dl 141(H) 134 180(H)  BUN 7.0 - 26.0 mg/dL 19.5 18.2 17.6  Creatinine 0.6 - 1.1 mg/dL 1.1 1.1 1.1  Sodium 136 - 145 mEq/L 139 139 140  Potassium 3.5 - 5.1 mEq/L 4.3 3.9 3.8  Chloride 101 - 111 mmol/L - - -  CO2 22 - 29 mEq/L 25 24 23   Calcium 8.4 - 10.4 mg/dL 9.4 9.4 9.2  Total Protein 6.4 - 8.3 g/dL 7.5 7.7 7.8  Total Bilirubin 0.20 - 1.20 mg/dL 0.32 0.30 0.28  Alkaline Phos 40 - 150 U/L 44 42 43  AST 5 - 34 U/L 13 18 16   ALT 0-55 U/L U/L <6 <6 <6    Radiology .Nm Pet Image Restag (ps) Skull Base To Thigh  Result Date: 04/04/2017 CLINICAL DATA:  Subsequent treatment strategy for restaging of renal/ ureteral transitional cell carcinoma. EXAM: NUCLEAR MEDICINE PET SKULL BASE TO THIGH TECHNIQUE: 6.31 mCi F-18 FDG was injected intravenously. Full-ring PET imaging was performed from the skull base to thigh after the radiotracer. CT data was obtained and used for attenuation correction and anatomic localization. FASTING BLOOD GLUCOSE:  Value: 85 mg/dl COMPARISON:  10/22/2016 PET FINDINGS: NECK: No areas of abnormal hypermetabolism. Cerebral atrophy is likely age-appropriate. Mucosal thickening left maxillary sinus. Bilateral carotid atherosclerosis. No cervical adenopathy. CHEST: No pulmonary parenchymal or thoracic nodal hypermetabolism identified. Mild cardiomegaly with coronary artery atherosclerosis. A right Port-A-Cath which terminates at the high right atrium. Lower lobe predominant bronchial wall thickening is likely post infectious or inflammatory. ABDOMEN/PELVIS: Hypermetabolic right renal mass measures 8.8 by 7.7 cm and a S.U.V. max of 28.0 today versus 7.3 x 6.1 cm and a S.U.V. max of 26 on the prior. This extends along the proximal right ureter, which is enlarged and hypermetabolic at 1.4 cm and a S.U.V. max of 32.9 today. This measured 8 mm on the prior. No separate areas of abdominopelvic nodal hypermetabolism identified. Cholecystectomy. Low-density upper pole left renal lesion is  likely a cyst. Hysterectomy. Pelvic floor laxity. SKELETON: No abnormal marrow activity. Right rotator cuff hypermetabolism is likely degenerative. No suspicious focal osseous lesion. Abdominal aortic atherosclerosis. IMPRESSION: 1. Interval enlargement of and increase in hypermetabolism within the right renal mass, consistent with residual/recurrent disease. Increase in disease progression along the proximal right ureter. 2. No evidence of abdominopelvic nodal or extra abdominal metastatic disease. 3. Coronary artery atherosclerosis. Aortic Atherosclerosis (ICD10-I70.0). Electronically Signed   By: Abigail Miyamoto M.D.   On: 04/04/2017 10:19    ASSESSMENT & PLAN:   84 year old Caucasian female with  #1 Right Renal pelvis metastatic transitional cell carcinoma.   Initial PET scan revealed metastases to the lungs, liver, multiple nodal stations and bones. -PET scan done after  8 cycles of treatment for restaging disease showed significant response to treatment with no evidence of active disease. Patient has no clinical evidence of disease progression at this time. CT chest 06/16/2015 showed no evidence of cancer progression. PET/CT scan done on 10/21/2015- showed no findings for metastatic disease. New right-sided hydronephrosis versus local tumor recurrence needs to be evaluated further. MRI of the kidney showed hyperenhancing the right renal pelvis lesion consistent with concern for local recurrence. -PET/CT 03/06/2016 with no findings of metastatic disease. Extensive right-sided hydronephrosis with high activity from excreted FDG obscuring any findings along the collecting system. -PET/CT  06/13/2016- Interval increase in size of right kidney. Findings are suspicious for progression of residual/recurrent urothelial carcinoma with further dilatation of the right renal collecting system. 2. No evidence for hypermetabolic metastases.  -PET/CT 10/22/2016 - Continued interval progression of the abnormal soft  tissue associated with the right kidney. This soft tissue remains markedly hypermetabolic and is compatible with continued progression of recurrent right renal disease. 2. No evidence for hypermetabolic metastases in the neck, chest, abdomen, or pelvis.  PET/CT 04/04/2017-  Interval enlargement of and increase in hypermetabolism within the right renal mass, consistent with residual/recurrent disease. Increase in disease progression along the proximal right ureter. 2. No evidence of abdominopelvic nodal or extra abdominal metastatic disease.  Plan  -Has been previously seen by radiation oncology with consideration for local radiation therapy if she develops symptomatic disease - her local progression in the rt renal pelvis is asymptomatic at this time -She is still mostly asymptomatic and responding well to treatment.  -continue Atezolizumab q3 weeks as per orders.  #2 h/o skin rash likely related to her Atezolizumab. Minimal grade 1 and topical triamcinolone. Currently quiescent.  #3 Cough and some clear secretions likely due to bronchiectasis. Followed by Dr Melvyn Novas. -No chest pain no increased shortness of breath or dyspnea on exertion  -Has an action plan with Dr. Melvyn Novas to use when necessary antibiotics for worsening symptoms. -Continue follow-up with Dr. 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 Wynetta Emery.  But stopped using it due to cost issues. Was then on PO budesonide which she has stopped as well. had recent sigmoidscopy with Dr Wynetta Emery - showed stable ulcerative proctitis  -will use po budesonide 3mg  po daily on as needed intermittent basis based on symptoms. Has not needed this for a few months at this time.  #5 history of hypertension   -on Amlodipine   Plan -Patient has no new treatment toxicities. No clinical symptoms  suggestive of overt disease recurrence/progression at this time. -Labs stable -PET/CT results reviewed with the patient in detauls -We'll continue her Atezolizumab q3weeks. -Continue Xgeva every12 weeks - (since patient has completed 2 years of treatment. Will switch Xgeva to q12 weeks from q6 weeks.)  RTC with Dr Irene Limbo with labs in 3 weeks Continue Atezolizumab as per orders q3weeks and Xgeva 12 weeks.   Tiffany Lone MD Tiffany Hematology/Oncology Physician [facility]  (Office):       [phone] (Work cell):  [phone] (Fax):           [phone]  This document serves as a record of services personally performed by Tiffany Lone, MD. It was created on his behalf by Tiffany [name], a trained medical scribe. The creation of this record is based on the scribe's personal observations and the  provider's statements to them.   .I have reviewed the above documentation for accuracy and completeness, and I agree with the above. Tiffany Genera MD Tiffany

## 2017-04-11 NOTE — Patient Instructions (Signed)
Implanted Port Home Guide An implanted port is a type of central line that is placed under the skin. Central lines are used to provide IV access when treatment or nutrition needs to be given through a person's veins. Implanted ports are used for long-term IV access. An implanted port may be placed because:  You need IV medicine that would be irritating to the small veins in your hands or arms.  You need long-term IV medicines, such as antibiotics.  You need IV nutrition for a long period.  You need frequent blood draws for lab tests.  You need dialysis.  Implanted ports are usually placed in the chest area, but they can also be placed in the upper arm, the abdomen, or the leg. An implanted port has two main parts:  Reservoir. The reservoir is round and will appear as a small, raised area under your skin. The reservoir is the part where a needle is inserted to give medicines or draw blood.  Catheter. The catheter is a thin, flexible tube that extends from the reservoir. The catheter is placed into a large vein. Medicine that is inserted into the reservoir goes into the catheter and then into the vein.  How will I care for my incision site? Do not get the incision site wet. Bathe or shower as directed by your health care provider. How is my port accessed? Special steps must be taken to access the port:  Before the port is accessed, a numbing cream can be placed on the skin. This helps numb the skin over the port site.  Your health care provider uses a sterile technique to access the port. ? Your health care provider must put on a mask and sterile gloves. ? The skin over your port is cleaned carefully with an antiseptic and allowed to dry. ? The port is gently pinched between sterile gloves, and a needle is inserted into the port.  Only "non-coring" port needles should be used to access the port. Once the port is accessed, a blood return should be checked. This helps ensure that the port  is in the vein and is not clogged.  If your port needs to remain accessed for a constant infusion, a clear (transparent) bandage will be placed over the needle site. The bandage and needle will need to be changed every week, or as directed by your health care provider.  Keep the bandage covering the needle clean and dry. Do not get it wet. Follow your health care provider's instructions on how to take a shower or bath while the port is accessed.  If your port does not need to stay accessed, no bandage is needed over the port.  What is flushing? Flushing helps keep the port from getting clogged. Follow your health care provider's instructions on how and when to flush the port. Ports are usually flushed with saline solution or a medicine called heparin. The need for flushing will depend on how the port is used.  If the port is used for intermittent medicines or blood draws, the port will need to be flushed: ? After medicines have been given. ? After blood has been drawn. ? As part of routine maintenance.  If a constant infusion is running, the port may not need to be flushed.  How long will my port stay implanted? The port can stay in for as long as your health care provider thinks it is needed. When it is time for the port to come out, surgery will be   done to remove it. The procedure is similar to the one performed when the port was put in. When should I seek immediate medical care? When you have an implanted port, you should seek immediate medical care if:  You notice a bad smell coming from the incision site.  You have swelling, redness, or drainage at the incision site.  You have more swelling or pain at the port site or the surrounding area.  You have a fever that is not controlled with medicine.  This information is not intended to replace advice given to you by your health care provider. Make sure you discuss any questions you have with your health care provider. Document  Released: 03/19/2005 Document Revised: 08/25/2015 Document Reviewed: 11/24/2012 Elsevier Interactive Patient Education  2017 Elsevier Inc.  

## 2017-05-01 NOTE — Progress Notes (Signed)
Hematology oncology clinic follow-up.  Date of service. 05/02/2017   Patient Care Team: [name], MD as PCP - General (Internal Medicine)   Urologist: Dr. 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  Diagnosis:  Widely metastatic transitional cell carcinoma from the right renal pelvis.  Treatment -Atezolizumab IV q3weeks  Alphonse Guild for bone mets  HISTORY OF PRESENTING ILLNESS: Please see my previous notes for details of initial presentation.  INTERVAL HISTORY  Tiffany [name] is here for her scheduled follow-up and cycle 42 treatment with Atezolizumab. Her last visit with [place] was on 04/11/17. She is accompanied today by her husband. The pt reports that she is doing well overall and looking forward to her birthday this coming Monday when she turns 84 yrs old. She reports that the tessalon pearls are working very well. .   Lab results today (05/02/17) of CBC, CMP, and Reticulocytes is as follows: all values are WNL except for RBC at 3.53, Hgb at 9.4 HCT at 31.5, MCHC at 29.8, RDW at 16.5 and Albumin at 3.1. Blood count and chemistries are stable.   On review of systems, pt reports a mild rash on her bilateral forearms controlled with topical steroids, eating well, and denies melena, nausea, abdominal pains, back pain, flank plain leg swelling.    MEDICAL HISTORY:  Past Medical History:  Diagnosis Date  . Arthritis     knees 03-10-12 had Cortisone injection  . Cancer [facility]) june 2016   metastatic  . DDD (degenerative disc disease), cervical   . Edema leg    feet and ankles  . Esophagus disorder    Had esophagus stretched  . GERD (gastroesophageal reflux disease)    Benign stricture dilated in 2011  . Gout    Patient notes she has possible gout but has not been on chronic medications for this  . Headache(784.0)   . Hypertension   .  Occipital neuralgia   . Occipital neuralgia    Related to cervical degenerative disc disease  . Peptic ulcer disease    Previous history in the remote past  . Ulcerative proctitis ([place])   . Ulcerative proctitis ([place]) 2014    SURGICAL HISTORY: Past Surgical History:  Procedure Laterality Date  . ABDOMINAL HYSTERECTOMY    . APPENDECTOMY    . CHOLECYSTECTOMY    . COLONOSCOPY WITH PROPOFOL  03/25/2012   Procedure: COLONOSCOPY WITH PROPOFOL;  Surgeon: Garlan Fair, MD;  Location: [facility];  Service: Endoscopy;  Laterality: N/A;  . DILATION AND CURETTAGE OF UTERUS    . ESOPHAGEAL DILATION     For benign stricture in 2011  . ESOPHAGOGASTRODUODENOSCOPY    . FLEXIBLE SIGMOIDOSCOPY N/A 04/26/2014   Procedure: FLEXIBLE SIGMOIDOSCOPY - UnSedated;  Surgeon: Garlan Fair, MD;  Location: [facility];  Service: Endoscopy;  Laterality: N/A;  . FLEXIBLE SIGMOIDOSCOPY N/A 01/24/2015   Procedure: FLEXIBLE SIGNMOIDOSCOPY W/ FLEET ENEMIA;  Surgeon: Garlan Fair, MD;  Location: [facility];  Service: Endoscopy;  Laterality: N/A;  . FLEXIBLE SIGMOIDOSCOPY N/A 05/15/2016   Procedure: FLEXIBLE SIGMOIDOSCOPY;  Surgeon: Garlan Fair, MD;  Location: [facility];  Service: Endoscopy;  Laterality: N/A;  pt needs to ahve an enema upon arrival   . NECK SURGERY     20 years ago  . TOTAL ABDOMINAL HYSTERECTOMY W/ BILATERAL SALPINGOOPHORECTOMY     At age 84 due to miscarriage and retained  products of conception causing significant uterine bleeding. Patient has been on estrogen replacement therapy since then.    SOCIAL HISTORY: Social History   Socioeconomic History  . Marital status: Married    Spouse name: Not on file  . Number of children: Not on file  . Years of education: Not on file  . Highest education level: Not on file  Social Needs  . Financial resource strain: Not on file  . Food insecurity - worry: Not on file  . Food insecurity - inability: Not on file  . Transportation  needs - medical: Not on file  . Transportation needs - non-medical: Not on file  Occupational History  . Not on file  Tobacco Use  . Smoking status: Former Smoker    Last attempt to quit: 11/01/1983    Years since quitting: 33.5  . Smokeless tobacco: Never Used  Substance and Sexual Activity  . Alcohol use: Yes    Comment: occ  . Drug use: No  . Sexual activity: No  Other Topics Concern  . Not on file  Social History Narrative  . Not on file  Retired as Conservation officer, nature for [employer]. She is very well spoken and intelligent individual and exhibits a keen knowledge of her medical conditions.  FAMILY HISTORY: Family History  Problem Relation Age of Onset  . Lung cancer Sister   . Prostate cancer Brother   . Uterine cancer Maternal Aunt   . Breast cancer Paternal Grandmother   . Hodgkin's lymphoma Sister     ALLERGIES:  is allergic to nitrofurantoin; indomethacin; and lisinopril.  MEDICATIONS:  Current Outpatient Medications  Medication Sig Dispense Refill  . acetaminophen (TYLENOL) 650 MG CR tablet Take 1,300 mg by mouth 2 (two) times daily.    Marland Kitchen amLODipine (NORVASC) 5 MG tablet Take 5 mg by mouth daily.    . benzonatate (TESSALON) 100 MG capsule Take 1 capsule (100 mg total) 3 (three) times daily as needed by mouth for cough. 90 capsule 1  . budesonide (ENTOCORT EC) 3 MG 24 hr capsule Take 1 capsule (3 mg total) by mouth daily. followup with your GI doctor to optimize further management. 30 capsule 1  . COLCRYS 0.6 MG tablet Take 0.6 mg by mouth once daily as needed for gout  1  . estradiol (ESTRACE) 0.5 MG tablet Take 0.5 mg by mouth daily.  1  . mirtazapine (REMERON) 15 MG tablet Take 1 tablet (15 mg total) by mouth at bedtime. 90 tablet 1  . PRESCRIPTION MEDICATION Chemo card    . Respiratory Therapy Supplies (FLUTTER) DEVI 1 each by Does not apply route daily. 1 each 0  . senna-docusate (SENNA S) 8.6-50 MG per tablet Take 2 tablets by mouth at bedtime.  (Patient taking differently: Take 1-2 tablets by mouth daily as needed for mild constipation. ) 60 tablet 1  . triamcinolone ointment (KENALOG) 0.5 % Apply 1 application topically 2 (two) times daily. 30 g 0  . vitamin B-12 (CYANOCOBALAMIN) 100 MCG tablet Take 100 mcg by mouth daily.    Marland Kitchen guaiFENesin (MUCINEX) 600 MG 12 hr tablet Take 600 mg by mouth 2 (two) times daily.      No current facility-administered medications for this visit.    Facility-Administered Medications Ordered in Other Visits  Medication Dose Route Frequency Provider Last Rate Last Dose  . sodium chloride 0.9 % injection 10 mL  10 mL Intravenous PRN Tiffany Genera, MD   10 mL at 02/28/17 [phone]  REVIEW OF SYSTEMS:    A 10+ POINT REVIEW OF SYSTEMS WAS OBTAINED including neurology, dermatology, psychiatry, cardiac, respiratory, lymph, extremities, GI, GU, Musculoskeletal, constitutional, breasts, reproductive, HEENT.  All pertinent positives are noted in the HPI.  All others are negative.   PHYSICAL EXAMINATION: ECOG PERFORMANCE STATUS: 1  Vitals:   05/02/17 0832  BP: (!) 142/59  Pulse: 88  Resp: 18  Temp: 98 F (36.7 C)  SpO2: 98%   Filed Weights   05/02/17 0832  Weight: 123 lb 14.4 oz (56.2 kg)     GENERAL elderly Caucasian female, alert, no distress and comfortable SKIN: minimal b/l upper extremity skin rashes EYES: normal, conjunctiva are pink and non-injected, sclera clear OROPHARYNX:no exudate, no erythema and lips, buccal mucosa, and tongue normal  NECK: supple, thyroid normal size, non-tender, without nodularity LYMPH:  no palpable lymphadenopathy in the cervical, axillary or inguinal LUNGS: few bibasilar rales, intermittent scattered rhonci, good air entry. HEART: Regular rate & rhythm and no murmurs and 1+ bilateral lower extremity edema ABDOMEN: abdomen soft, non-tender and normal bowel sounds Musculoskeletal: no cyanosis of digits and no clubbing  PSYCH: alert & oriented x 3 with fluent  speech NEURO: no focal motor/sensory deficits  LABORATORY DATA:   CBC Latest Ref Rng & Units 05/02/2017 04/11/2017 03/21/2017  WBC 3.9 - 10.3 K/uL 4.5 8.3 4.5  Hemoglobin 11.6 - 15.9 g/dL - - 9.0(L)  Hematocrit 34.8 - 46.6 % 31.5(L) 30.2(L) 29.3(L)  Platelets 145 - 400 K/uL 276 293 311   . CMP Latest Ref Rng & Units 05/02/2017 04/11/2017 03/21/2017  Glucose 70 - 140 mg/dL 121 82 141(H)  BUN 7 - 26 mg/dL 18 14 19.5  Creatinine 0.60 - 1.10 mg/dL 1.07 1.04 1.1  Sodium 136 - 145 mmol/L 140 138 139  Potassium 3.5 - 5.1 mmol/L 4.1 4.0 4.3  Chloride 98 - 109 mmol/L 106 104 -  CO2 22 - 29 mmol/L 24 25 25   Calcium 8.4 - 10.4 mg/dL 9.3 9.2 9.4  Total Protein 6.4 - 8.3 g/dL 7.4 7.7 7.5  Total Bilirubin 0.2 - 1.2 mg/dL 0.3 0.6 0.32  Alkaline Phos 40 - 150 U/L 41 44 44  AST 5 - 34 U/L 15 20 13   ALT 0 - 55 U/L <6 7 <6    Radiology .Nm Pet Image Restag (ps) Skull Base To Thigh  Result Date: 04/04/2017 CLINICAL DATA:  Subsequent treatment strategy for restaging of renal/ ureteral transitional cell carcinoma. EXAM: NUCLEAR MEDICINE PET SKULL BASE TO THIGH TECHNIQUE: 6.31 mCi F-18 FDG was injected intravenously. Full-ring PET imaging was performed from the skull base to thigh after the radiotracer. CT data was obtained and used for attenuation correction and anatomic localization. FASTING BLOOD GLUCOSE:  Value: 85 mg/dl COMPARISON:  10/22/2016 PET FINDINGS: NECK: No areas of abnormal hypermetabolism. Cerebral atrophy is likely age-appropriate. Mucosal thickening left maxillary sinus. Bilateral carotid atherosclerosis. No cervical adenopathy. CHEST: No pulmonary parenchymal or thoracic nodal hypermetabolism identified. Mild cardiomegaly with coronary artery atherosclerosis. A right Port-A-Cath which terminates at the high right atrium. Lower lobe predominant bronchial wall thickening is likely post infectious or inflammatory. ABDOMEN/PELVIS: Hypermetabolic right renal mass measures 8.8 by 7.7 cm and a S.U.V.  max of 28.0 today versus 7.3 x 6.1 cm and a S.U.V. max of 26 on the prior. This extends along the proximal right ureter, which is enlarged and hypermetabolic at 1.4 cm and a S.U.V. max of 32.9 today. This measured 8 mm on the prior. No separate areas of abdominopelvic  nodal hypermetabolism identified. Cholecystectomy. Low-density upper pole left renal lesion is likely a cyst. Hysterectomy. Pelvic floor laxity. SKELETON: No abnormal marrow activity. Right rotator cuff hypermetabolism is likely degenerative. No suspicious focal osseous lesion. Abdominal aortic atherosclerosis. IMPRESSION: 1. Interval enlargement of and increase in hypermetabolism within the right renal mass, consistent with residual/recurrent disease. Increase in disease progression along the proximal right ureter. 2. No evidence of abdominopelvic nodal or extra abdominal metastatic disease. 3. Coronary artery atherosclerosis. Aortic Atherosclerosis (ICD10-I70.0). Electronically Signed   By: Abigail Miyamoto M.D.   On: 04/04/2017 10:19    ASSESSMENT & PLAN:   84 year old Caucasian female with  #1 Right Renal pelvis metastatic transitional cell carcinoma.   Initial PET scan revealed metastases to the lungs, liver, multiple nodal stations and bones. -PET scan done after 8 cycles of treatment for restaging disease showed significant response to treatment with no evidence of active disease. Patient has no clinical evidence of disease progression at this time. CT chest 06/16/2015 showed no evidence of cancer progression. PET/CT scan done on 10/21/2015- showed no findings for metastatic disease. New right-sided hydronephrosis versus local tumor recurrence needs to be evaluated further. MRI of the kidney showed hyperenhancing the right renal pelvis lesion consistent with concern for local recurrence. -PET/CT 03/06/2016 with no findings of metastatic disease. Extensive right-sided hydronephrosis with high activity from excreted FDG obscuring any  findings along the collecting system. -PET/CT  06/13/2016- Interval increase in size of right kidney. Findings are suspicious for progression of residual/recurrent urothelial carcinoma with further dilatation of the right renal collecting system. 2. No evidence for hypermetabolic metastases.  -PET/CT 10/22/2016 - Continued interval progression of the abnormal soft tissue associated with the right kidney. This soft tissue remains markedly hypermetabolic and is compatible with continued progression of recurrent right renal disease. 2. No evidence for hypermetabolic metastases in the neck, chest, abdomen, or pelvis.  PET/CT 04/04/2017-  Interval enlargement of and increase in hypermetabolism within the right renal mass, consistent with residual/recurrent disease. Increase in disease progression along the proximal right ureter. 2. No evidence of abdominopelvic nodal or extra abdominal metastatic disease.  Plan  -Has been previously seen by radiation oncology with consideration for local radiation therapy if she develops symptomatic disease - her local progression in the rt renal pelvis is asymptomatic at this time -She is still mostly asymptomatic and responding well to treatment.  -continue Atezolizumab q3 weeks as per orders.  #2 h/o skin rash likely related to her Atezolizumab. Minimal grade 1 and controlled with topical triamcinolone. .  #3 Cough and some clear secretions likely due to bronchiectasis. Followed by Dr Melvyn Novas. -No chest pain no increased shortness of breath or dyspnea on exertion  -Has an action plan with Dr. Melvyn Novas to use when necessary antibiotics for worsening symptoms. -Continue follow-up with Dr. 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 Wynetta Emery.  But stopped using it due to cost issues. Was then on PO budesonide  which she has stopped as well. last sigmoidscopy with Dr Wynetta Emery - showed stable ulcerative proctitis  -will use po budesonide 3mg  po daily on as needed intermittent basis based on symptoms. Has not needed this for a few months at this time.  #5 history of hypertension   -on Amlodipine   Plan -Patient has no new treatment toxicities. No clinical symptoms suggestive of overt disease recurrence/progression  at this time. -Labs stable -Discussed pt labwork today -Since pt experiences relief from the tessalon pearls and is running out, we will refill that. -The pt also reports a mild worsening of her recurrent rash bilateral forearms, we will refill her medication for this.  -Her immunotherapy treatment continues to go well for her and we will continue as planned -PET/CT results reviewed with the patient in detail -We'll continue her Atezolizumab q3weeks. -Continue Xgeva every12 weeks - (since patient has completed 2 years of treatment. Will switch Xgeva to q12 weeks from q6 weeks.) -continue current treatment Atezolizumab q3weeks as per orders -continue Xgeva q12weeks RTC with Dr Irene Limbo in 3 weeks with labs   All of the patients questions were answered with apparent satisfaction. The patient knows to call the clinic with any problems, questions or concerns.  I spent 20 minutes counseling the patient face to face. The total time spent in the appointment was 25 minutes and more than 50% was on counseling and direct patient cares.   Sullivan Lone MD Tiffany Hematology/Oncology Physician [facility]  (Office):       [phone] (Work cell):  [phone] (Fax):           [phone]  This document serves as a record of services personally performed by Sullivan Lone, MD. It was created on his behalf by Baldwin Jamaica, a trained medical scribe. The creation of this record is based on the scribe's personal observations and the provider's statements to them.   .I have reviewed the above  documentation for accuracy and completeness, and I agree with the above. Tiffany Genera MD Tiffany

## 2017-05-02 ENCOUNTER — Inpatient Hospital Stay: Payer: Medicare Other | Admitting: Hematology

## 2017-05-02 ENCOUNTER — Inpatient Hospital Stay: Payer: Medicare Other

## 2017-05-02 ENCOUNTER — Ambulatory Visit: Payer: Medicare Other

## 2017-05-02 ENCOUNTER — Other Ambulatory Visit: Payer: Medicare Other

## 2017-05-02 ENCOUNTER — Encounter: Payer: Self-pay | Admitting: Hematology

## 2017-05-02 ENCOUNTER — Telehealth: Payer: Self-pay | Admitting: Hematology

## 2017-05-02 VITALS — BP 142/59 | HR 88 | Temp 98.0°F | Resp 18 | Ht 61.0 in | Wt 123.9 lb

## 2017-05-02 DIAGNOSIS — C7951 Secondary malignant neoplasm of bone: Secondary | ICD-10-CM

## 2017-05-02 DIAGNOSIS — I10 Essential (primary) hypertension: Secondary | ICD-10-CM

## 2017-05-02 DIAGNOSIS — R05 Cough: Secondary | ICD-10-CM

## 2017-05-02 DIAGNOSIS — C651 Malignant neoplasm of right renal pelvis: Secondary | ICD-10-CM | POA: Diagnosis not present

## 2017-05-02 DIAGNOSIS — C7800 Secondary malignant neoplasm of unspecified lung: Secondary | ICD-10-CM | POA: Diagnosis not present

## 2017-05-02 DIAGNOSIS — C778 Secondary and unspecified malignant neoplasm of lymph nodes of multiple regions: Secondary | ICD-10-CM

## 2017-05-02 DIAGNOSIS — C787 Secondary malignant neoplasm of liver and intrahepatic bile duct: Secondary | ICD-10-CM

## 2017-05-02 DIAGNOSIS — Z95828 Presence of other vascular implants and grafts: Secondary | ICD-10-CM

## 2017-05-02 DIAGNOSIS — C659 Malignant neoplasm of unspecified renal pelvis: Secondary | ICD-10-CM

## 2017-05-02 DIAGNOSIS — Z5112 Encounter for antineoplastic immunotherapy: Secondary | ICD-10-CM | POA: Diagnosis not present

## 2017-05-02 DIAGNOSIS — K51211 Ulcerative (chronic) proctitis with rectal bleeding: Secondary | ICD-10-CM | POA: Diagnosis not present

## 2017-05-02 DIAGNOSIS — J219 Acute bronchiolitis, unspecified: Secondary | ICD-10-CM

## 2017-05-02 LAB — COMPREHENSIVE METABOLIC PANEL
ALBUMIN: 3.1 g/dL — AB (ref 3.5–5.0)
AST: 15 U/L (ref 5–34)
Alkaline Phosphatase: 41 U/L (ref 40–150)
Anion gap: 10 (ref 3–11)
BUN: 18 mg/dL (ref 7–26)
CHLORIDE: 106 mmol/L (ref 98–109)
CO2: 24 mmol/L (ref 22–29)
CREATININE: 1.07 mg/dL (ref 0.60–1.10)
Calcium: 9.3 mg/dL (ref 8.4–10.4)
GFR calc non Af Amer: 44 mL/min — ABNORMAL LOW (ref >60)
GFR, EST AFRICAN AMERICAN: 51 mL/min — AB (ref >60)
Glucose, Bld: 121 mg/dL (ref 70–140)
Potassium: 4.1 mmol/L (ref 3.5–5.1)
SODIUM: 140 mmol/L (ref 136–145)
Total Bilirubin: 0.3 mg/dL (ref 0.2–1.2)
Total Protein: 7.4 g/dL (ref 6.4–8.3)

## 2017-05-02 LAB — CBC WITH DIFFERENTIAL (CANCER CENTER ONLY)
BASOS PCT: 0 %
Basophils Absolute: 0 10*3/uL (ref 0.0–0.1)
EOS PCT: 1 %
Eosinophils Absolute: 0 10*3/uL (ref 0.0–0.5)
HCT: 31.5 % — ABNORMAL LOW (ref 34.8–46.6)
HEMOGLOBIN: 9.4 g/dL — AB (ref 11.6–15.9)
LYMPHS ABS: 1.4 10*3/uL (ref 0.9–3.3)
Lymphocytes Relative: 31 %
MCH: 26.6 pg (ref 25.1–34.0)
MCHC: 29.8 g/dL — AB (ref 31.5–36.0)
MCV: 89.2 fL (ref 79.5–101.0)
MONOS PCT: 18 %
Monocytes Absolute: 0.8 10*3/uL (ref 0.1–0.9)
NEUTROS PCT: 50 %
Neutro Abs: 2.2 10*3/uL (ref 1.5–6.5)
PLATELETS: 276 10*3/uL (ref 145–400)
RBC: 3.53 MIL/uL — AB (ref 3.70–5.45)
RDW: 16.5 % — ABNORMAL HIGH (ref 11.2–14.5)
WBC: 4.5 10*3/uL (ref 3.9–10.3)

## 2017-05-02 LAB — RETICULOCYTES
RBC.: 3.53 MIL/uL — AB (ref 3.70–5.45)
RETIC CT PCT: 1.7 % (ref 0.7–2.1)
Retic Count, Absolute: 60 10*3/uL (ref 33.7–90.7)

## 2017-05-02 MED ORDER — HEPARIN SOD (PORK) LOCK FLUSH 100 UNIT/ML IV SOLN
500.0000 [IU] | Freq: Once | INTRAVENOUS | Status: AC | PRN
  Administered 2017-05-02: 500 [IU]
  Filled 2017-05-02: qty 5

## 2017-05-02 MED ORDER — SODIUM CHLORIDE 0.9 % IV SOLN
Freq: Once | INTRAVENOUS | Status: AC
  Administered 2017-05-02: 10:00:00 via INTRAVENOUS

## 2017-05-02 MED ORDER — SODIUM CHLORIDE 0.9 % IJ SOLN
10.0000 mL | INTRAMUSCULAR | Status: DC | PRN
  Administered 2017-05-02: 10 mL
  Filled 2017-05-02: qty 10

## 2017-05-02 MED ORDER — SODIUM CHLORIDE 0.9 % IV SOLN
1200.0000 mg | Freq: Once | INTRAVENOUS | Status: AC
  Administered 2017-05-02: 1200 mg via INTRAVENOUS
  Filled 2017-05-02: qty 20

## 2017-05-02 MED ORDER — SODIUM CHLORIDE 0.9 % IJ SOLN
10.0000 mL | INTRAMUSCULAR | Status: DC | PRN
  Administered 2017-05-02: 10 mL via INTRAVENOUS
  Filled 2017-05-02: qty 10

## 2017-05-02 MED ORDER — DENOSUMAB 120 MG/1.7ML ~~LOC~~ SOLN
120.0000 mg | Freq: Once | SUBCUTANEOUS | Status: AC
  Administered 2017-05-02: 120 mg via SUBCUTANEOUS

## 2017-05-02 NOTE — Patient Instructions (Signed)
[facility] Discharge Instructions for Patients Receiving Chemotherapy  Today you received the following chemotherapy agents: Tecentriq  To help prevent nausea and vomiting after your treatment, we encourage you to take your nausea medication as directed.   If you develop nausea and vomiting that is not controlled by your nausea medication, call the clinic.   BELOW ARE SYMPTOMS THAT SHOULD BE REPORTED IMMEDIATELY:  *FEVER GREATER THAN 100.5 F  *CHILLS WITH OR WITHOUT FEVER  NAUSEA AND VOMITING THAT IS NOT CONTROLLED WITH YOUR NAUSEA MEDICATION  *UNUSUAL SHORTNESS OF BREATH  *UNUSUAL BRUISING OR BLEEDING  TENDERNESS IN MOUTH AND THROAT WITH OR WITHOUT PRESENCE OF ULCERS  *URINARY PROBLEMS  *BOWEL PROBLEMS  UNUSUAL RASH Items with * indicate a potential emergency and should be followed up as soon as possible.  Feel free to call the clinic should you have any questions or concerns. The clinic phone number is [phone].  Please show the CHEMO ALERT CARD at check-in to the Emergency Department and triage nurse.   

## 2017-05-02 NOTE — Telephone Encounter (Signed)
Scheduled appt per 1/31 los - Gave patient AVS and calender per los. 

## 2017-05-03 ENCOUNTER — Other Ambulatory Visit: Payer: Self-pay

## 2017-05-03 ENCOUNTER — Other Ambulatory Visit: Payer: Self-pay | Admitting: Hematology

## 2017-05-03 MED ORDER — BENZONATATE 100 MG PO CAPS: 100.0000 mg | ORAL_CAPSULE | Freq: Three times a day (TID) | ORAL | 2 refills | Status: DC | PRN

## 2017-05-03 MED ORDER — BENZONATATE 100 MG PO CAPS: 100.0000 mg | ORAL_CAPSULE | Freq: Three times a day (TID) | ORAL | 1 refills | Status: DC | PRN

## 2017-05-03 MED ORDER — TRIAMCINOLONE ACETONIDE 0.5 % EX OINT: 1.0000 "application " | TOPICAL_OINTMENT | Freq: Two times a day (BID) | CUTANEOUS | 0 refills | Status: DC

## 2017-05-22 NOTE — Progress Notes (Signed)
Hematology oncology clinic follow-up.  Date of service. 05/23/2017   Patient Care Team: [name], MD as PCP - General (Internal Medicine)   Urologist: Dr. 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  Treatment -Atezolizumab IV q3weeks  Delton See q12weeks for bone mets  HISTORY OF PRESENTING ILLNESS: Please see my previous notes for details of initial presentation.  INTERVAL HISTORY:   Tiffany [name] is here for her scheduled follow-up and cycle 42 treatment with Atezolizumab. The patient's last visit with [place] was on 05/02/17. She is accompanied today by her husband. The pt reports that she is doing well overall.  She notes persistent coughing but uses her Tessalon pearls. She notes that she has Senna S and uses it prn. She notes that she is following up with an orthopedist but continued pain on the outside base of her right ankle. She notes that the ankle was noted to be okay but the pain is arise on the outside pain of her foot.  Lab results today (05/23/17) of CBC, CMP, and Reticulocytes is as follows: all values are WNL except for RBC at 3.43, Hgb at 9.1, HCT at 30.1, MCHC at 30.2, RDW at 17.1, Glucose at 158, Creatinine at 1.18, Albumin at 3.1.  On review of systems, pt reports constipation, and denies bleeding, diarrhea, abdominal pains, back pains, painful urination, blood in the urine, flank pain, abdominal pain, leg swelling, and any other symptoms.    MEDICAL HISTORY:  Past Medical History:  Diagnosis Date  . Arthritis     knees 03-10-12 had Cortisone injection  . Cancer [facility]) june 2016   metastatic  . DDD (degenerative disc disease), cervical   . Edema leg    feet and ankles  . Esophagus disorder    Had esophagus stretched  . GERD (gastroesophageal reflux  disease)    Benign stricture dilated in 2011  . Gout    Patient notes she has possible gout but has not been on chronic medications for this  . Headache(784.0)   . Hypertension   . Occipital neuralgia   . Occipital neuralgia    Related to cervical degenerative disc disease  . Peptic ulcer disease    Previous history in the remote past  . Ulcerative proctitis ([place])   . Ulcerative proctitis ([place]) 2014    SURGICAL HISTORY: Past Surgical History:  Procedure Laterality Date  . ABDOMINAL HYSTERECTOMY    . APPENDECTOMY    . CHOLECYSTECTOMY    . COLONOSCOPY WITH PROPOFOL  03/25/2012   Procedure: COLONOSCOPY WITH PROPOFOL;  Surgeon: Garlan Fair, MD;  Location: [facility];  Service: Endoscopy;  Laterality: N/A;  . DILATION AND CURETTAGE OF UTERUS    . ESOPHAGEAL DILATION     For benign stricture in 2011  . ESOPHAGOGASTRODUODENOSCOPY    . FLEXIBLE SIGMOIDOSCOPY N/A 04/26/2014   Procedure: FLEXIBLE SIGMOIDOSCOPY - UnSedated;  Surgeon: Garlan Fair, MD;  Location: [facility];  Service: Endoscopy;  Laterality: N/A;  . FLEXIBLE SIGMOIDOSCOPY N/A 01/24/2015   Procedure: FLEXIBLE SIGNMOIDOSCOPY W/ FLEET ENEMIA;  Surgeon: Garlan Fair, MD;  Location: [facility];  Service: Endoscopy;  Laterality: N/A;  . FLEXIBLE SIGMOIDOSCOPY N/A 05/15/2016   Procedure: FLEXIBLE SIGMOIDOSCOPY;  Surgeon: Garlan Fair, MD;  Location: [facility];  Service: Endoscopy;  Laterality: N/A;  pt needs to ahve an  enema upon arrival   . NECK SURGERY     20 years ago  . TOTAL ABDOMINAL HYSTERECTOMY W/ BILATERAL SALPINGOOPHORECTOMY     At age 89 due to miscarriage and retained products of conception causing significant uterine bleeding. Patient has been on estrogen replacement therapy since then.    SOCIAL HISTORY: Social History   Socioeconomic History  . Marital status: Married    Spouse name: Not on file  . Number of children: Not on file  . Years of education: Not on file  . Highest  education level: Not on file  Social Needs  . Financial resource strain: Not on file  . Food insecurity - worry: Not on file  . Food insecurity - inability: Not on file  . Transportation needs - medical: Not on file  . Transportation needs - non-medical: Not on file  Occupational History  . Not on file  Tobacco Use  . Smoking status: Former Smoker    Last attempt to quit: 11/01/1983    Years since quitting: 33.5  . Smokeless tobacco: Never Used  Substance and Sexual Activity  . Alcohol use: Yes    Comment: occ  . Drug use: No  . Sexual activity: No  Other Topics Concern  . Not on file  Social History Narrative  . Not on file  Retired as Conservation officer, nature for [employer]. She is very well spoken and intelligent individual and exhibits a keen knowledge of her medical conditions.  FAMILY HISTORY: Family History  Problem Relation Age of Onset  . Lung cancer Sister   . Prostate cancer Brother   . Uterine cancer Maternal Aunt   . Breast cancer Paternal Grandmother   . Hodgkin's lymphoma Sister     ALLERGIES:  is allergic to nitrofurantoin; indomethacin; and lisinopril.  MEDICATIONS:  Current Outpatient Medications  Medication Sig Dispense Refill  . acetaminophen (TYLENOL) 650 MG CR tablet Take 1,300 mg by mouth 2 (two) times daily.    Marland Kitchen amLODipine (NORVASC) 5 MG tablet Take 5 mg by mouth daily.    . benzonatate (TESSALON) 100 MG capsule Take 1 capsule (100 mg total) by mouth 3 (three) times daily as needed for cough. 90 capsule 2  . budesonide (ENTOCORT EC) 3 MG 24 hr capsule Take 1 capsule (3 mg total) by mouth daily. followup with your GI doctor to optimize further management. 30 capsule 1  . COLCRYS 0.6 MG tablet Take 0.6 mg by mouth once daily as needed for gout  1  . estradiol (ESTRACE) 0.5 MG tablet Take 0.5 mg by mouth daily.  1  . guaiFENesin (MUCINEX) 600 MG 12 hr tablet Take 600 mg by mouth 2 (two) times daily.     . mirtazapine (REMERON) 15 MG tablet  Take 1 tablet (15 mg total) by mouth at bedtime. 90 tablet 1  . PRESCRIPTION MEDICATION Chemo card    . Respiratory Therapy Supplies (FLUTTER) DEVI 1 each by Does not apply route daily. 1 each 0  . senna-docusate (SENNA S) 8.6-50 MG per tablet Take 2 tablets by mouth at bedtime. (Patient taking differently: Take 1-2 tablets by mouth daily as needed for mild constipation. ) 60 tablet 1  . triamcinolone ointment (KENALOG) 0.5 % Apply 1 application topically 2 (two) times daily. 30 g 0  . vitamin B-12 (CYANOCOBALAMIN) 100 MCG tablet Take 100 mcg by mouth daily.     No current facility-administered medications for this visit.    Facility-Administered Medications Ordered in Other Visits  Medication Dose Route Frequency Provider Last Rate Last Dose  . sodium chloride 0.9 % injection 10 mL  10 mL Intravenous PRN [name], MD   10 mL at 02/28/17 1602   REVIEW OF SYSTEMS:    .10 Point review of Systems was done is negative except as noted above.    PHYSICAL EXAMINATION: ECOG PERFORMANCE STATUS: 1  Vitals:   05/23/17 1500  BP: (!) 150/71  Pulse: 89  Resp: 18  Temp: 98 F (36.7 C)  SpO2: 97%   Filed Weights   05/23/17 1500  Weight: 126 lb 8 oz (57.4 kg)  . GENERAL:alert, in no acute distress and comfortable SKIN: no acute rashes, no significant lesions EYES: conjunctiva are pink and non-injected, sclera anicteric OROPHARYNX: MMM, no exudates, no oropharyngeal erythema or ulceration NECK: supple, no JVD LYMPH:  no palpable lymphadenopathy in the cervical, axillary or inguinal regions LUNGS: b/l scattered rhonci, few basal rales HEART: regular rate & rhythm ABDOMEN:  normoactive bowel sounds , non tender, not distended. Extremity: no pedal edema PSYCH: alert & oriented x 3 with fluent speech NEURO: no focal motor/sensory deficits  LABORATORY DATA:   CBC Latest Ref Rng & Units 05/23/2017 05/02/2017 04/11/2017  WBC 3.9 - 10.3 K/uL 4.8 4.5 8.3  Hemoglobin 11.6 - 15.9  g/dL - - -  Hematocrit 34.8 - 46.6 % 30.1(L) 31.5(L) 30.2(L)  Platelets 145 - 400 K/uL 271 276 293   . CMP Latest Ref Rng & Units 05/23/2017 05/02/2017 04/11/2017  Glucose 70 - 140 mg/dL 158(H) 121 82  BUN 7 - 26 mg/dL 19 18 14   Creatinine 0.60 - 1.10 mg/dL 1.18(H) 1.07 1.04  Sodium 136 - 145 mmol/L 137 140 138  Potassium 3.5 - 5.1 mmol/L 4.1 4.1 4.0  Chloride 98 - 109 mmol/L 102 106 104  CO2 22 - 29 mmol/L 25 24 25   Calcium 8.4 - 10.4 mg/dL 9.5 9.3 9.2  Total Protein 6.4 - 8.3 g/dL 7.6 7.4 7.7  Total Bilirubin 0.2 - 1.2 mg/dL 0.3 0.3 0.6  Alkaline Phos 40 - 150 U/L 45 41 44  AST 5 - 34 U/L 17 15 20   ALT 0 - 55 U/L 7 <6 7   . Lab Results  Component Value Date   LDH 228 05/11/2016    Radiology .No results found.  ASSESSMENT & PLAN:   84 year old Caucasian female with  #1 Right Renal pelvis metastatic transitional cell carcinoma.   Initial PET scan revealed metastases to the lungs, liver, multiple nodal stations and bones. -PET scan done after 8 cycles of treatment for restaging disease showed significant response to treatment with no evidence of active disease. Patient has no clinical evidence of disease progression at this time. CT chest 06/16/2015 showed no evidence of cancer progression. PET/CT scan done on 10/21/2015- showed no findings for metastatic disease. New right-sided hydronephrosis versus local tumor recurrence needs to be evaluated further. MRI of the kidney showed hyperenhancing the right renal pelvis lesion consistent with concern for local recurrence. -PET/CT 03/06/2016 with no findings of metastatic disease. Extensive right-sided hydronephrosis with high activity from excreted FDG obscuring any findings along the collecting system. -PET/CT  06/13/2016- Interval increase in size of right kidney. Findings are suspicious for progression of residual/recurrent urothelial carcinoma with further dilatation of the right renal collecting system. 2. No evidence for  hypermetabolic metastases.  -PET/CT 10/22/2016 - Continued interval progression of the abnormal soft tissue associated with the right kidney. This soft tissue remains markedly hypermetabolic and is compatible  with continued progression of recurrent right renal disease. 2. No evidence for hypermetabolic metastases in the neck, chest, abdomen, or pelvis.  PET/CT 04/04/2017-  Interval enlargement of and increase in hypermetabolism within the right renal mass, consistent with residual/recurrent disease. Increase in disease progression along the proximal right ureter. 2. No evidence of abdominopelvic nodal or extra abdominal metastatic disease.   #2 Bone metastases  Plan --Patient has no new treatment toxicities. No clinical symptoms suggestive of overt disease recurrence/progression at this time.  -Her immunotherapy treatment continues to go well for her and we will continue as planned -Has been previously seen by radiation oncology with consideration for local radiation therapy if she develops symptomatic disease - her local progression in the rt renal pelvis is asymptomatic at this time. No other evidence of progressive metastatic disease. -She is still mostly asymptomatic and responding well to treatment.  -continue Atezolizumab q3 weeks as per orders. -will switch Xgeva to q12 weeks since she has completed 2 yrs of this treatment already.  #2 h/o skin rash likely related to her Atezolizumab. Minimal grade 1 and controlled with topical triamcinolone. .  #3 Cough and some clear secretions likely due to bronchiectasis. Followed by Dr Melvyn Novas. -No chest pain no increased shortness of breath or dyspnea on exertion  -Has an action plan with Dr. Melvyn Novas to use when necessary antibiotics for worsening symptoms. -Continue follow-up with Dr. 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 Wynetta Emery.  But stopped using it due to cost issues. Was then on PO budesonide which she has stopped as well. last sigmoidscopy with Dr Wynetta Emery - showed stable ulcerative proctitis  -will use po budesonide 3mg  po daily on as needed intermittent basis based on symptoms. Has not needed this for a few months at this time.  #5 history of hypertension   -on Amlodipine   Continue Atezolizumab q3weeks Please change Xgeva scheduling to every 12weeks (previously every 6 weeks) RTC with Dr Irene Limbo with labs with every treatment    All of the patients questions were answered with apparent satisfaction. The patient knows to call the clinic with any problems, questions or concerns.  . The total time spent in the appointment was 15 minutes and more than 50% was on counseling and direct patient cares.     Sullivan Lone MD Tiffany Hematology/Oncology Physician [facility]  (Office):       [phone] (Work cell):  [phone] (Fax):           [phone]  This document serves as a record of services personally performed by Sullivan Lone, MD. It was created on his behalf by Baldwin Jamaica, a trained medical scribe. The creation of this record is based on the scribe's personal observations and the provider's statements to them.   .I have reviewed the above documentation for accuracy and completeness, and I agree with the above. [name] MD Tiffany

## 2017-05-23 ENCOUNTER — Inpatient Hospital Stay: Payer: Medicare Other

## 2017-05-23 ENCOUNTER — Inpatient Hospital Stay: Payer: Medicare Other | Admitting: Hematology

## 2017-05-23 ENCOUNTER — Ambulatory Visit: Payer: Medicare Other

## 2017-05-23 ENCOUNTER — Inpatient Hospital Stay: Payer: Medicare Other | Attending: Hematology

## 2017-05-23 ENCOUNTER — Other Ambulatory Visit: Payer: Medicare Other

## 2017-05-23 VITALS — BP 150/71 | HR 89 | Temp 98.0°F | Resp 18 | Ht 61.0 in | Wt 126.5 lb

## 2017-05-23 DIAGNOSIS — R05 Cough: Secondary | ICD-10-CM | POA: Insufficient documentation

## 2017-05-23 DIAGNOSIS — C7800 Secondary malignant neoplasm of unspecified lung: Secondary | ICD-10-CM | POA: Diagnosis not present

## 2017-05-23 DIAGNOSIS — C787 Secondary malignant neoplasm of liver and intrahepatic bile duct: Secondary | ICD-10-CM | POA: Diagnosis not present

## 2017-05-23 DIAGNOSIS — C651 Malignant neoplasm of right renal pelvis: Secondary | ICD-10-CM

## 2017-05-23 DIAGNOSIS — Z79899 Other long term (current) drug therapy: Secondary | ICD-10-CM | POA: Diagnosis not present

## 2017-05-23 DIAGNOSIS — C7951 Secondary malignant neoplasm of bone: Secondary | ICD-10-CM | POA: Insufficient documentation

## 2017-05-23 DIAGNOSIS — C659 Malignant neoplasm of unspecified renal pelvis: Secondary | ICD-10-CM

## 2017-05-23 DIAGNOSIS — K5900 Constipation, unspecified: Secondary | ICD-10-CM | POA: Insufficient documentation

## 2017-05-23 DIAGNOSIS — Z95828 Presence of other vascular implants and grafts: Secondary | ICD-10-CM

## 2017-05-23 DIAGNOSIS — Z5112 Encounter for antineoplastic immunotherapy: Secondary | ICD-10-CM | POA: Insufficient documentation

## 2017-05-23 LAB — CMP (CANCER CENTER ONLY)
ALK PHOS: 45 U/L (ref 40–150)
ALT: 7 U/L (ref 0–55)
AST: 17 U/L (ref 5–34)
Albumin: 3.1 g/dL — ABNORMAL LOW (ref 3.5–5.0)
Anion gap: 10 (ref 3–11)
BILIRUBIN TOTAL: 0.3 mg/dL (ref 0.2–1.2)
BUN: 19 mg/dL (ref 7–26)
CO2: 25 mmol/L (ref 22–29)
CREATININE: 1.18 mg/dL — AB (ref 0.60–1.10)
Calcium: 9.5 mg/dL (ref 8.4–10.4)
Chloride: 102 mmol/L (ref 98–109)
GFR, EST NON AFRICAN AMERICAN: 39 mL/min — AB (ref >60)
GFR, Est AFR Am: 45 mL/min — ABNORMAL LOW (ref >60)
Glucose, Bld: 158 mg/dL — ABNORMAL HIGH (ref 70–140)
Potassium: 4.1 mmol/L (ref 3.5–5.1)
Sodium: 137 mmol/L (ref 136–145)
TOTAL PROTEIN: 7.6 g/dL (ref 6.4–8.3)

## 2017-05-23 LAB — CBC WITH DIFFERENTIAL (CANCER CENTER ONLY)
BASOS ABS: 0 10*3/uL (ref 0.0–0.1)
BASOS PCT: 0 %
EOS PCT: 2 %
Eosinophils Absolute: 0.1 10*3/uL (ref 0.0–0.5)
HCT: 30.1 % — ABNORMAL LOW (ref 34.8–46.6)
Hemoglobin: 9.1 g/dL — ABNORMAL LOW (ref 11.6–15.9)
Lymphocytes Relative: 33 %
Lymphs Abs: 1.6 10*3/uL (ref 0.9–3.3)
MCH: 26.5 pg (ref 25.1–34.0)
MCHC: 30.2 g/dL — AB (ref 31.5–36.0)
MCV: 87.8 fL (ref 79.5–101.0)
Monocytes Absolute: 0.6 10*3/uL (ref 0.1–0.9)
Monocytes Relative: 12 %
Neutro Abs: 2.6 10*3/uL (ref 1.5–6.5)
Neutrophils Relative %: 53 %
PLATELETS: 271 10*3/uL (ref 145–400)
RBC: 3.43 MIL/uL — ABNORMAL LOW (ref 3.70–5.45)
RDW: 17.1 % — AB (ref 11.2–14.5)
WBC Count: 4.8 10*3/uL (ref 3.9–10.3)

## 2017-05-23 LAB — RETICULOCYTES
RBC.: 3.43 MIL/uL — AB (ref 3.70–5.45)
RETIC COUNT ABSOLUTE: 51.5 10*3/uL (ref 33.7–90.7)
Retic Ct Pct: 1.5 % (ref 0.7–2.1)

## 2017-05-23 LAB — TSH: TSH: 0.863 u[IU]/mL (ref 0.308–3.960)

## 2017-05-23 MED ORDER — SODIUM CHLORIDE 0.9 % IJ SOLN
10.0000 mL | INTRAMUSCULAR | Status: DC | PRN
  Administered 2017-05-23: 10 mL via INTRAVENOUS
  Filled 2017-05-23: qty 10

## 2017-05-23 MED ORDER — SODIUM CHLORIDE 0.9 % IJ SOLN
10.0000 mL | INTRAMUSCULAR | Status: DC | PRN
  Administered 2017-05-23: 10 mL
  Filled 2017-05-23: qty 10

## 2017-05-23 MED ORDER — HEPARIN SOD (PORK) LOCK FLUSH 100 UNIT/ML IV SOLN
500.0000 [IU] | Freq: Once | INTRAVENOUS | Status: AC | PRN
  Administered 2017-05-23: 500 [IU]
  Filled 2017-05-23: qty 5

## 2017-05-23 MED ORDER — SODIUM CHLORIDE 0.9 % IV SOLN
1200.0000 mg | Freq: Once | INTRAVENOUS | Status: AC
  Administered 2017-05-23: 1200 mg via INTRAVENOUS
  Filled 2017-05-23: qty 20

## 2017-05-23 MED ORDER — SODIUM CHLORIDE 0.9 % IV SOLN
Freq: Once | INTRAVENOUS | Status: AC
  Administered 2017-05-23: 16:00:00 via INTRAVENOUS

## 2017-05-23 NOTE — Patient Instructions (Signed)
[facility] Discharge Instructions for Patients Receiving Chemotherapy  Today you received the following chemotherapy agents: Tecentriq  To help prevent nausea and vomiting after your treatment, we encourage you to take your nausea medication as directed.   If you develop nausea and vomiting that is not controlled by your nausea medication, call the clinic.   BELOW ARE SYMPTOMS THAT SHOULD BE REPORTED IMMEDIATELY:  *FEVER GREATER THAN 100.5 F  *CHILLS WITH OR WITHOUT FEVER  NAUSEA AND VOMITING THAT IS NOT CONTROLLED WITH YOUR NAUSEA MEDICATION  *UNUSUAL SHORTNESS OF BREATH  *UNUSUAL BRUISING OR BLEEDING  TENDERNESS IN MOUTH AND THROAT WITH OR WITHOUT PRESENCE OF ULCERS  *URINARY PROBLEMS  *BOWEL PROBLEMS  UNUSUAL RASH Items with * indicate a potential emergency and should be followed up as soon as possible.  Feel free to call the clinic should you have any questions or concerns. The clinic phone number is [phone].  Please show the CHEMO ALERT CARD at check-in to the Emergency Department and triage nurse.   

## 2017-05-28 ENCOUNTER — Telehealth: Payer: Self-pay

## 2017-05-28 NOTE — Telephone Encounter (Signed)
Called patient and mailed a letter of upcoming appointment changes. On 2/26 for 4/25 scheduled appointment date. Per 2/22 los

## 2017-06-10 NOTE — Progress Notes (Signed)
Hematology oncology clinic follow-up.  Date of service. 06/13/2017   Patient Care Team: [name], MD as PCP - General (Internal Medicine)   Urologist: Dr. 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  Treatment -Atezolizumab IV q3weeks  Delton See q12weeks for bone mets  HISTORY OF PRESENTING ILLNESS: Please see my previous notes for details of initial presentation.  INTERVAL HISTORY:   Tiffany [name] is here for her scheduled follow-up and cycle 44 treatment with Atezolizumab. The patient's last visit with [place] was on 05/23/17. She is accompanied today by her husband. The pt reports that she is doing well overall.   The pt reports that she uses Ensure between meals. She denies any rectal bleeding. She notes using a suppository after feeling constipated recently. She takes Senna S, 2 tablets every day. She notes that Remeron is helping her sleep well. She notes that her cough has been stable overall and is helped by using  treats it with a vaporizer. She notes that she still gets occasional rashes but that they resolve with ointment application.   Lab results today (06/13/17) of CBC, CMP, and Reticulocytes is as follows: all values are WNL except for RBC at 3.39, Hgb at 8.8, HCT at 29.7, MCHC at 29.6, RDW at 16.7, Monocytes Abs at 1.0k, Albumin at 2.9, Alk Phos at 38.  On review of systems, pt reports stable cough, mild constipation, eating well, and denies rectal bleeding, flank pain, abdominal pain, blood in the stools, bone pains, leg swelling, and any other symptoms.   MEDICAL HISTORY:  Past Medical History:  Diagnosis Date  . Arthritis     knees 03-10-12 had Cortisone injection  . Cancer [facility]) june 2016   metastatic  . DDD (degenerative disc disease), cervical   . Edema  leg    feet and ankles  . Esophagus disorder    Had esophagus stretched  . GERD (gastroesophageal reflux disease)    Benign stricture dilated in 2011  . Gout    Patient notes she has possible gout but has not been on chronic medications for this  . Headache(784.0)   . Hypertension   . Occipital neuralgia   . Occipital neuralgia    Related to cervical degenerative disc disease  . Peptic ulcer disease    Previous history in the remote past  . Ulcerative proctitis ([place])   . Ulcerative proctitis ([place]) 2014    SURGICAL HISTORY: Past Surgical History:  Procedure Laterality Date  . ABDOMINAL HYSTERECTOMY    . APPENDECTOMY    . CHOLECYSTECTOMY    . COLONOSCOPY WITH PROPOFOL  03/25/2012   Procedure: COLONOSCOPY WITH PROPOFOL;  Surgeon: Garlan Fair, MD;  Location: [facility];  Service: Endoscopy;  Laterality: N/A;  . DILATION AND CURETTAGE OF UTERUS    . ESOPHAGEAL DILATION     For benign stricture in 2011  . ESOPHAGOGASTRODUODENOSCOPY    . FLEXIBLE SIGMOIDOSCOPY N/A 04/26/2014   Procedure: FLEXIBLE SIGMOIDOSCOPY - UnSedated;  Surgeon: Garlan Fair, MD;  Location: [facility];  Service: Endoscopy;  Laterality: N/A;  . FLEXIBLE SIGMOIDOSCOPY N/A 01/24/2015   Procedure: FLEXIBLE SIGNMOIDOSCOPY W/ FLEET ENEMIA;  Surgeon: Garlan Fair, MD;  Location: [facility];  Service: Endoscopy;  Laterality: N/A;  . FLEXIBLE SIGMOIDOSCOPY N/A 05/15/2016   Procedure: FLEXIBLE SIGMOIDOSCOPY;  Surgeon: Garlan Fair, MD;  Location: [facility];  Service: Endoscopy;  Laterality: N/A;  pt needs to ahve an enema upon arrival   . NECK SURGERY     20 years ago  . TOTAL ABDOMINAL HYSTERECTOMY W/ BILATERAL SALPINGOOPHORECTOMY     At age 46 due to miscarriage and retained products of conception causing significant uterine bleeding. Patient has been on estrogen replacement therapy since then.    SOCIAL HISTORY: Social History   Socioeconomic History  . Marital status: Married    Spouse  name: Not on file  . Number of children: Not on file  . Years of education: Not on file  . Highest education level: Not on file  Social Needs  . Financial resource strain: Not on file  . Food insecurity - worry: Not on file  . Food insecurity - inability: Not on file  . Transportation needs - medical: Not on file  . Transportation needs - non-medical: Not on file  Occupational History  . Not on file  Tobacco Use  . Smoking status: Former Smoker    Last attempt to quit: 11/01/1983    Years since quitting: 33.6  . Smokeless tobacco: Never Used  Substance and Sexual Activity  . Alcohol use: Yes    Comment: occ  . Drug use: No  . Sexual activity: No  Other Topics Concern  . Not on file  Social History Narrative  . Not on file  Retired as Conservation officer, nature for [employer]. She is very well spoken and intelligent individual and exhibits a keen knowledge of her medical conditions.  FAMILY HISTORY: Family History  Problem Relation Age of Onset  . Lung cancer Sister   . Prostate cancer Brother   . Uterine cancer Maternal Aunt   . Breast cancer Paternal Grandmother   . Hodgkin's lymphoma Sister     ALLERGIES:  is allergic to nitrofurantoin; indomethacin; and lisinopril.  MEDICATIONS:  Current Outpatient Medications  Medication Sig Dispense Refill  . acetaminophen (TYLENOL) 650 MG CR tablet Take 1,300 mg by mouth 2 (two) times daily.    Marland Kitchen amLODipine (NORVASC) 5 MG tablet Take 5 mg by mouth daily.    . benzonatate (TESSALON) 100 MG capsule Take 1 capsule (100 mg total) by mouth 3 (three) times daily as needed for cough. 90 capsule 2  . budesonide (ENTOCORT EC) 3 MG 24 hr capsule Take 1 capsule (3 mg total) by mouth daily. followup with your GI doctor to optimize further management. 30 capsule 1  . COLCRYS 0.6 MG tablet Take 0.6 mg by mouth once daily as needed for gout  1  . estradiol (ESTRACE) 0.5 MG tablet Take 0.5 mg by mouth daily.  1  . guaiFENesin (MUCINEX) 600  MG 12 hr tablet Take 600 mg by mouth 2 (two) times daily.     . mirtazapine (REMERON) 15 MG tablet Take 1 tablet (15 mg total) by mouth at bedtime. 90 tablet 1  . PRESCRIPTION MEDICATION Chemo card    . Respiratory Therapy Supplies (FLUTTER) DEVI 1 each by Does not apply route daily. 1 each 0  . senna-docusate (SENNA S) 8.6-50 MG per tablet Take 2 tablets by mouth at bedtime. (Patient taking differently: Take 1-2 tablets by mouth daily as needed for mild constipation. ) 60 tablet 1  . triamcinolone ointment (KENALOG) 0.5 % Apply 1 application topically 2 (two) times daily. 30 g 0  . vitamin B-12 (CYANOCOBALAMIN) 100 MCG tablet Take 100 mcg by mouth daily.     No  current facility-administered medications for this visit.    Facility-Administered Medications Ordered in Other Visits  Medication Dose Route Frequency Provider Last Rate Last Dose  . sodium chloride 0.9 % injection 10 mL  10 mL Intravenous PRN [name], MD   10 mL at 02/28/17 1602   REVIEW OF SYSTEMS:    .10 Point review of Systems was done is negative except as noted above.  PHYSICAL EXAMINATION: ECOG PERFORMANCE STATUS: 1  Vitals:   06/13/17 0858  BP: (!) 126/58  Pulse: 88  Resp: 18  Temp: 98.2 F (36.8 C)  SpO2: 97%   Filed Weights   06/13/17 0858  Weight: 125 lb (56.7 kg)   GENERAL:alert, in no acute distress and comfortable SKIN: no acute rashes, no significant lesions EYES: conjunctiva are pink and non-injected, sclera anicteric OROPHARYNX: MMM, no exudates, no oropharyngeal erythema or ulceration NECK: supple, no JVD LYMPH:  no palpable lymphadenopathy in the cervical, axillary or inguinal regions LUNGS: clear to auscultation b/l with normal respiratory effort HEART: regular rate & rhythm ABDOMEN:  normoactive bowel sounds , non tender, not distended. Extremity: no pedal edema PSYCH: alert & oriented x 3 with fluent speech NEURO: no focal motor/sensory deficits    LABORATORY DATA:   CBC  Latest Ref Rng & Units 06/13/2017 05/23/2017 05/02/2017  WBC 3.9 - 10.3 K/uL 5.3 4.8 4.5  Hemoglobin 11.6 - 15.9 g/dL - - -  Hematocrit 34.8 - 46.6 % 29.7(L) 30.1(L) 31.5(L)  Platelets 145 - 400 K/uL 262 271 276  HGB 8.8  CMP Latest Ref Rng & Units 06/13/2017 05/23/2017 05/02/2017  Glucose 70 - 140 mg/dL 139 158(H) 121  BUN 7 - 26 mg/dL 18 19 18   Creatinine 0.60 - 1.10 mg/dL 1.00 1.18(H) 1.07  Sodium 136 - 145 mmol/L 138 137 140  Potassium 3.5 - 5.1 mmol/L 4.2 4.1 4.1  Chloride 98 - 109 mmol/L 105 102 106  CO2 22 - 29 mmol/L 24 25 24   Calcium 8.4 - 10.4 mg/dL 9.5 9.5 9.3  Total Protein 6.4 - 8.3 g/dL 7.2 7.6 7.4  Total Bilirubin 0.2 - 1.2 mg/dL 0.3 0.3 0.3  Alkaline Phos 40 - 150 U/L 38(L) 45 41  AST 5 - 34 U/L 14 17 15   ALT 0 - 55 U/L 7 7 <6   . Lab Results  Component Value Date   LDH 228 05/11/2016    Radiology .No results found.  ASSESSMENT & PLAN:   84 year old Caucasian female with  #1 Right Renal pelvis metastatic transitional cell carcinoma.   Initial PET scan revealed metastases to the lungs, liver, multiple nodal stations and bones. -PET scan done after 8 cycles of treatment for restaging disease showed significant response to treatment with no evidence of active disease. Patient has no clinical evidence of disease progression at this time. CT chest 06/16/2015 showed no evidence of cancer progression. PET/CT scan done on 10/21/2015- showed no findings for metastatic disease. New right-sided hydronephrosis versus local tumor recurrence needs to be evaluated further. MRI of the kidney showed hyperenhancing the right renal pelvis lesion consistent with concern for local recurrence. -PET/CT 03/06/2016 with no findings of metastatic disease. Extensive right-sided hydronephrosis with high activity from excreted FDG obscuring any findings along the collecting system. -PET/CT  06/13/2016- Interval increase in size of right kidney. Findings are suspicious for progression of  residual/recurrent urothelial carcinoma with further dilatation of the right renal collecting system. 2. No evidence for hypermetabolic metastases.  -PET/CT 10/22/2016 - Continued interval progression of the  abnormal soft tissue associated with the right kidney. This soft tissue remains markedly hypermetabolic and is compatible with continued progression of recurrent right renal disease. 2. No evidence for hypermetabolic metastases in the neck, chest, abdomen, or pelvis.  PET/CT 04/04/2017-  Interval enlargement of and increase in hypermetabolism within the right renal mass, consistent with residual/recurrent disease. Increase in disease progression along the proximal right ureter. 2. No evidence of abdominopelvic nodal or extra abdominal metastatic disease.   #2 Bone metastases  Plan -labs reviewed. Mild anemia - not significantly symptomatic. -No prohibitive toxicities from treatment nor clinical symptoms of overt disease recurrence/progression.  -Her immunotherapy treatment continues to go well for her and we will continue as planned -Has been previously seen by radiation oncology with consideration for local radiation therapy if she develops symptomatic disease - her local progression in the rt renal pelvis is asymptomatic at this time. No other evidence of progressive metastatic disease. -She is still mostly asymptomatic and responding well to treatment.  -continue Atezolizumab q3 weeks as per orders. -continue Xgeva to q12 weeks since she has completed 2 yrs of this treatment already. -transfuse prn for hgb <8 -We will continue to monitor her Hgb and Albumin, and encouraged her to continue eating well.    #2 h/o skin rash likely related to her Atezolizumab. Minimal grade 1 and controlled with topical triamcinolone. Unchanged today.  #3 chronic bronchiectasis with Cough and some clear secretions likely due to bronchiectasis. Followed by Dr Melvyn Novas. -No chest pain no increased shortness of breath  or dyspnea on exertion  -Has an action plan with Dr. Melvyn Novas to use when necessary antibiotics for worsening symptoms. -Continue follow-up with Dr. 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 Wynetta Emery.  But stopped using it due to cost issues. Was then on PO budesonide which she has stopped as well. last sigmoidscopy with Dr Wynetta Emery - showed stable ulcerative proctitis  -will use po budesonide 75m po daily on as needed intermittent basis based on symptoms. Has not needed this for a few months at this time. -transfuse prn for hgb<8. Does not report overt rectal bleeding at this time.  #5 history of hypertension   -on Amlodipine   -continue Xgeva q12weeks -Continue Atezolizumab q3weeks -f/u for clinic visits as per currently scheduled appointments (labs q3weeks)   All of the patients questions were answered with apparent satisfaction. The patient knows to call the clinic with any problems, questions or concerns.  . The total time spent in the appointment was 20 minutes and more than 50% was on counseling and direct patient cares.     GSullivan LoneMD Tiffany Hematology/Oncology Physician [facility] (Office):       [phone](Work cell):  [phone](Fax):           [phone] This document serves as a record of services personally performed by GSullivan Lone MD. It was created on his behalf by SBaldwin [place] a trained medical scribe. The creation of this record is based on the scribe's personal observations and the provider's statements to them.   .I have reviewed the above documentation for accuracy and completeness, and I agree with the above. .Brunetta GeneraMD Tiffany

## 2017-06-13 ENCOUNTER — Other Ambulatory Visit: Payer: Medicare Other

## 2017-06-13 ENCOUNTER — Inpatient Hospital Stay: Payer: Medicare Other

## 2017-06-13 ENCOUNTER — Inpatient Hospital Stay: Payer: Medicare Other | Admitting: Hematology

## 2017-06-13 ENCOUNTER — Inpatient Hospital Stay: Payer: Medicare Other | Attending: Hematology

## 2017-06-13 ENCOUNTER — Encounter: Payer: Self-pay | Admitting: Hematology

## 2017-06-13 VITALS — BP 126/58 | HR 88 | Temp 98.2°F | Resp 18 | Ht 61.0 in | Wt 125.0 lb

## 2017-06-13 DIAGNOSIS — C651 Malignant neoplasm of right renal pelvis: Secondary | ICD-10-CM

## 2017-06-13 DIAGNOSIS — D649 Anemia, unspecified: Secondary | ICD-10-CM | POA: Insufficient documentation

## 2017-06-13 DIAGNOSIS — Z5112 Encounter for antineoplastic immunotherapy: Secondary | ICD-10-CM | POA: Insufficient documentation

## 2017-06-13 DIAGNOSIS — C787 Secondary malignant neoplasm of liver and intrahepatic bile duct: Secondary | ICD-10-CM | POA: Diagnosis not present

## 2017-06-13 DIAGNOSIS — C659 Malignant neoplasm of unspecified renal pelvis: Secondary | ICD-10-CM

## 2017-06-13 DIAGNOSIS — C7800 Secondary malignant neoplasm of unspecified lung: Secondary | ICD-10-CM | POA: Insufficient documentation

## 2017-06-13 DIAGNOSIS — Z95828 Presence of other vascular implants and grafts: Secondary | ICD-10-CM

## 2017-06-13 DIAGNOSIS — C7951 Secondary malignant neoplasm of bone: Secondary | ICD-10-CM | POA: Diagnosis not present

## 2017-06-13 LAB — CBC WITH DIFFERENTIAL (CANCER CENTER ONLY)
BASOS PCT: 0 %
Basophils Absolute: 0 10*3/uL (ref 0.0–0.1)
Eosinophils Absolute: 0.1 10*3/uL (ref 0.0–0.5)
Eosinophils Relative: 3 %
HEMATOCRIT: 29.7 % — AB (ref 34.8–46.6)
HEMOGLOBIN: 8.8 g/dL — AB (ref 11.6–15.9)
LYMPHS ABS: 1.4 10*3/uL (ref 0.9–3.3)
LYMPHS PCT: 26 %
MCH: 26 pg (ref 25.1–34.0)
MCHC: 29.6 g/dL — AB (ref 31.5–36.0)
MCV: 87.6 fL (ref 79.5–101.0)
MONO ABS: 1 10*3/uL — AB (ref 0.1–0.9)
MONOS PCT: 20 %
NEUTROS ABS: 2.8 10*3/uL (ref 1.5–6.5)
NEUTROS PCT: 51 %
Platelet Count: 262 10*3/uL (ref 145–400)
RBC: 3.39 MIL/uL — ABNORMAL LOW (ref 3.70–5.45)
RDW: 16.7 % — AB (ref 11.2–14.5)
WBC Count: 5.3 10*3/uL (ref 3.9–10.3)

## 2017-06-13 LAB — CMP (CANCER CENTER ONLY)
ALBUMIN: 2.9 g/dL — AB (ref 3.5–5.0)
ALK PHOS: 38 U/L — AB (ref 40–150)
ALT: 7 U/L (ref 0–55)
ANION GAP: 9 (ref 3–11)
AST: 14 U/L (ref 5–34)
BUN: 18 mg/dL (ref 7–26)
CALCIUM: 9.5 mg/dL (ref 8.4–10.4)
CO2: 24 mmol/L (ref 22–29)
Chloride: 105 mmol/L (ref 98–109)
Creatinine: 1 mg/dL (ref 0.60–1.10)
GFR, EST AFRICAN AMERICAN: 55 mL/min — AB (ref >60)
GFR, Estimated: 48 mL/min — ABNORMAL LOW (ref >60)
GLUCOSE: 139 mg/dL (ref 70–140)
Potassium: 4.2 mmol/L (ref 3.5–5.1)
Sodium: 138 mmol/L (ref 136–145)
Total Bilirubin: 0.3 mg/dL (ref 0.2–1.2)
Total Protein: 7.2 g/dL (ref 6.4–8.3)

## 2017-06-13 LAB — RETICULOCYTES
RBC.: 3.39 MIL/uL — AB (ref 3.70–5.45)
Retic Count, Absolute: 50.9 10*3/uL (ref 33.7–90.7)
Retic Ct Pct: 1.5 % (ref 0.7–2.1)

## 2017-06-13 MED ORDER — SODIUM CHLORIDE 0.9 % IV SOLN
Freq: Once | INTRAVENOUS | Status: AC
  Administered 2017-06-13: 10:00:00 via INTRAVENOUS

## 2017-06-13 MED ORDER — HEPARIN SOD (PORK) LOCK FLUSH 100 UNIT/ML IV SOLN
500.0000 [IU] | Freq: Once | INTRAVENOUS | Status: AC | PRN
  Administered 2017-06-13: 500 [IU]
  Filled 2017-06-13: qty 5

## 2017-06-13 MED ORDER — SODIUM CHLORIDE 0.9 % IJ SOLN
10.0000 mL | INTRAMUSCULAR | Status: DC | PRN
  Administered 2017-06-13: 10 mL via INTRAVENOUS
  Filled 2017-06-13: qty 10

## 2017-06-13 MED ORDER — SODIUM CHLORIDE 0.9 % IJ SOLN
10.0000 mL | INTRAMUSCULAR | Status: DC | PRN
  Administered 2017-06-13: 10 mL
  Filled 2017-06-13: qty 10

## 2017-06-13 MED ORDER — SODIUM CHLORIDE 0.9 % IV SOLN
1200.0000 mg | Freq: Once | INTRAVENOUS | Status: AC
  Administered 2017-06-13: 1200 mg via INTRAVENOUS
  Filled 2017-06-13: qty 20

## 2017-06-13 NOTE — Patient Instructions (Signed)
[facility] Discharge Instructions for Patients Receiving Chemotherapy  Today you received the following chemotherapy agent: Tecentriq  To help prevent nausea and vomiting after your treatment, we encourage you to take your nausea medication as directed.   If you develop nausea and vomiting that is not controlled by your nausea medication, call the clinic.   BELOW ARE SYMPTOMS THAT SHOULD BE REPORTED IMMEDIATELY:  *FEVER GREATER THAN 100.5 F  *CHILLS WITH OR WITHOUT FEVER  NAUSEA AND VOMITING THAT IS NOT CONTROLLED WITH YOUR NAUSEA MEDICATION  *UNUSUAL SHORTNESS OF BREATH  *UNUSUAL BRUISING OR BLEEDING  TENDERNESS IN MOUTH AND THROAT WITH OR WITHOUT PRESENCE OF ULCERS  *URINARY PROBLEMS  *BOWEL PROBLEMS  UNUSUAL RASH Items with * indicate a potential emergency and should be followed up as soon as possible.  Feel free to call the clinic should you have any questions or concerns. The clinic phone number is [phone].  Please show the CHEMO ALERT CARD at check-in to the Emergency Department and triage nurse.   

## 2017-06-20 ENCOUNTER — Other Ambulatory Visit: Payer: Self-pay | Admitting: Hematology

## 2017-07-03 NOTE — Progress Notes (Signed)
Hematology oncology clinic follow-up.  Date of service. 07/04/17  Patient Care Team: Tiffany Carol, MD as PCP - General (Internal Medicine)   Urologist: Dr. 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  Treatment -Atezolizumab IV q3weeks  Delton See q12weeks for bone mets  HISTORY OF PRESENTING ILLNESS: Please see my previous notes for details of initial presentation.  INTERVAL HISTORY:   [name] is here for her scheduled follow-up and cycle 45 treatment with Atezolizumab. The patient's last visit with [place] was on 06/13/17. She is accompanied today by her husband. The pt reports that she is doing well overall.   The pt reports that she has had a little blood in the stools, but there is no blood in the toilet water. She has not been taking her Budesonide prn and would like a refill. This was refilled.  She notes that she has had less energy, and has had a couple bouts of light headedness but denies this being very consistent, nor any accompanied dizziness.   Lab results today (07/04/17) of CBC, CMP, and Reticulocytes is as follows: all values are WNL except for RBC at 3.34, Hgb at 8.7, HCT at 29.0, MCHC at 30.0, RDW at 17.5, Monocytes Abs at 1.8k, Albumin at 2.8.  On review of systems, pt reports blood in the stools, occasional light headedness, decrease in energy, and denies dizziness, blood in the urine, abdominal pain, and any other symptoms.   MEDICAL HISTORY:  Past Medical History:  Diagnosis Date  . Arthritis     knees 03-10-12 had Cortisone injection  . Cancer [facility]) june 2016   metastatic  . DDD (degenerative disc disease), cervical   . Edema leg    feet and ankles  . Esophagus disorder    Had esophagus stretched  . GERD (gastroesophageal reflux disease)    Benign  stricture dilated in 2011  . Gout    Patient notes she has possible gout but has not been on chronic medications for this  . Headache(784.0)   . Hypertension   . Occipital neuralgia   . Occipital neuralgia    Related to cervical degenerative disc disease  . Peptic ulcer disease    Previous history in the remote past  . Ulcerative proctitis ([place])   . Ulcerative proctitis ([place]) 2014    SURGICAL HISTORY: Past Surgical History:  Procedure Laterality Date  . ABDOMINAL HYSTERECTOMY    . APPENDECTOMY    . CHOLECYSTECTOMY    . COLONOSCOPY WITH PROPOFOL  03/25/2012   Procedure: COLONOSCOPY WITH PROPOFOL;  Surgeon: Tiffany Fair, MD;  Location: [facility];  Service: Endoscopy;  Laterality: N/A;  . DILATION AND CURETTAGE OF UTERUS    . ESOPHAGEAL DILATION     For benign stricture in 2011  . ESOPHAGOGASTRODUODENOSCOPY    . FLEXIBLE SIGMOIDOSCOPY N/A 04/26/2014   Procedure: FLEXIBLE SIGMOIDOSCOPY - UnSedated;  Surgeon: Tiffany Fair, MD;  Location: [facility];  Service: Endoscopy;  Laterality: N/A;  . FLEXIBLE SIGMOIDOSCOPY N/A 01/24/2015   Procedure: FLEXIBLE SIGNMOIDOSCOPY W/ FLEET ENEMIA;  Surgeon: Tiffany Fair, MD;  Location: [facility];  Service: Endoscopy;  Laterality: N/A;  . FLEXIBLE SIGMOIDOSCOPY N/A 05/15/2016   Procedure: FLEXIBLE SIGMOIDOSCOPY;  Surgeon: Tiffany Fair, MD;  Location: [facility];  Service: Endoscopy;  Laterality: N/A;  pt needs to ahve an enema  upon arrival   . NECK SURGERY     20 years ago  . TOTAL ABDOMINAL HYSTERECTOMY W/ BILATERAL SALPINGOOPHORECTOMY     At age 41 due to miscarriage and retained products of conception causing significant uterine bleeding. Patient has been on estrogen replacement therapy since then.    SOCIAL HISTORY: Social History   Socioeconomic History  . Marital status: Married    Spouse name: Not on file  . Number of children: Not on file  . Years of education: Not on file  . Highest education level: Not on file   Occupational History  . Not on file  Social Needs  . Financial resource strain: Not on file  . Food insecurity:    Worry: Not on file    Inability: Not on file  . Transportation needs:    Medical: Not on file    Non-medical: Not on file  Tobacco Use  . Smoking status: Former Smoker    Last attempt to quit: 11/01/1983    Years since quitting: 33.6  . Smokeless tobacco: Never Used  Substance and Sexual Activity  . Alcohol use: Yes    Comment: occ  . Drug use: No  . Sexual activity: Never  Lifestyle  . Physical activity:    Days per week: Not on file    Minutes per session: Not on file  . Stress: Not on file  Relationships  . Social connections:    Talks on phone: Not on file    Gets together: Not on file    Attends religious service: Not on file    Active member of club or organization: Not on file    Attends meetings of clubs or organizations: Not on file    Relationship status: Not on file  . Intimate partner violence:    Fear of current or ex partner: Not on file    Emotionally abused: Not on file    Physically abused: Not on file    Forced sexual activity: Not on file  Other Topics Concern  . Not on file  Social History Narrative  . Not on file  Retired as Conservation officer, nature for [employer]. She is very well spoken and intelligent individual and exhibits a keen knowledge of her medical conditions.  FAMILY HISTORY: Family History  Problem Relation Age of Onset  . Lung cancer Sister   . Prostate cancer Brother   . Uterine cancer Maternal Aunt   . Breast cancer Paternal Grandmother   . Hodgkin's lymphoma Sister     ALLERGIES:  is allergic to nitrofurantoin; indomethacin; and lisinopril.  MEDICATIONS:  Current Outpatient Medications  Medication Sig Dispense Refill  . acetaminophen (TYLENOL) 650 MG CR tablet Take 1,300 mg by mouth 2 (two) times daily.    Marland Kitchen amLODipine (NORVASC) 5 MG tablet Take 5 mg by mouth daily.    . benzonatate (TESSALON) 100  MG capsule Take 1 capsule (100 mg total) by mouth 3 (three) times daily as needed for cough. 90 capsule 2  . budesonide (ENTOCORT EC) 3 MG 24 hr capsule Take 1 capsule (3 mg total) by mouth daily. Take once daily for 7-10 days then every other day for 10 days then stop if no GI bleeding. 30 capsule 1  . COLCRYS 0.6 MG tablet Take 0.6 mg by mouth once daily as needed for gout  1  . estradiol (ESTRACE) 0.5 MG tablet Take 0.5 mg by mouth daily.  1  . guaiFENesin (MUCINEX) 600 MG 12 hr  tablet Take 600 mg by mouth 2 (two) times daily.     . mirtazapine (REMERON) 15 MG tablet TAKE 1 TABLET(15 MG) BY MOUTH AT BEDTIME 90 tablet 0  . PRESCRIPTION MEDICATION Chemo card    . Respiratory Therapy Supplies (FLUTTER) DEVI 1 each by Does not apply route daily. 1 each 0  . senna-docusate (SENNA S) 8.6-50 MG per tablet Take 2 tablets by mouth at bedtime. (Patient taking differently: Take 1-2 tablets by mouth daily as needed for mild constipation. ) 60 tablet 1  . triamcinolone ointment (KENALOG) 0.5 % Apply 1 application topically 2 (two) times daily. 30 g 0  . vitamin B-12 (CYANOCOBALAMIN) 100 MCG tablet Take 100 mcg by mouth daily.    . iron polysaccharides (NIFEREX) 150 MG capsule Take 1 capsule (150 mg total) by mouth daily. 30 capsule 1   No current facility-administered medications for this visit.    Facility-Administered Medications Ordered in Other Visits  Medication Dose Route Frequency Provider Last Rate Last Dose  . sodium chloride 0.9 % injection 10 mL  10 mL Intravenous PRN [name], MD   10 mL at 02/28/17 1602   REVIEW OF SYSTEMS:    10 Point review of Systems was done is negative except as noted above.   PHYSICAL EXAMINATION: ECOG PERFORMANCE STATUS: 1  Vitals:   07/04/17 0953  BP: (!) 132/58  Pulse: 85  Resp: 20  Temp: 98.6 F (37 C)  SpO2: 97%   Filed Weights   07/04/17 0953  Weight: 123 lb 12.8 oz (56.2 kg)     GENERAL:alert, in no acute distress and  comfortable SKIN: no acute rashes, no significant lesions EYES: conjunctiva are pink and non-injected, sclera anicteric OROPHARYNX: MMM, no exudates, no oropharyngeal erythema or ulceration NECK: supple, no JVD LYMPH:  no palpable lymphadenopathy in the cervical, axillary or inguinal regions LUNGS: clear to auscultation b/l with normal respiratory effort HEART: regular rate & rhythm ABDOMEN:  normoactive bowel sounds , non tender, not distended. Extremity: no pedal edema PSYCH: alert & oriented x 3 with fluent speech NEURO: no focal motor/sensory deficits     LABORATORY DATA:   CBC Latest Ref Rng & Units 07/04/2017 06/13/2017 05/23/2017  WBC 3.9 - 10.3 K/uL 6.5 5.3 4.8  Hemoglobin 11.6 - 15.9 g/dL - - -  Hematocrit 34.8 - 46.6 % 29.0(L) 29.7(L) 30.1(L)  Platelets 145 - 400 K/uL 263 262 271  HGB 8.7 ANC 3.4k   CMP Latest Ref Rng & Units 07/04/2017 06/13/2017 05/23/2017  Glucose 70 - 140 mg/dL 105 139 158(H)  BUN 7 - 26 mg/dL 20 18 19   Creatinine 0.60 - 1.10 mg/dL 1.05 1.00 1.18(H)  Sodium 136 - 145 mmol/L 137 138 137  Potassium 3.5 - 5.1 mmol/L 4.2 4.2 4.1  Chloride 98 - 109 mmol/L 105 105 102  CO2 22 - 29 mmol/L 25 24 25   Calcium 8.4 - 10.4 mg/dL 9.3 9.5 9.5  Total Protein 6.4 - 8.3 g/dL 7.3 7.2 7.6  Total Bilirubin 0.2 - 1.2 mg/dL 0.4 0.3 0.3  Alkaline Phos 40 - 150 U/L 40 38(L) 45  AST 5 - 34 U/L 15 14 17   ALT 0 - 55 U/L 6 7 7    . Lab Results  Component Value Date   LDH 228 05/11/2016    Radiology .No results found.  ASSESSMENT & PLAN:   84 year old Caucasian female with  #1 Right Renal pelvis metastatic transitional cell carcinoma.   Initial PET scan revealed metastases to the  lungs, liver, multiple nodal stations and bones. -PET scan done after 8 cycles of treatment for restaging disease showed significant response to treatment with no evidence of active disease. Patient has no clinical evidence of disease progression at this time. CT chest 06/16/2015 showed no  evidence of cancer progression. PET/CT scan done on 10/21/2015- showed no findings for metastatic disease. New right-sided hydronephrosis versus local tumor recurrence needs to be evaluated further. MRI of the kidney showed hyperenhancing the right renal pelvis lesion consistent with concern for local recurrence. -PET/CT 03/06/2016 with no findings of metastatic disease. Extensive right-sided hydronephrosis with high activity from excreted FDG obscuring any findings along the collecting system. -PET/CT  06/13/2016- Interval increase in size of right kidney. Findings are suspicious for progression of residual/recurrent urothelial carcinoma with further dilatation of the right renal collecting system. 2. No evidence for hypermetabolic metastases.  -PET/CT 10/22/2016 - Continued interval progression of the abnormal soft tissue associated with the right kidney. This soft tissue remains markedly hypermetabolic and is compatible with continued progression of recurrent right renal disease. 2. No evidence for hypermetabolic metastases in the neck, chest, abdomen, or pelvis.  PET/CT 04/04/2017-  Interval enlargement of and increase in hypermetabolism within the right renal mass, consistent with residual/recurrent disease. Increase in disease progression along the proximal right ureter. 2. No evidence of abdominopelvic nodal or extra abdominal metastatic disease.   #2 Bone metastases  Plan -labs reviewed with the patient. --No overt toxicities from her maintenance treatment. -No lab or clinical evidence of disease progression at this time.  -Has been previously seen by radiation oncology with consideration for local radiation therapy if she develops symptomatic disease - her local progression in the rt renal pelvis is asymptomatic at this time. No other evidence of progressive metastatic disease. -She is still mostly asymptomatic and responding well to treatment.  -continue Atezolizumab q3 weeks as per  orders. -continue Xgeva to q12 weeks since she has completed 2 yrs of this treatment already. -transfuse prn for hgb <8 or if symptomatic.  #2 h/o skin rash likely related to her Atezolizumab. Minimal grade 1 and controlled with topical triamcinolone. Unchanged today.  #3 chronic bronchiectasis with Cough and some clear secretions likely due to bronchiectasis. Followed by Dr Melvyn Novas. -No chest pain no increased shortness of breath or dyspnea on exertion  -Has an action plan with Dr. Melvyn Novas to use when necessary antibiotics for worsening symptoms. -Continue follow-up with Dr. 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 Wynetta Emery.  But stopped using it due to cost issues. Was then on PO budesonide which she has stopped as well. last sigmoidscopy with Dr Wynetta Emery - showed stable ulcerative proctitis  -transfuse prn for hgb<8. Does not report overt rectal bleeding at this time. -Discussed pt labwork today; Hgb is continuing to slowly trend down and is now at 8.7.  -We will refill her Budesonide and advised the pt to take this for 7 days and then prn.  -Given that her Hgb has been steadily dropping, her blood loss in the stools, and her decreased energy levels, we recommended PO vs IV iron. -Asked pt to let us know if she begins to feel weaker, or more light headed, and we will set her up for a blood transfusion. -The pt prefers to try PO iron at this time and we will evaluate the efficacy of  this with iron labs and will consider IV iron in the future if necessary.  -We will order Iron Polysaccharide 150mg    #5 history of hypertension   -on Amlodipine   F/u with treatment and labs per next 2 appointments and treatment   All of the patients questions were answered with apparent satisfaction. The patient knows to call the clinic with  any problems, questions or concerns.   The total time spent in the appointment was 25 minutes and more than 50% was on counseling and direct patient cares.   Tiffany Lone MD MS Hematology/Oncology Physician [facility]  (Office):       [phone] (Work cell):  [phone] (Fax):           [phone]  This document serves as a record of services personally performed by Tiffany Lone, MD. It was created on his behalf by Baldwin Jamaica, a trained medical scribe. The creation of this record is based on the scribe's personal observations and the provider's statements to them.   .I have reviewed the above documentation for accuracy and completeness, and I agree with the above. [name] MD MS

## 2017-07-04 ENCOUNTER — Inpatient Hospital Stay: Payer: Medicare Other | Attending: Hematology

## 2017-07-04 ENCOUNTER — Inpatient Hospital Stay: Payer: Medicare Other

## 2017-07-04 ENCOUNTER — Inpatient Hospital Stay: Payer: Medicare Other | Admitting: Hematology

## 2017-07-04 ENCOUNTER — Encounter: Payer: Self-pay | Admitting: Hematology

## 2017-07-04 VITALS — BP 132/58 | HR 85 | Temp 98.6°F | Resp 20 | Ht 61.0 in | Wt 123.8 lb

## 2017-07-04 DIAGNOSIS — C7951 Secondary malignant neoplasm of bone: Secondary | ICD-10-CM

## 2017-07-04 DIAGNOSIS — K922 Gastrointestinal hemorrhage, unspecified: Secondary | ICD-10-CM

## 2017-07-04 DIAGNOSIS — C787 Secondary malignant neoplasm of liver and intrahepatic bile duct: Secondary | ICD-10-CM | POA: Insufficient documentation

## 2017-07-04 DIAGNOSIS — C7800 Secondary malignant neoplasm of unspecified lung: Secondary | ICD-10-CM

## 2017-07-04 DIAGNOSIS — Z5112 Encounter for antineoplastic immunotherapy: Secondary | ICD-10-CM | POA: Insufficient documentation

## 2017-07-04 DIAGNOSIS — C651 Malignant neoplasm of right renal pelvis: Secondary | ICD-10-CM | POA: Insufficient documentation

## 2017-07-04 DIAGNOSIS — C659 Malignant neoplasm of unspecified renal pelvis: Secondary | ICD-10-CM

## 2017-07-04 DIAGNOSIS — Z95828 Presence of other vascular implants and grafts: Secondary | ICD-10-CM

## 2017-07-04 LAB — COMPREHENSIVE METABOLIC PANEL
ALK PHOS: 40 U/L (ref 40–150)
AST: 15 U/L (ref 5–34)
Albumin: 2.8 g/dL — ABNORMAL LOW (ref 3.5–5.0)
Anion gap: 7 (ref 3–11)
BILIRUBIN TOTAL: 0.4 mg/dL (ref 0.2–1.2)
BUN: 20 mg/dL (ref 7–26)
CALCIUM: 9.3 mg/dL (ref 8.4–10.4)
CHLORIDE: 105 mmol/L (ref 98–109)
CO2: 25 mmol/L (ref 22–29)
CREATININE: 1.05 mg/dL (ref 0.60–1.10)
GFR, EST AFRICAN AMERICAN: 52 mL/min — AB (ref >60)
GFR, EST NON AFRICAN AMERICAN: 45 mL/min — AB (ref >60)
Glucose, Bld: 105 mg/dL (ref 70–140)
Potassium: 4.2 mmol/L (ref 3.5–5.1)
Sodium: 137 mmol/L (ref 136–145)
Total Protein: 7.3 g/dL (ref 6.4–8.3)

## 2017-07-04 LAB — CBC WITH DIFFERENTIAL (CANCER CENTER ONLY)
BASOS ABS: 0 10*3/uL (ref 0.0–0.1)
BASOS PCT: 0 %
Eosinophils Absolute: 0.2 10*3/uL (ref 0.0–0.5)
Eosinophils Relative: 2 %
HEMATOCRIT: 29 % — AB (ref 34.8–46.6)
HEMOGLOBIN: 8.7 g/dL — AB (ref 11.6–15.9)
Lymphocytes Relative: 18 %
Lymphs Abs: 1.2 10*3/uL (ref 0.9–3.3)
MCH: 26 pg (ref 25.1–34.0)
MCHC: 30 g/dL — ABNORMAL LOW (ref 31.5–36.0)
MCV: 86.8 fL (ref 79.5–101.0)
Monocytes Absolute: 1.8 10*3/uL — ABNORMAL HIGH (ref 0.1–0.9)
Monocytes Relative: 27 %
NEUTROS ABS: 3.4 10*3/uL (ref 1.5–6.5)
Neutrophils Relative %: 53 %
Platelet Count: 263 10*3/uL (ref 145–400)
RBC: 3.34 MIL/uL — AB (ref 3.70–5.45)
RDW: 17.5 % — AB (ref 11.2–14.5)
WBC: 6.5 10*3/uL (ref 3.9–10.3)

## 2017-07-04 LAB — RETICULOCYTES
RBC.: 3.34 MIL/uL — AB (ref 3.70–5.45)
RETIC COUNT ABSOLUTE: 60.1 10*3/uL (ref 33.7–90.7)
Retic Ct Pct: 1.8 % (ref 0.7–2.1)

## 2017-07-04 MED ORDER — POLYSACCHARIDE IRON COMPLEX 150 MG PO CAPS: 150.0000 mg | ORAL_CAPSULE | Freq: Every day | ORAL | 1 refills | Status: AC

## 2017-07-04 MED ORDER — SODIUM CHLORIDE 0.9 % IJ SOLN
10.0000 mL | INTRAMUSCULAR | Status: DC | PRN
  Administered 2017-07-04: 10 mL via INTRAVENOUS
  Filled 2017-07-04: qty 10

## 2017-07-04 MED ORDER — ATEZOLIZUMAB CHEMO INJECTION 1200 MG/20ML
1200.0000 mg | Freq: Once | INTRAVENOUS | Status: AC
  Administered 2017-07-04: 1200 mg via INTRAVENOUS
  Filled 2017-07-04: qty 20

## 2017-07-04 MED ORDER — SODIUM CHLORIDE 0.9 % IJ SOLN
10.0000 mL | INTRAMUSCULAR | Status: DC | PRN
  Administered 2017-07-04: 10 mL
  Filled 2017-07-04: qty 10

## 2017-07-04 MED ORDER — SODIUM CHLORIDE 0.9 % IV SOLN
Freq: Once | INTRAVENOUS | Status: AC
  Administered 2017-07-04: 12:00:00 via INTRAVENOUS

## 2017-07-04 MED ORDER — BUDESONIDE 3 MG PO CPEP: 3.0000 mg | ORAL_CAPSULE | Freq: Every day | ORAL | 1 refills | Status: DC

## 2017-07-04 MED ORDER — HEPARIN SOD (PORK) LOCK FLUSH 100 UNIT/ML IV SOLN
500.0000 [IU] | Freq: Once | INTRAVENOUS | Status: AC | PRN
  Administered 2017-07-04: 500 [IU]
  Filled 2017-07-04: qty 5

## 2017-07-04 NOTE — Patient Instructions (Signed)
[facility] Discharge Instructions for Patients Receiving Chemotherapy  Today you received the following chemotherapy agents tecentriq  To help prevent nausea and vomiting after your treatment, we encourage you to take your nausea medication as directed  If you develop nausea and vomiting that is not controlled by your nausea medication, call the clinic.   BELOW ARE SYMPTOMS THAT SHOULD BE REPORTED IMMEDIATELY:  *FEVER GREATER THAN 100.5 F  *CHILLS WITH OR WITHOUT FEVER  NAUSEA AND VOMITING THAT IS NOT CONTROLLED WITH YOUR NAUSEA MEDICATION  *UNUSUAL SHORTNESS OF BREATH  *UNUSUAL BRUISING OR BLEEDING  TENDERNESS IN MOUTH AND THROAT WITH OR WITHOUT PRESENCE OF ULCERS  *URINARY PROBLEMS  *BOWEL PROBLEMS  UNUSUAL RASH Items with * indicate a potential emergency and should be followed up as soon as possible.  Feel free to call the clinic you have any questions or concerns. The clinic phone number is [phone].  

## 2017-07-16 ENCOUNTER — Encounter (HOSPITAL_COMMUNITY): Payer: Self-pay

## 2017-07-16 ENCOUNTER — Emergency Department (HOSPITAL_COMMUNITY)
Admission: EM | Admit: 2017-07-16 | Discharge: 2017-07-16 | Disposition: A | Discharge status: Home / Self Care | Payer: Medicare Other | Source: Home / Self Care | Attending: Emergency Medicine | Admitting: Emergency Medicine

## 2017-07-16 ENCOUNTER — Other Ambulatory Visit: Payer: Self-pay

## 2017-07-16 DIAGNOSIS — C659 Malignant neoplasm of unspecified renal pelvis: Secondary | ICD-10-CM | POA: Diagnosis not present

## 2017-07-16 DIAGNOSIS — I472 Ventricular tachycardia: Secondary | ICD-10-CM | POA: Diagnosis not present

## 2017-07-16 DIAGNOSIS — B370 Candidal stomatitis: Secondary | ICD-10-CM | POA: Diagnosis not present

## 2017-07-16 DIAGNOSIS — Z79899 Other long term (current) drug therapy: Secondary | ICD-10-CM

## 2017-07-16 DIAGNOSIS — Z8551 Personal history of malignant neoplasm of bladder: Secondary | ICD-10-CM

## 2017-07-16 DIAGNOSIS — I10 Essential (primary) hypertension: Secondary | ICD-10-CM

## 2017-07-16 DIAGNOSIS — N183 Chronic kidney disease, stage 3 (moderate): Secondary | ICD-10-CM | POA: Diagnosis not present

## 2017-07-16 DIAGNOSIS — K529 Noninfective gastroenteritis and colitis, unspecified: Secondary | ICD-10-CM

## 2017-07-16 DIAGNOSIS — M109 Gout, unspecified: Secondary | ICD-10-CM | POA: Diagnosis present

## 2017-07-16 DIAGNOSIS — E43 Unspecified severe protein-calorie malnutrition: Secondary | ICD-10-CM | POA: Diagnosis not present

## 2017-07-16 DIAGNOSIS — C787 Secondary malignant neoplasm of liver and intrahepatic bile duct: Secondary | ICD-10-CM | POA: Diagnosis not present

## 2017-07-16 DIAGNOSIS — Z87891 Personal history of nicotine dependence: Secondary | ICD-10-CM | POA: Insufficient documentation

## 2017-07-16 DIAGNOSIS — C689 Malignant neoplasm of urinary organ, unspecified: Secondary | ICD-10-CM | POA: Diagnosis not present

## 2017-07-16 DIAGNOSIS — R030 Elevated blood-pressure reading, without diagnosis of hypertension: Secondary | ICD-10-CM | POA: Diagnosis not present

## 2017-07-16 DIAGNOSIS — D72829 Elevated white blood cell count, unspecified: Secondary | ICD-10-CM | POA: Diagnosis not present

## 2017-07-16 DIAGNOSIS — I129 Hypertensive chronic kidney disease with stage 1 through stage 4 chronic kidney disease, or unspecified chronic kidney disease: Secondary | ICD-10-CM | POA: Diagnosis not present

## 2017-07-16 DIAGNOSIS — Z90722 Acquired absence of ovaries, bilateral: Secondary | ICD-10-CM | POA: Diagnosis not present

## 2017-07-16 DIAGNOSIS — D62 Acute posthemorrhagic anemia: Secondary | ICD-10-CM | POA: Diagnosis not present

## 2017-07-16 DIAGNOSIS — A09 Infectious gastroenteritis and colitis, unspecified: Secondary | ICD-10-CM | POA: Diagnosis not present

## 2017-07-16 DIAGNOSIS — D638 Anemia in other chronic diseases classified elsewhere: Secondary | ICD-10-CM | POA: Diagnosis not present

## 2017-07-16 DIAGNOSIS — A021 Salmonella sepsis: Secondary | ICD-10-CM | POA: Diagnosis not present

## 2017-07-16 DIAGNOSIS — Z90710 Acquired absence of both cervix and uterus: Secondary | ICD-10-CM | POA: Diagnosis not present

## 2017-07-16 DIAGNOSIS — E876 Hypokalemia: Secondary | ICD-10-CM | POA: Diagnosis not present

## 2017-07-16 DIAGNOSIS — R509 Fever, unspecified: Secondary | ICD-10-CM | POA: Diagnosis not present

## 2017-07-16 DIAGNOSIS — C649 Malignant neoplasm of unspecified kidney, except renal pelvis: Secondary | ICD-10-CM | POA: Diagnosis not present

## 2017-07-16 DIAGNOSIS — Z9049 Acquired absence of other specified parts of digestive tract: Secondary | ICD-10-CM | POA: Diagnosis not present

## 2017-07-16 DIAGNOSIS — E872 Acidosis: Secondary | ICD-10-CM | POA: Diagnosis not present

## 2017-07-16 DIAGNOSIS — K922 Gastrointestinal hemorrhage, unspecified: Secondary | ICD-10-CM | POA: Diagnosis not present

## 2017-07-16 DIAGNOSIS — N179 Acute kidney failure, unspecified: Secondary | ICD-10-CM | POA: Diagnosis not present

## 2017-07-16 DIAGNOSIS — Z8711 Personal history of peptic ulcer disease: Secondary | ICD-10-CM | POA: Diagnosis not present

## 2017-07-16 DIAGNOSIS — R031 Nonspecific low blood-pressure reading: Secondary | ICD-10-CM | POA: Diagnosis not present

## 2017-07-16 DIAGNOSIS — C799 Secondary malignant neoplasm of unspecified site: Secondary | ICD-10-CM | POA: Diagnosis not present

## 2017-07-16 DIAGNOSIS — R531 Weakness: Secondary | ICD-10-CM | POA: Diagnosis not present

## 2017-07-16 DIAGNOSIS — R197 Diarrhea, unspecified: Secondary | ICD-10-CM | POA: Diagnosis not present

## 2017-07-16 DIAGNOSIS — R7881 Bacteremia: Secondary | ICD-10-CM | POA: Diagnosis not present

## 2017-07-16 DIAGNOSIS — K219 Gastro-esophageal reflux disease without esophagitis: Secondary | ICD-10-CM | POA: Diagnosis present

## 2017-07-16 DIAGNOSIS — D899 Disorder involving the immune mechanism, unspecified: Secondary | ICD-10-CM | POA: Diagnosis not present

## 2017-07-16 DIAGNOSIS — Z888 Allergy status to other drugs, medicaments and biological substances status: Secondary | ICD-10-CM | POA: Diagnosis not present

## 2017-07-16 DIAGNOSIS — M170 Bilateral primary osteoarthritis of knee: Secondary | ICD-10-CM | POA: Diagnosis present

## 2017-07-16 DIAGNOSIS — K5120 Ulcerative (chronic) proctitis without complications: Secondary | ICD-10-CM | POA: Diagnosis not present

## 2017-07-16 DIAGNOSIS — K51919 Ulcerative colitis, unspecified with unspecified complications: Secondary | ICD-10-CM | POA: Diagnosis not present

## 2017-07-16 DIAGNOSIS — A020 Salmonella enteritis: Secondary | ICD-10-CM | POA: Diagnosis not present

## 2017-07-16 DIAGNOSIS — K625 Hemorrhage of anus and rectum: Secondary | ICD-10-CM | POA: Diagnosis not present

## 2017-07-16 DIAGNOSIS — K921 Melena: Secondary | ICD-10-CM | POA: Diagnosis not present

## 2017-07-16 LAB — CBC
HEMATOCRIT: 29.2 % — AB (ref 36.0–46.0)
HEMATOCRIT: 31.1 % — AB (ref 36.0–46.0)
Hemoglobin: 8.8 g/dL — ABNORMAL LOW (ref 12.0–15.0)
Hemoglobin: 9.4 g/dL — ABNORMAL LOW (ref 12.0–15.0)
MCH: 25.4 pg — ABNORMAL LOW (ref 26.0–34.0)
MCH: 25.7 pg — AB (ref 26.0–34.0)
MCHC: 30.1 g/dL (ref 30.0–36.0)
MCHC: 30.2 g/dL (ref 30.0–36.0)
MCV: 84.4 fL (ref 78.0–100.0)
MCV: 85 fL (ref 78.0–100.0)
PLATELETS: 363 10*3/uL (ref 150–400)
Platelets: 339 10*3/uL (ref 150–400)
RBC: 3.46 MIL/uL — AB (ref 3.87–5.11)
RBC: 3.66 MIL/uL — ABNORMAL LOW (ref 3.87–5.11)
RDW: 17.4 % — AB (ref 11.5–15.5)
RDW: 17.2 % — ABNORMAL HIGH (ref 11.5–15.5)
WBC: 7.6 10*3/uL (ref 4.0–10.5)
WBC: 9.4 10*3/uL (ref 4.0–10.5)

## 2017-07-16 LAB — COMPREHENSIVE METABOLIC PANEL
ALK PHOS: 39 U/L (ref 38–126)
ALT: 11 U/L — AB (ref 14–54)
AST: 17 U/L (ref 15–41)
Albumin: 3 g/dL — ABNORMAL LOW (ref 3.5–5.0)
Anion gap: 10 (ref 5–15)
BILIRUBIN TOTAL: 0.6 mg/dL (ref 0.3–1.2)
BUN: 21 mg/dL — AB (ref 6–20)
CALCIUM: 9.4 mg/dL (ref 8.9–10.3)
CHLORIDE: 103 mmol/L (ref 101–111)
CO2: 23 mmol/L (ref 22–32)
CREATININE: 1.09 mg/dL — AB (ref 0.44–1.00)
GFR calc Af Amer: 50 mL/min — ABNORMAL LOW (ref >60)
GFR, EST NON AFRICAN AMERICAN: 43 mL/min — AB (ref >60)
Glucose, Bld: 97 mg/dL (ref 65–99)
Potassium: 4.6 mmol/L (ref 3.5–5.1)
Sodium: 136 mmol/L (ref 135–145)
Total Protein: 7.5 g/dL (ref 6.5–8.1)

## 2017-07-16 LAB — TYPE AND SCREEN
ABO/RH(D): A POS
ANTIBODY SCREEN: NEGATIVE

## 2017-07-16 LAB — ABO/RH: ABO/RH(D): A POS

## 2017-07-16 NOTE — ED Notes (Signed)
Bed: IZ12 Expected date: 07/16/17 Expected time: 8:29 AM Means of arrival: Ambulance Comments: EMS-bloody stool

## 2017-07-16 NOTE — ED Triage Notes (Signed)
EMS reports from home noticed on Saturday dark bloody stools and continuing, new med began taking x 2 weeks Diclofenac. Mets Bladder Cancer Pt. Denies blood thinners. Hx of Hypertension.  BP 138/76 HR 90 Resp 16 Sp02 98 RA CBG 120

## 2017-07-16 NOTE — ED Provider Notes (Signed)
[facility] DEPT Provider Note   CSN: [id number] Arrival date & time: 07/16/17  0830     History   Chief Complaint Chief Complaint  Patient presents with  . GI Bleeding  . Blood In Stools    HPI Tiffany [name] is a 84 y.o. female.  HPI Patient is a 84 year old female who denies a history of anticoagulation use who presents the emergency department with a small amount of blood on the outside of her stool over the past several days.  She does have a history of metastatic bladder cancer under current therapy by oncology.  She is on iron therapy at this time for history of anemia with instructions by her oncology team for blood transfusions less than 8.  She has a long-standing history of chronic GI blood loss and iron deficient anemia.  No fevers or chills.  No lightheadedness or weakness.  Denies shortness of breath.  No back pain.  Denies nausea or vomiting.   Past Medical History:  Diagnosis Date  . Arthritis     knees 03-10-12 had Cortisone injection  . Cancer [facility]) june 2016   metastatic  . DDD (degenerative disc disease), cervical   . Edema leg    feet and ankles  . Esophagus disorder    Had esophagus stretched  . GERD (gastroesophageal reflux disease)    Benign stricture dilated in 2011  . Gout    Patient notes she has possible gout but has not been on chronic medications for this  . Headache(784.0)   . Hypertension   . Occipital neuralgia   . Occipital neuralgia    Related to cervical degenerative disc disease  . Peptic ulcer disease    Previous history in the remote past  . Ulcerative proctitis ([place])   . Ulcerative proctitis [facility]) 2014    Patient Active Problem List   Diagnosis Date Noted  . Port catheter in place 07/21/2015  . Bronchiolitis 07/01/2015  . Bronchiectasis ([place]) with probable Assoc MAI  06/10/2015  . Liver metastases ([place]) 05/22/2015  . Bone metastases ([place]) 05/22/2015  . Bronchopneumonia 04/29/2015  . Insomnia  04/08/2015  . 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 ABDOMINAL HYSTERECTOMY    . APPENDECTOMY    . CHOLECYSTECTOMY    . COLONOSCOPY WITH PROPOFOL  03/25/2012   Procedure: COLONOSCOPY WITH PROPOFOL;  Surgeon: Garlan Fair, MD;  Location: [facility] ENDOS[facility];  Service: Endoscopy;  Laterality: N/A;  . DILATION AND CURETTAGE OF UTERUS    . ESOPHAGEAL DILATION     For benign stricture in 2011  . ESOPHAGOGASTRODUODENOSCOPY    . FLEXIBLE SIGMOIDOSCOPY N/A 04/26/2014   Procedure: FLEXIBLE SIGMOIDOSCOPY - UnSedated;  Surgeon: Garlan Fair, MD;  [facility];  Service: Endoscopy;  Laterality: N/A;  . FLEXIBLE SIGMOIDOSCOPY N/A 01/24/2015   Procedure: FLEXIBLE SIGNMOIDOSCOPY W/ FLEET ENEMIA;  Surgeon: Garlan Fair, MD;  [facility];  Service: Endoscopy;  Laterality: N/A;  . FLEXIBLE SIGMOIDOSCOPY N/A 05/15/2016   Procedure: FLEXIBLE SIGMOIDOSCOPY;  Surgeon: Garlan Fair, MD;  Location[facility];  Service: Endoscopy;  Laterality: N/A;  pt needs to ahve an enema upon arrival   . NECK SURGERY     20 years ago  . TOTAL ABDOMINAL HYSTERECTOMY W/ BILATERAL SALPINGOOPHORECTOMY     At age 89 due to miscarriage and retained products of conception causing significant uterine bleeding. Patient has been on estrogen replacement therapy  since then.     OB History   None      Home Medications    Prior to Admission medications   Medication Sig Start Date End Date Taking? Authorizing Provider  acetaminophen (TYLENOL) 650 MG CR tablet Take 1,300 mg by mouth 2 (two) times daily.   Yes [provider]  amLODipine (NORVASC) 5 MG tablet Take 5 mg by mouth daily.   Yes [provider]  benzonatate (TESSALON) 100 MG capsule Take 1 capsule (100 mg total) by mouth 3 (three) times daily as needed for cough. 05/03/17  Yes [name], MD  budesonide (ENTOCORT EC) 3 MG 24 hr capsule  Take 1 capsule (3 mg total) by mouth daily. Take once daily for 7-10 days then every other day for 10 days then stop if no GI bleeding. 07/04/17  Yes [name], MD  diclofenac sodium (VOLTAREN) 1 % GEL APPLY 2 TO 4 GRAMS Q 6 H PRN FOR PAIN 06/20/17  Yes [provider]  estradiol (ESTRACE) 0.5 MG tablet Take 0.5 mg by mouth daily. 08/02/14  Yes [provider]  iron polysaccharides (NIFEREX) 150 MG capsule Take 1 capsule (150 mg total) by mouth daily. 07/04/17  Yes [name], MD  mirtazapine (REMERON) 15 MG tablet TAKE 1 TABLET(15 MG) BY MOUTH AT BEDTIME 06/20/17  Yes [name], MD  PRESCRIPTION MEDICATION Chemo card   Yes [provider]  senna-docusate (SENNA S) 8.6-50 MG per tablet Take 2 tablets by mouth at bedtime. Patient taking differently: Take 1-2 tablets by mouth daily as needed for mild constipation.  11/30/14  Yes [name], MD  triamcinolone ointment (KENALOG) 0.5 % Apply 1 application topically 2 (two) times daily. 05/03/17  Yes [name], MD  vitamin B-12 (CYANOCOBALAMIN) 100 MCG tablet Take 100 mcg by mouth daily.   Yes [provider]  Respiratory Therapy Supplies (FLUTTER) DEVI 1 each by Does not apply route daily. 07/06/15   Tanda Rockers, MD    Family History Family History  Problem Relation Age of Onset  . Lung cancer Sister   . Prostate cancer Brother   . Uterine cancer Maternal Aunt   . Breast cancer Paternal Grandmother   . Hodgkin's lymphoma Sister     Social History Social History   Tobacco Use  . Smoking status: Former Smoker    Last attempt to quit: 11/01/1983    Years since quitting: 33.7  . Smokeless tobacco: Never Used  Substance Use Topics  . Alcohol use: Yes    Comment: occ  . Drug use: No     Allergies   Nitrofurantoin; Indomethacin; and Lisinopril   Review of Systems Review of Systems  All other systems reviewed and are negative.    Physical Exam Updated  Vital Signs BP 139/64   Pulse 91   Temp 98.7 F (37.1 C) (Oral)   Resp 16   Ht 5\' 1"  (1.549 m)   Wt 55.8 kg (123 lb)   SpO2 94%   BMI 23.24 kg/m   Physical Exam  Constitutional: She is oriented to person, place, and time. She appears well-developed and well-nourished. No distress.  HENT:  Head: Normocephalic and atraumatic.  Eyes: EOM are normal.  Neck: Normal range of motion.  Cardiovascular: Normal rate, regular rhythm and normal heart sounds.  Pulmonary/Chest: Effort normal and breath sounds normal.  Abdominal: Soft. She exhibits no distension. There is no tenderness.  Genitourinary:  Genitourinary Comments: Normal brown stool  on examination.  No gross blood.  Small nonbleeding external hemorrhoid.  Neurological: She is alert and oriented to person, place, and time.  Skin: Skin is warm and dry.  Psychiatric: She has a normal mood and affect. Judgment normal.  Nursing note and vitals reviewed.    ED Treatments / Results  Labs (all labs ordered are listed, but only abnormal results are displayed) Labs Reviewed  CBC - Abnormal; Notable for the following components:      Result Value   RBC 3.46 (*)    Hemoglobin 8.8 (*)    HCT 29.2 (*)    MCH 25.4 (*)    RDW 17.4 (*)    All other components within normal limits  COMPREHENSIVE METABOLIC PANEL - Abnormal; Notable for the following components:   BUN 21 (*)    Creatinine, Ser 1.09 (*)    Albumin 3.0 (*)    ALT 11 (*)    GFR calc non Af Amer 43 (*)    GFR calc Af Amer 50 (*)    All other components within normal limits  CBC - Abnormal; Notable for the following components:   RBC 3.66 (*)    Hemoglobin 9.4 (*)    HCT 31.1 (*)    MCH 25.7 (*)    RDW 17.2 (*)    All other components within normal limits  TYPE AND SCREEN  ABO/RH   Hemoglobin  Date Value Ref Range Status  07/16/2017 9.4 (L) 12.0 - 15.0 g/dL Final  07/16/2017 8.8 (L) 12.0 - 15.0 g/dL Final   HGB  Date Value Ref Range Status  03/21/2017 9.0 (L)  11.6 - 15.9 g/dL Final  02/28/2017 9.7 (L) 11.6 - 15.9 g/dL Final  02/07/2017 9.8 (L) 11.6 - 15.9 g/dL Final  01/17/2017 9.6 (L) 11.6 - 15.9 g/dL Final     EKG None  Radiology No results found.  Procedures Procedures (including critical care time)  Medications Ordered in ED Medications - No data to display   Initial Impression / Assessment and Plan / ED Course  I have reviewed the triage vital signs and the nursing notes.  Pertinent labs & imaging results that were available during my care of the patient were reviewed by me and considered in my medical decision making (see chart for details).     Hemoglobin is baseline for patient.  I personally reviewed the patient's prior hemoglobins.  I have reviewed her prior oncology notes as well.  At this time I suspect she has a mild colitis with some mild bleeding.  There is no bleeding of her external hemorrhoid.  Her abdominal exam is benign.  Her vital signs are stable.  Hemoglobin x2 checked here in the emergency department is without a reduction in value.  Discharged home in good condition.  Outpatient GI follow-up.  Patient and family understand to return to the emergency department for any new or worsening symptoms.  I do not think she needs additional testing or acute hospitalization at this time.  Suspect mild hemorrhagic acute colitis.  Final Clinical Impressions(s) / ED Diagnoses   Final diagnoses:  Acute colitis    ED Discharge Orders    None       Jola Schmidt, MD 07/16/17 [phone]

## 2017-07-17 ENCOUNTER — Other Ambulatory Visit: Payer: Self-pay

## 2017-07-17 ENCOUNTER — Encounter (HOSPITAL_COMMUNITY): Payer: Self-pay | Admitting: *Deleted

## 2017-07-17 ENCOUNTER — Inpatient Hospital Stay (HOSPITAL_COMMUNITY)
Admission: EM | Admit: 2017-07-17 | Discharge: 2017-07-26 | DRG: 871 | Disposition: A | Discharge status: Home Health | Payer: Medicare Other | Attending: Internal Medicine | Admitting: Internal Medicine

## 2017-07-17 DIAGNOSIS — I129 Hypertensive chronic kidney disease with stage 1 through stage 4 chronic kidney disease, or unspecified chronic kidney disease: Secondary | ICD-10-CM | POA: Diagnosis present

## 2017-07-17 DIAGNOSIS — R7881 Bacteremia: Secondary | ICD-10-CM | POA: Diagnosis not present

## 2017-07-17 DIAGNOSIS — R531 Weakness: Secondary | ICD-10-CM

## 2017-07-17 DIAGNOSIS — Z79890 Hormone replacement therapy (postmenopausal): Secondary | ICD-10-CM

## 2017-07-17 DIAGNOSIS — Z79899 Other long term (current) drug therapy: Secondary | ICD-10-CM

## 2017-07-17 DIAGNOSIS — R5381 Other malaise: Secondary | ICD-10-CM | POA: Diagnosis present

## 2017-07-17 DIAGNOSIS — E43 Unspecified severe protein-calorie malnutrition: Secondary | ICD-10-CM | POA: Diagnosis present

## 2017-07-17 DIAGNOSIS — Z90722 Acquired absence of ovaries, bilateral: Secondary | ICD-10-CM

## 2017-07-17 DIAGNOSIS — K5120 Ulcerative (chronic) proctitis without complications: Secondary | ICD-10-CM | POA: Diagnosis not present

## 2017-07-17 DIAGNOSIS — I10 Essential (primary) hypertension: Secondary | ICD-10-CM | POA: Diagnosis not present

## 2017-07-17 DIAGNOSIS — Z7952 Long term (current) use of systemic steroids: Secondary | ICD-10-CM

## 2017-07-17 DIAGNOSIS — Z8711 Personal history of peptic ulcer disease: Secondary | ICD-10-CM | POA: Diagnosis not present

## 2017-07-17 DIAGNOSIS — Z807 Family history of other malignant neoplasms of lymphoid, hematopoietic and related tissues: Secondary | ICD-10-CM

## 2017-07-17 DIAGNOSIS — Z90710 Acquired absence of both cervix and uterus: Secondary | ICD-10-CM

## 2017-07-17 DIAGNOSIS — K529 Noninfective gastroenteritis and colitis, unspecified: Secondary | ICD-10-CM | POA: Diagnosis present

## 2017-07-17 DIAGNOSIS — R031 Nonspecific low blood-pressure reading: Secondary | ICD-10-CM | POA: Diagnosis not present

## 2017-07-17 DIAGNOSIS — Z9049 Acquired absence of other specified parts of digestive tract: Secondary | ICD-10-CM

## 2017-07-17 DIAGNOSIS — Z8042 Family history of malignant neoplasm of prostate: Secondary | ICD-10-CM

## 2017-07-17 DIAGNOSIS — I472 Ventricular tachycardia: Secondary | ICD-10-CM | POA: Diagnosis present

## 2017-07-17 DIAGNOSIS — R509 Fever, unspecified: Secondary | ICD-10-CM

## 2017-07-17 DIAGNOSIS — Z888 Allergy status to other drugs, medicaments and biological substances status: Secondary | ICD-10-CM

## 2017-07-17 DIAGNOSIS — K922 Gastrointestinal hemorrhage, unspecified: Secondary | ICD-10-CM | POA: Diagnosis not present

## 2017-07-17 DIAGNOSIS — A020 Salmonella enteritis: Secondary | ICD-10-CM | POA: Diagnosis present

## 2017-07-17 DIAGNOSIS — N183 Chronic kidney disease, stage 3 (moderate): Secondary | ICD-10-CM | POA: Diagnosis present

## 2017-07-17 DIAGNOSIS — Z87891 Personal history of nicotine dependence: Secondary | ICD-10-CM

## 2017-07-17 DIAGNOSIS — R197 Diarrhea, unspecified: Secondary | ICD-10-CM | POA: Diagnosis present

## 2017-07-17 DIAGNOSIS — D62 Acute posthemorrhagic anemia: Secondary | ICD-10-CM | POA: Diagnosis present

## 2017-07-17 DIAGNOSIS — M109 Gout, unspecified: Secondary | ICD-10-CM | POA: Diagnosis present

## 2017-07-17 DIAGNOSIS — I951 Orthostatic hypotension: Secondary | ICD-10-CM | POA: Diagnosis present

## 2017-07-17 DIAGNOSIS — E872 Acidosis: Secondary | ICD-10-CM | POA: Diagnosis present

## 2017-07-17 DIAGNOSIS — C659 Malignant neoplasm of unspecified renal pelvis: Secondary | ICD-10-CM | POA: Diagnosis present

## 2017-07-17 DIAGNOSIS — K648 Other hemorrhoids: Secondary | ICD-10-CM | POA: Diagnosis present

## 2017-07-17 DIAGNOSIS — Z8049 Family history of malignant neoplasm of other genital organs: Secondary | ICD-10-CM

## 2017-07-17 DIAGNOSIS — B370 Candidal stomatitis: Secondary | ICD-10-CM | POA: Diagnosis present

## 2017-07-17 DIAGNOSIS — D638 Anemia in other chronic diseases classified elsewhere: Secondary | ICD-10-CM | POA: Diagnosis present

## 2017-07-17 DIAGNOSIS — K219 Gastro-esophageal reflux disease without esophagitis: Secondary | ICD-10-CM | POA: Diagnosis present

## 2017-07-17 DIAGNOSIS — K51919 Ulcerative colitis, unspecified with unspecified complications: Secondary | ICD-10-CM | POA: Diagnosis not present

## 2017-07-17 DIAGNOSIS — C799 Secondary malignant neoplasm of unspecified site: Secondary | ICD-10-CM | POA: Diagnosis not present

## 2017-07-17 DIAGNOSIS — C787 Secondary malignant neoplasm of liver and intrahepatic bile duct: Secondary | ICD-10-CM | POA: Diagnosis present

## 2017-07-17 DIAGNOSIS — Z801 Family history of malignant neoplasm of trachea, bronchus and lung: Secondary | ICD-10-CM

## 2017-07-17 DIAGNOSIS — E860 Dehydration: Secondary | ICD-10-CM | POA: Diagnosis present

## 2017-07-17 DIAGNOSIS — K625 Hemorrhage of anus and rectum: Secondary | ICD-10-CM | POA: Diagnosis not present

## 2017-07-17 DIAGNOSIS — N179 Acute kidney failure, unspecified: Secondary | ICD-10-CM | POA: Diagnosis present

## 2017-07-17 DIAGNOSIS — C689 Malignant neoplasm of urinary organ, unspecified: Secondary | ICD-10-CM | POA: Diagnosis not present

## 2017-07-17 DIAGNOSIS — E876 Hypokalemia: Secondary | ICD-10-CM | POA: Diagnosis present

## 2017-07-17 DIAGNOSIS — Z803 Family history of malignant neoplasm of breast: Secondary | ICD-10-CM

## 2017-07-17 DIAGNOSIS — C649 Malignant neoplasm of unspecified kidney, except renal pelvis: Secondary | ICD-10-CM | POA: Diagnosis not present

## 2017-07-17 DIAGNOSIS — D72829 Elevated white blood cell count, unspecified: Secondary | ICD-10-CM | POA: Diagnosis not present

## 2017-07-17 DIAGNOSIS — Z6826 Body mass index (BMI) 26.0-26.9, adult: Secondary | ICD-10-CM

## 2017-07-17 DIAGNOSIS — A021 Salmonella sepsis: Secondary | ICD-10-CM | POA: Diagnosis not present

## 2017-07-17 DIAGNOSIS — M170 Bilateral primary osteoarthritis of knee: Secondary | ICD-10-CM | POA: Diagnosis present

## 2017-07-17 DIAGNOSIS — A09 Infectious gastroenteritis and colitis, unspecified: Secondary | ICD-10-CM | POA: Diagnosis not present

## 2017-07-17 DIAGNOSIS — D899 Disorder involving the immune mechanism, unspecified: Secondary | ICD-10-CM | POA: Diagnosis not present

## 2017-07-17 DIAGNOSIS — Z66 Do not resuscitate: Secondary | ICD-10-CM | POA: Diagnosis present

## 2017-07-17 LAB — CBC WITH DIFFERENTIAL/PLATELET
BASOS ABS: 0 10*3/uL (ref 0.0–0.1)
BASOS PCT: 0 %
Band Neutrophils: 0 %
Blasts: 0 %
Eosinophils Absolute: 0.2 10*3/uL (ref 0.0–0.7)
Eosinophils Relative: 2 %
HCT: 28 % — ABNORMAL LOW (ref 36.0–46.0)
HEMOGLOBIN: 8.4 g/dL — AB (ref 12.0–15.0)
Lymphocytes Relative: 21 %
Lymphs Abs: 1.6 10*3/uL (ref 0.7–4.0)
MCH: 25.1 pg — ABNORMAL LOW (ref 26.0–34.0)
MCHC: 30 g/dL (ref 30.0–36.0)
MCV: 83.8 fL (ref 78.0–100.0)
METAMYELOCYTES PCT: 1 %
MONO ABS: 1.5 10*3/uL — AB (ref 0.1–1.0)
MYELOCYTES: 1 %
Monocytes Relative: 20 %
Neutro Abs: 4.2 10*3/uL (ref 1.7–7.7)
Neutrophils Relative %: 55 %
Other: 0 %
PROMYELOCYTES RELATIVE: 0 %
Platelets: 347 10*3/uL (ref 150–400)
RBC: 3.34 MIL/uL — ABNORMAL LOW (ref 3.87–5.11)
RDW: 17.2 % — ABNORMAL HIGH (ref 11.5–15.5)
WBC: 7.5 10*3/uL (ref 4.0–10.5)
nRBC: 0 /100 WBC

## 2017-07-17 LAB — BASIC METABOLIC PANEL
ANION GAP: 10 (ref 5–15)
BUN: 25 mg/dL — ABNORMAL HIGH (ref 6–20)
CHLORIDE: 102 mmol/L (ref 101–111)
CO2: 23 mmol/L (ref 22–32)
Calcium: 9.2 mg/dL (ref 8.9–10.3)
Creatinine, Ser: 1.25 mg/dL — ABNORMAL HIGH (ref 0.44–1.00)
GFR, EST AFRICAN AMERICAN: 42 mL/min — AB (ref >60)
GFR, EST NON AFRICAN AMERICAN: 36 mL/min — AB (ref >60)
Glucose, Bld: 107 mg/dL — ABNORMAL HIGH (ref 65–99)
POTASSIUM: 4.5 mmol/L (ref 3.5–5.1)
SODIUM: 135 mmol/L (ref 135–145)

## 2017-07-17 LAB — POC OCCULT BLOOD, ED: Fecal Occult Bld: POSITIVE — AB

## 2017-07-17 LAB — MAGNESIUM: Magnesium: 1.8 mg/dL (ref 1.7–2.4)

## 2017-07-17 MED ORDER — SODIUM CHLORIDE 0.9 % IV BOLUS
1000.0000 mL | Freq: Once | INTRAVENOUS | Status: AC
Start: 2017-07-17 — End: 2017-07-17
  Administered 2017-07-17: 1000 mL via INTRAVENOUS

## 2017-07-17 MED ORDER — SODIUM CHLORIDE 0.9% FLUSH
10.0000 mL | INTRAVENOUS | Status: DC | PRN
  Administered 2017-07-24: 10 mL
  Filled 2017-07-17: qty 40

## 2017-07-17 MED ORDER — ONDANSETRON HCL 4 MG/2ML IJ SOLN
4.0000 mg | Freq: Four times a day (QID) | INTRAMUSCULAR | Status: DC | PRN
  Administered 2017-07-20: 4 mg via INTRAVENOUS
  Filled 2017-07-17: qty 2

## 2017-07-17 MED ORDER — HYDRALAZINE HCL 20 MG/ML IJ SOLN: 10.0000 mg | Freq: Four times a day (QID) | INTRAMUSCULAR | Status: DC | PRN

## 2017-07-17 MED ORDER — SODIUM CHLORIDE 0.9 % IV SOLN
INTRAVENOUS | Status: DC
  Administered 2017-07-17 – 2017-07-20 (×4): via INTRAVENOUS
  Administered 2017-07-20: 75 mL/h via INTRAVENOUS
  Administered 2017-07-20 – 2017-07-23 (×4): via INTRAVENOUS

## 2017-07-17 MED ORDER — ESTRADIOL 1 MG PO TABS
0.5000 mg | ORAL_TABLET | Freq: Every day | ORAL | Status: DC
  Administered 2017-07-18 – 2017-07-26 (×9): 0.5 mg via ORAL
  Filled 2017-07-17 (×9): qty 0.5

## 2017-07-17 MED ORDER — AMLODIPINE BESYLATE 5 MG PO TABS
5.0000 mg | ORAL_TABLET | Freq: Every day | ORAL | Status: DC
  Administered 2017-07-18: 5 mg via ORAL
  Filled 2017-07-17: qty 1

## 2017-07-17 MED ORDER — MORPHINE SULFATE (PF) 2 MG/ML IV SOLN
2.0000 mg | Freq: Once | INTRAVENOUS | Status: AC
  Administered 2017-07-17: 2 mg via INTRAVENOUS
  Filled 2017-07-17: qty 1

## 2017-07-17 MED ORDER — ONDANSETRON HCL 4 MG/2ML IJ SOLN
4.0000 mg | Freq: Once | INTRAMUSCULAR | Status: AC
  Administered 2017-07-17: 4 mg via INTRAVENOUS
  Filled 2017-07-17: qty 2

## 2017-07-17 MED ORDER — MORPHINE SULFATE (PF) 4 MG/ML IV SOLN: 1.0000 mg | INTRAVENOUS | Status: DC | PRN

## 2017-07-17 MED ORDER — POLYSACCHARIDE IRON COMPLEX 150 MG PO CAPS
150.0000 mg | ORAL_CAPSULE | Freq: Every day | ORAL | Status: DC
  Administered 2017-07-17 – 2017-07-26 (×10): 150 mg via ORAL
  Filled 2017-07-17 (×10): qty 1

## 2017-07-17 MED ORDER — MIRTAZAPINE 15 MG PO TABS
15.0000 mg | ORAL_TABLET | Freq: Every day | ORAL | Status: DC
  Administered 2017-07-17 – 2017-07-25 (×9): 15 mg via ORAL
  Filled 2017-07-17 (×9): qty 1

## 2017-07-17 MED ORDER — BUDESONIDE 3 MG PO CPEP
3.0000 mg | ORAL_CAPSULE | Freq: Every day | ORAL | Status: DC
  Administered 2017-07-18 – 2017-07-26 (×9): 3 mg via ORAL
  Filled 2017-07-17 (×9): qty 1

## 2017-07-17 MED ORDER — ACETAMINOPHEN 325 MG PO TABS
650.0000 mg | ORAL_TABLET | Freq: Four times a day (QID) | ORAL | Status: DC | PRN
Start: 2017-07-17 — End: 2017-07-26
  Administered 2017-07-18 – 2017-07-19 (×3): 650 mg via ORAL
  Filled 2017-07-17 (×3): qty 2

## 2017-07-17 MED ORDER — MORPHINE BOLUS VIA INFUSION: 1.0000 mg | INTRAVENOUS | Status: DC | PRN

## 2017-07-17 NOTE — ED Triage Notes (Signed)
Per EMS, pt from home has had GI bleeding x 5 days. Pt was seen yesterday for same. Pt went to PCP, who sent pt to ED d/t continued GI bleeding and orthostatic hypotension. Pt has hx of bladder cancer, gets chemo every 3 weeks. Pt denies pain. ED EKG unremarkable.  BP 114/63 HR 60

## 2017-07-17 NOTE — Consult Note (Signed)
Referring Provider: Dr. Nevada Crane Primary Care Physician:  [name], MD Primary Gastroenterologist:  Dr. Wynetta Emery  Reason for Consultation:  GI bleed; Ulcerative proctitis  HPI: Tiffany [name] is a 84 y.o. female with metastatic (lungs, liver, bones, and lymph nodes) bladder cancer on chemo who has a history of ulcerative proctitis (flex sig 2/18 by Dr. Wynetta Emery) and has been on Budesonide, prednisone rectal foam and mesalamine suppositories in the past without benefit per Dr. Durenda Age note in 2018. She normally has formed nonbloody stools but for the past 5 days she has been having multiple red loose stools per day without associated abdominal pain or rectal pain. States that she will have 2 large volume bloody stools each of these 5 days and then smaller volume bloody stools the rest of the time. Felt lightheaded. Seen in ER yesterday and Hgb was 8.8 initially and then 9.4 on recheck and she was sent home. She was orthostatic at Dr. Lina Sar office today and sent back to the hospital. Hgb 8.4. Feels weak. On Budesonide 3 mg/day.  Past Medical History:  Diagnosis Date  . Arthritis     knees 03-10-12 had Cortisone injection  . Cancer [facility]) june 2016   metastatic  . DDD (degenerative disc disease), cervical   . Edema leg    feet and ankles  . Esophagus disorder    Had esophagus stretched  . GERD (gastroesophageal reflux disease)    Benign stricture dilated in 2011  . Gout    Patient notes she has possible gout but has not been on chronic medications for this  . Headache(784.0)   . Hypertension   . Occipital neuralgia   . Occipital neuralgia    Related to cervical degenerative disc disease  . Peptic ulcer disease    Previous history in the remote past  . Ulcerative proctitis ([place])   . Ulcerative proctitis ([place]) 2014    Past Surgical History:  Procedure Laterality Date  . ABDOMINAL HYSTERECTOMY    . APPENDECTOMY    . CHOLECYSTECTOMY    . COLONOSCOPY WITH PROPOFOL  03/25/2012    Procedure: COLONOSCOPY WITH PROPOFOL;  Surgeon: Garlan Fair, MD;  [facility];  Service: Endoscopy;  Laterality: N/A;  . DILATION AND CURETTAGE OF UTERUS    . ESOPHAGEAL DILATION     For benign stricture in 2011  . ESOPHAGOGASTRODUODENOSCOPY    . FLEXIBLE SIGMOIDOSCOPY N/A 04/26/2014   Procedure: FLEXIBLE SIGMOIDOSCOPY - UnSedated;  Surgeon: Garlan Fair, MD;  [facility];  Service: Endoscopy;  Laterality: N/A;  . FLEXIBLE SIGMOIDOSCOPY N/A 01/24/2015   Procedure: FLEXIBLE SIGNMOIDOSCOPY W/ FLEET ENEMIA;  Surgeon: Garlan Fair, MD;  [facility];  Service: Endoscopy;  Laterality: N/A;  . FLEXIBLE SIGMOIDOSCOPY N/A 05/15/2016   Procedure: FLEXIBLE SIGMOIDOSCOPY;  Surgeon: Garlan Fair, MD;  [facility];  Service: Endoscopy;  Laterality: N/A;  pt needs to ahve an enema upon arrival   . NECK SURGERY     20 years ago  . TOTAL ABDOMINAL HYSTERECTOMY W/ BILATERAL SALPINGOOPHORECTOMY     At age 84 due to miscarriage and retained products of conception causing significant uterine bleeding. Patient has been on estrogen replacement therapy since then.    Prior to Admission medications   Medication Sig Start Date End Date Taking? Authorizing Provider  acetaminophen (TYLENOL) 650 MG CR tablet Take 1,300 mg by mouth 2 (two) times daily.   Yes [provider]  amLODipine (NORVASC) 5 MG tablet Take 5 mg by mouth  daily.   Yes [provider]  benzonatate (TESSALON) 100 MG capsule Take 1 capsule (100 mg total) by mouth 3 (three) times daily as needed for cough. 05/03/17  Yes [name], MD  budesonide (ENTOCORT EC) 3 MG 24 hr capsule Take 1 capsule (3 mg total) by mouth daily. Take once daily for 7-10 days then every other day for 10 days then stop if no GI bleeding. 07/04/17  Yes [name], MD  diclofenac sodium (VOLTAREN) 1 % GEL APPLY 2 TO 4 GRAMS Q 6 H PRN FOR PAIN 06/20/17  Yes [provider]  estradiol  (ESTRACE) 0.5 MG tablet Take 0.5 mg by mouth daily. 08/02/14  Yes [provider]  iron polysaccharides (NIFEREX) 150 MG capsule Take 1 capsule (150 mg total) by mouth daily. 07/04/17  Yes [name], MD  mirtazapine (REMERON) 15 MG tablet TAKE 1 TABLET(15 MG) BY MOUTH AT BEDTIME 06/20/17  Yes [name], MD  PRESCRIPTION MEDICATION Chemo card   Yes [provider]  senna-docusate (SENNA S) 8.6-50 MG per tablet Take 2 tablets by mouth at bedtime. Patient taking differently: Take 1-2 tablets by mouth daily as needed for mild constipation.  11/30/14  Yes [name], MD  triamcinolone ointment (KENALOG) 0.5 % Apply 1 application topically 2 (two) times daily. 05/03/17  Yes [name], MD  vitamin B-12 (CYANOCOBALAMIN) 100 MCG tablet Take 100 mcg by mouth daily.   Yes [provider]  Respiratory Therapy Supplies (FLUTTER) DEVI 1 each by Does not apply route daily. 07/06/15   Tanda Rockers, MD    Scheduled Meds: Continuous Infusions: PRN Meds:.  Allergies as of 07/17/2017 - Review Complete 07/17/2017  Allergen Reaction Noted  . Nitrofurantoin Other (See Comments) 11/18/2014  . Indomethacin Swelling and Other (See Comments) 03/13/2012  . Lisinopril Swelling and Other (See Comments) 03/25/2012    Family History  Problem Relation Age of Onset  . Lung cancer Sister   . Prostate cancer Brother   . Uterine cancer Maternal Aunt   . Breast cancer Paternal Grandmother   . Hodgkin's lymphoma Sister     Social History   Socioeconomic History  . Marital status: Married    Spouse name: Not on file  . Number of children: Not on file  . Years of education: Not on file  . Highest education level: Not on file  Occupational History  . Not on file  Social Needs  . Financial resource strain: Not on file  . Food insecurity:    Worry: Not on file    Inability: Not on file  . Transportation needs:    Medical: Not on file    Non-medical:  Not on file  Tobacco Use  . Smoking status: Former Smoker    Last attempt to quit: 11/01/1983    Years since quitting: 33.7  . Smokeless tobacco: Never Used  Substance and Sexual Activity  . Alcohol use: Yes    Comment: occ  . Drug use: No  . Sexual activity: Never  Lifestyle  . Physical activity:    Days per week: Not on file    Minutes per session: Not on file  . Stress: Not on file  Relationships  . Social connections:    Talks on phone: Not on file    Gets together: Not on file    Attends religious service: Not on file    Active member of club or organization: Not on file  Attends meetings of clubs or organizations: Not on file    Relationship status: Not on file  . Intimate partner violence:    Fear of current or ex partner: Not on file    Emotionally abused: Not on file    Physically abused: Not on file    Forced sexual activity: Not on file  Other Topics Concern  . Not on file  Social History Narrative  . Not on file    Review of Systems: All negative except as stated above in HPI.  Physical Exam: Vital signs: Vitals:   07/17/17 1229 07/17/17 1614  BP: (!) 130/58 (!) 118/54  Pulse: 96 81  Resp: 16 (!) 23  Temp: 98.3 F (36.8 C)   SpO2: 94% 94%     General: Lethargic, pale, elderly, thin, no acute distress Head: normocephalic, atraumatic Eyes: anicteric sclera ENT: oropharynx clear Neck: supple, nontender Lungs:  Clear throughout to auscultation.   No wheezes, crackles, or rhonchi. No acute distress. Heart:  Regular rate and rhythm; no murmurs, clicks, rubs,  or gallops. Abdomen: soft, nontender, nondistended, +BS  Rectal:  Deferred Ext: no edema  GI:  Lab Results: Recent Labs    07/16/17 0915 07/16/17 1319 07/17/17 1341  WBC 7.6 9.4 7.5  HGB 8.8* 9.4* 8.4*  HCT 29.2* 31.1* 28.0*  PLT 339 363 347   BMET Recent Labs    07/16/17 0915 07/17/17 1341  NA 136 135  K 4.6 4.5  CL 103 102  CO2 23 23  GLUCOSE 97 107*  BUN 21* 25*   CREATININE 1.09* 1.25*  CALCIUM 9.4 9.2   LFT Recent Labs    07/16/17 0915  PROT 7.5  ALBUMIN 3.0*  AST 17  ALT 11*  ALKPHOS 39  BILITOT 0.6   PT/INR No results for input(s): LABPROT, INR in the last 72 hours.   Studies/Results: No results found.  Impression/Plan: 84 yo with history of ulcerative proctitis on Budesonide 3 mg/day who has metastatic bladder cancer presenting with bloody diarrhea X 5 days and weakness. Hgb of 8.4 (8.8 in March 2019). Supportive care. Increased risk for C. Diff so would hold off on IV steroids for UC until C. Diff ruled out. Recommend GI pathogen panel. Clear liquid diet ok. Repeat flex sig not indicated at this time. Will follow in consultation.     LOS: 0 days   [place] C.  07/17/2017, 5:06 PM  Questions please call [phone]

## 2017-07-17 NOTE — ED Notes (Signed)
RN cleaned pt after she had a BM in her brief. New brief placed on pt.

## 2017-07-17 NOTE — ED Notes (Signed)
Bed: DI97 Expected date:  Expected time:  Means of arrival:  Comments: EMS/G.I. bleed

## 2017-07-17 NOTE — ED Notes (Signed)
ED TO INPATIENT HANDOFF REPORT  Name/Age/Gender Tiffany [name] 84 y.o. female  Code Status    Code Status Orders  (From admission, onward)        Start     Ordered   07/17/17 1716  Do not attempt resuscitation (DNR)  Continuous    Question Answer Comment  In the event of cardiac or respiratory ARREST Do not call a "code blue"   In the event of cardiac or respiratory ARREST Do not perform Intubation, CPR, defibrillation or ACLS   In the event of cardiac or respiratory ARREST Use medication by any route, position, wound care, and other measures to relive pain and suffering. May use oxygen, suction and manual treatment of airway obstruction as needed for comfort.      07/17/17 1717    Code Status History    Date Active Date Inactive Code Status Order ID Comments User Context   10/16/2014 2010 10/18/2014 1402 Full Code [id number]  Etta Quill, DO ED      Home/SNF/Other Home  Chief Complaint possible GI bleed   Level of Care/Admitting Diagnosis ED Disposition    ED Disposition Condition [facility] Area: [facility] [[id number]]  Level of Care: Telemetry [5]  Admit to tele based on following criteria: Monitor for Ischemic changes  Diagnosis: Diarrhea in adult patient [248250]  Admitting Physician: Kayleen Memos [[id number]]  Attending Physician: Kayleen Memos [[id number]]  Estimated length of stay: past midnight tomorrow  Certification:: I certify this patient will need inpatient services for at least 2 midnights  PT Class (Do Not Modify): Inpatient [101]  PT Acc Code (Do Not Modify): Private [1]       Medical History Past Medical History:  Diagnosis Date  . Arthritis     knees 03-10-12 had Cortisone injection  . Cancer [facility]) june 2016   metastatic  . DDD (degenerative disc disease), cervical   . Edema leg    feet and ankles  . Esophagus disorder    Had esophagus stretched  . GERD (gastroesophageal reflux disease)    Benign  stricture dilated in 2011  . Gout    Patient notes she has possible gout but has not been on chronic medications for this  . Headache(784.0)   . Hypertension   . Occipital neuralgia   . Occipital neuralgia    Related to cervical degenerative disc disease  . Peptic ulcer disease    Previous history in the remote past  . Ulcerative proctitis ([place])   . Ulcerative proctitis ([place]) 2014    Allergies Allergies  Allergen Reactions  . Nitrofurantoin Other (See Comments)    Trembling  . Indomethacin Swelling and Other (See Comments)    Tongue swelling  . Lisinopril Swelling and Other (See Comments)    Tongue swelling    IV Location/Drains/Wounds Patient Lines/Drains/Airways Status   Active Line/Drains/Airways    Name:   Placement date:   Placement time:   Site:   Days:   Implanted Port 01/03/15 Right Chest   01/03/15    1236    Chest   926   Peripheral IV 07/16/17 Right Forearm   07/16/17    0916    Forearm   1          Labs/Imaging Results for orders placed or performed during the hospital encounter of 07/17/17 (from the past 48 hour(s))  POC occult blood, ED     Status: Abnormal   Collection Time: 07/17/17  12:36 PM  Result Value Ref Range   Fecal Occult Bld POSITIVE (A) NEGATIVE  CBC with Differential     Status: Abnormal   Collection Time: 07/17/17  1:41 PM  Result Value Ref Range   WBC 7.5 4.0 - 10.5 K/uL   RBC 3.34 (L) 3.87 - 5.11 MIL/uL   Hemoglobin 8.4 (L) 12.0 - 15.0 g/dL   HCT 28.0 (L) 36.0 - 46.0 %   MCV 83.8 78.0 - 100.0 fL   MCH 25.1 (L) 26.0 - 34.0 pg   MCHC 30.0 30.0 - 36.0 g/dL   RDW 17.2 (H) 11.5 - 15.5 %   Platelets 347 150 - 400 K/uL   Neutrophils Relative % 55 %   Lymphocytes Relative 21 %   Monocytes Relative 20 %   Eosinophils Relative 2 %   Basophils Relative 0 %   Band Neutrophils 0 %   Metamyelocytes Relative 1 %   Myelocytes 1 %   Promyelocytes Relative 0 %   Blasts 0 %   nRBC 0 0 /100 WBC   Other 0 %   Neutro Abs 4.2 1.7 - 7.7 K/uL    Lymphs Abs 1.6 0.7 - 4.0 K/uL   Monocytes Absolute 1.5 (H) 0.1 - 1.0 K/uL   Eosinophils Absolute 0.2 0.0 - 0.7 K/uL   Basophils Absolute 0.0 0.0 - 0.1 K/uL   Smear Review MORPHOLOGY UNREMARKABLE     Comment: Performed at [facility], [place] [address] [place], [place] [address]  Basic metabolic panel     Status: Abnormal   Collection Time: 07/17/17  1:41 PM  Result Value Ref Range   Sodium 135 135 - 145 mmol/L   Potassium 4.5 3.5 - 5.1 mmol/L   Chloride 102 101 - 111 mmol/L   CO2 23 22 - 32 mmol/L   Glucose, Bld 107 (H) 65 - 99 mg/dL   BUN 25 (H) 6 - 20 mg/dL   Creatinine, Ser 1.25 (H) 0.44 - 1.00 mg/dL   Calcium 9.2 8.9 - 10.3 mg/dL   GFR calc non Af Amer 36 (L) >60 mL/min   GFR calc Af Amer 42 (L) >60 mL/min    Comment: (NOTE) The eGFR has been calculated using the CKD EPI equation. This calculation has not been validated in all clinical situations. eGFR's persistently <60 mL/min signify possible Chronic Kidney Disease.    Anion gap 10 5 - 15    Comment: Performed at [facility], [place] [address] [place], [place] [address]  Magnesium     Status: None   Collection Time: 07/17/17  1:41 PM  Result Value Ref Range   Magnesium 1.8 1.7 - 2.4 mg/dL    Comment: Performed at [facility], [place] [address], [place], [place] [address]   No results found.  Pending Labs Unresulted Labs (From admission, onward)   None      Vitals/Pain Today's Vitals   07/17/17 1229 07/17/17 1230 07/17/17 1614  BP: (!) 130/58  (!) 118/54  Pulse: 96  81  Resp: 16  (!) 23  Temp: 98.3 F (36.8 C)    TempSrc: Oral    SpO2: 94%  94%  PainSc:  0-No pain     Isolation Precautions No active isolations  Medications Medications  sodium chloride 0.9 % bolus 1,000 mL (1,000 mLs Intravenous New Bag/Given 07/17/17 1340)  morphine 2 MG/ML injection 2 mg (2 mg Intravenous Given 07/17/17 1340)  ondansetron (ZOFRAN) injection 4 mg (4 mg Intravenous  Given  07/17/17 1340)    Mobility walks

## 2017-07-17 NOTE — ED Provider Notes (Signed)
[facility] DEPT Provider Note   CSN: [id number] Arrival date & time: 07/17/17  1211     History   Chief Complaint Chief Complaint  Patient presents with  . GI Bleeding    HPI GIORDANA WEINHEIMER is a 84 y.o. female.  84 yo F with a chief complaint diarrhea.  Ongoing for the past couple days.  Came to the ED yesterday and was discharged home.  Saw her PCP who sent her here for admission.  Patient is felt generally weak at home.  Describes her stool is soft and brown mixed with some blood.  She denies abdominal pain denies fevers or vomiting.  Denies trauma.  The history is provided by the patient.  Illness  This is a new problem. The current episode started yesterday. The problem occurs constantly. The problem has not changed since onset.Pertinent negatives include no chest pain, no headaches and no shortness of breath. Nothing aggravates the symptoms. Nothing relieves the symptoms. She has tried nothing for the symptoms. The treatment provided no relief.    Past Medical History:  Diagnosis Date  . Arthritis     knees 03-10-12 had Cortisone injection  . Cancer [facility]) june 2016   metastatic  . DDD (degenerative disc disease), cervical   . Edema leg    feet and ankles  . Esophagus disorder    Had esophagus stretched  . GERD (gastroesophageal reflux disease)    Benign stricture dilated in 2011  . Gout    Patient notes she has possible gout but has not been on chronic medications for this  . Headache(784.0)   . Hypertension   . Occipital neuralgia   . Occipital neuralgia    Related to cervical degenerative disc disease  . Peptic ulcer disease    Previous history in the remote past  . Ulcerative proctitis ([place])   . Ulcerative proctitis [facility]) 2014    Patient Active Problem List   Diagnosis Date Noted  . Port catheter in place 07/21/2015  . Bronchiolitis 07/01/2015  . Bronchiectasis ([place]) with probable Assoc MAI  06/10/2015  . Liver metastases  ([place]) 05/22/2015  . Bone metastases ([place]) 05/22/2015  . Bronchopneumonia 04/29/2015  . Insomnia 04/08/2015  . 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 ABDOMINAL HYSTERECTOMY    . APPENDECTOMY    . CHOLECYSTECTOMY    . COLONOSCOPY WITH PROPOFOL  03/25/2012   Procedure: COLONOSCOPY WITH PROPOFOL;  Surgeon: Garlan Fair, MD;  Location: [facility];  Service: Endoscopy;  Laterality: N/A;  . DILATION AND CURETTAGE OF UTERUS    . ESOPHAGEAL DILATION     For benign stricture in 2011  . ESOPHAGOGASTRODUODENOSCOPY    . FLEXIBLE SIGMOIDOSCOPY N/A 04/26/2014   Procedure: FLEXIBLE SIGMOIDOSCOPY - UnSedated;  Surgeon: Garlan Fair, MD;  [facility];  Service: Endoscopy;  Laterality: N/A;  . FLEXIBLE SIGMOIDOSCOPY N/A 01/24/2015   Procedure: FLEXIBLE SIGNMOIDOSCOPY W/ FLEET ENEMIA;  Surgeon: Garlan Fair, MD;  [facility];  Service: Endoscopy;  Laterality: N/A;  . FLEXIBLE SIGMOIDOSCOPY N/A 05/15/2016   Procedure: FLEXIBLE SIGMOIDOSCOPY;  Surgeon: Garlan Fair, MD;  [facility];  Service: Endoscopy;  Laterality: N/A;  pt needs to ahve an enema upon arrival   . NECK SURGERY     20 years ago  . TOTAL ABDOMINAL HYSTERECTOMY W/ BILATERAL SALPINGOOPHORECTOMY     At age 55 due to miscarriage and retained products  of conception causing significant uterine bleeding. Patient has been on estrogen replacement therapy since then.     OB History   None      Home Medications    Prior to Admission medications   Medication Sig Start Date End Date Taking? Authorizing Provider  acetaminophen (TYLENOL) 650 MG CR tablet Take 1,300 mg by mouth 2 (two) times daily.   Yes [provider]  amLODipine (NORVASC) 5 MG tablet Take 5 mg by mouth daily.   Yes [provider]  benzonatate (TESSALON) 100 MG capsule Take 1 capsule (100 mg total) by mouth 3 (three) times daily as  needed for cough. 05/03/17  Yes [name], MD  budesonide (ENTOCORT EC) 3 MG 24 hr capsule Take 1 capsule (3 mg total) by mouth daily. Take once daily for 7-10 days then every other day for 10 days then stop if no GI bleeding. 07/04/17  Yes [name], MD  diclofenac sodium (VOLTAREN) 1 % GEL APPLY 2 TO 4 GRAMS Q 6 H PRN FOR PAIN 06/20/17  Yes [provider]  estradiol (ESTRACE) 0.5 MG tablet Take 0.5 mg by mouth daily. 08/02/14  Yes [provider]  iron polysaccharides (NIFEREX) 150 MG capsule Take 1 capsule (150 mg total) by mouth daily. 07/04/17  Yes [name], MD  mirtazapine (REMERON) 15 MG tablet TAKE 1 TABLET(15 MG) BY MOUTH AT BEDTIME 06/20/17  Yes [name], MD  PRESCRIPTION MEDICATION Chemo card   Yes [provider]  senna-docusate (SENNA S) 8.6-50 MG per tablet Take 2 tablets by mouth at bedtime. Patient taking differently: Take 1-2 tablets by mouth daily as needed for mild constipation.  11/30/14  Yes [name], MD  triamcinolone ointment (KENALOG) 0.5 % Apply 1 application topically 2 (two) times daily. 05/03/17  Yes [name], MD  vitamin B-12 (CYANOCOBALAMIN) 100 MCG tablet Take 100 mcg by mouth daily.   Yes [provider]  Respiratory Therapy Supplies (FLUTTER) DEVI 1 each by Does not apply route daily. 07/06/15   Tanda Rockers, MD    Family History Family History  Problem Relation Age of Onset  . Lung cancer Sister   . Prostate cancer Brother   . Uterine cancer Maternal Aunt   . Breast cancer Paternal Grandmother   . Hodgkin's lymphoma Sister     Social History Social History   Tobacco Use  . Smoking status: Former Smoker    Last attempt to quit: 11/01/1983    Years since quitting: 33.7  . Smokeless tobacco: Never Used  Substance Use Topics  . Alcohol use: Yes    Comment: occ  . Drug use: No     Allergies   Nitrofurantoin; Indomethacin; and Lisinopril   Review of  Systems Review of Systems  Constitutional: Negative for chills and fever.  HENT: Negative for congestion and rhinorrhea.   Eyes: Negative for redness and visual disturbance.  Respiratory: Negative for shortness of breath and wheezing.   Cardiovascular: Negative for chest pain and palpitations.  Gastrointestinal: Positive for blood in stool and diarrhea. Negative for nausea and vomiting.  Genitourinary: Negative for dysuria and urgency.  Musculoskeletal: Negative for arthralgias and myalgias.  Skin: Negative for pallor and wound.  Neurological: Negative for dizziness and headaches.     Physical Exam Updated Vital Signs BP (!) 130/58   Pulse 96   Temp 98.3 F (36.8 C) (Oral)   Resp 16   SpO2 94%   Physical Exam  Constitutional: She is oriented to person, place, and time. She appears well-developed and well-nourished. No distress.  HENT:  Head: Normocephalic and atraumatic.  Eyes: Pupils are equal, round, and reactive to light. EOM are normal.  Neck: Normal range of motion. Neck supple.  Cardiovascular: Normal rate and regular rhythm. Exam reveals no gallop and no friction rub.  No murmur heard. Pulmonary/Chest: Effort normal. She has no wheezes. She has no rales.  Abdominal: Soft. She exhibits no distension and no mass. There is no tenderness. There is no guarding.  Genitourinary:  Genitourinary Comments: Brown soft stool  Musculoskeletal: She exhibits no edema or tenderness.  Neurological: She is alert and oriented to person, place, and time.  Skin: Skin is warm and dry. She is not diaphoretic.  Psychiatric: She has a normal mood and affect. Her behavior is normal.  Nursing note and vitals reviewed.    ED Treatments / Results  Labs (all labs ordered are listed, but only abnormal results are displayed) Labs Reviewed  CBC WITH DIFFERENTIAL/PLATELET - Abnormal; Notable for the following components:      Result Value   RBC 3.34 (*)    Hemoglobin 8.4 (*)    HCT 28.0 (*)     MCH 25.1 (*)    RDW 17.2 (*)    Monocytes Absolute 1.5 (*)    All other components within normal limits  BASIC METABOLIC PANEL - Abnormal; Notable for the following components:   Glucose, Bld 107 (*)    BUN 25 (*)    Creatinine, Ser 1.25 (*)    GFR calc non Af Amer 36 (*)    GFR calc Af Amer 42 (*)    All other components within normal limits  POC OCCULT BLOOD, ED - Abnormal; Notable for the following components:   Fecal Occult Bld POSITIVE (*)    All other components within normal limits  MAGNESIUM    EKG None  Radiology No results found.  Procedures Procedures (including critical care time)  Medications Ordered in ED Medications  sodium chloride 0.9 % bolus 1,000 mL (1,000 mLs Intravenous New Bag/Given 07/17/17 1340)  morphine 2 MG/ML injection 2 mg (2 mg Intravenous Given 07/17/17 1340)  ondansetron (ZOFRAN) injection 4 mg (4 mg Intravenous Given 07/17/17 1340)     Initial Impression / Assessment and Plan / ED Course  I have reviewed the triage vital signs and the nursing notes.  Pertinent labs & imaging results that were available during my care of the patient were reviewed by me and considered in my medical decision making (see chart for details).     84 yo F with a chief complaint of diarrhea and weakness.  Going on for the past few days.  Was seen in the ED yesterday had a stable hemoglobin and was discharged home.  The patient has had continued diarrhea at home.  She feels she is having trouble taking care of herself.  Went to her PCP today who found her to be orthostatic and sent her here for admission.  Labs are at baseline.  Discussed with the hospitalist.  Final Clinical Impressions(s) / ED Diagnoses   Final diagnoses:  None    ED Discharge Orders    None       Deno Etienne, DO 07/17/17 1522

## 2017-07-17 NOTE — H&P (Signed)
History and Physical  Tiffany [name] OVF:[id number] DOB: 09/09/1924 DOA: 07/17/2017  Referring physician: Dr Tyrone Nine PCP: [name], MD  Outpatient Specialists:  Patient coming from: Home  Chief Complaint: Weakness and diarrhea  HPI: Tiffany [name] is a 84 y.o. female with medical history significant for transitional cell carcinoma of renal pelvis, liver metastases, hypertension who presented to [facility] ED with complaints of generalized weakness, diarrhea of 3-4 days duration, and poor oral intake.  Patient went to her PCP this morning.  Had positive orthostatic vital signs.  Recommended that she comes to the ED for further evaluation.  Reports last chemotherapy was on July 04, 2017 and her next chemotherapy is on July 25, 2017.  She denies vomiting,subjective fevers, or chills.  ED Course: Positive fecal occult blood test in the ED.  Drop in her hemoglobin from 9.4 yesterday to 8.4 today. Baseline Hg 10.0. GI contacted by the ED physician.  Review of Systems: Review of systems as stated in the HPI.  All other systems reviewed and are negative.  Past Medical History:  Diagnosis Date  . Arthritis     knees 03-10-12 had Cortisone injection  . Cancer [facility]) june 2016   metastatic  . DDD (degenerative disc disease), cervical   . Edema leg    feet and ankles  . Esophagus disorder    Had esophagus stretched  . GERD (gastroesophageal reflux disease)    Benign stricture dilated in 2011  . Gout    Patient notes she has possible gout but has not been on chronic medications for this  . Headache(784.0)   . Hypertension   . Occipital neuralgia   . Occipital neuralgia    Related to cervical degenerative disc disease  . Peptic ulcer disease    Previous history in the remote past  . Ulcerative proctitis ([place])   . Ulcerative proctitis ([place]) 2014   Past Surgical History:  Procedure Laterality Date  . ABDOMINAL HYSTERECTOMY    . APPENDECTOMY    . CHOLECYSTECTOMY    . COLONOSCOPY WITH PROPOFOL   03/25/2012   Procedure: COLONOSCOPY WITH PROPOFOL;  Surgeon: Garlan Fair, MD;  [facility];  Service: Endoscopy;  Laterality: N/A;  . DILATION AND CURETTAGE OF UTERUS    . ESOPHAGEAL DILATION     For benign stricture in 2011  . ESOPHAGOGASTRODUODENOSCOPY    . FLEXIBLE SIGMOIDOSCOPY N/A 04/26/2014   Procedure: FLEXIBLE SIGMOIDOSCOPY - UnSedated;  Surgeon: Garlan Fair, MD;  [facility];  Service: Endoscopy;  Laterality: N/A;  . FLEXIBLE SIGMOIDOSCOPY N/A 01/24/2015   Procedure: FLEXIBLE SIGNMOIDOSCOPY W/ FLEET ENEMIA;  Surgeon: Garlan Fair, MD;  [facility];  Service: Endoscopy;  Laterality: N/A;  . FLEXIBLE SIGMOIDOSCOPY N/A 05/15/2016   Procedure: FLEXIBLE SIGMOIDOSCOPY;  Surgeon: Garlan Fair, MD;  [facility];  Service: Endoscopy;  Laterality: N/A;  pt needs to ahve an enema upon arrival   . NECK SURGERY     20 years ago  . TOTAL ABDOMINAL HYSTERECTOMY W/ BILATERAL SALPINGOOPHORECTOMY     At age 24 due to miscarriage and retained products of conception causing significant uterine bleeding. Patient has been on estrogen replacement therapy since then.    Social History:  reports that she quit smoking about 33 years ago. She has never used smokeless tobacco. She reports that she drinks alcohol. She reports that she does not use drugs.   Allergies  Allergen Reactions  . Nitrofurantoin Other (See Comments)    Trembling  .  Indomethacin Swelling and Other (See Comments)    Tongue swelling  . Lisinopril Swelling and Other (See Comments)    Tongue swelling    Family History  Problem Relation Age of Onset  . Lung cancer Sister   . Prostate cancer Brother   . Uterine cancer Maternal Aunt   . Breast cancer Paternal Grandmother   . Hodgkin's lymphoma Sister      Prior to Admission medications   Medication Sig Start Date End Date Taking? Authorizing Provider  acetaminophen (TYLENOL) 650 MG CR tablet Take 1,300 mg by mouth 2  (two) times daily.   Yes [provider]  amLODipine (NORVASC) 5 MG tablet Take 5 mg by mouth daily.   Yes [provider]  benzonatate (TESSALON) 100 MG capsule Take 1 capsule (100 mg total) by mouth 3 (three) times daily as needed for cough. 05/03/17  Yes [name], MD  budesonide (ENTOCORT EC) 3 MG 24 hr capsule Take 1 capsule (3 mg total) by mouth daily. Take once daily for 7-10 days then every other day for 10 days then stop if no GI bleeding. 07/04/17  Yes [name], MD  diclofenac sodium (VOLTAREN) 1 % GEL APPLY 2 TO 4 GRAMS Q 6 H PRN FOR PAIN 06/20/17  Yes [provider]  estradiol (ESTRACE) 0.5 MG tablet Take 0.5 mg by mouth daily. 08/02/14  Yes [provider]  iron polysaccharides (NIFEREX) 150 MG capsule Take 1 capsule (150 mg total) by mouth daily. 07/04/17  Yes [name], MD  mirtazapine (REMERON) 15 MG tablet TAKE 1 TABLET(15 MG) BY MOUTH AT BEDTIME 06/20/17  Yes [name], MD  PRESCRIPTION MEDICATION Chemo card   Yes [provider]  senna-docusate (SENNA S) 8.6-50 MG per tablet Take 2 tablets by mouth at bedtime. Patient taking differently: Take 1-2 tablets by mouth daily as needed for mild constipation.  11/30/14  Yes [name], MD  triamcinolone ointment (KENALOG) 0.5 % Apply 1 application topically 2 (two) times daily. 05/03/17  Yes [name], MD  vitamin B-12 (CYANOCOBALAMIN) 100 MCG tablet Take 100 mcg by mouth daily.   Yes [provider]  Respiratory Therapy Supplies (FLUTTER) DEVI 1 each by Does not apply route daily. 07/06/15   Tanda Rockers, MD    Physical Exam: BP (!) 140/58 (BP Location: Left Arm)   Pulse 81   Temp 98.9 F (37.2 C) (Oral)   Resp 20   SpO2 93%   General: A 84 year old female frail in no acute distress.  Alert and oriented x3 Eyes: Anicteric sclera ENT: Mucous membrane dry with no erythema or exudates Neck: No JVD or  thyromegaly Cardiovascular: Regular rate and rhythm with no rubs or gallops. Respiratory: Chest auscultation with no wheezes or rales. Abdomen: Soft nontender nondistended normal bowel sounds x4. Skin: No rashes or ulcerative lesions noted. Musculoskeletal: Trace edema in lower extremities bilaterally Psychiatric: Multiple of condition excellent. Neurologic: No apparent focal motor deficit.  Alert and oriented x3.          Labs on Admission:  Basic Metabolic Panel: Recent Labs  Lab 07/16/17 0915 07/17/17 1341  NA 136 135  K 4.6 4.5  CL 103 102  CO2 23 23  GLUCOSE 97 107*  BUN 21* 25*  CREATININE 1.09* 1.25*  CALCIUM 9.4 9.2  MG  --  1.8   Liver Function Tests: Recent Labs  Lab 07/16/17 0915  AST 17  ALT 11*  ALKPHOS 39  BILITOT 0.6  PROT 7.5  ALBUMIN 3.0*   No results for input(s): LIPASE, AMYLASE in the last 168 hours. No results for input(s): AMMONIA in the last 168 hours. CBC: Recent Labs  Lab 07/16/17 0915 07/16/17 1319 07/17/17 1341  WBC 7.6 9.4 7.5  NEUTROABS  --   --  4.2  HGB 8.8* 9.4* 8.4*  HCT 29.2* 31.1* 28.0*  MCV 84.4 85.0 83.8  PLT 339 363 347   Cardiac Enzymes: No results for input(s): CKTOTAL, CKMB, CKMBINDEX, TROPONINI in the last 168 hours.  BNP (last 3 results) No results for input(s): BNP in the last 8760 hours.  ProBNP (last 3 results) No results for input(s): PROBNP in the last 8760 hours.  CBG: No results for input(s): GLUCAP in the last 168 hours.  Radiological Exams on Admission: No results found.  EKG: Independently reviewed.  Reviewed sinus rhythm with rate of 87.  Assessment/Plan Present on Admission: . 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 Follows up with oncology Last chemotherapy on July 04, 2017 Next chemotherapy April 25th, 2019  Generalized weakness/physical debility/severe protein malnutrition Encourage oral intake  Increase protein calorie intake PT to evaluate Consult dietitian if no improvement  Hypertension Continue antihypertensive home medication Hydralazine IV prn for SBP>180 or DBP>105     DVT prophylaxis: SCDs  Code Status: DNR  Family Communication: Husband at bedside  Disposition Plan: Admit to telemetry floor  Consults called: GI  Admission status: Inpatient    Kayleen Memos MD [facility] Pager [phone]  If 7PM-7AM, please contact night-coverage [email or web address] Password [email or web address]  07/17/2017, 7:05 PM

## 2017-07-18 ENCOUNTER — Inpatient Hospital Stay (HOSPITAL_COMMUNITY): Payer: Medicare Other

## 2017-07-18 DIAGNOSIS — N179 Acute kidney failure, unspecified: Secondary | ICD-10-CM

## 2017-07-18 DIAGNOSIS — N183 Chronic kidney disease, stage 3 (moderate): Secondary | ICD-10-CM

## 2017-07-18 DIAGNOSIS — C799 Secondary malignant neoplasm of unspecified site: Secondary | ICD-10-CM

## 2017-07-18 DIAGNOSIS — D62 Acute posthemorrhagic anemia: Secondary | ICD-10-CM

## 2017-07-18 LAB — CBC
HEMATOCRIT: 27.2 % — AB (ref 36.0–46.0)
HEMOGLOBIN: 8.2 g/dL — AB (ref 12.0–15.0)
MCH: 25.5 pg — ABNORMAL LOW (ref 26.0–34.0)
MCHC: 30.1 g/dL (ref 30.0–36.0)
MCV: 84.7 fL (ref 78.0–100.0)
Platelets: 329 10*3/uL (ref 150–400)
RBC: 3.21 MIL/uL — ABNORMAL LOW (ref 3.87–5.11)
RDW: 17.3 % — ABNORMAL HIGH (ref 11.5–15.5)
WBC: 8.7 10*3/uL (ref 4.0–10.5)

## 2017-07-18 LAB — URINALYSIS, ROUTINE W REFLEX MICROSCOPIC
Bilirubin Urine: NEGATIVE
GLUCOSE, UA: NEGATIVE mg/dL
KETONES UR: NEGATIVE mg/dL
LEUKOCYTES UA: NEGATIVE
NITRITE: NEGATIVE
PROTEIN: 30 mg/dL — AB
Specific Gravity, Urine: 1.014 (ref 1.005–1.030)
pH: 5 (ref 5.0–8.0)

## 2017-07-18 LAB — COMPREHENSIVE METABOLIC PANEL
ALBUMIN: 2.6 g/dL — AB (ref 3.5–5.0)
ALK PHOS: 39 U/L (ref 38–126)
ALT: 10 U/L — ABNORMAL LOW (ref 14–54)
AST: 17 U/L (ref 15–41)
Anion gap: 9 (ref 5–15)
BUN: 18 mg/dL (ref 6–20)
CO2: 23 mmol/L (ref 22–32)
Calcium: 8.8 mg/dL — ABNORMAL LOW (ref 8.9–10.3)
Chloride: 106 mmol/L (ref 101–111)
Creatinine, Ser: 1.09 mg/dL — ABNORMAL HIGH (ref 0.44–1.00)
GFR calc Af Amer: 50 mL/min — ABNORMAL LOW (ref >60)
GFR calc non Af Amer: 43 mL/min — ABNORMAL LOW (ref >60)
GLUCOSE: 105 mg/dL — AB (ref 65–99)
POTASSIUM: 4.5 mmol/L (ref 3.5–5.1)
SODIUM: 138 mmol/L (ref 135–145)
Total Bilirubin: 0.6 mg/dL (ref 0.3–1.2)
Total Protein: 6.9 g/dL (ref 6.5–8.1)

## 2017-07-18 LAB — IRON AND TIBC
Iron: 14 ug/dL — ABNORMAL LOW (ref 28–170)
Saturation Ratios: 5 % — ABNORMAL LOW (ref 10.4–31.8)
TIBC: 266 ug/dL (ref 250–450)
UIBC: 252 ug/dL

## 2017-07-18 LAB — C DIFFICILE QUICK SCREEN W PCR REFLEX
C DIFFICILE (CDIFF) INTERP: NOT DETECTED
C Diff antigen: NEGATIVE
C Diff toxin: NEGATIVE

## 2017-07-18 LAB — GASTROINTESTINAL PANEL BY PCR, STOOL (REPLACES STOOL CULTURE)
ASTROVIRUS: NOT DETECTED
Adenovirus F40/41: NOT DETECTED
Campylobacter species: NOT DETECTED
Cryptosporidium: NOT DETECTED
Cyclospora cayetanensis: NOT DETECTED
ENTEROTOXIGENIC E COLI (ETEC): NOT DETECTED
Entamoeba histolytica: NOT DETECTED
Enteroaggregative E coli (EAEC): NOT DETECTED
Enteropathogenic E coli (EPEC): NOT DETECTED
Giardia lamblia: NOT DETECTED
NOROVIRUS GI/GII: NOT DETECTED
Plesimonas shigelloides: NOT DETECTED
Rotavirus A: NOT DETECTED
SAPOVIRUS (I, II, IV, AND V): NOT DETECTED
SHIGA LIKE TOXIN PRODUCING E COLI (STEC): NOT DETECTED
Salmonella species: DETECTED — AB
Shigella/Enteroinvasive E coli (EIEC): NOT DETECTED
Vibrio cholerae: NOT DETECTED
Vibrio species: NOT DETECTED
Yersinia enterocolitica: NOT DETECTED

## 2017-07-18 MED ORDER — VITAMIN B-12 100 MCG PO TABS
100.0000 ug | ORAL_TABLET | Freq: Every day | ORAL | Status: DC
  Administered 2017-07-18 – 2017-07-26 (×9): 100 ug via ORAL
  Filled 2017-07-18 (×9): qty 1

## 2017-07-18 MED ORDER — METRONIDAZOLE IN NACL 5-0.79 MG/ML-% IV SOLN
500.0000 mg | Freq: Three times a day (TID) | INTRAVENOUS | Status: DC
  Administered 2017-07-18 – 2017-07-19 (×3): 500 mg via INTRAVENOUS
  Filled 2017-07-18 (×3): qty 100

## 2017-07-18 MED ORDER — CIPROFLOXACIN IN D5W 400 MG/200ML IV SOLN
400.0000 mg | Freq: Every day | INTRAVENOUS | Status: DC
  Administered 2017-07-18: 400 mg via INTRAVENOUS
  Filled 2017-07-18: qty 200

## 2017-07-18 MED ORDER — METOPROLOL TARTRATE 12.5 MG HALF TABLET
12.5000 mg | ORAL_TABLET | Freq: Two times a day (BID) | ORAL | Status: DC
  Administered 2017-07-18 – 2017-07-26 (×15): 12.5 mg via ORAL
  Filled 2017-07-18 (×17): qty 1

## 2017-07-18 NOTE — Progress Notes (Signed)
Initial Nutrition Assessment  DOCUMENTATION CODES:   Not applicable  INTERVENTION:    Monitor for diet advancement/toleration  Ensure Enlive po BID, each supplement provides 350 kcal and 20 grams of protein once advanced  NUTRITION DIAGNOSIS:   Increased nutrient needs related to cancer and cancer related treatments, catabolic illness as evidenced by estimated needs.  GOAL:   Patient will meet greater than or equal to 90% of their needs  MONITOR:   PO intake, Supplement acceptance, Weight trends, Labs, Diet advancement  REASON FOR ASSESSMENT:   Malnutrition Screening Tool    ASSESSMENT:   Patient with PMH significant for transitional cell carcinoma of renal pelvis (last chemotherapy 07/04/17), liver metastases, HTN who presents with complaints of generalized weakness, diarrhea of 3-4 days duration, and poor oral intake. Admitted for dehydration and acute blood loss anemia.    Spoke with pt at bedside. States she typically eats a bowl of cereal for breakfast, yogurt with a banana at lunch, and a meat with vegetables for dinner. She began struggling to finish each meal around two week ago due to ongoing diarrhea. Pt denies any taste changes or swallowing difficulty. She drinks one Boost each day at home and would like to continue these. She is currently on clear liquids. RD to provide supplements once diet is advanced and diarrhea is under control.   Pt endorses a UBW of 130 lb. Records indicate she has maintained her current weight of 120-125 lb for the last year. Unable to complete Nutrition-Focused physical exam at this time as phad to use restroom.   Medications reviewed and include: Remeron, NS @ 75 ml/hr Labs reviewed.   NUTRITION - FOCUSED PHYSICAL EXAM:    Most Recent Value  Orbital Region  Unable to assess  Upper Arm Region  Unable to assess  Thoracic and Lumbar Region  Unable to assess  Buccal Region  Unable to assess  Temple Region  Unable to assess  Clavicle  Bone Region  Unable to assess  Clavicle and Acromion Bone Region  Unable to assess  Scapular Bone Region  Unable to assess  Dorsal Hand  Unable to assess  Patellar Region  Unable to assess  Anterior Thigh Region  Unable to assess  Posterior Calf Region  Unable to assess     Diet Order:  Diet full liquid Room service appropriate? Yes; Fluid consistency: Thin  EDUCATION NEEDS:   Education needs have been addressed  Skin:  Skin Assessment: Reviewed RN Assessment  Last BM:  07/18/17-liquid  Height:   Ht Readings from Last 1 Encounters:  07/18/17 5' 0.98" (1.549 m)    Weight:   Wt Readings from Last 1 Encounters:  07/18/17 123 lb (55.8 kg)    Ideal Body Weight:  47.7 kg  BMI:  Body mass index is 23.25 kg/m.  Estimated Nutritional Needs:   Kcal:  1600-1800 kcal  Protein:  80-90 g  Fluid:  >1.6 L/day    Mariana Single RD, LDN Clinical Nutrition Pager # [phone]

## 2017-07-18 NOTE — Progress Notes (Signed)
Pharmacy Antibiotic Note  AHONESTY WOODFIN is a 84 y.o. female admitted on 07/17/2017 with diarrhea and fever.  Pharmacy has been consulted for ciprofloxacin dosing.  Patient with fever this evening (also fever earlier in day) and orders to start ciprofloxacin and metronidazole. C. Difficile negative  Plan:  Ciprofloxacin 400mg  IV q24h per current renal function   If antibiotics need to continue, consider using ceftriaxone in place in place of ciprofloxacin if concern for intra-abdominal infection  Height: 5' 0.98" (154.9 cm) Weight: 123 lb (55.8 kg) IBW/kg (Calculated) : 47.76  Temp (24hrs), Avg:101.3 F (38.5 C), Min:99.6 F (37.6 C), Max:102.9 F (39.4 C)  Recent Labs  Lab 07/16/17 0915 07/16/17 1319 07/17/17 1341 07/18/17 0443  WBC 7.6 9.4 7.5 8.7  CREATININE 1.09*  --  1.25* 1.09*    Estimated Creatinine Clearance: 25.4 mL/min (A) (by C-G formula based on SCr of 1.09 mg/dL (H)).    Allergies  Allergen Reactions  . Nitrofurantoin Other (See Comments)    Trembling  . Indomethacin Swelling and Other (See Comments)    Tongue swelling  . Lisinopril Swelling and Other (See Comments)    Tongue swelling    Antimicrobials this admission: 4/18 ciprofloxacin  4/18 metronidazole >>  Dose adjustments this admission:  Microbiology results: 4/18 BCx:  4/18 C. Difficile: neg/neg 4/18 GI panel:  Thank you for allowing pharmacy to be a part of this patient's care.  Doreene Eland, PharmD, BCPS.   Pager: [phone] 07/18/2017 9:25 PM

## 2017-07-18 NOTE — Progress Notes (Signed)
PROGRESS NOTE  Tiffany [name] ZYS:[id number] DOB: 03-07-1925 DOA: 07/17/2017 PCP: [name], MD  HPI/Recap of past 24 hours:  Fever 102.8 this am  patient does not appear septic, she report diarrhea with blood this am, report intermittent lower abdominal pain, no n/v.  She wants to eat No cough, no sob, no edema, no skin issues Denies dysuria  Assessment/Plan: Active Problems:   Diarrhea in adult patient  Bloody Diarrhea, lower abdominal cramping, acute blood loss anemia, orthostatic hypotension, dizziness, fever -She does not have leukocytosis, does not appear septic. -C. difficile is negative, GI PCR panel in process, GI consulted, will plan to repeat flex sig at this moment, GI do not recommend steroid until rule out infection -Monitor hemoglobin today is 8.2, may need blood transfusion if continues to drop.  H/o large internal hemorrhoids on flex sig from 2/18  history of ulcerative proctitis (flex sig 2/18 by Dr. Wynetta Emery) and has been on Budesonide, prednisone rectal foam and mesalamine suppositories in the past without benefit per Dr. Durenda Age note in 2018.    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 S/p cancer immunotherapy with antiPD-L1 atezolizumab on 4/4 Immune medicated reaction/colitis could also be on differential  will discuss with oncology Dr Irene Limbo     Code Status: DNR  Family Communication: patient   Disposition Plan: not ready for discharge   Consultants:  [facility]  Oncology Dr Irene Limbo  Procedures:  none  Antibiotics:  none   Objective: BP (!) 134/55   Pulse 98   Temp 99.8 F (37.7 C) (Oral)   Resp 18   Ht 5' 0.98" (1.549 m)   Wt 55.8 kg (123 lb)   SpO2 92%   BMI 23.25 kg/m   Intake/Output Summary  (Last 24 hours) at 07/18/2017 1220 Last data filed at 07/18/2017 0802 Gross per 24 hour  Intake 1777.51 ml  Output -  Net 1777.51 ml   Filed Weights   07/18/17 0754  Weight: 55.8 kg (123 lb)    Exam: Patient is examined daily including today on 07/18/2017, exams remain the same as of yesterday except that has changed    General:  NAD  Cardiovascular: RRR  Respiratory: CTABL  Abdomen: mild tender lower abdomen, no guarding, no rebound, positive BS  Musculoskeletal: No Edema  Neuro: alert, oriented   Data Reviewed: Basic Metabolic Panel: Recent Labs  Lab 07/16/17 0915 07/17/17 1341 07/18/17 0443  NA 136 135 138  K 4.6 4.5 4.5  CL 103 102 106  CO2 23 23 23   GLUCOSE 97 107* 105*  BUN 21* 25* 18  CREATININE 1.09* 1.25* 1.09*  CALCIUM 9.4 9.2 8.8*  MG  --  1.8  --    Liver Function Tests: Recent Labs  Lab 07/16/17 0915 07/18/17 0443  AST 17 17  ALT 11* 10*  ALKPHOS 39 39  BILITOT 0.6 0.6  PROT 7.5 6.9  ALBUMIN 3.0* 2.6*   No results for input(s): LIPASE, AMYLASE in the last 168 hours. No results for input(s): AMMONIA in the last 168 hours. CBC: Recent Labs  Lab 07/16/17 0915 07/16/17 1319 07/17/17 1341 07/18/17 0443  WBC 7.6 9.4 7.5 8.7  NEUTROABS  --   --  4.2  --   HGB  8.8* 9.4* 8.4* 8.2*  HCT 29.2* 31.1* 28.0* 27.2*  MCV 84.4 85.0 83.8 84.7  PLT 339 363 347 329   Cardiac Enzymes:   No results for input(s): CKTOTAL, CKMB, CKMBINDEX, TROPONINI in the last 168 hours. BNP (last 3 results) No results for input(s): BNP in the last 8760 hours.  ProBNP (last 3 results) No results for input(s): PROBNP in the last 8760 hours.  CBG: No results for input(s): GLUCAP in the last 168 hours.  Recent Results (from the past 240 hour(s))  C difficile quick scan w PCR reflex     Status: None   Collection Time: 07/18/17  1:00 AM  Result Value Ref Range Status   C Diff antigen NEGATIVE NEGATIVE Final   C Diff toxin NEGATIVE NEGATIVE Final   C Diff  interpretation No C. difficile detected.  Final    Comment: Performed at [facility], [place] [address] [place], [place] [address]     Studies: No results found.  Scheduled Meds: . budesonide  3 mg Oral Daily  . estradiol  0.5 mg Oral Daily  . iron polysaccharides  150 mg Oral Daily  . metoprolol tartrate  12.5 mg Oral BID  . mirtazapine  15 mg Oral QHS    Continuous Infusions: . sodium chloride 75 mL/hr at 07/18/17 6720     Time spent: 62mins I have personally reviewed and interpreted on  07/18/2017 daily labs, tele strips, imagings as discussed above under date review session and assessment and plans.  I reviewed all nursing notes, pharmacy notes, consultant notes,  vitals, pertinent old records  I have discussed plan of care as described above with RN , patient on 07/18/2017   Florencia Reasons MD, PhD  [facility] Pager [phone]. If 7PM-7AM, please contact night-coverage at [email or web address], password [facility] 07/18/2017, 12:20 PM  LOS: 1 day

## 2017-07-18 NOTE — Progress Notes (Signed)
[facility]ology Progress Note  Tiffany [name] 84 y.o. 04/10/1924   Subjective: Large episode of incontinent loose stool this morning. Small amount of blood near end of BM with large loose stool today per nursing. Denies abdominal pain. Feels weak. Husband in room.  Objective: Vital signs: Vitals:   07/18/17 0518 07/18/17 0754  BP: (!) 134/55 (!) 134/55  Pulse: (!) 101 98  Resp: 18 18  Temp: (!) 102.8 F (39.3 C) 99.8 F (37.7 C)  SpO2: 92%     Physical Exam: Gen: lethargic, no acute distress, elderly, frail HEENT: anicteric sclera Abd: soft, nontender, nondistended   Lab Results: Recent Labs    07/17/17 1341 07/18/17 0443  NA 135 138  K 4.5 4.5  CL 102 106  CO2 23 23  GLUCOSE 107* 105*  BUN 25* 18  CREATININE 1.25* 1.09*  CALCIUM 9.2 8.8*  MG 1.8  --    Recent Labs    07/16/17 0915 07/18/17 0443  AST 17 17  ALT 11* 10*  ALKPHOS 39 39  BILITOT 0.6 0.6  PROT 7.5 6.9  ALBUMIN 3.0* 2.6*   Recent Labs    07/17/17 1341 07/18/17 0443  WBC 7.5 8.7  NEUTROABS 4.2  --   HGB 8.4* 8.2*  HCT 28.0* 27.2*  MCV 83.8 84.7  PLT 347 329      Assessment/Plan: Metastatic bladder cancer on chemo with history of ulcerative proctitis (not on treatment) with recent onset of bloody diarrhea. C. Diff negative, GI pathogen panel pending. Wants to eat. Will change to full liquids and if tolerates consider slowly advancing tomorrow. Would not do steroids for UC until stool studies negative and then consider Cortenemas. Continue supportive care. Dr. Watt Climes will f/u tomorrow.   [place] C. 07/18/2017, 11:10 AM  Questions please call [phone] ID: Randol Kern, female   DOB: 05/02/1924, 84 y.o.   MRN: [id number]

## 2017-07-19 LAB — BLOOD CULTURE ID PANEL (REFLEXED)
ACINETOBACTER BAUMANNII: NOT DETECTED
ACINETOBACTER BAUMANNII: NOT DETECTED
CANDIDA ALBICANS: NOT DETECTED
CANDIDA ALBICANS: NOT DETECTED
CANDIDA GLABRATA: NOT DETECTED
CANDIDA KRUSEI: NOT DETECTED
CANDIDA PARAPSILOSIS: NOT DETECTED
CARBAPENEM RESISTANCE: NOT DETECTED
Candida glabrata: NOT DETECTED
Candida krusei: NOT DETECTED
Candida parapsilosis: NOT DETECTED
Candida tropicalis: NOT DETECTED
Candida tropicalis: NOT DETECTED
Carbapenem resistance: NOT DETECTED
ENTEROBACTER CLOACAE COMPLEX: NOT DETECTED
ENTEROBACTERIACEAE SPECIES: DETECTED — AB
ENTEROBACTERIACEAE SPECIES: DETECTED — AB
ENTEROCOCCUS SPECIES: NOT DETECTED
ESCHERICHIA COLI: NOT DETECTED
Enterobacter cloacae complex: NOT DETECTED
Enterococcus species: NOT DETECTED
Escherichia coli: NOT DETECTED
Haemophilus influenzae: NOT DETECTED
Haemophilus influenzae: NOT DETECTED
KLEBSIELLA OXYTOCA: NOT DETECTED
KLEBSIELLA PNEUMONIAE: NOT DETECTED
Klebsiella oxytoca: NOT DETECTED
Klebsiella pneumoniae: NOT DETECTED
LISTERIA MONOCYTOGENES: NOT DETECTED
Listeria monocytogenes: NOT DETECTED
NEISSERIA MENINGITIDIS: NOT DETECTED
Neisseria meningitidis: NOT DETECTED
PSEUDOMONAS AERUGINOSA: NOT DETECTED
PSEUDOMONAS AERUGINOSA: NOT DETECTED
Proteus species: NOT DETECTED
Proteus species: NOT DETECTED
STAPHYLOCOCCUS AUREUS BCID: NOT DETECTED
STAPHYLOCOCCUS AUREUS BCID: NOT DETECTED
STREPTOCOCCUS AGALACTIAE: NOT DETECTED
STREPTOCOCCUS PYOGENES: NOT DETECTED
STREPTOCOCCUS SPECIES: NOT DETECTED
Serratia marcescens: NOT DETECTED
Serratia marcescens: NOT DETECTED
Staphylococcus species: NOT DETECTED
Staphylococcus species: NOT DETECTED
Streptococcus agalactiae: NOT DETECTED
Streptococcus pneumoniae: NOT DETECTED
Streptococcus pneumoniae: NOT DETECTED
Streptococcus pyogenes: NOT DETECTED
Streptococcus species: NOT DETECTED

## 2017-07-19 LAB — BASIC METABOLIC PANEL
Anion gap: 10 (ref 5–15)
BUN: 18 mg/dL (ref 6–20)
CALCIUM: 8.3 mg/dL — AB (ref 8.9–10.3)
CHLORIDE: 106 mmol/L (ref 101–111)
CO2: 18 mmol/L — ABNORMAL LOW (ref 22–32)
CREATININE: 1.2 mg/dL — AB (ref 0.44–1.00)
GFR, EST AFRICAN AMERICAN: 44 mL/min — AB (ref >60)
GFR, EST NON AFRICAN AMERICAN: 38 mL/min — AB (ref >60)
Glucose, Bld: 125 mg/dL — ABNORMAL HIGH (ref 65–99)
Potassium: 4.8 mmol/L (ref 3.5–5.1)
SODIUM: 134 mmol/L — AB (ref 135–145)

## 2017-07-19 LAB — CBC WITH DIFFERENTIAL/PLATELET
BASOS ABS: 0 10*3/uL (ref 0.0–0.1)
BASOS PCT: 0 %
EOS ABS: 0 10*3/uL (ref 0.0–0.7)
Eosinophils Relative: 0 %
HCT: 27.3 % — ABNORMAL LOW (ref 36.0–46.0)
HEMOGLOBIN: 8.4 g/dL — AB (ref 12.0–15.0)
LYMPHS PCT: 10 %
Lymphs Abs: 1.5 10*3/uL (ref 0.7–4.0)
MCH: 25.9 pg — AB (ref 26.0–34.0)
MCHC: 30.8 g/dL (ref 30.0–36.0)
MCV: 84.3 fL (ref 78.0–100.0)
MONO ABS: 3.7 10*3/uL — AB (ref 0.1–1.0)
Monocytes Relative: 24 %
NEUTROS ABS: 10.2 10*3/uL — AB (ref 1.7–7.7)
NEUTROS PCT: 66 %
PLATELETS: 315 10*3/uL (ref 150–400)
RBC: 3.24 MIL/uL — ABNORMAL LOW (ref 3.87–5.11)
RDW: 17.4 % — ABNORMAL HIGH (ref 11.5–15.5)
WBC: 15.4 10*3/uL — ABNORMAL HIGH (ref 4.0–10.5)

## 2017-07-19 LAB — FOLATE: Folate: 44.4 ng/mL (ref >5.9)

## 2017-07-19 LAB — MAGNESIUM: MAGNESIUM: 1.4 mg/dL — AB (ref 1.7–2.4)

## 2017-07-19 LAB — VITAMIN B12: VITAMIN B 12: 791 pg/mL (ref 180–914)

## 2017-07-19 LAB — PROTIME-INR
INR: 1.33
Prothrombin Time: 16.4 seconds — ABNORMAL HIGH (ref 11.4–15.2)

## 2017-07-19 LAB — LACTIC ACID, PLASMA: Lactic Acid, Venous: 2.1 mmol/L (ref 0.5–1.9)

## 2017-07-19 LAB — C-REACTIVE PROTEIN: CRP: 18.8 mg/dL — ABNORMAL HIGH (ref <1.0)

## 2017-07-19 LAB — SEDIMENTATION RATE: Sed Rate: 70 mm/hr — ABNORMAL HIGH (ref 0–22)

## 2017-07-19 MED ORDER — SODIUM CHLORIDE 0.9 % IV SOLN
2.0000 g | INTRAVENOUS | Status: DC
  Administered 2017-07-19 – 2017-07-25 (×7): 2 g via INTRAVENOUS
  Filled 2017-07-19 (×6): qty 2
  Filled 2017-07-19: qty 20

## 2017-07-19 NOTE — Progress Notes (Signed)
PHARMACY - PHYSICIAN COMMUNICATION CRITICAL VALUE ALERT - BLOOD CULTURE IDENTIFICATION (BCID)  Tiffany [name] is an 84 y.o. female who presented to [facility] on 07/17/2017 with a chief complaint of diarrhea and fever.   Assessment:  GI? (include suspected source if known)  Name of physician (or Provider) Contacted: Georges Mouse, MD  Current antibiotics: Cipro 400 mg IV q24h  Changes to prescribed antibiotics recommended:  No change for now  Results for orders placed or performed during the hospital encounter of 07/17/17  Blood Culture ID Panel (Reflexed) (Collected: 07/18/2017  9:33 AM)  Result Value Ref Range   Enterococcus species NOT DETECTED NOT DETECTED   Listeria monocytogenes NOT DETECTED NOT DETECTED   Staphylococcus species NOT DETECTED NOT DETECTED   Staphylococcus aureus NOT DETECTED NOT DETECTED   Streptococcus species NOT DETECTED NOT DETECTED   Streptococcus agalactiae NOT DETECTED NOT DETECTED   Streptococcus pneumoniae NOT DETECTED NOT DETECTED   Streptococcus pyogenes NOT DETECTED NOT DETECTED   Acinetobacter baumannii NOT DETECTED NOT DETECTED   Enterobacteriaceae species DETECTED (A) NOT DETECTED   Enterobacter cloacae complex NOT DETECTED NOT DETECTED   Escherichia coli NOT DETECTED NOT DETECTED   Klebsiella oxytoca NOT DETECTED NOT DETECTED   Klebsiella pneumoniae NOT DETECTED NOT DETECTED   Proteus species NOT DETECTED NOT DETECTED   Serratia marcescens NOT DETECTED NOT DETECTED   Carbapenem resistance NOT DETECTED NOT DETECTED   Haemophilus influenzae NOT DETECTED NOT DETECTED   Neisseria meningitidis NOT DETECTED NOT DETECTED   Pseudomonas aeruginosa NOT DETECTED NOT DETECTED   Candida albicans NOT DETECTED NOT DETECTED   Candida glabrata NOT DETECTED NOT DETECTED   Candida krusei NOT DETECTED NOT DETECTED   Candida parapsilosis NOT DETECTED NOT DETECTED   Candida tropicalis NOT DETECTED NOT DETECTED    Dorrene German 07/19/2017  2:32 AM

## 2017-07-19 NOTE — Progress Notes (Signed)
PROGRESS NOTE  Tiffany [name] VZD:[id number] DOB: 13-Dec-1924 DOA: 07/17/2017 PCP: [name], MD  HPI/Recap of past 24 hours:  Fever 102.9 yesterday evening She is started on abx last night for salmonella  Fever seems coming down, she  patient does not appear septic,  she denies pain, no n/v, last diarrhea was early this am  Family at bedside  Assessment/Plan: Active Problems:   Diarrhea in adult patient  Salmonella enteritis and bacteremia: -she presented with Bloody Diarrhea, lower abdominal cramping, acute blood loss anemia, orthostatic hypotension, dizziness, fever -She does not have leukocytosis, does not appear septic. -C. difficile is negative, GI PCR panel + salmonella, blood culture + g-rods, bcid + enterobacteriaceae species -she is started on cipro/flagyl over night (4/18-4/19), I discussed case with infectious disease Dr Baxter Flattery who recommended change abx to rocephin 2g daily, for total of 14days treatment. Will need to repeat blood culture to ensure clearance. If repeat blood culture no growth, no need of echocardiogram. Currently patient dose not have heart murmur, no back pain, no headache or neck stiffness. -will need picc line placement once repeat blood culture negative -continue hydration for another 24hrs  Acute blood loss anemia on anemia of chronic disease -baseline hgb around 9 -hgb on admission 8.4 then 8.2 then 8.4,  -Monitor hemoglobin, no indication for prbc transfusion  H/o large internal hemorrhoids on flex sig from 2/18  history of ulcerative proctitis (flex sig 2/18 by Dr. Wynetta Emery) and has been on Budesonide, prednisone rectal foam and mesalamine suppositories in the past without benefit per Dr. Durenda Age note in 2018.  [facility] consulted, will not plan to repeat flex sig at this moment, GI do not recommend steroid in the setting of infection.  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 Left message for Dr Grier Mitts RN.     Code Status: DNR  Family Communication: patient and family  Disposition Plan: not ready for discharge   Consultants:  [facility]   Procedures:  none  Antibiotics:  Cipro/flagyl on 4/18-4/19 night  Rocephin 2g daily since 4/19   Objective: BP (!) 92/51 (BP Location: Right Arm)   Pulse 81   Temp 98.6 F (37 C) (Oral)   Resp 16   Ht 5' 0.98" (1.549 m)   Wt 55.8 kg (123 lb)   SpO2 94%   BMI 23.25 kg/m   Intake/Output Summary (Last 24 hours) at 07/19/2017 1439 Last data filed at 07/19/2017 0403 Gross per 24 hour  Intake 1358.75 ml  Output -  Net 1358.75 ml   Filed Weights   07/18/17 0754  Weight: 55.8 kg (123 lb)    Exam: Patient is examined daily including today on 07/19/2017, exams remain the same as of yesterday except that has changed    General:  NAD  Cardiovascular: RRR  Respiratory: CTABL  Abdomen: mild tender lower abdomen is improving, no guarding, no rebound, positive BS  Musculoskeletal: No Edema  Neuro: alert, oriented   Data Reviewed: Basic Metabolic Panel: Recent  Labs  Lab 07/16/17 0915 07/17/17 1341 07/18/17 0443 07/19/17 0415  NA 136 135 138 134*  K 4.6 4.5 4.5 4.8  CL 103 102 106 106  CO2 23 23 23  18*  GLUCOSE 97 107* 105* 125*  BUN 21* 25* 18 18  CREATININE 1.09* 1.25* 1.09* 1.20*  CALCIUM 9.4 9.2 8.8* 8.3*  MG  --  1.8  --  1.4*   Liver Function Tests: Recent Labs  Lab 07/16/17 0915 07/18/17 0443  AST 17 17  ALT 11* 10*  ALKPHOS 39 39  BILITOT  0.6 0.6  PROT 7.5 6.9  ALBUMIN 3.0* 2.6*   No results for input(s): LIPASE, AMYLASE in the last 168 hours. No results for input(s): AMMONIA in the last 168 hours. CBC: Recent Labs  Lab 07/16/17 0915 07/16/17 1319 07/17/17 1341 07/18/17 0443 07/19/17 0415  WBC 7.6 9.4 7.5 8.7 15.4*  NEUTROABS  --   --  4.2  --  10.2*  HGB 8.8* 9.4* 8.4* 8.2* 8.4*  HCT 29.2* 31.1* 28.0* 27.2* 27.3*  MCV 84.4 85.0 83.8 84.7 84.3  PLT 339 363 347 329 315   Cardiac Enzymes:   No results for input(s): CKTOTAL, CKMB, CKMBINDEX, TROPONINI in the last 168 hours. BNP (last 3 results) No results for input(s): BNP in the last 8760 hours.  ProBNP (last 3 results) No results for input(s): PROBNP in the last 8760 hours.  CBG: No results for input(s): GLUCAP in the last 168 hours.  Recent Results (from the past 240 hour(s))  C difficile quick scan w PCR reflex     Status: None   Collection Time: 07/18/17  1:00 AM  Result Value Ref Range Status   C Diff antigen NEGATIVE NEGATIVE Final   C Diff toxin NEGATIVE NEGATIVE Final   C Diff interpretation No C. difficile detected.  Final    Comment: Performed at [facility], [place] [address], [place], [place] [address]  Gastrointestinal Panel by PCR , Stool     Status: Abnormal   Collection Time: 07/18/17  1:00 AM  Result Value Ref Range Status   Campylobacter species NOT DETECTED NOT DETECTED Final   Plesimonas shigelloides NOT DETECTED NOT DETECTED Final   Salmonella species DETECTED (A) NOT DETECTED Final    Comment: RESULT CALLED TO, READ BACK BY AND VERIFIED WITH: MACKENZIE HASTINGS @ 2114 ON 07/18/2017 BY CAF    Yersinia enterocolitica NOT DETECTED NOT DETECTED Final   Vibrio species NOT DETECTED NOT DETECTED Final   Vibrio cholerae NOT DETECTED NOT DETECTED Final   Enteroaggregative E coli (EAEC) NOT DETECTED NOT DETECTED Final   Enteropathogenic E coli (EPEC) NOT DETECTED NOT DETECTED Final   Enterotoxigenic E coli (ETEC) NOT  DETECTED NOT DETECTED Final   Shiga like toxin producing E coli (STEC) NOT DETECTED NOT DETECTED Final   Shigella/Enteroinvasive E coli (EIEC) NOT DETECTED NOT DETECTED Final   Cryptosporidium NOT DETECTED NOT DETECTED Final   Cyclospora cayetanensis NOT DETECTED NOT DETECTED Final   Entamoeba histolytica NOT DETECTED NOT DETECTED Final   Giardia lamblia NOT DETECTED NOT DETECTED Final   Adenovirus F40/41 NOT DETECTED NOT DETECTED Final   Astrovirus NOT DETECTED NOT DETECTED Final   Norovirus GI/GII NOT DETECTED NOT DETECTED Final   Rotavirus A NOT DETECTED NOT DETECTED Final   Sapovirus (I, II, IV, and V) NOT DETECTED NOT DETECTED Final    Comment: Performed at [facility], [place] [place], [place] [address]  Culture, blood (routine x 2)  Status: None (Preliminary result)   Collection Time: 07/18/17  9:33 AM  Result Value Ref Range Status   Specimen Description BLOOD RIGHT HAND  Final   Special Requests   Final    BOTTLES DRAWN AEROBIC AND ANAEROBIC Blood Culture adequate volume Performed at [place] [address] [place], [place] [address]    Culture  Setup Time   Final    GRAM NEGATIVE RODS IN BOTH AEROBIC AND ANAEROBIC BOTTLES Organism ID to follow CRITICAL RESULT CALLED TO, READ BACK BY AND VERIFIED WITH: B GREEN PHARMD 07/19/17 0223 JDW    Culture GRAM NEGATIVE RODS  Final   Report Status PENDING  Incomplete  Blood Culture ID Panel (Reflexed)     Status: Abnormal   Collection Time: 07/18/17  9:33 AM  Result Value Ref Range Status   Enterococcus species NOT DETECTED NOT DETECTED Final   Listeria monocytogenes NOT DETECTED NOT DETECTED Final   Staphylococcus species NOT DETECTED NOT DETECTED Final   Staphylococcus aureus NOT DETECTED NOT DETECTED Final   Streptococcus species NOT DETECTED NOT DETECTED Final   Streptococcus agalactiae NOT DETECTED NOT DETECTED Final   Streptococcus pneumoniae NOT DETECTED NOT DETECTED Final    Streptococcus pyogenes NOT DETECTED NOT DETECTED Final   Acinetobacter baumannii NOT DETECTED NOT DETECTED Final   Enterobacteriaceae species DETECTED (A) NOT DETECTED Final    Comment: Enterobacteriaceae represent a large family of gram negative bacteria, not a single organism. Refer to culture for further identification. CRITICAL RESULT CALLED TO, READ BACK BY AND VERIFIED WITH: B GREEN PHARMD 07/19/17 0223 JDW    Enterobacter cloacae complex NOT DETECTED NOT DETECTED Final   Escherichia coli NOT DETECTED NOT DETECTED Final   Klebsiella oxytoca NOT DETECTED NOT DETECTED Final   Klebsiella pneumoniae NOT DETECTED NOT DETECTED Final   Proteus species NOT DETECTED NOT DETECTED Final   Serratia marcescens NOT DETECTED NOT DETECTED Final   Carbapenem resistance NOT DETECTED NOT DETECTED Final   Haemophilus influenzae NOT DETECTED NOT DETECTED Final   Neisseria meningitidis NOT DETECTED NOT DETECTED Final   Pseudomonas aeruginosa NOT DETECTED NOT DETECTED Final   Candida albicans NOT DETECTED NOT DETECTED Final   Candida glabrata NOT DETECTED NOT DETECTED Final   Candida krusei NOT DETECTED NOT DETECTED Final   Candida parapsilosis NOT DETECTED NOT DETECTED Final   Candida tropicalis NOT DETECTED NOT DETECTED Final  Culture, blood (routine x 2)     Status: None (Preliminary result)   Collection Time: 07/18/17 10:38 PM  Result Value Ref Range Status   Specimen Description   Final    BLOOD LEFT HAND Performed at [facility], [place] [address], [place], [place] [address]    Special Requests   Final    BOTTLES DRAWN AEROBIC AND ANAEROBIC Blood Culture adequate volume Performed at [place] [address] [place], [place] [address]    Culture  Setup Time   Final    GRAM NEGATIVE RODS ANAEROBIC BOTTLE ONLY IDENTIFICATION TO FOLLOW Performed at [facility], [place] [address], [place],  [address]    Culture GRAM NEGATIVE RODS  Final    Report Status PENDING  Incomplete  Culture, blood (routine x 2)     Status: None (Preliminary result)   Collection Time: 07/18/17 10:38 PM  Result Value Ref Range Status   Specimen Description   Final    BLOOD RIGHT HAND Performed at [place] [address] [place], [place]  [address]    Special Requests   Final    BOTTLES DRAWN AEROBIC AND ANAEROBIC Blood Culture adequate volume Performed at [place] [address], [place], [place] [address]    Culture  Setup Time   Final    GRAM NEGATIVE RODS ANAEROBIC BOTTLE ONLY Organism ID to follow Performed at [facility], [place] [address] [place],  [address]    Culture GRAM NEGATIVE RODS  Final   Report Status PENDING  Incomplete     Studies: Dg Chest 2 View  Result Date: 07/18/2017 CLINICAL DATA:  Fever EXAM: CHEST - 2 VIEW COMPARISON:  Chest x-ray dated 05/01/2015. FINDINGS: Heart size and mediastinal contours are within normal limits. Lungs are clear. No pleural effusion or pneumothorax seen. No acute or suspicious osseous finding. RIGHT chest wall Port-A-Cath is stable in position with tip at the level of the mid/lower SVC. IMPRESSION: No active cardiopulmonary disease. No evidence of pneumonia or pulmonary edema. Electronically Signed   By: Franki Cabot M.D.   On: 07/18/2017 21:45    Scheduled Meds: . budesonide  3 mg Oral Daily  . estradiol  0.5 mg Oral Daily  . iron polysaccharides  150 mg Oral Daily  . metoprolol tartrate  12.5 mg Oral BID  . mirtazapine  15 mg Oral QHS  . vitamin B-12  100 mcg Oral Daily    Continuous Infusions: . sodium chloride Stopped (07/18/17 2121)  . cefTRIAXone (ROCEPHIN)  IV       Time spent: 48mins, case discussed with gi Dr Watt Climes and ID Dr snider. Message left for oncology. I have personally reviewed and interpreted on  07/19/2017 daily labs, tele strips, imagings as discussed above under date review session and assessment and  plans.  I reviewed all nursing notes, pharmacy notes, consultant notes,  vitals, pertinent old records  I have discussed plan of care as described above with RN , patient and family on 07/19/2017   Florencia Reasons MD, PhD  [facility] Pager [phone]. If 7PM-7AM, please contact night-coverage at [email or web address], password [facility] 07/19/2017, 2:39 PM  LOS: 2 days

## 2017-07-19 NOTE — Progress Notes (Signed)
PHARMACY - PHYSICIAN COMMUNICATION CRITICAL VALUE ALERT - BLOOD CULTURE IDENTIFICATION (BCID)  Tiffany [name] is an 84 y.o. female who presented to [facility] on 07/17/2017 with a chief complaint of dairrhea  Assessment: 4/18 2200 BCx = GNR (consistent with previous cultures drawn only 12h prior)  Name of physician (or Provider) Contacted: Xu  Current antibiotics: ceftriaxone  Changes to prescribed antibiotics recommended:  Patient is on recommended antibiotics - No changes needed  Results for orders placed or performed during the hospital encounter of 07/17/17  Blood Culture ID Panel (Reflexed) (Collected: 07/18/2017 10:38 PM)  Result Value Ref Range   Enterococcus species NOT DETECTED NOT DETECTED   Listeria monocytogenes NOT DETECTED NOT DETECTED   Staphylococcus species NOT DETECTED NOT DETECTED   Staphylococcus aureus NOT DETECTED NOT DETECTED   Streptococcus species NOT DETECTED NOT DETECTED   Streptococcus agalactiae NOT DETECTED NOT DETECTED   Streptococcus pneumoniae NOT DETECTED NOT DETECTED   Streptococcus pyogenes NOT DETECTED NOT DETECTED   Acinetobacter baumannii NOT DETECTED NOT DETECTED   Enterobacteriaceae species DETECTED (A) NOT DETECTED   Enterobacter cloacae complex NOT DETECTED NOT DETECTED   Escherichia coli NOT DETECTED NOT DETECTED   Klebsiella oxytoca NOT DETECTED NOT DETECTED   Klebsiella pneumoniae NOT DETECTED NOT DETECTED   Proteus species NOT DETECTED NOT DETECTED   Serratia marcescens NOT DETECTED NOT DETECTED   Carbapenem resistance NOT DETECTED NOT DETECTED   Haemophilus influenzae NOT DETECTED NOT DETECTED   Neisseria meningitidis NOT DETECTED NOT DETECTED   Pseudomonas aeruginosa NOT DETECTED NOT DETECTED   Candida albicans NOT DETECTED NOT DETECTED   Candida glabrata NOT DETECTED NOT DETECTED   Candida krusei NOT DETECTED NOT DETECTED   Candida parapsilosis NOT DETECTED NOT DETECTED   Candida tropicalis NOT DETECTED NOT DETECTED    Doreene Eland, PharmD, BCPS.   07/19/2017 4:28 PM

## 2017-07-19 NOTE — Progress Notes (Signed)
CRITICAL VALUE ALERT  Critical Value:  Lactic acid- 2.1  Date & Time Notied:  07/19/2017 @0457   Provider Notified: Maudie Mercury @ [phone]  Orders Received/Actions taken: no orders given

## 2017-07-19 NOTE — Progress Notes (Signed)
The patient started to display chills and some beading of sweat on her forehead ~2100. She stated she was freezing and needed more blankets. Took temp orally and it was 102.9. Notified physician and gave PRN tylenol. Temp was 98.4 at 2300. This AM she started to have chills again but her temp is 98.8. Will continue to monitor and reassess later this AM.

## 2017-07-19 NOTE — Progress Notes (Signed)
Tiffany [name] 10:31 AM  Subjective: Patient seen and examined and discussed with my partner Dr. Michail Sermon as well as the hospital team and she has grown out Salmonella and although she has not eaten out ata restaurant recently she did Cook a chicken but her husband has not been sick and she has no new complaints Objective: Vital signs stable afebrile no acute distress answering all questions appropriately abdomen is soft nontender white count increased other labs stable  Assessment: salmonella  Plan: Please call me this weekend if I could be of any further assistance with this hospital stay otherwise consider consulting infectious disease if she does not improve on appropriate therapy  [facility] [place] E  Pager [phone] After 5PM or if no answer call [phone]

## 2017-07-20 LAB — LACTIC ACID, PLASMA: Lactic Acid, Venous: 0.9 mmol/L (ref 0.5–1.9)

## 2017-07-20 LAB — BASIC METABOLIC PANEL
ANION GAP: 9 (ref 5–15)
BUN: 26 mg/dL — AB (ref 6–20)
CO2: 19 mmol/L — AB (ref 22–32)
Calcium: 8.2 mg/dL — ABNORMAL LOW (ref 8.9–10.3)
Chloride: 111 mmol/L (ref 101–111)
Creatinine, Ser: 1.39 mg/dL — ABNORMAL HIGH (ref 0.44–1.00)
GFR calc Af Amer: 37 mL/min — ABNORMAL LOW (ref >60)
GFR calc non Af Amer: 32 mL/min — ABNORMAL LOW (ref >60)
GLUCOSE: 107 mg/dL — AB (ref 65–99)
POTASSIUM: 3.9 mmol/L (ref 3.5–5.1)
Sodium: 139 mmol/L (ref 135–145)

## 2017-07-20 LAB — CBC
HCT: 24 % — ABNORMAL LOW (ref 36.0–46.0)
Hemoglobin: 7.4 g/dL — ABNORMAL LOW (ref 12.0–15.0)
MCH: 25.9 pg — ABNORMAL LOW (ref 26.0–34.0)
MCHC: 30.8 g/dL (ref 30.0–36.0)
MCV: 83.9 fL (ref 78.0–100.0)
Platelets: 269 10*3/uL (ref 150–400)
RBC: 2.86 MIL/uL — ABNORMAL LOW (ref 3.87–5.11)
RDW: 17.7 % — ABNORMAL HIGH (ref 11.5–15.5)
WBC: 13.1 10*3/uL — ABNORMAL HIGH (ref 4.0–10.5)

## 2017-07-20 LAB — MAGNESIUM: Magnesium: 1.6 mg/dL — ABNORMAL LOW (ref 1.7–2.4)

## 2017-07-20 MED ORDER — SODIUM BICARBONATE 650 MG PO TABS
650.0000 mg | ORAL_TABLET | Freq: Two times a day (BID) | ORAL | Status: DC
  Administered 2017-07-20 – 2017-07-26 (×13): 650 mg via ORAL
  Filled 2017-07-20 (×13): qty 1

## 2017-07-20 MED ORDER — NYSTATIN 100000 UNIT/ML MT SUSP
5.0000 mL | Freq: Four times a day (QID) | OROMUCOSAL | Status: DC
  Administered 2017-07-20 – 2017-07-26 (×22): 500000 [IU] via ORAL
  Filled 2017-07-20 (×20): qty 5

## 2017-07-20 MED ORDER — LACTATED RINGERS IV BOLUS
1000.0000 mL | Freq: Once | INTRAVENOUS | Status: AC
  Administered 2017-07-20: 1000 mL via INTRAVENOUS

## 2017-07-20 MED ORDER — MAGNESIUM SULFATE 2 GM/50ML IV SOLN
2.0000 g | Freq: Once | INTRAVENOUS | Status: AC
  Administered 2017-07-20: 2 g via INTRAVENOUS
  Filled 2017-07-20: qty 50

## 2017-07-20 NOTE — Progress Notes (Signed)
PROGRESS NOTE  Tiffany [name] GDJ:[id number] DOB: 12-03-1924 DOA: 07/17/2017 PCP: [name], MD  HPI/Recap of past 24 hours:  Afebrile last night, she  Has some chills this am, ab pain has resolved, continue to have diarrhea She denies n/v.   Family at bedside  Assessment/Plan: Active Problems:   Diarrhea in adult patient  Sepsis from Salmonella enteritis and bacteremia in an immunosuppressed individual : -she presented with Bloody Diarrhea, lower abdominal cramping, acute blood loss anemia, orthostatic hypotension, dizziness, fever,  lactic acidosis, wbc elevated at 15.4 on second day of admission.  -C. difficile is negative, GI PCR panel + salmonella, blood culture + g-rods, bcid + enterobacteriaceae species -she is started on cipro/flagyl over night (4/18-4/19), I discussed case with infectious disease Dr Baxter Flattery who recommended change abx to rocephin 2g daily, for total of 14days treatment. -repeat blood culture on 4/20 to ensure clearance. If repeat blood culture no growth, no need of echocardiogram. Currently patient dose not have heart murmur, no back pain, no headache or neck stiffness. -will need picc line placement once repeat blood culture negative -continue hydration , patient continue to have diarrhea  Acute blood loss anemia on anemia of chronic disease -baseline hgb around 9 -hgb on admission 8.4 then 8.2 then 8.4 then 7.4,  -Monitor hemoglobin, may need prbc transfusion if continue to trend down  H/o large internal hemorrhoids on flex sig from 2/18  history of ulcerative proctitis (flex sig 2/18 by Dr. Wynetta Emery) and has been on Budesonide, prednisone rectal foam and mesalamine suppositories in the past without benefit per Dr. Durenda Age note in 2018.  [facility] consulted, will not plan to repeat flex sig at this moment, GI do not recommend steroid in the setting of infection.  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 Left message for Dr Grier Mitts RN.     Code Status: DNR  Family Communication: patient and family  Disposition Plan: not ready for discharge   Consultants:  [facility]  Oncology  ID   Procedures:  none  Antibiotics:  Cipro/flagyl on 4/18-4/19 night  Rocephin 2g daily since 4/19   Objective: BP (!) 95/47 (BP Location: Left Arm)   Pulse 66   Temp 97.8 F (36.6 C) (Axillary)   Resp 16   Ht 5' 0.98" (1.549 m)   Wt 56.4 kg (124 lb 5.4 oz)   SpO2 97%   BMI 23.51 kg/m   Intake/Output Summary (Last 24 hours) at 07/20/2017 0859 Last data filed at 07/19/2017 1859 Gross per 24 hour  Intake 248.75 ml  Output 350 ml  Net -101.25 ml   Filed Weights   07/18/17 0754 07/20/17 0300  Weight: 55.8 kg (123 lb) 56.4 kg (124 lb 5.4 oz)    Exam: Patient is examined daily including today on 07/20/2017, exams remain the same as of yesterday except that has changed    General:  Frail elderly,  NAD, + oral thrush  Cardiovascular: RRR  Respiratory: CTABL  Abdomen: soft/NT/ND,  positive  BS  Musculoskeletal: No Edema  Neuro: alert, oriented   Data Reviewed: Basic Metabolic Panel: Recent Labs  Lab 07/16/17 0915 07/17/17 1341 07/18/17 0443 07/19/17 0415 07/20/17 0444  NA 136 135 138 134* 139  K 4.6 4.5 4.5 4.8 3.9  CL 103 102 106 106 111  CO2 23 23 23  18* 19*  GLUCOSE 97 107* 105* 125* 107*  BUN 21* 25* 18 18 26*  CREATININE 1.09* 1.25* 1.09* 1.20* 1.39*  CALCIUM 9.4 9.2 8.8* 8.3* 8.2*  MG  --  1.8  --   1.4* 1.6*   Liver Function Tests: Recent Labs  Lab 07/16/17 0915 07/18/17 0443  AST 17 17  ALT 11* 10*  ALKPHOS 39 39  BILITOT 0.6 0.6  PROT 7.5 6.9  ALBUMIN 3.0* 2.6*   No results for input(s): LIPASE, AMYLASE in the last 168 hours. No results for input(s): AMMONIA in the last 168 hours. CBC: Recent Labs  Lab 07/16/17 1319 07/17/17 1341 07/18/17 0443 07/19/17 0415 07/20/17 0444  WBC 9.4 7.5 8.7 15.4* 13.1*  NEUTROABS  --  4.2  --  10.2*  --   HGB 9.4* 8.4* 8.2* 8.4* 7.4*  HCT 31.1* 28.0* 27.2* 27.3* 24.0*  MCV 85.0 83.8 84.7 84.3 83.9  PLT 363 347 329 315 269   Cardiac Enzymes:   No results for input(s): CKTOTAL, CKMB, CKMBINDEX, TROPONINI in the last 168 hours. BNP (last 3 results) No results for input(s): BNP in the last 8760 hours.  ProBNP (last 3 results) No results for input(s): PROBNP in the last 8760 hours.  CBG: No results for input(s): GLUCAP in the last 168 hours.  Recent Results (from the past 240 hour(s))  C difficile quick scan w PCR reflex     Status: None   Collection Time: 07/18/17  1:00 AM  Result Value Ref Range Status   C Diff antigen NEGATIVE NEGATIVE Final   C Diff toxin NEGATIVE NEGATIVE Final   C Diff interpretation No C. difficile detected.  Final    Comment: Performed at [facility], [place] [address], [place], [place] [address]  Gastrointestinal Panel by PCR , Stool     Status: Abnormal   Collection Time: 07/18/17  1:00 AM  Result Value Ref Range Status   Campylobacter species NOT DETECTED NOT DETECTED Final   Plesimonas shigelloides NOT DETECTED NOT DETECTED Final   Salmonella species DETECTED (A) NOT DETECTED Final    Comment: RESULT CALLED TO, READ BACK BY AND VERIFIED WITH: MACKENZIE HASTINGS @ 2114 ON 07/18/2017 BY [facility]    Yersinia enterocolitica NOT DETECTED NOT DETECTED Final   Vibrio species NOT DETECTED NOT DETECTED Final   Vibrio cholerae NOT DETECTED NOT DETECTED Final   Enteroaggregative E coli (EAEC)  NOT DETECTED NOT DETECTED Final   Enteropathogenic E coli (EPEC) NOT DETECTED NOT DETECTED Final   Enterotoxigenic E coli (ETEC) NOT DETECTED NOT DETECTED Final   Shiga like toxin producing E coli (STEC) NOT DETECTED NOT DETECTED Final   Shigella/Enteroinvasive E coli (EIEC) NOT DETECTED NOT DETECTED Final   Cryptosporidium NOT DETECTED NOT DETECTED Final   Cyclospora cayetanensis NOT DETECTED NOT DETECTED Final   Entamoeba histolytica NOT DETECTED NOT DETECTED Final   Giardia lamblia NOT DETECTED NOT DETECTED Final   Adenovirus F40/41 NOT DETECTED NOT DETECTED Final   Astrovirus NOT DETECTED NOT DETECTED Final   Norovirus GI/GII NOT DETECTED NOT DETECTED Final   Rotavirus A NOT DETECTED NOT DETECTED Final   Sapovirus (I, II, IV, and  V) NOT DETECTED NOT DETECTED Final    Comment: Performed at [facility], [place] [place], [place] [address]  Culture, blood (routine x 2)     Status: None (Preliminary result)   Collection Time: 07/18/17  9:33 AM  Result Value Ref Range Status   Specimen Description BLOOD RIGHT HAND  Final   Special Requests   Final    BOTTLES DRAWN AEROBIC AND ANAEROBIC Blood Culture adequate volume Performed at [place] [address], [place], [place] [address]    Culture  Setup Time   Final    GRAM NEGATIVE RODS IN BOTH AEROBIC AND ANAEROBIC BOTTLES Organism ID to follow CRITICAL RESULT CALLED TO, READ BACK BY AND VERIFIED WITH: B GREEN PHARMD 07/19/17 0223 JDW    Culture GRAM NEGATIVE RODS  Final   Report Status PENDING  Incomplete  Blood Culture ID Panel (Reflexed)     Status: Abnormal   Collection Time: 07/18/17  9:33 AM  Result Value Ref Range Status   Enterococcus species NOT DETECTED NOT DETECTED Final   Listeria monocytogenes NOT DETECTED NOT DETECTED Final   Staphylococcus species NOT DETECTED NOT DETECTED Final   Staphylococcus aureus NOT DETECTED NOT DETECTED Final   Streptococcus species NOT DETECTED NOT  DETECTED Final   Streptococcus agalactiae NOT DETECTED NOT DETECTED Final   Streptococcus pneumoniae NOT DETECTED NOT DETECTED Final   Streptococcus pyogenes NOT DETECTED NOT DETECTED Final   Acinetobacter baumannii NOT DETECTED NOT DETECTED Final   Enterobacteriaceae species DETECTED (A) NOT DETECTED Final    Comment: Enterobacteriaceae represent a large family of gram negative bacteria, not a single organism. Refer to culture for further identification. CRITICAL RESULT CALLED TO, READ BACK BY AND VERIFIED WITH: B GREEN PHARMD 07/19/17 0223 JDW    Enterobacter cloacae complex NOT DETECTED NOT DETECTED Final   Escherichia coli NOT DETECTED NOT DETECTED Final   Klebsiella oxytoca NOT DETECTED NOT DETECTED Final   Klebsiella pneumoniae NOT DETECTED NOT DETECTED Final   Proteus species NOT DETECTED NOT DETECTED Final   Serratia marcescens NOT DETECTED NOT DETECTED Final   Carbapenem resistance NOT DETECTED NOT DETECTED Final   Haemophilus influenzae NOT DETECTED NOT DETECTED Final   Neisseria meningitidis NOT DETECTED NOT DETECTED Final   Pseudomonas aeruginosa NOT DETECTED NOT DETECTED Final   Candida albicans NOT DETECTED NOT DETECTED Final   Candida glabrata NOT DETECTED NOT DETECTED Final   Candida krusei NOT DETECTED NOT DETECTED Final   Candida parapsilosis NOT DETECTED NOT DETECTED Final   Candida tropicalis NOT DETECTED NOT DETECTED Final  Culture, blood (routine x 2)     Status: None (Preliminary result)   Collection Time: 07/18/17  9:38 AM  Result Value Ref Range Status   Specimen Description   Final    BLOOD LEFT HAND Performed at [facility], [place] [address], [place], [place] [address]    Special Requests   Final    BOTTLES DRAWN AEROBIC ONLY Blood Culture results may not be optimal due to an inadequate volume of blood received in culture bottles Performed at [place] [address] [place], [place] [address]    Culture   Final      NO GROWTH 1 DAY Performed at [facility], [place] [address], [place], [place] [address]    Report Status PENDING  Incomplete  Culture, blood (routine x 2)     Status: None (Preliminary result)   Collection Time: 07/18/17 10:38 PM  Result Value Ref Range Status   Specimen Description   Final    BLOOD LEFT HAND Performed at [place] [address], [place], [place] [address]    Special Requests   Final    BOTTLES DRAWN AEROBIC AND ANAEROBIC Blood Culture adequate volume Performed at [facility] [address] [place], [place] [address]    Culture  Setup Time   Final    GRAM NEGATIVE RODS IN BOTH AEROBIC AND ANAEROBIC BOTTLES IDENTIFICATION TO FOLLOW CRITICAL VALUE NOTED.  VALUE IS CONSISTENT WITH PREVIOUSLY REPORTED AND CALLED VALUE. Performed at [facility], [place] [address], [place], [place] [address]    Culture GRAM NEGATIVE RODS  Final   Report Status PENDING  Incomplete  Culture, blood (routine x 2)     Status: None (Preliminary result)   Collection Time: 07/18/17 10:38 PM  Result Value Ref Range Status   Specimen Description   Final    BLOOD RIGHT HAND Performed at [facility] [place] [address], [place], [place] [address]    Special Requests   Final    BOTTLES DRAWN AEROBIC AND ANAEROBIC Blood Culture adequate volume Performed at [facility] [address] [place], [place] [address]    Culture  Setup Time   Final    GRAM NEGATIVE RODS IN BOTH AEROBIC AND ANAEROBIC BOTTLES CRITICAL RESULT CALLED TO, READ BACK BY AND VERIFIED WITH: Marcy Siren PharmD 16:25 07/19/17 ([email or web address]) Performed at [facility], [place] [address], [place], [place] [address]    Culture GRAM NEGATIVE RODS  Final   Report Status PENDING  Incomplete  Blood Culture ID Panel (Reflexed)     Status: Abnormal   Collection Time: 07/18/17 10:38 PM  Result Value Ref Range Status   Enterococcus species NOT  DETECTED NOT DETECTED Final    Comment: RESISTANT   Listeria monocytogenes NOT DETECTED NOT DETECTED Final   Staphylococcus species NOT DETECTED NOT DETECTED Final   Staphylococcus aureus NOT DETECTED NOT DETECTED Final   Streptococcus species NOT DETECTED NOT DETECTED Final   Streptococcus agalactiae NOT DETECTED NOT DETECTED Final   Streptococcus pneumoniae NOT DETECTED NOT DETECTED Final   Streptococcus pyogenes NOT DETECTED NOT DETECTED Final   Acinetobacter baumannii NOT DETECTED NOT DETECTED Final   Enterobacteriaceae species DETECTED (A) NOT DETECTED Final    Comment: Enterobacteriaceae represent a large family of gram negative bacteria, not a single organism. Refer to culture for further identification. CRITICAL RESULT CALLED TO, READ BACK BY AND VERIFIED WITH: Marcy Siren PharmD 16:25 07/19/17 ([email or web address])    Enterobacter cloacae complex NOT DETECTED NOT DETECTED Final   Escherichia coli NOT DETECTED NOT DETECTED Final   Klebsiella oxytoca NOT DETECTED NOT DETECTED Final   Klebsiella pneumoniae NOT DETECTED NOT DETECTED Final   Proteus species NOT DETECTED NOT DETECTED Final   Serratia marcescens NOT DETECTED NOT DETECTED Final   Carbapenem resistance NOT DETECTED NOT DETECTED Final   Haemophilus influenzae NOT DETECTED NOT DETECTED Final   Neisseria meningitidis NOT DETECTED NOT DETECTED Final   Pseudomonas aeruginosa NOT DETECTED NOT DETECTED Final   Candida albicans NOT DETECTED NOT DETECTED Final   Candida glabrata NOT DETECTED NOT DETECTED Final   Candida krusei NOT DETECTED NOT DETECTED Final   Candida parapsilosis NOT DETECTED NOT DETECTED Final   Candida tropicalis NOT DETECTED NOT DETECTED Final    Comment: Performed at [facility], [place] [address] [place],  [address]  Studies: No results found.  Scheduled Meds: . budesonide  3 mg Oral Daily  . estradiol  0.5 mg Oral Daily  . iron polysaccharides  150 mg Oral Daily  . metoprolol tartrate  12.5 mg  Oral BID  . mirtazapine  15 mg Oral QHS  . vitamin B-12  100 mcg Oral Daily    Continuous Infusions: . sodium chloride 75 mL/hr (07/20/17 0350)  . cefTRIAXone (ROCEPHIN)  IV Stopped (07/19/17 1611)  . lactated ringers    . magnesium sulfate 1 - 4 g bolus IVPB       Time spent: 91mins, I have personally reviewed and interpreted on  07/20/2017 daily labs, tele strips, imagings as discussed above under date review session and assessment and plans.  I reviewed all nursing notes, pharmacy notes, consultant notes,  vitals, pertinent old records  I have discussed plan of care as described above with RN , patient and family on 07/20/2017   Florencia Reasons MD, PhD  [facility] Pager [phone]. If 7PM-7AM, please contact night-coverage at [email or web address], password [facility] 07/20/2017, 8:59 AM  LOS: 3 days

## 2017-07-21 DIAGNOSIS — A021 Salmonella sepsis: Principal | ICD-10-CM

## 2017-07-21 DIAGNOSIS — C689 Malignant neoplasm of urinary organ, unspecified: Secondary | ICD-10-CM

## 2017-07-21 DIAGNOSIS — R7881 Bacteremia: Secondary | ICD-10-CM

## 2017-07-21 DIAGNOSIS — D899 Disorder involving the immune mechanism, unspecified: Secondary | ICD-10-CM

## 2017-07-21 LAB — TYPE AND SCREEN
ABO/RH(D): A POS
ANTIBODY SCREEN: NEGATIVE

## 2017-07-21 LAB — BASIC METABOLIC PANEL
ANION GAP: 10 (ref 5–15)
BUN: 22 mg/dL — ABNORMAL HIGH (ref 6–20)
CALCIUM: 7.7 mg/dL — AB (ref 8.9–10.3)
CO2: 18 mmol/L — ABNORMAL LOW (ref 22–32)
Chloride: 107 mmol/L (ref 101–111)
Creatinine, Ser: 1.15 mg/dL — ABNORMAL HIGH (ref 0.44–1.00)
GFR calc non Af Amer: 40 mL/min — ABNORMAL LOW (ref >60)
GFR, EST AFRICAN AMERICAN: 47 mL/min — AB (ref >60)
GLUCOSE: 103 mg/dL — AB (ref 65–99)
POTASSIUM: 3.7 mmol/L (ref 3.5–5.1)
SODIUM: 135 mmol/L (ref 135–145)

## 2017-07-21 LAB — LACTIC ACID, PLASMA: Lactic Acid, Venous: 0.7 mmol/L (ref 0.5–1.9)

## 2017-07-21 LAB — CULTURE, BLOOD (ROUTINE X 2)
Special Requests: ADEQUATE
Special Requests: ADEQUATE

## 2017-07-21 LAB — CBC
HCT: 23.2 % — ABNORMAL LOW (ref 36.0–46.0)
Hemoglobin: 7.2 g/dL — ABNORMAL LOW (ref 12.0–15.0)
MCH: 25.7 pg — ABNORMAL LOW (ref 26.0–34.0)
MCHC: 31 g/dL (ref 30.0–36.0)
MCV: 82.9 fL (ref 78.0–100.0)
PLATELETS: 275 10*3/uL (ref 150–400)
RBC: 2.8 MIL/uL — AB (ref 3.87–5.11)
RDW: 17.6 % — ABNORMAL HIGH (ref 11.5–15.5)
WBC: 12.4 10*3/uL — AB (ref 4.0–10.5)

## 2017-07-21 LAB — MAGNESIUM: MAGNESIUM: 1.9 mg/dL (ref 1.7–2.4)

## 2017-07-21 MED ORDER — POTASSIUM CHLORIDE CRYS ER 10 MEQ PO TBCR
40.0000 meq | EXTENDED_RELEASE_TABLET | Freq: Once | ORAL | Status: AC
  Administered 2017-07-21: 40 meq via ORAL
  Filled 2017-07-21: qty 4

## 2017-07-21 NOTE — Progress Notes (Signed)
PROGRESS NOTE  Tiffany [name] ZOX:[id number] DOB: 04/16/1924 DOA: 07/17/2017 PCP: [name], MD  HPI/Recap of past 24 hours:  Spike fever again, 102.7 yesterday afternoon She feel better today, last bm was 3am, large amount but no blood  ab pain has resolved, no n/v, she wants to eat more    Assessment/Plan: Active Problems:   Diarrhea in adult patient  Sepsis from Salmonella enteritis and bacteremia in an immunosuppressed individual : -she presented with Bloody Diarrhea, lower abdominal cramping, acute blood loss anemia, orthostatic hypotension, dizziness, fever,  lactic acidosis, wbc elevated at 15.4 on second day of admission.  -C. difficile is negative, GI PCR panel + salmonella, blood culture + g-rods, bcid + enterobacteriaceae species -she is started on cipro/flagyl over night (4/18-4/19), I discussed case with infectious disease Dr Baxter Flattery who recommended change abx to rocephin 2g daily, for total of 14days treatment. -repeat blood culture on 4/20 to ensure clearance. If repeat blood culture no growth, no need of echocardiogram. Currently patient dose not have heart murmur, no back pain, no headache or neck stiffness. She dose has a chest port that was placed two yrs ago for cancer treatment -will need picc line placement once repeat blood culture negative -continue hydration , patient continue to have diarrhea   Acute blood loss anemia on anemia of chronic disease -baseline hgb around 9 -hgb on admission 8.4 then 8.2 then 8.4 then 7.4,  -Monitor hemoglobin, may need prbc transfusion if continue to trend down  H/o large internal hemorrhoids on flex sig from 2/18  history of ulcerative proctitis (flex sig 2/18 by Dr. Wynetta Emery) and has been on Budesonide, prednisone rectal foam and mesalamine suppositories in the past without benefit per Dr. Durenda Age note in 2018.  [facility] consulted, will not plan to repeat flex sig at this moment, GI do not recommend steroid in the setting  of infection.  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 Left message for Dr Grier Mitts RN.     Code Status: DNR  Family Communication: patient and family  Disposition Plan: not ready for discharge   Consultants:  [facility]  Oncology  ID   Procedures:  none  Antibiotics:  Cipro/flagyl on 4/18-4/19 night  Rocephin 2g daily since 4/19   Objective: BP (!) 100/45 (BP Location: Left Arm)   Pulse 85   Temp 98.2 F (36.8 C) (Oral)   Resp 17   Ht 5' 0.98" (1.549 m)   Wt 56.4 kg (124 lb 5.4 oz)   SpO2 95%   BMI 23.51 kg/m   Intake/Output Summary (Last 24 hours) at 07/21/2017 0834 Last data filed at 07/20/2017 1445 Gross per 24 hour  Intake 240 ml  Output -  Net 240 ml   Filed Weights   07/18/17 0754 07/20/17 0300  Weight: 55.8 kg (123 lb) 56.4 kg (124 lb 5.4 oz)    Exam: Patient is examined daily including today on 07/21/2017, exams remain the same as of yesterday except that has changed  General:  Frail elderly,  NAD, + oral thrush  Cardiovascular: RRR  Respiratory: CTABL  Abdomen: soft/NT/ND,  positive BS  Musculoskeletal: No Edema  Neuro: alert, oriented   Data Reviewed: Basic Metabolic Panel: Recent Labs  Lab 07/17/17 1341 07/18/17 0443 07/19/17 0415 07/20/17 0444 07/21/17 0449  NA 135 138 134* 139 135  K 4.5 4.5 4.8 3.9 3.7  CL 102 106 106 111 107  CO2 23 23 18* 19* 18*  GLUCOSE 107* 105* 125* 107* 103*  BUN 25* 18 18 26* 22*    CREATININE 1.25* 1.09* 1.20* 1.39* 1.15*  CALCIUM 9.2 8.8* 8.3* 8.2* 7.7*  MG 1.8  --  1.4* 1.6* 1.9   Liver Function Tests: Recent Labs  Lab 07/16/17 0915 07/18/17 0443  AST 17 17  ALT 11* 10*  ALKPHOS 39 39  BILITOT 0.6 0.6  PROT 7.5 6.9  ALBUMIN 3.0* 2.6*   No results for input(s): LIPASE, AMYLASE in the last 168 hours. No results for input(s): AMMONIA in the last 168 hours. CBC: Recent Labs  Lab 07/17/17 1341 07/18/17 0443 07/19/17 0415 07/20/17 0444 07/21/17 0449  WBC 7.5 8.7 15.4* 13.1* 12.4*  NEUTROABS 4.2  --  10.2*  --   --   HGB 8.4* 8.2* 8.4* 7.4* 7.2*  HCT 28.0* 27.2* 27.3* 24.0* 23.2*  MCV 83.8 84.7 84.3 83.9 82.9  PLT 347 329 315 269 275   Cardiac Enzymes:   No results for input(s): CKTOTAL, CKMB, CKMBINDEX, TROPONINI in the last 168 hours. BNP (last 3 results) No results for input(s): BNP in the last 8760 hours.  ProBNP (last 3 results) No results for input(s): PROBNP in the last 8760 hours.  CBG: No results for input(s): GLUCAP in the last 168 hours.  Recent Results (from the past 240 hour(s))  C difficile quick scan w PCR reflex     Status: None   Collection Time: 07/18/17  1:00 AM  Result Value Ref Range Status   C Diff antigen NEGATIVE NEGATIVE Final   C Diff toxin NEGATIVE NEGATIVE Final   C Diff interpretation No C. difficile detected.  Final    Comment: Performed at [facility], [place] [address] [place], [place] [address]  Gastrointestinal Panel by PCR , Stool     Status: Abnormal   Collection Time: 07/18/17  1:00 AM  Result Value Ref Range Status   Campylobacter species NOT DETECTED NOT DETECTED Final   Plesimonas shigelloides NOT DETECTED NOT DETECTED Final   Salmonella species DETECTED (A) NOT DETECTED Final    Comment: RESULT CALLED TO, READ BACK BY AND VERIFIED WITH: MACKENZIE HASTINGS @ 2114 ON 07/18/2017 BY CAF    Yersinia enterocolitica NOT DETECTED NOT DETECTED Final   Vibrio species NOT DETECTED NOT  DETECTED Final   Vibrio cholerae NOT DETECTED NOT DETECTED Final   Enteroaggregative E coli (EAEC) NOT DETECTED NOT DETECTED Final   Enteropathogenic E coli (EPEC) NOT DETECTED NOT DETECTED Final   Enterotoxigenic E coli (ETEC) NOT DETECTED NOT DETECTED Final   Shiga like toxin producing E coli (STEC) NOT DETECTED NOT DETECTED Final   Shigella/Enteroinvasive E coli (EIEC) NOT DETECTED NOT DETECTED Final   Cryptosporidium NOT DETECTED NOT DETECTED Final   Cyclospora cayetanensis NOT DETECTED NOT DETECTED Final   Entamoeba histolytica NOT DETECTED NOT DETECTED Final   Giardia lamblia NOT DETECTED NOT DETECTED Final   Adenovirus F40/41 NOT DETECTED NOT DETECTED Final   Astrovirus NOT DETECTED NOT DETECTED Final   Norovirus GI/GII NOT DETECTED  NOT DETECTED Final   Rotavirus A NOT DETECTED NOT DETECTED Final   Sapovirus (I, II, IV, and V) NOT DETECTED NOT DETECTED Final    Comment: Performed at [facility] [place], [address] [place], [place] [address]  Culture, blood (routine x 2)     Status: Abnormal (Preliminary result)   Collection Time: 07/18/17  9:33 AM  Result Value Ref Range Status   Specimen Description   Final    BLOOD RIGHT HAND Performed at [place] [address], [place], [place] [address]    Special Requests   Final    BOTTLES DRAWN AEROBIC AND ANAEROBIC Blood Culture adequate volume Performed at [place] [address] [place], [place] [address]    Culture  Setup Time   Final    GRAM NEGATIVE RODS IN BOTH AEROBIC AND ANAEROBIC BOTTLES CRITICAL RESULT CALLED TO, READ BACK BY AND VERIFIED WITH: B GREEN PHARMD 07/19/17 0223 JDW    Culture (A)  Final    [facility] SPECIES HEALTH DEPARTMENT NOTIFIED Performed at [facility], [place] [address], [place], [place] [address]    Report Status PENDING  Incomplete  Blood Culture ID Panel (Reflexed)     Status: Abnormal   Collection Time: 07/18/17  9:33 AM  Result Value  Ref Range Status   Enterococcus species NOT DETECTED NOT DETECTED Final   Listeria monocytogenes NOT DETECTED NOT DETECTED Final   Staphylococcus species NOT DETECTED NOT DETECTED Final   Staphylococcus aureus NOT DETECTED NOT DETECTED Final   Streptococcus species NOT DETECTED NOT DETECTED Final   Streptococcus agalactiae NOT DETECTED NOT DETECTED Final   Streptococcus pneumoniae NOT DETECTED NOT DETECTED Final   Streptococcus pyogenes NOT DETECTED NOT DETECTED Final   Acinetobacter baumannii NOT DETECTED NOT DETECTED Final   Enterobacteriaceae species DETECTED (A) NOT DETECTED Final    Comment: Enterobacteriaceae represent a large family of gram negative bacteria, not a single organism. Refer to culture for further identification. CRITICAL RESULT CALLED TO, READ BACK BY AND VERIFIED WITH: B GREEN PHARMD 07/19/17 0223 JDW    Enterobacter cloacae complex NOT DETECTED NOT DETECTED Final   Escherichia coli NOT DETECTED NOT DETECTED Final   Klebsiella oxytoca NOT DETECTED NOT DETECTED Final   Klebsiella pneumoniae NOT DETECTED NOT DETECTED Final   Proteus species NOT DETECTED NOT DETECTED Final   Serratia marcescens NOT DETECTED NOT DETECTED Final   Carbapenem resistance NOT DETECTED NOT DETECTED Final   Haemophilus influenzae NOT DETECTED NOT DETECTED Final   Neisseria meningitidis NOT DETECTED NOT DETECTED Final   Pseudomonas aeruginosa NOT DETECTED NOT DETECTED Final   Candida albicans NOT DETECTED NOT DETECTED Final   Candida glabrata NOT DETECTED NOT DETECTED Final   Candida krusei NOT DETECTED NOT DETECTED Final   Candida parapsilosis NOT DETECTED NOT DETECTED Final   Candida tropicalis NOT DETECTED NOT DETECTED Final  Culture, blood (routine x 2)     Status: None (Preliminary result)   Collection Time: 07/18/17  9:38 AM  Result Value Ref Range Status   Specimen Description   Final    BLOOD LEFT HAND Performed at [facility], [place] [address] [place],  [place] [address]    Special Requests   Final    BOTTLES DRAWN AEROBIC ONLY Blood Culture results may not be optimal due to an inadequate volume of blood received in culture bottles Performed at [place] [address], [place], [place] [address]    Culture  Final    NO GROWTH 2 DAYS Performed at [facility], [place] [address], [place], [place] [address]    Report Status PENDING  Incomplete  Culture, blood (routine x 2)     Status: Abnormal (Preliminary result)   Collection Time: 07/18/17 10:38 PM  Result Value Ref Range Status   Specimen Description   Final    BLOOD LEFT HAND Performed at [place] [address], [place], [place] [address]    Special Requests   Final    BOTTLES DRAWN AEROBIC AND ANAEROBIC Blood Culture adequate volume Performed at [facility] [address] [place], [place] [address]    Culture  Setup Time   Final    GRAM NEGATIVE RODS IN BOTH AEROBIC AND ANAEROBIC BOTTLES IDENTIFICATION TO FOLLOW CRITICAL VALUE NOTED.  VALUE IS CONSISTENT WITH PREVIOUSLY REPORTED AND CALLED VALUE.    Culture (A)  Final    [facility] SPECIES HEALTH DEPARTMENT NOTIFIED Performed at [facility], [place] [address] [place], [place] [address]    Report Status PENDING  Incomplete  Culture, blood (routine x 2)     Status: Abnormal (Preliminary result)   Collection Time: 07/18/17 10:38 PM  Result Value Ref Range Status   Specimen Description   Final    BLOOD RIGHT HAND Performed at [place] [address], [place],  [address]    Special Requests   Final    BOTTLES DRAWN AEROBIC AND ANAEROBIC Blood Culture adequate volume Performed at [facility] [address], [place],  [address]    Culture  Setup Time   Final    GRAM NEGATIVE RODS IN BOTH AEROBIC AND ANAEROBIC BOTTLES CRITICAL RESULT CALLED TO, READ BACK BY AND VERIFIED WITH: Marcy Siren PharmD  16:25 07/19/17 ([email or web address])    Culture (A)  Final    [place] NOTIFIED Referred to [facility] in [place], [place] for [place]. Performed at [facility], [place] [address], [place],  [address]    Report Status PENDING  Incomplete   Organism ID, Bacteria SALMONELLA SPECIES  Final      Susceptibility   Salmonella species - MIC*    AMPICILLIN <=2 SENSITIVE Sensitive     LEVOFLOXACIN <=0.12 SENSITIVE Sensitive     TRIMETH/SULFA <=20 SENSITIVE Sensitive     * SALMONELLA SPECIES  Blood Culture ID Panel (Reflexed)     Status: Abnormal   Collection Time: 07/18/17 10:38 PM  Result Value Ref Range Status   Enterococcus species NOT DETECTED NOT DETECTED Final    Comment: RESISTANT   Listeria monocytogenes NOT DETECTED NOT DETECTED Final   Staphylococcus species NOT DETECTED NOT DETECTED Final   Staphylococcus aureus NOT DETECTED NOT DETECTED Final   Streptococcus species NOT DETECTED NOT DETECTED Final   Streptococcus agalactiae NOT DETECTED NOT DETECTED Final   Streptococcus pneumoniae NOT DETECTED NOT DETECTED Final   Streptococcus pyogenes NOT DETECTED NOT DETECTED Final   Acinetobacter baumannii NOT DETECTED NOT DETECTED Final   Enterobacteriaceae species DETECTED (A) NOT DETECTED Final    Comment: Enterobacteriaceae represent a large family of gram negative bacteria, not a single organism. Refer to culture for further identification. CRITICAL RESULT CALLED TO, READ BACK BY AND VERIFIED WITH: Marcy Siren PharmD 16:25 07/19/17 ([email or web address])    Enterobacter cloacae complex NOT DETECTED NOT DETECTED Final   Escherichia coli NOT DETECTED NOT DETECTED Final   Klebsiella oxytoca NOT DETECTED NOT DETECTED  Final   Klebsiella pneumoniae NOT DETECTED NOT DETECTED Final   Proteus species NOT DETECTED NOT DETECTED Final   Serratia marcescens NOT DETECTED NOT DETECTED Final   Carbapenem resistance NOT DETECTED NOT DETECTED Final    Haemophilus influenzae NOT DETECTED NOT DETECTED Final   Neisseria meningitidis NOT DETECTED NOT DETECTED Final   Pseudomonas aeruginosa NOT DETECTED NOT DETECTED Final   Candida albicans NOT DETECTED NOT DETECTED Final   Candida glabrata NOT DETECTED NOT DETECTED Final   Candida krusei NOT DETECTED NOT DETECTED Final   Candida parapsilosis NOT DETECTED NOT DETECTED Final   Candida tropicalis NOT DETECTED NOT DETECTED Final    Comment: Performed at [facility], [place] [address], [place], [place] [address]     Studies: No results found.  Scheduled Meds: . budesonide  3 mg Oral Daily  . estradiol  0.5 mg Oral Daily  . iron polysaccharides  150 mg Oral Daily  . metoprolol tartrate  12.5 mg Oral BID  . mirtazapine  15 mg Oral QHS  . nystatin  5 mL Oral QID  . potassium chloride  40 mEq Oral Once  . sodium bicarbonate  650 mg Oral BID  . vitamin B-12  100 mcg Oral Daily    Continuous Infusions: . sodium chloride 75 mL/hr at 07/20/17 2326  . cefTRIAXone (ROCEPHIN)  IV Stopped (07/20/17 1702)     Time spent: 46mins, I have personally reviewed and interpreted on  07/21/2017 daily labs, tele strips, imagings as discussed above under date review session and assessment and plans.  I reviewed all nursing notes, pharmacy notes, consultant notes,  vitals, pertinent old records  I have discussed plan of care as described above with RN , patient  on 07/21/2017   Florencia Reasons MD, PhD  [facility] Pager [phone]. If 7PM-7AM, please contact night-coverage at [email or web address], password [facility] 07/21/2017, 8:34 AM  LOS: 4 days

## 2017-07-22 LAB — BASIC METABOLIC PANEL
Anion gap: 9 (ref 5–15)
BUN: 15 mg/dL (ref 6–20)
CHLORIDE: 109 mmol/L (ref 101–111)
CO2: 19 mmol/L — ABNORMAL LOW (ref 22–32)
CREATININE: 0.97 mg/dL (ref 0.44–1.00)
Calcium: 7.2 mg/dL — ABNORMAL LOW (ref 8.9–10.3)
GFR calc non Af Amer: 50 mL/min — ABNORMAL LOW (ref >60)
GFR, EST AFRICAN AMERICAN: 57 mL/min — AB (ref >60)
Glucose, Bld: 113 mg/dL — ABNORMAL HIGH (ref 65–99)
POTASSIUM: 3.8 mmol/L (ref 3.5–5.1)
SODIUM: 137 mmol/L (ref 135–145)

## 2017-07-22 LAB — CBC
HCT: 24.7 % — ABNORMAL LOW (ref 36.0–46.0)
HEMOGLOBIN: 7.7 g/dL — AB (ref 12.0–15.0)
MCH: 25.8 pg — ABNORMAL LOW (ref 26.0–34.0)
MCHC: 31.2 g/dL (ref 30.0–36.0)
MCV: 82.9 fL (ref 78.0–100.0)
Platelets: 279 10*3/uL (ref 150–400)
RBC: 2.98 MIL/uL — ABNORMAL LOW (ref 3.87–5.11)
RDW: 17.4 % — ABNORMAL HIGH (ref 11.5–15.5)
WBC: 16.6 10*3/uL — ABNORMAL HIGH (ref 4.0–10.5)

## 2017-07-22 LAB — LACTIC ACID, PLASMA: LACTIC ACID, VENOUS: 0.9 mmol/L (ref 0.5–1.9)

## 2017-07-22 LAB — MAGNESIUM: MAGNESIUM: 1.7 mg/dL (ref 1.7–2.4)

## 2017-07-22 NOTE — Progress Notes (Signed)
PROGRESS NOTE  Tiffany [name] TOI:[id number] DOB: 13-Sep-1924 DOA: 07/17/2017 PCP: [name], MD  HPI/Recap of past 24 hours:  tmax 99.8, continue to have diarrhea, no pain,  She feels like she can eat more     Assessment/Plan: Active Problems:   Diarrhea in adult patient  Sepsis from Salmonella enteritis and bacteremia in an immunosuppressed individual : -she presented with Bloody Diarrhea, lower abdominal cramping, acute blood loss anemia, orthostatic hypotension, dizziness, fever,  lactic acidosis, wbc elevated at 15.4 on second day of admission.  -C. difficile is negative, GI PCR panel + salmonella, blood culture + g-rods, bcid + enterobacteriaceae species -she is started on cipro/flagyl over night (4/18-4/19), I discussed case with infectious disease Dr Baxter Flattery who recommended change abx to rocephin 2g daily, for total of 14days treatment. -repeat blood culture on 4/20 to ensure clearance. If repeat blood culture no growth, no need of echocardiogram. Currently patient dose not have heart murmur, no back pain, no headache or neck stiffness. She dose has a chest port that was placed two yrs ago for cancer treatment -report blood culture no growth -continue hydration , patient continue to have diarrhea -she can go home to finish iv rocephin through port once diarrhea slows down and fever free for 24hrs( I discussed case with ID Dr Linus Salmons state can keep port in)   Acute blood loss anemia on anemia of chronic disease -baseline hgb around 9 -hgb on admission 8.4 then 8.2 then 8.4 then 7.4,  -Monitor hemoglobin, may need prbc transfusion if continue to trend down  H/o large internal hemorrhoids on flex sig from 2/18  history of ulcerative proctitis (flex sig 2/18 by Dr. Wynetta Emery) and has been on Budesonide, prednisone rectal foam and mesalamine suppositories in the past without benefit per Dr. Durenda Age note in 2018.  [facility] consulted, will not plan to repeat flex sig at this  moment, GI do not recommend steroid in the setting of infection.  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 Left message for Dr Grier Mitts RN.     Code Status: DNR  Family Communication: patient and family  Disposition Plan: home with home health IV abx , in 1-2 days   Consultants:  [facility]  Oncology  ID   Procedures:  none  Antibiotics:  Cipro/flagyl on 4/18-4/19 night  Rocephin 2g daily since 4/19   Objective: BP (!) 105/49 (BP Location: Right Arm)   Pulse 90   Temp 99.8 F (37.7 C) (Oral)   Resp (!) 24   Ht 5' 0.98" (1.549 m)   Wt 56.4 kg (124 lb 5.4 oz)   SpO2 96%   BMI 23.51 kg/m  No intake or output data in the 24 hours ending 07/22/17 1645 Filed Weights   07/18/17 0754 07/20/17 0300  Weight: 55.8 kg (123 lb) 56.4 kg (124 lb 5.4 oz)    Exam: Patient is examined daily including today on 07/22/2017, exams remain the same as of yesterday except that has changed  General:  Frail elderly,  NAD, + oral thrush  Cardiovascular: RRR  Respiratory: CTABL  Abdomen: soft/NT/ND,  positive BS  Musculoskeletal: No Edema  Neuro: alert, oriented   Data Reviewed: Basic Metabolic Panel: Recent Labs  Lab 07/17/17 1341 07/18/17 0443 07/19/17 0415 07/20/17 0444 07/21/17 0449 07/22/17 0539  NA 135 138 134* 139 135 137  K 4.5 4.5 4.8 3.9 3.7 3.8  CL 102 106 106 111 107 109  CO2 23 23 18* 19* 18* 19*  GLUCOSE 107* 105* 125* 107* 103* 113*  BUN 25* 18  18 26* 22* 15  CREATININE 1.25* 1.09* 1.20* 1.39* 1.15* 0.97  CALCIUM 9.2 8.8* 8.3* 8.2* 7.7* 7.2*  MG 1.8  --  1.4* 1.6* 1.9 1.7   Liver Function Tests: Recent Labs  Lab 07/16/17 0915 07/18/17 0443  AST 17 17  ALT 11* 10*  ALKPHOS 39 39  BILITOT 0.6 0.6  PROT 7.5 6.9  ALBUMIN 3.0* 2.6*   No results for input(s): LIPASE, AMYLASE in the last 168 hours. No results for input(s): AMMONIA in the last 168 hours. CBC: Recent Labs  Lab 07/17/17 1341 07/18/17 0443 07/19/17 0415 07/20/17 0444 07/21/17 0449 07/22/17 0539  WBC 7.5 8.7 15.4* 13.1* 12.4* 16.6*  NEUTROABS 4.2  --  10.2*  --   --   --   HGB 8.4* 8.2* 8.4* 7.4* 7.2* 7.7*  HCT 28.0* 27.2* 27.3* 24.0* 23.2* 24.7*  MCV 83.8 84.7 84.3 83.9 82.9 82.9  PLT 347 329 315 269 275 279   Cardiac Enzymes:   No results for input(s): CKTOTAL, CKMB, CKMBINDEX, TROPONINI in the last 168 hours. BNP (last 3 results) No results for input(s): BNP in the last 8760 hours.  ProBNP (last 3 results) No results for input(s): PROBNP in the last 8760 hours.  CBG: No results for input(s): GLUCAP in the last 168 hours.  Recent Results (from the past 240 hour(s))  C difficile quick scan w PCR reflex     Status: None   Collection Time: 07/18/17  1:00 AM  Result Value Ref Range Status   C Diff antigen NEGATIVE NEGATIVE Final   C Diff toxin NEGATIVE NEGATIVE Final   C Diff interpretation No C. difficile detected.  Final    Comment: Performed at [facility], [place] [address], [place], [place] [address]  Gastrointestinal Panel by PCR , Stool     Status: Abnormal   Collection Time: 07/18/17  1:00 AM  Result Value Ref Range Status   Campylobacter species NOT DETECTED NOT DETECTED Final   Plesimonas shigelloides NOT DETECTED NOT DETECTED Final   Salmonella species DETECTED (A) NOT DETECTED Final    Comment: RESULT CALLED TO, READ BACK BY AND VERIFIED WITH: MACKENZIE HASTINGS @ 2114 ON 07/18/2017 BY CAF    Yersinia  enterocolitica NOT DETECTED NOT DETECTED Final   Vibrio species NOT DETECTED NOT DETECTED Final   Vibrio cholerae NOT DETECTED NOT DETECTED Final   Enteroaggregative E coli (EAEC) NOT DETECTED NOT DETECTED Final   Enteropathogenic E coli (EPEC) NOT DETECTED NOT DETECTED Final   Enterotoxigenic E coli (ETEC) NOT DETECTED NOT DETECTED Final   Shiga like toxin producing E coli (STEC) NOT DETECTED NOT DETECTED Final   Shigella/Enteroinvasive E coli (EIEC) NOT DETECTED NOT DETECTED Final   Cryptosporidium NOT DETECTED NOT DETECTED Final   Cyclospora cayetanensis NOT DETECTED NOT DETECTED Final   Entamoeba histolytica NOT DETECTED NOT DETECTED Final   Giardia lamblia NOT DETECTED NOT DETECTED Final   Adenovirus  F40/41 NOT DETECTED NOT DETECTED Final   Astrovirus NOT DETECTED NOT DETECTED Final   Norovirus GI/GII NOT DETECTED NOT DETECTED Final   Rotavirus A NOT DETECTED NOT DETECTED Final   Sapovirus (I, II, IV, and V) NOT DETECTED NOT DETECTED Final    Comment: Performed at [facility], [place] [address] [place], [place] [address]  Culture, blood (routine x 2)     Status: Abnormal   Collection Time: 07/18/17  9:33 AM  Result Value Ref Range Status   Specimen Description   Final    BLOOD RIGHT HAND Performed at [facility] [address], [place], [place] [address]    Special Requests   Final    BOTTLES DRAWN AEROBIC AND ANAEROBIC Blood Culture adequate volume Performed at [facility] [address] [place], [place] [address]    Culture  Setup Time   Final    GRAM NEGATIVE RODS IN BOTH AEROBIC AND ANAEROBIC BOTTLES CRITICAL RESULT CALLED TO, READ BACK BY AND VERIFIED WITH: B GREEN PHARMD 07/19/17 0223 JDW    Culture (A)  Final    SALMONELLA SPECIES SUSCEPTIBILITIES PERFORMED ON PREVIOUS CULTURE WITHIN THE LAST 5 DAYS. HEALTH DEPARTMENT NOTIFIED Performed at [facility], [place] [address], [place], [place] [address]    Report  Status 07/21/2017 FINAL  Final  Blood Culture ID Panel (Reflexed)     Status: Abnormal   Collection Time: 07/18/17  9:33 AM  Result Value Ref Range Status   Enterococcus species NOT DETECTED NOT DETECTED Final   Listeria monocytogenes NOT DETECTED NOT DETECTED Final   Staphylococcus species NOT DETECTED NOT DETECTED Final   Staphylococcus aureus NOT DETECTED NOT DETECTED Final   Streptococcus species NOT DETECTED NOT DETECTED Final   Streptococcus agalactiae NOT DETECTED NOT DETECTED Final   Streptococcus pneumoniae NOT DETECTED NOT DETECTED Final   Streptococcus pyogenes NOT DETECTED NOT DETECTED Final   Acinetobacter baumannii NOT DETECTED NOT DETECTED Final   Enterobacteriaceae species DETECTED (A) NOT DETECTED Final    Comment: Enterobacteriaceae represent a large family of gram negative bacteria, not a single organism. Refer to culture for further identification. CRITICAL RESULT CALLED TO, READ BACK BY AND VERIFIED WITH: B GREEN PHARMD 07/19/17 0223 JDW    Enterobacter cloacae complex NOT DETECTED NOT DETECTED Final   Escherichia coli NOT DETECTED NOT DETECTED Final   Klebsiella oxytoca NOT DETECTED NOT DETECTED Final   Klebsiella pneumoniae NOT DETECTED NOT DETECTED Final   Proteus species NOT DETECTED NOT DETECTED Final   Serratia marcescens NOT DETECTED NOT DETECTED Final   Carbapenem resistance NOT DETECTED NOT DETECTED Final   Haemophilus influenzae NOT DETECTED NOT DETECTED Final   Neisseria meningitidis NOT DETECTED NOT DETECTED Final   Pseudomonas aeruginosa NOT DETECTED NOT DETECTED Final   Candida albicans NOT DETECTED NOT DETECTED Final   Candida glabrata NOT DETECTED NOT DETECTED Final   Candida krusei NOT DETECTED NOT DETECTED Final   Candida parapsilosis NOT DETECTED NOT DETECTED Final   Candida tropicalis NOT DETECTED NOT DETECTED Final  Culture, blood (routine x 2)     Status: None (Preliminary result)   Collection Time: 07/18/17  9:38 AM  Result Value Ref Range  Status   Specimen Description   Final    BLOOD LEFT HAND Performed at [facility], [place] [address], [place], [place] [address]    Special Requests   Final    BOTTLES DRAWN AEROBIC ONLY Blood Culture results may not be  optimal due to an inadequate volume of blood received in culture bottles Performed at [facility] [place] [address] [place], [place] [address]    Culture   Final    NO GROWTH 4 DAYS Performed at [facility], [place] [address] [place], [place] [address]    Report Status PENDING  Incomplete  Culture, blood (routine x 2)     Status: Abnormal   Collection Time: 07/18/17 10:38 PM  Result Value Ref Range Status   Specimen Description   Final    BLOOD LEFT HAND Performed at [facility] [address] [place], [place] [address]    Special Requests   Final    BOTTLES DRAWN AEROBIC AND ANAEROBIC Blood Culture adequate volume Performed at [facility] [address], [place], [place] [address]    Culture  Setup Time   Final    GRAM NEGATIVE RODS IN BOTH AEROBIC AND ANAEROBIC BOTTLES CRITICAL VALUE NOTED.  VALUE IS CONSISTENT WITH PREVIOUSLY REPORTED AND CALLED VALUE.    Culture (A)  Final    SALMONELLA SPECIES SUSCEPTIBILITIES PERFORMED ON PREVIOUS CULTURE WITHIN THE LAST 5 DAYS. HEALTH DEPARTMENT NOTIFIED Performed at [facility], [place] [address] [place], [place] [address]    Report Status 07/21/2017 FINAL  Final  Culture, blood (routine x 2)     Status: Abnormal (Preliminary result)   Collection Time: 07/18/17 10:38 PM  Result Value Ref Range Status   Specimen Description   Final    BLOOD RIGHT HAND Performed at [facility] [address], [place], [place] [address]    Special Requests   Final    BOTTLES DRAWN AEROBIC AND ANAEROBIC Blood Culture adequate volume Performed at [facility] [address], [place], [place] [address]     Culture  Setup Time   Final    GRAM NEGATIVE RODS IN BOTH AEROBIC AND ANAEROBIC BOTTLES CRITICAL RESULT CALLED TO, READ BACK BY AND VERIFIED WITH: Marcy Siren PharmD 16:25 07/19/17 ([email or web address])    Culture (A)  Final    [place] NOTIFIED Referred to [facility] in [place], [place] for [place]. Performed at [facility], [place] [address] [place], [place] [address]    Report Status PENDING  Incomplete   Organism ID, Bacteria SALMONELLA SPECIES  Final      Susceptibility   Salmonella species - MIC*    AMPICILLIN <=2 SENSITIVE Sensitive     LEVOFLOXACIN <=0.12 SENSITIVE Sensitive     TRIMETH/SULFA <=20 SENSITIVE Sensitive     * SALMONELLA SPECIES  Blood Culture ID Panel (Reflexed)     Status: Abnormal   Collection Time: 07/18/17 10:38 PM  Result Value Ref Range Status   Enterococcus species NOT DETECTED NOT DETECTED Final    Comment: RESISTANT   Listeria monocytogenes NOT DETECTED NOT DETECTED Final   Staphylococcus species NOT DETECTED NOT DETECTED Final   Staphylococcus aureus NOT DETECTED NOT DETECTED Final   Streptococcus species NOT DETECTED NOT DETECTED Final   Streptococcus agalactiae NOT DETECTED NOT DETECTED Final   Streptococcus pneumoniae NOT DETECTED NOT DETECTED Final   Streptococcus pyogenes NOT DETECTED NOT DETECTED Final   Acinetobacter baumannii NOT DETECTED NOT DETECTED Final   Enterobacteriaceae species DETECTED (A) NOT DETECTED Final    Comment: Enterobacteriaceae represent a large family of gram negative bacteria, not a single organism. Refer to culture for further identification. CRITICAL RESULT CALLED TO, READ BACK BY  AND VERIFIED WITH: Marcy Siren PharmD 16:25 07/19/17 ([email or web address])    Enterobacter cloacae complex NOT DETECTED NOT DETECTED Final   Escherichia coli NOT DETECTED NOT DETECTED Final   Klebsiella oxytoca NOT DETECTED NOT DETECTED Final   Klebsiella pneumoniae NOT DETECTED NOT DETECTED Final    Proteus species NOT DETECTED NOT DETECTED Final   Serratia marcescens NOT DETECTED NOT DETECTED Final   Carbapenem resistance NOT DETECTED NOT DETECTED Final   Haemophilus influenzae NOT DETECTED NOT DETECTED Final   Neisseria meningitidis NOT DETECTED NOT DETECTED Final   Pseudomonas aeruginosa NOT DETECTED NOT DETECTED Final   Candida albicans NOT DETECTED NOT DETECTED Final   Candida glabrata NOT DETECTED NOT DETECTED Final   Candida krusei NOT DETECTED NOT DETECTED Final   Candida parapsilosis NOT DETECTED NOT DETECTED Final   Candida tropicalis NOT DETECTED NOT DETECTED Final    Comment: Performed at [facility], [place] [address] [place], [place] [address]  Culture, blood (routine x 2)     Status: None (Preliminary result)   Collection Time: 07/20/17  4:00 PM  Result Value Ref Range Status   Specimen Description   Final    BLOOD RIGHT HAND Performed at [facility] [address] [place], [place] [address]    Special Requests   Final    BOTTLES DRAWN AEROBIC AND ANAEROBIC Blood Culture adequate volume Performed at [facility] [address], [place], [place] [address]    Culture   Final    NO GROWTH 2 DAYS Performed at [facility] [address], [place], [place] [address]    Report Status PENDING  Incomplete  Culture, blood (routine x 2)     Status: None (Preliminary result)   Collection Time: 07/20/17  4:00 PM  Result Value Ref Range Status   Specimen Description   Final    BLOOD LEFT ANTECUBITAL Performed at [facility] [address] [place], [place] [address]    Special Requests   Final    BOTTLES DRAWN AEROBIC ONLY Blood Culture adequate volume Performed at [facility] [address], [place], [place] [address]    Culture   Final    NO GROWTH 2 DAYS Performed at [facility] [address], [place], [place] [address]    Report Status PENDING  Incomplete      Studies: No results found.  Scheduled Meds: . budesonide  3 mg Oral Daily  . estradiol  0.5 mg Oral Daily  . iron polysaccharides  150 mg Oral Daily  . metoprolol tartrate  12.5 mg Oral BID  . mirtazapine  15 mg Oral QHS  . nystatin  5 mL Oral QID  . sodium bicarbonate  650 mg Oral BID  . vitamin B-12  100 mcg Oral Daily    Continuous Infusions: . sodium chloride 75 mL/hr at 07/21/17 1654  . cefTRIAXone (ROCEPHIN)  IV 2 g (07/22/17 1543)     Time spent: 40mins, I have personally reviewed and interpreted on  07/22/2017 daily labs, tele strips, imagings as discussed above under date review session and assessment and plans.  I reviewed all nursing notes, pharmacy notes, consultant notes,  vitals, pertinent old records  I have discussed plan of care as described above with RN , patient  on 07/22/2017   Florencia Reasons MD, PhD  [facility] Pager [phone]. If 7PM-7AM, please contact night-coverage at [email or web address], password [facility] 07/22/2017, 4:45 PM  LOS: 5 days

## 2017-07-23 ENCOUNTER — Telehealth: Payer: Self-pay

## 2017-07-23 LAB — BASIC METABOLIC PANEL
ANION GAP: 11 (ref 5–15)
BUN: 12 mg/dL (ref 6–20)
CHLORIDE: 108 mmol/L (ref 101–111)
CO2: 18 mmol/L — AB (ref 22–32)
Calcium: 6.8 mg/dL — ABNORMAL LOW (ref 8.9–10.3)
Creatinine, Ser: 0.91 mg/dL (ref 0.44–1.00)
GFR calc Af Amer: >60 mL/min (ref >60)
GFR, EST NON AFRICAN AMERICAN: 54 mL/min — AB (ref >60)
GLUCOSE: 114 mg/dL — AB (ref 65–99)
POTASSIUM: 3.1 mmol/L — AB (ref 3.5–5.1)
Sodium: 137 mmol/L (ref 135–145)

## 2017-07-23 LAB — CULTURE, BLOOD (ROUTINE X 2): CULTURE: NO GROWTH

## 2017-07-23 MED ORDER — POTASSIUM CHLORIDE CRYS ER 10 MEQ PO TBCR
40.0000 meq | EXTENDED_RELEASE_TABLET | Freq: Once | ORAL | Status: AC
  Administered 2017-07-23: 40 meq via ORAL
  Filled 2017-07-23: qty 4

## 2017-07-23 NOTE — Telephone Encounter (Signed)
Spoke with patient husband concerning upcoming appointment on 4/25. Patient in the hospital and passed a voice message for [facility] who is the provider for the patient on that day, in [place] absent. Per 4/23 walk in

## 2017-07-23 NOTE — Progress Notes (Signed)
PROGRESS NOTE  Tiffany [name]:[id number] DOB: Nov 18, 1924 DOA: 07/17/2017 PCP: [name], MD  HPI/Recap of past 24 hours:  No fever,  continue to have diarrhea, but appear start to slow down, no more blood in stool, no pain,  No n/v She feels like she can eat more Husband at bedside   Assessment/Plan: Active Problems:   Diarrhea in adult patient  Sepsis from Salmonella enteritis and bacteremia in an immunosuppressed individual : -she presented with Bloody Diarrhea, lower abdominal cramping, acute blood loss anemia, orthostatic hypotension, dizziness, fever,  lactic acidosis, wbc elevated at 15.4 on second day of admission.  -C. difficile is negative, GI PCR panel + salmonella, blood culture + salmonella -she is started on cipro/flagyl over night (4/18-4/19),  case discussed with infectious disease Dr Baxter Flattery who recommended change abx to rocephin 2g daily, for total of 14days treatment (day one start  from 4/20.)  -repeat blood culture on 4/20 no growth -continue hydration , patient continues to have diarrhea -she can go home to finish iv rocephin through port once diarrhea slows down and able to eat  ( I discussed case with ID Dr Linus Salmons who states can keep port in)   Acute blood loss anemia on anemia of chronic disease -baseline hgb around 9 -hgb on admission 8.4 then 8.2 then 8.4 then 7.4-7.7 -no more blood in stool -does not seem to need prbc transfusion  H/o large internal hemorrhoids on flex sig from 2/18  history of ulcerative proctitis (flex sig 2/18 by Dr. Wynetta Emery) and has been on Budesonide, prednisone rectal foam and mesalamine suppositories in the past without benefit per Dr. Durenda Age note in 2018.  [facility] consulted, will not plan to repeat flex sig at this moment, GI do not recommend steroid in the setting of infection.  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 Left message for Dr Grier Mitts RN.     Code Status: DNR  Family Communication: patient and husband at bedside  Disposition Plan: home with home health IV abx , in 1-2 days   Consultants:  [facility]  Oncology  ID   Procedures:  none  Antibiotics:  Cipro/flagyl on 4/18-4/19 night  Rocephin 2g daily since 4/19   Objective: BP 135/62 (BP Location: Right Arm)   Pulse 93   Temp 98.9 F (37.2 C) (Oral)   Resp 20   Ht 5' 0.98" (1.549 m)   Wt 63.1 kg (139 lb 3.2 oz)   SpO2 93%   BMI 26.32 kg/m   Intake/Output Summary (Last 24 hours) at 07/23/2017 1051 Last data filed at 07/23/2017 1000 Gross per 24 hour  Intake 360 ml  Output 1 ml  Net 359 ml   Filed Weights   07/18/17 0754 07/20/17 0300 07/23/17 0500  Weight: 55.8 kg (123 lb) 56.4 kg (124 lb 5.4 oz) 63.1 kg (139 lb 3.2 oz)    Exam: Patient is examined daily including today on 07/23/2017, exams remain the same as of yesterday except that has  changed    General:  Frail elderly,  NAD, oral thrush improving  Cardiovascular: RRR  Respiratory: CTABL  Abdomen: soft/NT/ND,  positive BS  Musculoskeletal: No Edema  Neuro: alert, oriented   Data Reviewed: Basic Metabolic Panel: Recent Labs  Lab 07/17/17 1341  07/19/17 0415 07/20/17 0444 07/21/17 0449 07/22/17 0539 07/23/17 0414  NA 135   < > 134* 139 135 137 137  K 4.5   < > 4.8 3.9 3.7 3.8 3.1*  CL 102   < > 106 111 107 109 108  CO2 23   < > 18* 19* 18* 19* 18*  GLUCOSE 107*   < > 125* 107* 103* 113* 114*   BUN 25*   < > 18 26* 22* 15 12  CREATININE 1.25*   < > 1.20* 1.39* 1.15* 0.97 0.91  CALCIUM 9.2   < > 8.3* 8.2* 7.7* 7.2* 6.8*  MG 1.8  --  1.4* 1.6* 1.9 1.7  --    < > = values in this interval not displayed.   Liver Function Tests: Recent Labs  Lab 07/18/17 0443  AST 17  ALT 10*  ALKPHOS 39  BILITOT 0.6  PROT 6.9  ALBUMIN 2.6*   No results for input(s): LIPASE, AMYLASE in the last 168 hours. No results for input(s): AMMONIA in the last 168 hours. CBC: Recent Labs  Lab 07/17/17 1341 07/18/17 0443 07/19/17 0415 07/20/17 0444 07/21/17 0449 07/22/17 0539  WBC 7.5 8.7 15.4* 13.1* 12.4* 16.6*  NEUTROABS 4.2  --  10.2*  --   --   --   HGB 8.4* 8.2* 8.4* 7.4* 7.2* 7.7*  HCT 28.0* 27.2* 27.3* 24.0* 23.2* 24.7*  MCV 83.8 84.7 84.3 83.9 82.9 82.9  PLT 347 329 315 269 275 279   Cardiac Enzymes:   No results for input(s): CKTOTAL, CKMB, CKMBINDEX, TROPONINI in the last 168 hours. BNP (last 3 results) No results for input(s): BNP in the last 8760 hours.  ProBNP (last 3 results) No results for input(s): PROBNP in the last 8760 hours.  CBG: No results for input(s): GLUCAP in the last 168 hours.  Recent Results (from the past 240 hour(s))  C difficile quick scan w PCR reflex     Status: None   Collection Time: 07/18/17  1:00 AM  Result Value Ref Range Status   C Diff antigen NEGATIVE NEGATIVE Final   C Diff toxin NEGATIVE NEGATIVE Final   C Diff interpretation No C. difficile detected.  Final    Comment: Performed at [facility], [place] [address], [place], [place] [address]  Gastrointestinal Panel by PCR , Stool     Status: Abnormal   Collection Time: 07/18/17  1:00 AM  Result Value Ref Range Status   Campylobacter species NOT DETECTED NOT DETECTED Final   Plesimonas shigelloides NOT DETECTED NOT DETECTED Final   Salmonella species DETECTED (A) NOT DETECTED Final    Comment: RESULT CALLED TO, READ BACK BY AND VERIFIED WITH: MACKENZIE HASTINGS @ 2114  ON 07/18/2017 BY CAF    Yersinia enterocolitica NOT DETECTED NOT DETECTED Final   Vibrio species NOT DETECTED NOT DETECTED Final   Vibrio cholerae NOT DETECTED NOT DETECTED Final   Enteroaggregative E coli (EAEC) NOT DETECTED NOT DETECTED Final   Enteropathogenic E coli (EPEC) NOT DETECTED NOT DETECTED Final   Enterotoxigenic E coli (ETEC) NOT DETECTED NOT DETECTED Final   Shiga like toxin producing E coli (STEC) NOT DETECTED NOT DETECTED Final   Shigella/Enteroinvasive E coli (EIEC)  NOT DETECTED NOT DETECTED Final   Cryptosporidium NOT DETECTED NOT DETECTED Final   Cyclospora cayetanensis NOT DETECTED NOT DETECTED Final   Entamoeba histolytica NOT DETECTED NOT DETECTED Final   Giardia lamblia NOT DETECTED NOT DETECTED Final   Adenovirus F40/41 NOT DETECTED NOT DETECTED Final   Astrovirus NOT DETECTED NOT DETECTED Final   Norovirus GI/GII NOT DETECTED NOT DETECTED Final   Rotavirus A NOT DETECTED NOT DETECTED Final   Sapovirus (I, II, IV, and V) NOT DETECTED NOT DETECTED Final    Comment: Performed at [facility], [place] [place], [place] [address]  Culture, blood (routine x 2)     Status: Abnormal   Collection Time: 07/18/17  9:33 AM  Result Value Ref Range Status   Specimen Description   Final    BLOOD RIGHT HAND Performed at [facility] [address], [place], [place] [address]    Special Requests   Final    BOTTLES DRAWN AEROBIC AND ANAEROBIC Blood Culture adequate volume Performed at [facility] [address] [place], [place] [address]    Culture  Setup Time   Final    GRAM NEGATIVE RODS IN BOTH AEROBIC AND ANAEROBIC BOTTLES CRITICAL RESULT CALLED TO, READ BACK BY AND VERIFIED WITH: B GREEN PHARMD 07/19/17 0223 JDW    Culture (A)  Final    SALMONELLA SPECIES SUSCEPTIBILITIES PERFORMED ON PREVIOUS CULTURE WITHIN THE LAST 5 DAYS. HEALTH DEPARTMENT NOTIFIED Performed at [facility], [place] [address],  [place], [place] [address]    Report Status 07/21/2017 FINAL  Final  Blood Culture ID Panel (Reflexed)     Status: Abnormal   Collection Time: 07/18/17  9:33 AM  Result Value Ref Range Status   Enterococcus species NOT DETECTED NOT DETECTED Final   Listeria monocytogenes NOT DETECTED NOT DETECTED Final   Staphylococcus species NOT DETECTED NOT DETECTED Final   Staphylococcus aureus NOT DETECTED NOT DETECTED Final   Streptococcus species NOT DETECTED NOT DETECTED Final   Streptococcus agalactiae NOT DETECTED NOT DETECTED Final   Streptococcus pneumoniae NOT DETECTED NOT DETECTED Final   Streptococcus pyogenes NOT DETECTED NOT DETECTED Final   Acinetobacter baumannii NOT DETECTED NOT DETECTED Final   Enterobacteriaceae species DETECTED (A) NOT DETECTED Final    Comment: Enterobacteriaceae represent a large family of gram negative bacteria, not a single organism. Refer to culture for further identification. CRITICAL RESULT CALLED TO, READ BACK BY AND VERIFIED WITH: B GREEN PHARMD 07/19/17 0223 JDW    Enterobacter cloacae complex NOT DETECTED NOT DETECTED Final   Escherichia coli NOT DETECTED NOT DETECTED Final   Klebsiella oxytoca NOT DETECTED NOT DETECTED Final   Klebsiella pneumoniae NOT DETECTED NOT DETECTED Final   Proteus species NOT DETECTED NOT DETECTED Final   Serratia marcescens NOT DETECTED NOT DETECTED Final   Carbapenem resistance NOT DETECTED NOT DETECTED Final   Haemophilus influenzae NOT DETECTED NOT DETECTED Final   Neisseria meningitidis NOT DETECTED NOT DETECTED Final   Pseudomonas aeruginosa NOT DETECTED NOT DETECTED Final   Candida albicans NOT DETECTED NOT DETECTED Final   Candida glabrata NOT DETECTED NOT DETECTED Final   Candida krusei NOT DETECTED NOT DETECTED Final   Candida parapsilosis NOT DETECTED NOT DETECTED Final   Candida tropicalis NOT DETECTED NOT DETECTED Final  Culture, blood (routine x 2)     Status: None (Preliminary result)   Collection Time: 07/18/17   9:38 AM  Result Value Ref Range Status   Specimen Description  Final    BLOOD LEFT HAND Performed at [facility], [place] [address] [place], [place] [address]    Special Requests   Final    BOTTLES DRAWN AEROBIC ONLY Blood Culture results may not be optimal due to an inadequate volume of blood received in culture bottles Performed at [place] [address] [place], [place] [address]    Culture   Final    NO GROWTH 4 DAYS Performed at [facility], [place] [address] [place], [place] [address]    Report Status PENDING  Incomplete  Culture, blood (routine x 2)     Status: Abnormal   Collection Time: 07/18/17 10:38 PM  Result Value Ref Range Status   Specimen Description   Final    BLOOD LEFT HAND Performed at [facility] [address] [place], [place] [address]    Special Requests   Final    BOTTLES DRAWN AEROBIC AND ANAEROBIC Blood Culture adequate volume Performed at [facility] [address] [place], [place] [address]    Culture  Setup Time   Final    GRAM NEGATIVE RODS IN BOTH AEROBIC AND ANAEROBIC BOTTLES CRITICAL VALUE NOTED.  VALUE IS CONSISTENT WITH PREVIOUSLY REPORTED AND CALLED VALUE.    Culture (A)  Final    SALMONELLA SPECIES SUSCEPTIBILITIES PERFORMED ON PREVIOUS CULTURE WITHIN THE LAST 5 DAYS. HEALTH DEPARTMENT NOTIFIED Performed at [facility], [place] [address] [place], [place] [address]    Report Status 07/21/2017 FINAL  Final  Culture, blood (routine x 2)     Status: Abnormal (Preliminary result)   Collection Time: 07/18/17 10:38 PM  Result Value Ref Range Status   Specimen Description   Final    BLOOD RIGHT HAND Performed at [facility] [address], [place], [place] [address]    Special Requests   Final    BOTTLES DRAWN AEROBIC AND ANAEROBIC Blood Culture adequate volume Performed at [facility] [address] [place], [place] [address]    Culture  Setup Time   Final    GRAM NEGATIVE RODS IN BOTH AEROBIC AND ANAEROBIC BOTTLES CRITICAL RESULT CALLED TO, READ BACK BY AND VERIFIED WITH: Marcy Siren PharmD 16:25 07/19/17 ([email or web address])    Culture (A)  Final    [place] NOTIFIED Referred to [facility] in [place], [place] for [place]. Performed at [facility], [place] [address] [place], [place] [address]    Report Status PENDING  Incomplete   Organism ID, Bacteria SALMONELLA SPECIES  Final      Susceptibility   Salmonella species - MIC*    AMPICILLIN <=2 SENSITIVE Sensitive     LEVOFLOXACIN <=0.12 SENSITIVE Sensitive     TRIMETH/SULFA <=20 SENSITIVE Sensitive     * SALMONELLA SPECIES  Blood Culture ID Panel (Reflexed)     Status: Abnormal   Collection Time: 07/18/17 10:38 PM  Result Value Ref Range Status   Enterococcus species NOT DETECTED NOT DETECTED Final    Comment: RESISTANT   Listeria monocytogenes NOT DETECTED NOT DETECTED Final   Staphylococcus species NOT DETECTED NOT DETECTED Final   Staphylococcus aureus NOT DETECTED NOT DETECTED Final   Streptococcus species NOT DETECTED NOT DETECTED Final   Streptococcus agalactiae NOT DETECTED NOT DETECTED Final   Streptococcus pneumoniae NOT DETECTED NOT DETECTED Final   Streptococcus pyogenes NOT DETECTED NOT DETECTED Final   Acinetobacter baumannii NOT DETECTED NOT  DETECTED Final   Enterobacteriaceae species DETECTED (A) NOT DETECTED Final    Comment: Enterobacteriaceae represent a large family of gram negative bacteria, not a single organism. Refer to culture for further identification. CRITICAL RESULT CALLED TO, READ BACK BY AND VERIFIED WITH: Marcy Siren PharmD 16:25 07/19/17 ([email or web address])    Enterobacter cloacae complex NOT DETECTED NOT DETECTED Final   Escherichia coli NOT DETECTED NOT DETECTED Final   Klebsiella oxytoca NOT DETECTED NOT DETECTED Final   Klebsiella pneumoniae NOT  DETECTED NOT DETECTED Final   Proteus species NOT DETECTED NOT DETECTED Final   Serratia marcescens NOT DETECTED NOT DETECTED Final   Carbapenem resistance NOT DETECTED NOT DETECTED Final   Haemophilus influenzae NOT DETECTED NOT DETECTED Final   Neisseria meningitidis NOT DETECTED NOT DETECTED Final   Pseudomonas aeruginosa NOT DETECTED NOT DETECTED Final   Candida albicans NOT DETECTED NOT DETECTED Final   Candida glabrata NOT DETECTED NOT DETECTED Final   Candida krusei NOT DETECTED NOT DETECTED Final   Candida parapsilosis NOT DETECTED NOT DETECTED Final   Candida tropicalis NOT DETECTED NOT DETECTED Final    Comment: Performed at [facility], [place] [address], [place], [place] [address]  Culture, blood (routine x 2)     Status: None (Preliminary result)   Collection Time: 07/20/17  4:00 PM  Result Value Ref Range Status   Specimen Description   Final    BLOOD RIGHT HAND Performed at [facility] [address] [place], [place] [address]    Special Requests   Final    BOTTLES DRAWN AEROBIC AND ANAEROBIC Blood Culture adequate volume Performed at [place] [address] [place], [place] [address]    Culture   Final    NO GROWTH 3 DAYS Performed at [facility], [place] [address], [place], [place] [address]    Report Status PENDING  Incomplete  Culture, blood (routine x 2)     Status: None (Preliminary result)   Collection Time: 07/20/17  4:00 PM  Result Value Ref Range Status   Specimen Description   Final    BLOOD LEFT ANTECUBITAL Performed at [place] [address], [place], [place] [address]    Special Requests   Final    BOTTLES DRAWN AEROBIC ONLY Blood Culture adequate volume Performed at [place] [address], [place], [place] [address]    Culture   Final    NO GROWTH 2 DAYS Performed at [facility] [address], [place],  [address]    Report  Status PENDING  Incomplete     Studies: No results found.  Scheduled Meds: . budesonide  3 mg Oral Daily  . estradiol  0.5 mg Oral Daily  . iron polysaccharides  150 mg Oral Daily  . metoprolol tartrate  12.5 mg Oral BID  . mirtazapine  15 mg Oral QHS  . nystatin  5 mL Oral QID  . potassium chloride  40 mEq Oral Once  . sodium bicarbonate  650 mg Oral BID  . vitamin B-12  100 mcg Oral Daily    Continuous Infusions: . sodium chloride 75 mL/hr at 07/23/17 0938  . cefTRIAXone (ROCEPHIN)  IV Stopped (07/22/17 1613)     Time spent: 73mins, I have personally reviewed and interpreted on  07/23/2017 daily labs, tele strips, imagings as discussed above under date review session and assessment and plans.  I reviewed all nursing notes, pharmacy notes, consultant notes,  vitals, pertinent old records  I have discussed plan of care as described above with RN , patient  And husband on 07/23/2017   Florencia Reasons MD, PhD  [facility] Pager [phone]. If 7PM-7AM, please contact night-coverage at [email or web address], password [facility] 07/23/2017, 10:51 AM  LOS: 6 days

## 2017-07-23 NOTE — Care Management Important Message (Signed)
Important Message  Patient Details IM Letter given to Rhonda/Case Manager to present to the Patient Name: Tiffany [name] MRN: [id number] Date of Birth: May 05, 1924   Medicare Important Message Given:  Yes    Kerin Salen 07/23/2017, 11:20 AM

## 2017-07-24 DIAGNOSIS — K51919 Ulcerative colitis, unspecified with unspecified complications: Secondary | ICD-10-CM

## 2017-07-24 DIAGNOSIS — K529 Noninfective gastroenteritis and colitis, unspecified: Secondary | ICD-10-CM

## 2017-07-24 DIAGNOSIS — D72829 Elevated white blood cell count, unspecified: Secondary | ICD-10-CM

## 2017-07-24 LAB — MAGNESIUM: Magnesium: 1.4 mg/dL — ABNORMAL LOW (ref 1.7–2.4)

## 2017-07-24 LAB — CBC WITH DIFFERENTIAL/PLATELET
BASOS PCT: 0 %
Basophils Absolute: 0 10*3/uL (ref 0.0–0.1)
EOS PCT: 1 %
Eosinophils Absolute: 0.1 10*3/uL (ref 0.0–0.7)
HCT: 23.6 % — ABNORMAL LOW (ref 36.0–46.0)
HEMOGLOBIN: 7.3 g/dL — AB (ref 12.0–15.0)
LYMPHS PCT: 15 %
Lymphs Abs: 2.1 10*3/uL (ref 0.7–4.0)
MCH: 25.3 pg — AB (ref 26.0–34.0)
MCHC: 30.9 g/dL (ref 30.0–36.0)
MCV: 81.7 fL (ref 78.0–100.0)
Monocytes Absolute: 1.1 10*3/uL — ABNORMAL HIGH (ref 0.1–1.0)
Monocytes Relative: 8 %
NEUTROS PCT: 76 %
Neutro Abs: 10.8 10*3/uL — ABNORMAL HIGH (ref 1.7–7.7)
Platelets: 297 10*3/uL (ref 150–400)
RBC: 2.89 MIL/uL — ABNORMAL LOW (ref 3.87–5.11)
RDW: 17.6 % — ABNORMAL HIGH (ref 11.5–15.5)
WBC: 14.1 10*3/uL — ABNORMAL HIGH (ref 4.0–10.5)

## 2017-07-24 LAB — BASIC METABOLIC PANEL
Anion gap: 9 (ref 5–15)
BUN: 11 mg/dL (ref 6–20)
CHLORIDE: 111 mmol/L (ref 101–111)
CO2: 18 mmol/L — AB (ref 22–32)
Calcium: 6.5 mg/dL — ABNORMAL LOW (ref 8.9–10.3)
Creatinine, Ser: 0.76 mg/dL (ref 0.44–1.00)
GFR calc Af Amer: >60 mL/min (ref >60)
GFR calc non Af Amer: >60 mL/min (ref >60)
GLUCOSE: 115 mg/dL — AB (ref 65–99)
POTASSIUM: 3 mmol/L — AB (ref 3.5–5.1)
Sodium: 138 mmol/L (ref 135–145)

## 2017-07-24 MED ORDER — MAGNESIUM SULFATE 2 GM/50ML IV SOLN
2.0000 g | Freq: Once | INTRAVENOUS | Status: AC
  Administered 2017-07-24: 2 g via INTRAVENOUS
  Filled 2017-07-24: qty 50

## 2017-07-24 MED ORDER — POTASSIUM CHLORIDE CRYS ER 10 MEQ PO TBCR
40.0000 meq | EXTENDED_RELEASE_TABLET | ORAL | Status: AC
  Administered 2017-07-24 (×2): 40 meq via ORAL
  Filled 2017-07-24 (×3): qty 4

## 2017-07-24 MED ORDER — ENSURE ENLIVE PO LIQD
237.0000 mL | Freq: Two times a day (BID) | ORAL | Status: DC
  Administered 2017-07-24 – 2017-07-26 (×4): 237 mL via ORAL

## 2017-07-24 NOTE — Progress Notes (Signed)
Patient ID: Tiffany [name], female   DOB: [birth year], 84 y.o.   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  She also had drop in her hemoglobin.  GI was consulted.  C. difficile was negative.  She was found to have salmonella enteritis and bacteremia.  ID was consulted.  Her antibiotics were switched to IV Rocephin.  Assessment & Plan:   Active Problems:   Diarrhea in adult patient   Sepsis from enteritis and bacteremia -Resolved.  Hemodynamically stable.  Antibiotic plan as below  Salmonella enteritis and bacteremia -Patient reports that her diarrhea is improving. -Currently on Rocephin; ID recommended 2 weeks of IV Rocephin with 07/20/2017 as a start date -Repeat blood culture on 4 2019- so far -Case was discussed with Dr. Comer/ID by prior hospitalist who had recommended that port can be left in -Discontinue IV fluids  Leukocytosis -Improving.  Antibiotic plan as above.  Repeat a.m. Labs  Acute blood loss anemia on top of anemia of chronic disease -Baseline hemoglobin around 9.  Hemoglobin is 7.3 today.  Transfuse if hemoglobin less than 7.  No more bloody bowel movements -Repeat a.m. Hemoglobin  History of ulcerative colitis -Outpatient follow-up with GI.  GI has signed off.  Continue budesonide  Hypokalemia -Replace.  Repeat a.m. labs  Hypomagnesemia -Replace.  Repeat a.m. Labs  AKA on chronic kidney disease stage III -Resolved.  Creatinine stable.  Repeat a.m. 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 Irene Limbo   DVT prophylaxis: SCDs Code  Status: Full Family Communication: None at bedside Disposition Plan: Probable home tomorrow  Consultants: GI/oncology/ID  Procedures: None  Antimicrobials:  Anti-infectives (From admission, onward)   Start     Dose/Rate Route Frequency Ordered Stop   07/19/17 1600  cefTRIAXone (ROCEPHIN) 2 g in sodium chloride 0.9 % 100 mL IVPB     2 g 200 mL/hr over 30 Minutes Intravenous Every 24 hours 07/19/17 1427 08/02/17 1559   07/18/17 2230  ciprofloxacin (CIPRO) IVPB 400 mg  Status:  Discontinued     400 mg 200 mL/hr over 60 Minutes Intravenous Daily at bedtime 07/18/17 2128 07/19/17 1423   07/18/17 2200  metroNIDAZOLE (FLAGYL) IVPB 500 mg  Status:  Discontinued     500 mg 100 mL/hr over 60 Minutes Intravenous Every 8 hours 07/18/17 2103 07/19/17 1424         Subjective: Patient seen and examined at bedside.  She feels that her diarrhea is improving but still had 3 bowel movements this morning, nonbloody.  No overnight fever, nausea or vomiting.  Still feels weak.  Objective: Vitals:   07/23/17 0500 07/23/17 1501 07/23/17 2033 07/24/17 0519  BP:  126/62 (!) 125/55 128/60  Pulse:  83 80 87  Resp:  18 16 17   Temp:  98.9 F (37.2 C) 98.1 F (36.7 C) 98.2 F (36.8 C)  TempSrc:  Oral    SpO2:  95% 94% 94%  Weight: 63.1 kg (139 lb 3.2 oz)     Height:        Intake/Output Summary (Last 24 hours) at 07/24/2017 1254 Last data filed at 07/24/2017 0828 Gross per 24 hour  Intake 900 ml  Output -  Net 900 ml   Filed Weights   07/18/17 0754 07/20/17 0300 07/23/17 0500  Weight: 55.8 kg (123 lb) 56.4 kg (124 lb 5.4 oz) 63.1 kg (139 lb 3.2 oz)    Examination:  General exam: Appears calm and comfortable  Respiratory system: Bilateral decreased breath sound at bases Cardiovascular system: S1 & S2 heard, rate controlled  gastrointestinal system: Abdomen is nondistended, soft and nontender. Normal bowel sounds heard. Extremities: No cyanosis, clubbing, edema   Data Reviewed: I have  personally reviewed following labs and imaging studies  CBC: Recent Labs  Lab 07/17/17 1341  07/19/17 0415 07/20/17 0444 07/21/17 0449 07/22/17 0539 07/24/17 0507  WBC 7.5   < > 15.4* 13.1* 12.4* 16.6* 14.1*  NEUTROABS 4.2  --  10.2*  --   --   --  10.8*  HGB 8.4*   < > 8.4* 7.4* 7.2* 7.7* 7.3*  HCT 28.0*   < > 27.3* 24.0* 23.2* 24.7* 23.6*  MCV 83.8   < > 84.3 83.9 82.9 82.9 81.7  PLT 347   < > 315 269 275 279 297   < > = values in this interval not displayed.   Basic Metabolic Panel: Recent Labs  Lab 07/19/17 0415 07/20/17 0444 07/21/17 0449 07/22/17 0539 07/23/17 0414 07/24/17 0507  NA 134* 139 135 137 137 138  K 4.8 3.9 3.7 3.8 3.1* 3.0*  CL 106 111 107 109 108 111  CO2 18* 19* 18* 19* 18* 18*  GLUCOSE 125* 107* 103* 113* 114* 115*  BUN 18 26* 22* 15 12 11   CREATININE 1.20* 1.39* 1.15* 0.97 0.91 0.76  CALCIUM 8.3* 8.2* 7.7* 7.2* 6.8* 6.5*  MG 1.4* 1.6* 1.9 1.7  --  1.4*   GFR: Estimated Creatinine Clearance: 39 mL/min (by C-G formula based on SCr of 0.76 mg/dL). Liver Function Tests: Recent Labs  Lab 07/18/17 0443  AST 17  ALT 10*  ALKPHOS 39  BILITOT 0.6  PROT 6.9  ALBUMIN 2.6*   No results for input(s): LIPASE, AMYLASE in the last 168 hours. No results for input(s): AMMONIA in the last 168 hours. Coagulation Profile: Recent Labs  Lab 07/19/17 0415  INR 1.33   Cardiac Enzymes: No results for input(s): CKTOTAL, CKMB, CKMBINDEX, TROPONINI in the last 168 hours. BNP (last 3 results) No results for input(s): PROBNP in the last 8760 hours. HbA1C: No results for input(s): HGBA1C in the last 72 hours. CBG: No results for input(s): GLUCAP in the last 168 hours. Lipid Profile: No results for input(s): CHOL, HDL, LDLCALC, TRIG, CHOLHDL, LDLDIRECT in the last 72 hours. Thyroid Function Tests: No results for input(s): TSH, T4TOTAL, FREET4, T3FREE, THYROIDAB in the last 72 hours. Anemia Panel: No results for input(s): VITAMINB12, FOLATE, FERRITIN, TIBC,  IRON, RETICCTPCT in the last 72 hours. Sepsis Labs: Recent Labs  Lab 07/19/17 0415 07/20/17 0444 07/21/17 0500 07/22/17 0539  LATICACIDVEN 2.1* 0.9 0.7 0.9    Recent Results (from the past 240 hour(s))  C difficile quick scan w PCR reflex     Status: None   Collection Time: 07/18/17  1:00 AM  Result Value Ref Range Status   C Diff antigen NEGATIVE NEGATIVE Final   C Diff toxin NEGATIVE NEGATIVE Final   C Diff interpretation No C. difficile detected.  Final    Comment: Performed at [facility], [place] [address] [place], [place] [address]  Gastrointestinal Panel by PCR , Stool     Status: Abnormal  Collection Time: 07/18/17  1:00 AM  Result Value Ref Range Status   Campylobacter species NOT DETECTED NOT DETECTED Final   Plesimonas shigelloides NOT DETECTED NOT DETECTED Final   Salmonella species DETECTED (A) NOT DETECTED Final    Comment: RESULT CALLED TO, READ BACK BY AND VERIFIED WITH: MACKENZIE HASTINGS @ 2114 ON 07/18/2017 BY CAF    Yersinia enterocolitica NOT DETECTED NOT DETECTED Final   Vibrio species NOT DETECTED NOT DETECTED Final   Vibrio cholerae NOT DETECTED NOT DETECTED Final   Enteroaggregative E coli (EAEC) NOT DETECTED NOT DETECTED Final   Enteropathogenic E coli (EPEC) NOT DETECTED NOT DETECTED Final   Enterotoxigenic E coli (ETEC) NOT DETECTED NOT DETECTED Final   Shiga like toxin producing E coli (STEC) NOT DETECTED NOT DETECTED Final   Shigella/Enteroinvasive E coli (EIEC) NOT DETECTED NOT DETECTED Final   Cryptosporidium NOT DETECTED NOT DETECTED Final   Cyclospora cayetanensis NOT DETECTED NOT DETECTED Final   Entamoeba histolytica NOT DETECTED NOT DETECTED Final   Giardia lamblia NOT DETECTED NOT DETECTED Final   Adenovirus F40/41 NOT DETECTED NOT DETECTED Final   Astrovirus NOT DETECTED NOT DETECTED Final   Norovirus GI/GII NOT DETECTED NOT DETECTED Final   Rotavirus A NOT DETECTED NOT DETECTED Final   Sapovirus (I, II, IV, and V)  NOT DETECTED NOT DETECTED Final    Comment: Performed at [facility], [place] [place], [place] [address]  Culture, blood (routine x 2)     Status: Abnormal   Collection Time: 07/18/17  9:33 AM  Result Value Ref Range Status   Specimen Description   Final    BLOOD RIGHT HAND Performed at [facility], [place] [address], [place], [place] [address]    Special Requests   Final    BOTTLES DRAWN AEROBIC AND ANAEROBIC Blood Culture adequate volume Performed at [place] [address], [place], [place] [address]    Culture  Setup Time   Final    GRAM NEGATIVE RODS IN BOTH AEROBIC AND ANAEROBIC BOTTLES CRITICAL RESULT CALLED TO, READ BACK BY AND VERIFIED WITH: B GREEN PHARMD 07/19/17 0223 JDW    Culture (A)  Final    SALMONELLA SPECIES SUSCEPTIBILITIES PERFORMED ON PREVIOUS CULTURE WITHIN THE LAST 5 DAYS. HEALTH DEPARTMENT NOTIFIED Performed at [facility], [place] [address] [place], [place] [address]    Report Status 07/21/2017 FINAL  Final  Blood Culture ID Panel (Reflexed)     Status: Abnormal   Collection Time: 07/18/17  9:33 AM  Result Value Ref Range Status   Enterococcus species NOT DETECTED NOT DETECTED Final   Listeria monocytogenes NOT DETECTED NOT DETECTED Final   Staphylococcus species NOT DETECTED NOT DETECTED Final   Staphylococcus aureus NOT DETECTED NOT DETECTED Final   Streptococcus species NOT DETECTED NOT DETECTED Final   Streptococcus agalactiae NOT DETECTED NOT DETECTED Final   Streptococcus pneumoniae NOT DETECTED NOT DETECTED Final   Streptococcus pyogenes NOT DETECTED NOT DETECTED Final   Acinetobacter baumannii NOT DETECTED NOT DETECTED Final   Enterobacteriaceae species DETECTED (A) NOT DETECTED Final    Comment: Enterobacteriaceae represent a large family of gram negative bacteria, not a single organism. Refer to culture for further identification. CRITICAL RESULT CALLED TO, READ BACK BY AND  VERIFIED WITH: B GREEN PHARMD 07/19/17 0223 JDW    Enterobacter cloacae complex NOT DETECTED NOT DETECTED Final   Escherichia coli NOT DETECTED NOT DETECTED Final   Klebsiella oxytoca NOT DETECTED NOT DETECTED  Final   Klebsiella pneumoniae NOT DETECTED NOT DETECTED Final   Proteus species NOT DETECTED NOT DETECTED Final   Serratia marcescens NOT DETECTED NOT DETECTED Final   Carbapenem resistance NOT DETECTED NOT DETECTED Final   Haemophilus influenzae NOT DETECTED NOT DETECTED Final   Neisseria meningitidis NOT DETECTED NOT DETECTED Final   Pseudomonas aeruginosa NOT DETECTED NOT DETECTED Final   Candida albicans NOT DETECTED NOT DETECTED Final   Candida glabrata NOT DETECTED NOT DETECTED Final   Candida krusei NOT DETECTED NOT DETECTED Final   Candida parapsilosis NOT DETECTED NOT DETECTED Final   Candida tropicalis NOT DETECTED NOT DETECTED Final  Culture, blood (routine x 2)     Status: None   Collection Time: 07/18/17  9:38 AM  Result Value Ref Range Status   Specimen Description   Final    BLOOD LEFT HAND Performed at [place] [address] [place], [place] [address]    Special Requests   Final    BOTTLES DRAWN AEROBIC ONLY Blood Culture results may not be optimal due to an inadequate volume of blood received in culture bottles Performed at [place] [address] [place], [place] [address]    Culture   Final    NO GROWTH 5 DAYS Performed at [facility], [place] [address] [place], [place] [address]    Report Status 07/23/2017 FINAL  Final  Culture, blood (routine x 2)     Status: Abnormal   Collection Time: 07/18/17 10:38 PM  Result Value Ref Range Status   Specimen Description   Final    BLOOD LEFT HAND Performed at [place] [address] [place], [place] [address]    Special Requests   Final    BOTTLES DRAWN AEROBIC AND ANAEROBIC Blood Culture adequate volume Performed at [place] [address] [place], [place] [address]    Culture  Setup Time   Final    GRAM NEGATIVE RODS IN BOTH AEROBIC AND ANAEROBIC BOTTLES CRITICAL VALUE NOTED.  VALUE IS CONSISTENT WITH PREVIOUSLY REPORTED AND CALLED VALUE.    Culture (A)  Final    SALMONELLA SPECIES SUSCEPTIBILITIES PERFORMED ON PREVIOUS CULTURE WITHIN THE LAST 5 DAYS. HEALTH DEPARTMENT NOTIFIED Performed at [facility], [place] [address], [place], [place] [address]    Report Status 07/21/2017 FINAL  Final  Culture, blood (routine x 2)     Status: Abnormal (Preliminary result)   Collection Time: 07/18/17 10:38 PM  Result Value Ref Range Status   Specimen Description   Final    BLOOD RIGHT HAND Performed at [place] [address], [place], [place] [address]    Special Requests   Final    BOTTLES DRAWN AEROBIC AND ANAEROBIC Blood Culture adequate volume Performed at [place] [address], [place], [place] [address]    Culture  Setup Time   Final    GRAM NEGATIVE RODS IN BOTH AEROBIC AND ANAEROBIC BOTTLES CRITICAL RESULT CALLED TO, READ BACK BY AND VERIFIED WITH: Marcy Siren PharmD 16:25 07/19/17 ([email or web address])    Culture (A)  Final    [place] NOTIFIED Referred to [facility] in [place], [place] for [place]. Performed at [facility], [place] [address], [place], [place] [address]    Report Status PENDING  Incomplete   Organism ID, Bacteria SALMONELLA SPECIES  Final      Susceptibility   Salmonella  species - MIC*    AMPICILLIN <=2 SENSITIVE Sensitive     LEVOFLOXACIN <=0.12 SENSITIVE Sensitive     TRIMETH/SULFA <=20 SENSITIVE Sensitive     * SALMONELLA SPECIES  Blood Culture ID Panel (Reflexed)     Status: Abnormal   Collection Time: 07/18/17 10:38 PM  Result Value Ref Range Status   Enterococcus species NOT DETECTED NOT DETECTED Final    Comment: RESISTANT   Listeria  monocytogenes NOT DETECTED NOT DETECTED Final   Staphylococcus species NOT DETECTED NOT DETECTED Final   Staphylococcus aureus NOT DETECTED NOT DETECTED Final   Streptococcus species NOT DETECTED NOT DETECTED Final   Streptococcus agalactiae NOT DETECTED NOT DETECTED Final   Streptococcus pneumoniae NOT DETECTED NOT DETECTED Final   Streptococcus pyogenes NOT DETECTED NOT DETECTED Final   Acinetobacter baumannii NOT DETECTED NOT DETECTED Final   Enterobacteriaceae species DETECTED (A) NOT DETECTED Final    Comment: Enterobacteriaceae represent a large family of gram negative bacteria, not a single organism. Refer to culture for further identification. CRITICAL RESULT CALLED TO, READ BACK BY AND VERIFIED WITH: Marcy Siren PharmD 16:25 07/19/17 ([email or web address])    Enterobacter cloacae complex NOT DETECTED NOT DETECTED Final   Escherichia coli NOT DETECTED NOT DETECTED Final   Klebsiella oxytoca NOT DETECTED NOT DETECTED Final   Klebsiella pneumoniae NOT DETECTED NOT DETECTED Final   Proteus species NOT DETECTED NOT DETECTED Final   Serratia marcescens NOT DETECTED NOT DETECTED Final   Carbapenem resistance NOT DETECTED NOT DETECTED Final   Haemophilus influenzae NOT DETECTED NOT DETECTED Final   Neisseria meningitidis NOT DETECTED NOT DETECTED Final   Pseudomonas aeruginosa NOT DETECTED NOT DETECTED Final   Candida albicans NOT DETECTED NOT DETECTED Final   Candida glabrata NOT DETECTED NOT DETECTED Final   Candida krusei NOT DETECTED NOT DETECTED Final   Candida parapsilosis NOT DETECTED NOT DETECTED Final   Candida tropicalis NOT DETECTED NOT DETECTED Final    Comment: Performed at [facility], [place] [address], [place], [place] [address]  Culture, blood (routine x 2)     Status: None (Preliminary result)   Collection Time: 07/20/17  4:00 PM  Result Value Ref Range Status   Specimen Description   Final    BLOOD RIGHT HAND Performed at [place] [address] [place], [place] [address]    Special Requests   Final    BOTTLES DRAWN AEROBIC AND ANAEROBIC Blood Culture adequate volume Performed at [place] [address], [place], [place] [address]    Culture   Final    NO GROWTH 3 DAYS Performed at [facility], [place] [address], [place], [place] [address]    Report Status PENDING  Incomplete  Culture, blood (routine x 2)     Status: None (Preliminary result)   Collection Time: 07/20/17  4:00 PM  Result Value Ref Range Status   Specimen Description   Final    BLOOD LEFT ANTECUBITAL Performed at [place] [address], [place], [place] [address]    Special Requests   Final    BOTTLES DRAWN AEROBIC ONLY Blood Culture adequate volume Performed at [place] [address], [place], [place] [address]    Culture   Final    NO GROWTH 3 DAYS Performed at [facility], [place] [address], [place], [place] [address]    Report Status PENDING  Incomplete         Radiology  Studies: No results found.      Scheduled Meds: . budesonide  3 mg Oral Daily  . estradiol  0.5 mg Oral Daily  . feeding supplement (ENSURE ENLIVE)  237 mL Oral BID BM  . iron polysaccharides  150 mg Oral Daily  . metoprolol tartrate  12.5 mg Oral BID  . mirtazapine  15 mg Oral QHS  . nystatin  5 mL Oral QID  . sodium bicarbonate  650 mg Oral BID  . vitamin B-12  100 mcg Oral Daily   Continuous Infusions: . sodium chloride 75 mL/hr at 07/23/17 0938  . cefTRIAXone (ROCEPHIN)  IV Stopped (07/23/17 1627)     LOS: 7 days        Aline August, MD [facility] Pager [phone]  If 7PM-7AM, please contact night-coverage [email or web address] Password [facility] 07/24/2017, 12:54 PM

## 2017-07-24 NOTE — Evaluation (Signed)
Physical Therapy Evaluation Patient Details Name: Tiffany [name] MRN: [id number] DOB: Nov 27, 1924 Today's Date: 07/24/2017   History of Present Illness  84yo female presenting to the ED with generalized weakness, diarrhea, poor oral intake, and positive for orthostatic BP in the ED. Negative for C-diff, found to be in sepsis secondary to salmonella infection. PMH OA, hx metastatic CA, DDD, gout, HTN, occipital neuralgia, hx neck surgery   Clinical Impression   Patient received in bed, very pleasant and willing to participate with PT today. She is able to perform supine to sit with extended time and S, able to complete functional transfers and gait with RW with Min guard for safety, intermittent verbal cues for sequencing and safe use of device. She does require MinA for transition back to supine however, and was left in bed with all needs met, bed alarm activated. She will continue to benefit from skilled PT services in the acute setting as well as skilled HHPT services to further address functional deficits and reduce fall risk moving forward.     Follow Up Recommendations Home health PT    Equipment Recommendations  Rolling walker with 5" wheels;3in1 (PT);Other (comment)(shower seat )    Recommendations for Other Services       Precautions / Restrictions Precautions Precautions: Fall Restrictions Weight Bearing Restrictions: No      Mobility  Bed Mobility Overal bed mobility: Needs Assistance Bed Mobility: Supine to Sit;Sit to Supine     Supine to sit: Supervision Sit to supine: Min assist   General bed mobility comments: S and extended time/increased effort for supine to sit; requires MinA for LE management when getting back into bed   Transfers Overall transfer level: Needs assistance Equipment used: Rolling walker (2 wheeled) Transfers: Sit to/from Stand Sit to Stand: Min guard         General transfer comment: Min guard for safety, VC for hand placement and  sequencing   Ambulation/Gait Ambulation/Gait assistance: Min guard Ambulation Distance (Feet): 30 Feet(60f, 120fin room due to enteric precautions ) Assistive device: Rolling walker (2 wheeled) Gait Pattern/deviations: Step-through pattern;Decreased step length - right;Decreased step length - left;Decreased stride length;Trunk flexed     General Gait Details: very slow gait speed but steady at self-selected pace, intermittent cues for safety with RW   Stairs            Wheelchair Mobility    Modified Rankin (Stroke Patients Only)       Balance Overall balance assessment: Mild deficits observed, not formally tested                                           Pertinent Vitals/Pain Pain Assessment: No/denies pain    Home Living Family/patient expects to be discharged to:: Private residence Living Arrangements: Spouse/significant other Available Help at Discharge: Family;Available 24 hours/day Type of Home: House Home Access: Level entry     Home Layout: Two level Home Equipment: Cane - single point;Walker - 2 wheels Additional Comments: has a walker but it is very old     Prior Function Level of Independence: Independent with assistive device(s)         Comments: reports at baseline she walks without her cane in the home, usually uses cane only when going out of the house      Hand Dominance        Extremity/Trunk Assessment  Upper Extremity Assessment Upper Extremity Assessment: Generalized weakness    Lower Extremity Assessment Lower Extremity Assessment: Generalized weakness    Cervical / Trunk Assessment Cervical / Trunk Assessment: Kyphotic  Communication   Communication: No difficulties  Cognition Arousal/Alertness: Awake/alert Behavior During Therapy: WFL for tasks assessed/performed Overall Cognitive Status: Within Functional Limits for tasks assessed                                        General  Comments      Exercises     Assessment/Plan    PT Assessment Patient needs continued PT services  PT Problem List Decreased strength;Decreased mobility;Decreased safety awareness;Decreased coordination       PT Treatment Interventions DME instruction;Therapeutic activities;Gait training;Therapeutic exercise;Patient/family education;Stair training;Balance training;Functional mobility training;Neuromuscular re-education    PT Goals (Current goals can be found in the Care Plan section)  Acute Rehab PT Goals Patient Stated Goal: to go home, rest a little better  PT Goal Formulation: With patient Time For Goal Achievement: 08/07/17 Potential to Achieve Goals: Good    Frequency Min 3X/week   Barriers to discharge        Co-evaluation               AM-PAC PT "6 Clicks" Daily Activity  Outcome Measure Difficulty turning over in bed (including adjusting bedclothes, sheets and blankets)?: Unable Difficulty moving from lying on back to sitting on the side of the bed? : Unable Difficulty sitting down on and standing up from a chair with arms (e.g., wheelchair, bedside commode, etc,.)?: Unable Help needed moving to and from a bed to chair (including a wheelchair)?: A Little Help needed walking in hospital room?: A Little Help needed climbing 3-5 steps with a railing? : A Lot 6 Click Score: 11    End of Session   Activity Tolerance: Patient tolerated treatment well Patient left: in bed;with bed alarm set;with call bell/phone within reach   PT Visit Diagnosis: Muscle weakness (generalized) (M62.81);Difficulty in walking, not elsewhere classified (R26.2)    Time: 7494-4967 PT Time Calculation (min) (ACUTE ONLY): 27 min   Charges:   PT Evaluation $PT Eval Low Complexity: 1 Low PT Treatments $Gait Training: 8-22 mins   PT G Codes:        Deniece Ree PT, DPT, CBIS  Supplemental Physical Therapist [place]   Pager [phone]

## 2017-07-24 NOTE — Progress Notes (Signed)
PHARMACY CONSULT NOTE FOR:  OUTPATIENT  PARENTERAL ANTIBIOTIC THERAPY (OPAT)  Indication: Bacteremia Regimen:  ceftriaxone 2 gr IV q24h End date: 08/02/17  IV antibiotic discharge orders are pended. To discharging provider:  please sign these orders via discharge navigator,  Select New Orders & click on the button choice - Manage This Unsigned Work.     Thank you for allowing pharmacy to be a part of this patient's care.   Royetta Asal, PharmD, BCPS Pager [phone] 07/24/2017 10:26 AM

## 2017-07-25 ENCOUNTER — Ambulatory Visit: Payer: Medicare Other

## 2017-07-25 ENCOUNTER — Other Ambulatory Visit: Payer: Medicare Other

## 2017-07-25 ENCOUNTER — Ambulatory Visit: Payer: Medicare Other | Admitting: Adult Health

## 2017-07-25 LAB — CULTURE, BLOOD (ROUTINE X 2)
CULTURE: NO GROWTH
Culture: NO GROWTH
SPECIAL REQUESTS: ADEQUATE
Special Requests: ADEQUATE

## 2017-07-25 LAB — BASIC METABOLIC PANEL
ANION GAP: 9 (ref 5–15)
BUN: 10 mg/dL (ref 6–20)
CALCIUM: 6.6 mg/dL — AB (ref 8.9–10.3)
CO2: 19 mmol/L — AB (ref 22–32)
CREATININE: 0.87 mg/dL (ref 0.44–1.00)
Chloride: 110 mmol/L (ref 101–111)
GFR calc Af Amer: >60 mL/min (ref >60)
GFR, EST NON AFRICAN AMERICAN: 57 mL/min — AB (ref >60)
GLUCOSE: 124 mg/dL — AB (ref 65–99)
Potassium: 3.4 mmol/L — ABNORMAL LOW (ref 3.5–5.1)
Sodium: 138 mmol/L (ref 135–145)

## 2017-07-25 LAB — CBC WITH DIFFERENTIAL/PLATELET
Basophils Absolute: 0 10*3/uL (ref 0.0–0.1)
Basophils Relative: 0 %
EOS PCT: 1 %
Eosinophils Absolute: 0.1 10*3/uL (ref 0.0–0.7)
HEMATOCRIT: 21.4 % — AB (ref 36.0–46.0)
Hemoglobin: 6.8 g/dL — CL (ref 12.0–15.0)
LYMPHS ABS: 1.2 10*3/uL (ref 0.7–4.0)
Lymphocytes Relative: 9 %
MCH: 26.1 pg (ref 26.0–34.0)
MCHC: 31.8 g/dL (ref 30.0–36.0)
MCV: 82 fL (ref 78.0–100.0)
MONO ABS: 2.1 10*3/uL — AB (ref 0.1–1.0)
Monocytes Relative: 16 %
NEUTROS ABS: 9.7 10*3/uL — AB (ref 1.7–7.7)
Neutrophils Relative %: 74 %
PLATELETS: 288 10*3/uL (ref 150–400)
RBC: 2.61 MIL/uL — AB (ref 3.87–5.11)
RDW: 17.9 % — AB (ref 11.5–15.5)
WBC: 13.1 10*3/uL — AB (ref 4.0–10.5)

## 2017-07-25 LAB — PREPARE RBC (CROSSMATCH)

## 2017-07-25 LAB — MAGNESIUM: Magnesium: 1.9 mg/dL (ref 1.7–2.4)

## 2017-07-25 MED ORDER — SODIUM CHLORIDE 0.9 % IV SOLN: Freq: Once | INTRAVENOUS | Status: DC

## 2017-07-25 MED ORDER — POTASSIUM CHLORIDE CRYS ER 10 MEQ PO TBCR
60.0000 meq | EXTENDED_RELEASE_TABLET | Freq: Once | ORAL | Status: AC
  Administered 2017-07-25: 60 meq via ORAL
  Filled 2017-07-25: qty 6

## 2017-07-25 NOTE — Progress Notes (Signed)
Paged Tylene Fantasia, NP floor coverage after receiving phone call from lab reporting Hgb 6.8 this AM.

## 2017-07-25 NOTE — Progress Notes (Signed)
Nutrition Follow-up  DOCUMENTATION CODES:   Not applicable  INTERVENTION:    Ensure Enlive po BID, each supplement provides 350 kcal and 20 grams of protein  Magic cup TID with meals, each supplement provides 290 kcal and 9 grams of protein   NUTRITION DIAGNOSIS:   Increased nutrient needs related to cancer and cancer related treatments, catabolic illness as evidenced by estimated needs.  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 Admitted for dehydration and acute blood loss anemia.   Diet advanced to soft 4/21. Pt reports her appetite is progressing. Meal completions charted as 50-75% for her last 6 meals. Pt had eggs, grits, and orange juice for breakfast without any complication. Pt reports after drinking her Ensure this morning, she had an immediate episode of diarrhea. RD to monitor symptoms. Will add magic cup TID to maximize protein and calories. Weight noted to increase by 15 lb since last RD visit on 4/16 (124 lb to 139 lb). Will continue to monitor trends.   Deferred Nutrition-Focused physical, suspect any depletions found are related to advanced age.   Medications reviewed and include: Remeron, Vit B12, IV abx Labs reviewed: K 3.4 (L) Calcium 6.6 (L)  Diet Order:  DIET SOFT Room service appropriate? Yes; Fluid consistency: Thin  EDUCATION NEEDS:   Education needs have been addressed  Skin:  Skin Assessment: Reviewed RN Assessment  Last BM:  07/25/17  Height:   Ht Readings from Last 1 Encounters:  07/18/17 5' 0.98"  (1.549 m)    Weight:   Wt Readings from Last 1 Encounters:  07/23/17 139 lb 3.2 oz (63.1 kg)    Ideal Body Weight:  47.7 kg  BMI:  Body mass index is 26.32 kg/m.  Estimated Nutritional Needs:   Kcal:  1600-1800 kcal  Protein:  80-90 g  Fluid:  >1.6 L/day    Mariana Single RD, LDN Clinical Nutrition Pager # [phone]

## 2017-07-25 NOTE — Progress Notes (Signed)
Patient ID: Tiffany [name], female   DOB: 1925-02-24, 84 y.o.   MRN: [id number]  PROGRESS NOTE    Tiffany [name]  XBD:[id number] DOB: 1924/05/15 DOA: 07/17/2017 PCP: [name], MD   Brief Narrative:  84 year old female with history of transitional cell carcinoma of the renal pelvis, liver metastasis, hypertension presented on 07/17/2017 for generalized weakness and diarrhea.  She also had drop in her hemoglobin.  GI was consulted.  C. difficile was negative.  She was found to have salmonella enteritis and bacteremia.  ID was consulted.  Her antibiotics were switched to IV Rocephin.  Assessment & Plan:   Active Problems:   Diarrhea in adult patient   Sepsis from enteritis and bacteremia -Resolved.  Hemodynamically stable.  Antibiotic plan as below  Salmonella enteritis and bacteremia -Patient reports that her diarrhea is improving. -Currently on Rocephin; ID recommended 2 weeks of IV Rocephin with 07/20/2017 as a start date -Repeat blood culture on 07/20/17 negative so far -Case was discussed with Dr. Comer/ID by prior hospitalist who had recommended that port can be left in   Leukocytosis -Improving.  Antibiotic plan as above.  Repeat a.m. Labs  Acute blood loss anemia on top of anemia of chronic disease -Baseline hemoglobin around 9.  Hemoglobin is 6.8 today.  Transfuse 1 unit of packed red cells today.  No more bloody bowel movements -Repeat a.m. Hemoglobin  History of ulcerative colitis -Outpatient follow-up with GI.  GI has signed off.  Continue budesonide  Hypokalemia -Replace.  Repeat a.m. labs  Hypomagnesemia -Improved.  Repeat a.m. Labs  AKA on chronic kidney disease stage III -Resolved.  Creatinine stable.  Repeat a.m. 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 Irene Limbo  Generalized deconditioning -Patient  feels weak and is scared to go home.  Will consult social worker regarding probable nursing home placement.   DVT prophylaxis: SCDs Code Status: Full Family Communication: Spoke to husband at bedside Disposition Plan: Probable home versus nursing home once bed is available   Consultants: GI/oncology/ID  Procedures: None  Antimicrobials:  Anti-infectives (From admission, onward)   Start     Dose/Rate Route Frequency Ordered Stop   07/19/17 1600  cefTRIAXone (ROCEPHIN) 2 g in sodium chloride 0.9 % 100 mL IVPB     2 g 200 mL/hr over 30 Minutes Intravenous Every 24 hours 07/19/17 1427 08/02/17 1559   07/18/17 2230  ciprofloxacin (CIPRO) IVPB 400 mg  Status:  Discontinued     400 mg 200 mL/hr over 60 Minutes Intravenous Daily at bedtime 07/18/17 2128 07/19/17 1423   07/18/17 2200  metroNIDAZOLE (FLAGYL) IVPB 500 mg  Status:  Discontinued     500 mg 100 mL/hr over 60 Minutes Intravenous Every 8 hours 07/18/17 2103 07/19/17 1424          Subjective: Patient seen and examined at bedside.  She still feels very weak and tired.  No overnight fever or vomiting. Objective: Vitals:   07/24/17 0519 07/24/17 1414 07/24/17 2035 07/25/17 0459  BP: 128/60 132/65 132/60 140/65  Pulse: 87 92 89 91  Resp: 17 18 (!) 22 (!) 24  Temp: 98.2 F (36.8 C) 98.7 F (37.1 C) 99.6 F (37.6 C) 98.8 F (37.1 C)  TempSrc:  Oral Oral Oral  SpO2: 94% 95% 93% 94%  Weight:      Height:        Intake/Output Summary (Last  24 hours) at 07/25/2017 1016 Last data filed at 07/25/2017 0600 Gross per 24 hour  Intake 1260 ml  Output -  Net 1260 ml   Filed Weights   07/18/17 0754 07/20/17 0300 07/23/17 0500  Weight: 55.8 kg (123 lb) 56.4 kg (124 lb 5.4 oz) 63.1 kg (139 lb 3.2 oz)    Examination:  General exam: Appears calm and comfortable.  No distress.  Elderly female sitting on bed Respiratory system: Bilateral decreased breath sounds at bases Cardiovascular system: Rate controlled, S1-S2  positive gastrointestinal system: Abdomen is nondistended, soft and nontender. Normal bowel sounds heard. Extremities: No cyanosis, clubbing, edema   Data Reviewed: I have personally reviewed following labs and imaging studies  CBC: Recent Labs  Lab 07/19/17 0415 07/20/17 0444 07/21/17 0449 07/22/17 0539 07/24/17 0507 07/25/17 0359  WBC 15.4* 13.1* 12.4* 16.6* 14.1* 13.1*  NEUTROABS 10.2*  --   --   --  10.8* 9.7*  HGB 8.4* 7.4* 7.2* 7.7* 7.3* 6.8*  HCT 27.3* 24.0* 23.2* 24.7* 23.6* 21.4*  MCV 84.3 83.9 82.9 82.9 81.7 82.0  PLT 315 269 275 279 297 102   Basic Metabolic Panel: Recent Labs  Lab 07/20/17 0444 07/21/17 0449 07/22/17 0539 07/23/17 0414 07/24/17 0507 07/25/17 0359  NA 139 135 137 137 138 138  K 3.9 3.7 3.8 3.1* 3.0* 3.4*  CL 111 107 109 108 111 110  CO2 19* 18* 19* 18* 18* 19*  GLUCOSE 107* 103* 113* 114* 115* 124*  BUN 26* 22* 15 12 11 10   CREATININE 1.39* 1.15* 0.97 0.91 0.76 0.87  CALCIUM 8.2* 7.7* 7.2* 6.8* 6.5* 6.6*  MG 1.6* 1.9 1.7  --  1.4* 1.9   GFR: Estimated Creatinine Clearance: 35.8 mL/min (by C-G formula based on SCr of 0.87 mg/dL). Liver Function Tests: No results for input(s): AST, ALT, ALKPHOS, BILITOT, PROT, ALBUMIN in the last 168 hours. No results for input(s): LIPASE, AMYLASE in the last 168 hours. No results for input(s): AMMONIA in the last 168 hours. Coagulation Profile: Recent Labs  Lab 07/19/17 0415  INR 1.33   Cardiac Enzymes: No results for input(s): CKTOTAL, CKMB, CKMBINDEX, TROPONINI in the last 168 hours. BNP (last 3 results) No results for input(s): PROBNP in the last 8760 hours. HbA1C: No results for input(s): HGBA1C in the last 72 hours. CBG: No results for input(s): GLUCAP in the last 168 hours. Lipid Profile: No results for input(s): CHOL, HDL, LDLCALC, TRIG, CHOLHDL, LDLDIRECT in the last 72 hours. Thyroid Function Tests: No results for input(s): TSH, T4TOTAL, FREET4, T3FREE, THYROIDAB in the last 72  hours. Anemia Panel: No results for input(s): VITAMINB12, FOLATE, FERRITIN, TIBC, IRON, RETICCTPCT in the last 72 hours. Sepsis Labs: Recent Labs  Lab 07/19/17 0415 07/20/17 0444 07/21/17 0500 07/22/17 0539  LATICACIDVEN 2.1* 0.9 0.7 0.9    Recent Results (from the past 240 hour(s))  C difficile quick scan w PCR reflex     Status: None   Collection Time: 07/18/17  1:00 AM  Result Value Ref Range Status   C Diff antigen NEGATIVE NEGATIVE Final   C Diff toxin NEGATIVE NEGATIVE Final   C Diff interpretation No C. difficile detected.  Final    Comment: Performed at [facility], [place] [address] [place], [place] [address]  Gastrointestinal Panel by PCR , Stool     Status: Abnormal   Collection Time: 07/18/17  1:00 AM  Result Value Ref Range Status   Campylobacter species NOT DETECTED NOT DETECTED Final  Plesimonas shigelloides NOT DETECTED NOT DETECTED Final   Salmonella species DETECTED (A) NOT DETECTED Final    Comment: RESULT CALLED TO, READ BACK BY AND VERIFIED WITH: MACKENZIE HASTINGS @ 2114 ON 07/18/2017 BY CAF    Yersinia enterocolitica NOT DETECTED NOT DETECTED Final   Vibrio species NOT DETECTED NOT DETECTED Final   Vibrio cholerae NOT DETECTED NOT DETECTED Final   Enteroaggregative E coli (EAEC) NOT DETECTED NOT DETECTED Final   Enteropathogenic E coli (EPEC) NOT DETECTED NOT DETECTED Final   Enterotoxigenic E coli (ETEC) NOT DETECTED NOT DETECTED Final   Shiga like toxin producing E coli (STEC) NOT DETECTED NOT DETECTED Final   Shigella/Enteroinvasive E coli (EIEC) NOT DETECTED NOT DETECTED Final   Cryptosporidium NOT DETECTED NOT DETECTED Final   Cyclospora cayetanensis NOT DETECTED NOT DETECTED Final   Entamoeba histolytica NOT DETECTED NOT DETECTED Final   Giardia lamblia NOT DETECTED NOT DETECTED Final   Adenovirus F40/41 NOT DETECTED NOT DETECTED Final   Astrovirus NOT DETECTED NOT DETECTED Final   Norovirus GI/GII NOT DETECTED NOT DETECTED  Final   Rotavirus A NOT DETECTED NOT DETECTED Final   Sapovirus (I, II, IV, and V) NOT DETECTED NOT DETECTED Final    Comment: Performed at [facility], [address] [place], [place] [address]  Culture, blood (routine x 2)     Status: Abnormal   Collection Time: 07/18/17  9:33 AM  Result Value Ref Range Status   Specimen Description   Final    BLOOD RIGHT HAND Performed at [facility], [place] [address], [place], [place] [address]    Special Requests   Final    BOTTLES DRAWN AEROBIC AND ANAEROBIC Blood Culture adequate volume Performed at [place] [address] [place], [place] [address]    Culture  Setup Time   Final    GRAM NEGATIVE RODS IN BOTH AEROBIC AND ANAEROBIC BOTTLES CRITICAL RESULT CALLED TO, READ BACK BY AND VERIFIED WITH: B GREEN PHARMD 07/19/17 0223 JDW    Culture (A)  Final    SALMONELLA SPECIES SUSCEPTIBILITIES PERFORMED ON PREVIOUS CULTURE WITHIN THE LAST 5 DAYS. HEALTH DEPARTMENT NOTIFIED Performed at [facility], [place] [address], [place], [place] [address]    Report Status 07/21/2017 FINAL  Final  Blood Culture ID Panel (Reflexed)     Status: Abnormal   Collection Time: 07/18/17  9:33 AM  Result Value Ref Range Status   Enterococcus species NOT DETECTED NOT DETECTED Final   Listeria monocytogenes NOT DETECTED NOT DETECTED Final   Staphylococcus species NOT DETECTED NOT DETECTED Final   Staphylococcus aureus NOT DETECTED NOT DETECTED Final   Streptococcus species NOT DETECTED NOT DETECTED Final   Streptococcus agalactiae NOT DETECTED NOT DETECTED Final   Streptococcus pneumoniae NOT DETECTED NOT DETECTED Final   Streptococcus pyogenes NOT DETECTED NOT DETECTED Final   Acinetobacter baumannii NOT DETECTED NOT DETECTED Final   Enterobacteriaceae species DETECTED (A) NOT DETECTED Final    Comment: Enterobacteriaceae represent a large family of gram negative bacteria, not a single organism. Refer to  culture for further identification. CRITICAL RESULT CALLED TO, READ BACK BY AND VERIFIED WITH: B GREEN PHARMD 07/19/17 0223 JDW    Enterobacter cloacae complex NOT DETECTED NOT DETECTED Final   Escherichia coli NOT DETECTED NOT DETECTED Final   Klebsiella oxytoca NOT DETECTED NOT DETECTED Final   Klebsiella pneumoniae NOT DETECTED NOT DETECTED Final   Proteus species NOT DETECTED NOT DETECTED Final   Serratia marcescens  NOT DETECTED NOT DETECTED Final   Carbapenem resistance NOT DETECTED NOT DETECTED Final   Haemophilus influenzae NOT DETECTED NOT DETECTED Final   Neisseria meningitidis NOT DETECTED NOT DETECTED Final   Pseudomonas aeruginosa NOT DETECTED NOT DETECTED Final   Candida albicans NOT DETECTED NOT DETECTED Final   Candida glabrata NOT DETECTED NOT DETECTED Final   Candida krusei NOT DETECTED NOT DETECTED Final   Candida parapsilosis NOT DETECTED NOT DETECTED Final   Candida tropicalis NOT DETECTED NOT DETECTED Final  Culture, blood (routine x 2)     Status: None   Collection Time: 07/18/17  9:38 AM  Result Value Ref Range Status   Specimen Description   Final    BLOOD LEFT HAND Performed at [facility], [place] [address] [place], [place] [address]    Special Requests   Final    BOTTLES DRAWN AEROBIC ONLY Blood Culture results may not be optimal due to an inadequate volume of blood received in culture bottles Performed at [place] [address] [place], [place] [address]    Culture   Final    NO GROWTH 5 DAYS Performed at [facility], [place] [address], [place], [place] [address]    Report Status 07/23/2017 FINAL  Final  Culture, blood (routine x 2)     Status: Abnormal   Collection Time: 07/18/17 10:38 PM  Result Value Ref Range Status   Specimen Description   Final    BLOOD LEFT HAND Performed at [place] [address], [place], [place] [address]    Special Requests   Final    BOTTLES DRAWN  AEROBIC AND ANAEROBIC Blood Culture adequate volume Performed at [place] [address], [place], [place] [address]    Culture  Setup Time   Final    GRAM NEGATIVE RODS IN BOTH AEROBIC AND ANAEROBIC BOTTLES CRITICAL VALUE NOTED.  VALUE IS CONSISTENT WITH PREVIOUSLY REPORTED AND CALLED VALUE.    Culture (A)  Final    SALMONELLA SPECIES SUSCEPTIBILITIES PERFORMED ON PREVIOUS CULTURE WITHIN THE LAST 5 DAYS. HEALTH DEPARTMENT NOTIFIED Performed at [facility], [place] [address] [place], [place] [address]    Report Status 07/21/2017 FINAL  Final  Culture, blood (routine x 2)     Status: Abnormal (Preliminary result)   Collection Time: 07/18/17 10:38 PM  Result Value Ref Range Status   Specimen Description   Final    BLOOD RIGHT HAND Performed at [place] [address] [place], [place] [address]    Special Requests   Final    BOTTLES DRAWN AEROBIC AND ANAEROBIC Blood Culture adequate volume Performed at [place] [address] [place], [place] [address]    Culture  Setup Time   Final    GRAM NEGATIVE RODS IN BOTH AEROBIC AND ANAEROBIC BOTTLES CRITICAL RESULT CALLED TO, READ BACK BY AND VERIFIED WITH: Marcy Siren PharmD 16:25 07/19/17 ([email or web address])    Culture (A)  Final    [place] NOTIFIED Referred to [facility] in [place], [place] for [place]. Performed at [facility], [place] [address], [place], [place] [address]    Report Status PENDING  Incomplete   Organism ID, Bacteria SALMONELLA SPECIES  Final      Susceptibility   Salmonella species - MIC*    AMPICILLIN <=2 SENSITIVE Sensitive     LEVOFLOXACIN <=0.12 SENSITIVE Sensitive     TRIMETH/SULFA <=  20 SENSITIVE Sensitive     * SALMONELLA SPECIES  Blood Culture ID Panel (Reflexed)     Status: Abnormal   Collection Time: 07/18/17 10:38 PM  Result Value Ref Range Status   Enterococcus species  NOT DETECTED NOT DETECTED Final    Comment: RESISTANT   Listeria monocytogenes NOT DETECTED NOT DETECTED Final   Staphylococcus species NOT DETECTED NOT DETECTED Final   Staphylococcus aureus NOT DETECTED NOT DETECTED Final   Streptococcus species NOT DETECTED NOT DETECTED Final   Streptococcus agalactiae NOT DETECTED NOT DETECTED Final   Streptococcus pneumoniae NOT DETECTED NOT DETECTED Final   Streptococcus pyogenes NOT DETECTED NOT DETECTED Final   Acinetobacter baumannii NOT DETECTED NOT DETECTED Final   Enterobacteriaceae species DETECTED (A) NOT DETECTED Final    Comment: Enterobacteriaceae represent a large family of gram negative bacteria, not a single organism. Refer to culture for further identification. CRITICAL RESULT CALLED TO, READ BACK BY AND VERIFIED WITH: Marcy Siren PharmD 16:25 07/19/17 ([email or web address])    Enterobacter cloacae complex NOT DETECTED NOT DETECTED Final   Escherichia coli NOT DETECTED NOT DETECTED Final   Klebsiella oxytoca NOT DETECTED NOT DETECTED Final   Klebsiella pneumoniae NOT DETECTED NOT DETECTED Final   Proteus species NOT DETECTED NOT DETECTED Final   Serratia marcescens NOT DETECTED NOT DETECTED Final   Carbapenem resistance NOT DETECTED NOT DETECTED Final   Haemophilus influenzae NOT DETECTED NOT DETECTED Final   Neisseria meningitidis NOT DETECTED NOT DETECTED Final   Pseudomonas aeruginosa NOT DETECTED NOT DETECTED Final   Candida albicans NOT DETECTED NOT DETECTED Final   Candida glabrata NOT DETECTED NOT DETECTED Final   Candida krusei NOT DETECTED NOT DETECTED Final   Candida parapsilosis NOT DETECTED NOT DETECTED Final   Candida tropicalis NOT DETECTED NOT DETECTED Final    Comment: Performed at [facility], [place] [address], [place], [place] [address]  Culture, blood (routine x 2)     Status: None (Preliminary result)   Collection Time: 07/20/17  4:00 PM  Result Value Ref Range Status   Specimen Description   Final    BLOOD RIGHT  HAND Performed at [place] [address], [place], [place] [address]    Special Requests   Final    BOTTLES DRAWN AEROBIC AND ANAEROBIC Blood Culture adequate volume Performed at [place] [address], [place], [place] [address]    Culture   Final    NO GROWTH 4 DAYS Performed at [facility], [place] [address], [place], [place] [address]    Report Status PENDING  Incomplete  Culture, blood (routine x 2)     Status: None (Preliminary result)   Collection Time: 07/20/17  4:00 PM  Result Value Ref Range Status   Specimen Description   Final    BLOOD LEFT ANTECUBITAL Performed at [place] [address], [place], [place] [address]    Special Requests   Final    BOTTLES DRAWN AEROBIC ONLY Blood Culture adequate volume Performed at [place] [address], [place], [place] [address]    Culture   Final    NO GROWTH 4 DAYS Performed at [facility], [place] [address], [place], [place] [address]    Report Status PENDING  Incomplete         Radiology Studies: No results found.      Scheduled Meds: . budesonide  3 mg Oral Daily  . estradiol  0.5  mg Oral Daily  . feeding supplement (ENSURE ENLIVE)  237 mL Oral BID BM  . iron polysaccharides  150 mg Oral Daily  . metoprolol tartrate  12.5 mg Oral BID  . mirtazapine  15 mg Oral QHS  . nystatin  5 mL Oral QID  . potassium chloride  60 mEq Oral Once  . sodium bicarbonate  650 mg Oral BID  . vitamin B-12  100 mcg Oral Daily   Continuous Infusions: . sodium chloride    . cefTRIAXone (ROCEPHIN)  IV Stopped (07/24/17 1551)     LOS: 8 days        Aline August, MD [facility] Pager [phone]  If 7PM-7AM, please contact night-coverage [email or web address] Password [facility] 07/25/2017, 10:16 AM

## 2017-07-26 DIAGNOSIS — C659 Malignant neoplasm of unspecified renal pelvis: Secondary | ICD-10-CM

## 2017-07-26 LAB — BASIC METABOLIC PANEL
ANION GAP: 8 (ref 5–15)
BUN: 10 mg/dL (ref 6–20)
CHLORIDE: 110 mmol/L (ref 101–111)
CO2: 20 mmol/L — AB (ref 22–32)
Calcium: 6.7 mg/dL — ABNORMAL LOW (ref 8.9–10.3)
Creatinine, Ser: 0.94 mg/dL (ref 0.44–1.00)
GFR calc non Af Amer: 51 mL/min — ABNORMAL LOW (ref >60)
GFR, EST AFRICAN AMERICAN: 60 mL/min — AB (ref >60)
Glucose, Bld: 111 mg/dL — ABNORMAL HIGH (ref 65–99)
POTASSIUM: 3.6 mmol/L (ref 3.5–5.1)
SODIUM: 138 mmol/L (ref 135–145)

## 2017-07-26 LAB — CBC WITH DIFFERENTIAL/PLATELET
BASOS ABS: 0 10*3/uL (ref 0.0–0.1)
Basophils Relative: 0 %
Eosinophils Absolute: 0.1 10*3/uL (ref 0.0–0.7)
Eosinophils Relative: 1 %
HEMATOCRIT: 25.9 % — AB (ref 36.0–46.0)
HEMOGLOBIN: 8 g/dL — AB (ref 12.0–15.0)
LYMPHS PCT: 8 %
Lymphs Abs: 1.2 10*3/uL (ref 0.7–4.0)
MCH: 25.6 pg — ABNORMAL LOW (ref 26.0–34.0)
MCHC: 30.9 g/dL (ref 30.0–36.0)
MCV: 83 fL (ref 78.0–100.0)
MONOS PCT: 15 %
Monocytes Absolute: 2.2 10*3/uL — ABNORMAL HIGH (ref 0.1–1.0)
NEUTROS ABS: 11.3 10*3/uL — AB (ref 1.7–7.7)
NEUTROS PCT: 76 %
Platelets: 295 10*3/uL (ref 150–400)
RBC: 3.12 MIL/uL — AB (ref 3.87–5.11)
RDW: 17.2 % — ABNORMAL HIGH (ref 11.5–15.5)
WBC: 14.8 10*3/uL — ABNORMAL HIGH (ref 4.0–10.5)

## 2017-07-26 LAB — TYPE AND SCREEN
ABO/RH(D): A POS
Antibody Screen: NEGATIVE
UNIT DIVISION: 0

## 2017-07-26 LAB — MAGNESIUM: Magnesium: 1.7 mg/dL (ref 1.7–2.4)

## 2017-07-26 LAB — BPAM RBC
Blood Product Expiration Date: 201905192359
ISSUE DATE / TIME: 201904251006
Unit Type and Rh: 6200

## 2017-07-26 MED ORDER — CEFTRIAXONE IV (FOR PTA / DISCHARGE USE ONLY): 2.0000 g | INTRAVENOUS | 0 refills | Status: DC

## 2017-07-26 MED ORDER — ENSURE ENLIVE PO LIQD: 237.0000 mL | Freq: Two times a day (BID) | ORAL | 12 refills | Status: AC

## 2017-07-26 MED ORDER — ONDANSETRON 4 MG PO TBDP: 4.0000 mg | ORAL_TABLET | Freq: Three times a day (TID) | ORAL | 0 refills | Status: DC | PRN

## 2017-07-26 MED ORDER — HEPARIN SOD (PORK) LOCK FLUSH 100 UNIT/ML IV SOLN
500.0000 [IU] | INTRAVENOUS | Status: AC | PRN
  Administered 2017-07-26: 500 [IU]

## 2017-07-26 NOTE — Progress Notes (Signed)
[id number] Tiffany [name],bsn,rn3,ccm/[phone]/ptar called for transport home/med. necessity form left on unit

## 2017-07-26 NOTE — Progress Notes (Signed)
Patient has discharged to home on 07/26/17. Discharge instruction including medication and appointment was given to patient. CM is notified for [facility]. Patient has no question at this time.

## 2017-07-26 NOTE — Discharge Summary (Signed)
Physician Discharge Summary  CHAUNDA VANDERGRIFF ZDG:[id number] DOB: Mar 11, 1925 DOA: 07/17/2017  PCP: [name], MD  Admit date: 07/17/2017 Discharge date: 07/26/2017  Admitted From: Home Disposition: Home  Recommendations for Outpatient Follow-up:  1. Follow up with PCP in 1 week with repeat CBC/BMP 2. Follow-up with infectious disease outpatient 3. Follow-up with oncology/Dr. 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  She also had drop in her hemoglobin.  GI was consulted.  C. difficile was negative.  She was found to have salmonella enteritis and bacteremia.  ID was consulted.  Her antibiotics were switched to IV Rocephin.  ID recommended 2 weeks of intravenous Rocephin.  She will be discharged home with home health on intravenous antibiotics.     Discharge Diagnoses:  Active Problems:   Diarrhea in adult patient  Sepsis from enteritis and bacteremia -Resolved.  Hemodynamically stable.  Antibiotic plan as below  Salmonella enteritis and bacteremia -Patient reports that her diarrhea has much improved with no recent blood in stools -Currently on Rocephin; ID recommended 2 weeks of IV Rocephin with 07/20/2017 as a start date -Repeat blood culture on 4/20/ 2019 has been negative so far -Case was discussed with Dr. Comer/ID by prior hospitalist who had recommended that port can be left in -Discharge home today on IV Rocephin with home health.   Leukocytosis -Improving.  Antibiotic plan as above.  Outpatient follow-up  Acute blood loss anemia on top of anemia of chronic disease -Baseline hemoglobin around 9.  Hemoglobin is 8 today.    Status post 1 unit packed red cell  transfusion on 07/25/2017.  Outpatient follow-up of CBC  History of ulcerative colitis -Outpatient follow-up with GI.  GI has signed off.  Continue budesonide  Hypokalemia -Improved.  Outpatient follow-up  Hypomagnesemia -Improved  AKA on chronic kidney disease stage III -Resolved.  Creatinine stable.  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 Irene Limbo    Discharge Instructions  Discharge Instructions    Ambulatory referral to Infectious Disease   Complete by:  As directed    Salmonella bacteremia   Ambulatory referral to Oncology   Complete by:  As directed    Bladder cancer   Call MD for:  difficulty breathing, headache or visual disturbances   Complete by:  As directed    Call MD for:  extreme fatigue   Complete by:  As directed    Call MD for:  hives   Complete by:  As directed    Call MD for:  persistant dizziness or light-headedness   Complete by:  As directed    Call MD for:  persistant nausea and vomiting   Complete by:  As directed    Call MD for:  severe uncontrolled pain   Complete by:  As directed    Call MD for:  temperature >100.4   Complete by:  As directed    Diet - low sodium heart healthy   Complete by:  As directed    Home infusion instructions [facility] May follow [place] Dosing Protocol; May administer Cathflo as needed to maintain patency of vascular access device.; Flushing  of vascular access device: per [facility] Protocol: 0.9% NaCl pre/post medica...   Complete by:  As directed    Instructions:  May follow [place] Dosing Protocol   Instructions:  May administer Cathflo as needed to maintain patency of vascular access device.   Instructions:  Flushing of vascular access device: per [facility] Protocol: 0.9% NaCl pre/post medication administration and prn  patency; Heparin 100 u/ml, 75m for implanted ports and Heparin 10u/ml, 531mfor all other central venous catheters.   Instructions:  May follow [facility] Anaphylaxis Protocol for First Dose Administration in the home: 0.9% NaCl at 25-50 ml/hr to maintain IV access for protocol meds. Epinephrine 0.3 ml IV/IM PRN and Benadryl 25-50 IV/IM PRN s/s of anaphylaxis.   Instructions:  [facility] Coordinator (RN) to assist per patient IV care needs in the home PRN.   Increase activity slowly   Complete by:  As directed      Allergies as of 07/26/2017      Reactions   Nitrofurantoin Other (See Comments)   Trembling   Indomethacin Swelling, Other (See Comments)   Tongue swelling   Lisinopril Swelling, Other (See Comments)   Tongue swelling      Medication List    STOP taking these medications   senna-docusate 8.6-50 MG tablet Commonly known as:  SENNA S     TAKE these medications   acetaminophen 650 MG CR tablet Commonly known as:  TYLENOL Take 1,300 mg by mouth 2 (two) times daily.   amLODipine 5 MG tablet Commonly known as:  NORVASC Take 5 mg by mouth daily.   benzonatate 100 MG capsule Commonly known as:  TESSALON Take 1 capsule (100 mg total) by mouth 3 (three) times daily as needed for cough.   budesonide 3 MG 24 hr capsule Commonly known as:  ENTOCORT EC Take 1 capsule (3 mg total) by mouth daily. Take once daily for 7-10 days then every other day for 10 days then stop if no GI bleeding.   cefTRIAXone IVPB Commonly known as:  ROCEPHIN Inject 2 g into the vein daily. Indication:  bacteremia Last Day of Therapy:  08/02/17 Labs - Once weekly:  CBC/D and BMP, Labs - Every other week:  ESR and CRP   diclofenac sodium 1 % Gel Commonly known as:  VOLTAREN APPLY 2 TO 4 GRAMS Q 6 H PRN FOR PAIN   estradiol 0.5 MG tablet Commonly known as:  ESTRACE Take 0.5 mg by mouth daily.   feeding supplement (ENSURE ENLIVE) Liqd Take 237 mLs by mouth 2 (two) times daily between  meals.   FLUTTER Devi 1 each by Does not apply route daily.   iron polysaccharides 150 MG capsule Commonly known as:  NIFEREX Take 1 capsule (150 mg total) by mouth daily.   mirtazapine 15 MG tablet Commonly known as:  REMERON TAKE 1 TABLET(15 MG) BY MOUTH AT BEDTIME   ondansetron 4 MG disintegrating tablet Commonly known as:  ZOFRAN ODT Take 1 tablet (4 mg total) by mouth every 8 (eight) hours as needed for nausea or vomiting.   PRESCRIPTION MEDICATION Chemo card   triamcinolone ointment 0.5 % Commonly known as:  KENALOG Apply 1 application topically 2 (two) times daily.   vitamin B-12 100 MCG tablet Commonly known as:  CYANOCOBALAMIN Take 100 mcg by mouth daily.            Home Infusion Instuctions  (From admission, onward)        Start  Ordered   07/26/17 0000  Home infusion instructions [facility] May follow [facility] Dosing Protocol; May administer Cathflo as needed to maintain patency of vascular access device.; Flushing of vascular access device: per [facility] Protocol: 0.9% NaCl pre/post medica...    Question Answer Comment  Instructions May follow [facility] Dosing Protocol   Instructions May administer Cathflo as needed to maintain patency of vascular access device.   Instructions Flushing of vascular access device: per [facility] Protocol: 0.9% NaCl pre/post medication administration and prn patency; Heparin 100 u/ml, 64m for implanted ports and Heparin 10u/ml, 578mfor all other central venous catheters.   Instructions May follow [facility] Anaphylaxis Protocol for First Dose Administration in the home: 0.9% NaCl at 25-50 ml/hr to maintain IV access for protocol meds. Epinephrine 0.3 ml IV/IM PRN and Benadryl 25-50 IV/IM PRN s/s of anaphylaxis.   Instructions [facility] Infusion Coordinator (RN) to assist per patient IV care needs in the home PRN.      07/26/17 0950     Follow-up Information    Polite, RoJori MollMD. Schedule an appointment as soon as  possible for a visit in 1 week(s).   Specialty:  Internal Medicine Why:  with cbc/bmp Contact information: [address] [place] [phone]        Allergies  Allergen Reactions  . Nitrofurantoin Other (See Comments)    Trembling  . Indomethacin Swelling and Other (See Comments)    Tongue swelling  . Lisinopril Swelling and Other (See Comments)    Tongue swelling    Consultations: GI/ID      Procedures/Studies: Dg Chest 2 View  Result Date: 07/18/2017 CLINICAL DATA:  Fever EXAM: CHEST - 2 VIEW COMPARISON:  Chest x-ray dated 05/01/2015. FINDINGS: Heart size and mediastinal contours are within normal limits. Lungs are clear. No pleural effusion or pneumothorax seen. No acute or suspicious osseous finding. RIGHT chest wall Port-A-Cath is stable in position with tip at the level of the mid/lower SVC. IMPRESSION: No active cardiopulmonary disease. No evidence of pneumonia or pulmonary edema. Electronically Signed   By: StFranki Cabot.D.   On: 07/18/2017 21:45     Subjective: Patient seen and examined at bedside.  She feels stronger today.  She wants to go home.  No overnight fever or vomiting or bloody stools.  Discharge Exam: Vitals:   07/25/17 2038 07/26/17 0455  BP: (!) 144/78 131/70  Pulse: (!) 101 94  Resp: 17 17  Temp: 99.2 F (37.3 C) 98 F (36.7 C)  SpO2: 99% 94%   Vitals:   07/25/17 1300 07/25/17 1434 07/25/17 2038 07/26/17 0455  BP: 140/74 140/72 (!) 144/78 131/70  Pulse: 87 95 (!) 101 94  Resp: 16 18 17 17   Temp: 99.2 F (37.3 C) 100.1 F (37.8 C) 99.2 F (37.3 C) 98 F (36.7 C)  TempSrc: Axillary Oral Oral Oral  SpO2: 92% 96% 99% 94%  Weight:      Height:        General: Pt is alert, awake, not in acute distress Cardiovascular: Rate controlled, S1/S2 + Respiratory: Bilateral decreased breath sounds at bases Abdominal: Soft, NT, ND, bowel sounds + Extremities: no edema, no cyanosis    The results of  significant diagnostics from this hospitalization (including imaging, microbiology, ancillary and laboratory) are listed below for reference.     Microbiology: Recent Results (from the past 240 hour(s))  C difficile quick scan w PCR reflex  Status: None   Collection Time: 07/18/17  1:00 AM  Result Value Ref Range Status   C Diff antigen NEGATIVE NEGATIVE Final   C Diff toxin NEGATIVE NEGATIVE Final   C Diff interpretation No C. difficile detected.  Final    Comment: Performed at [facility], [place] [address] [place], [place] [address]  Gastrointestinal Panel by PCR , Stool     Status: Abnormal   Collection Time: 07/18/17  1:00 AM  Result Value Ref Range Status   Campylobacter species NOT DETECTED NOT DETECTED Final   Plesimonas shigelloides NOT DETECTED NOT DETECTED Final   Salmonella species DETECTED (A) NOT DETECTED Final    Comment: RESULT CALLED TO, READ BACK BY AND VERIFIED WITH: MACKENZIE HASTINGS @ 2114 ON 07/18/2017 BY CAF    Yersinia enterocolitica NOT DETECTED NOT DETECTED Final   Vibrio species NOT DETECTED NOT DETECTED Final   Vibrio cholerae NOT DETECTED NOT DETECTED Final   Enteroaggregative E coli (EAEC) NOT DETECTED NOT DETECTED Final   Enteropathogenic E coli (EPEC) NOT DETECTED NOT DETECTED Final   Enterotoxigenic E coli (ETEC) NOT DETECTED NOT DETECTED Final   Shiga like toxin producing E coli (STEC) NOT DETECTED NOT DETECTED Final   Shigella/Enteroinvasive E coli (EIEC) NOT DETECTED NOT DETECTED Final   Cryptosporidium NOT DETECTED NOT DETECTED Final   Cyclospora cayetanensis NOT DETECTED NOT DETECTED Final   Entamoeba histolytica NOT DETECTED NOT DETECTED Final   Giardia lamblia NOT DETECTED NOT DETECTED Final   Adenovirus F40/41 NOT DETECTED NOT DETECTED Final   Astrovirus NOT DETECTED NOT DETECTED Final   Norovirus GI/GII NOT DETECTED NOT DETECTED Final   Rotavirus A NOT DETECTED NOT DETECTED Final   Sapovirus (I, II, IV, and V) NOT  DETECTED NOT DETECTED Final    Comment: Performed at [facility], [place] [place], [place] [address]  Culture, blood (routine x 2)     Status: Abnormal   Collection Time: 07/18/17  9:33 AM  Result Value Ref Range Status   Specimen Description   Final    BLOOD RIGHT HAND Performed at [facility] [address], [place], [place] [address]    Special Requests   Final    BOTTLES DRAWN AEROBIC AND ANAEROBIC Blood Culture adequate volume Performed at [facility] [address], [place], [place] [address]    Culture  Setup Time   Final    GRAM NEGATIVE RODS IN BOTH AEROBIC AND ANAEROBIC BOTTLES CRITICAL RESULT CALLED TO, READ BACK BY AND VERIFIED WITH: B GREEN PHARMD 07/19/17 0223 JDW    Culture (A)  Final    SALMONELLA SPECIES SUSCEPTIBILITIES PERFORMED ON PREVIOUS CULTURE WITHIN THE LAST 5 DAYS. HEALTH DEPARTMENT NOTIFIED Performed at [facility], [place] [address] [place], [place] [address]    Report Status 07/21/2017 FINAL  Final  Blood Culture ID Panel (Reflexed)     Status: Abnormal   Collection Time: 07/18/17  9:33 AM  Result Value Ref Range Status   Enterococcus species NOT DETECTED NOT DETECTED Final   Listeria monocytogenes NOT DETECTED NOT DETECTED Final   Staphylococcus species NOT DETECTED NOT DETECTED Final   Staphylococcus aureus NOT DETECTED NOT DETECTED Final   Streptococcus species NOT DETECTED NOT DETECTED Final   Streptococcus agalactiae NOT DETECTED NOT DETECTED Final   Streptococcus pneumoniae NOT DETECTED NOT DETECTED Final   Streptococcus pyogenes NOT DETECTED NOT DETECTED Final   Acinetobacter baumannii NOT DETECTED NOT DETECTED Final  Enterobacteriaceae species DETECTED (A) NOT DETECTED Final    Comment: Enterobacteriaceae represent a large family of gram negative bacteria, not a single organism. Refer to culture for further identification. CRITICAL RESULT CALLED TO, READ BACK BY AND VERIFIED  WITH: B GREEN PHARMD 07/19/17 0223 JDW    Enterobacter cloacae complex NOT DETECTED NOT DETECTED Final   Escherichia coli NOT DETECTED NOT DETECTED Final   Klebsiella oxytoca NOT DETECTED NOT DETECTED Final   Klebsiella pneumoniae NOT DETECTED NOT DETECTED Final   Proteus species NOT DETECTED NOT DETECTED Final   Serratia marcescens NOT DETECTED NOT DETECTED Final   Carbapenem resistance NOT DETECTED NOT DETECTED Final   Haemophilus influenzae NOT DETECTED NOT DETECTED Final   Neisseria meningitidis NOT DETECTED NOT DETECTED Final   Pseudomonas aeruginosa NOT DETECTED NOT DETECTED Final   Candida albicans NOT DETECTED NOT DETECTED Final   Candida glabrata NOT DETECTED NOT DETECTED Final   Candida krusei NOT DETECTED NOT DETECTED Final   Candida parapsilosis NOT DETECTED NOT DETECTED Final   Candida tropicalis NOT DETECTED NOT DETECTED Final  Culture, blood (routine x 2)     Status: None   Collection Time: 07/18/17  9:38 AM  Result Value Ref Range Status   Specimen Description   Final    BLOOD LEFT HAND Performed at [facility], [place] [address], [place], [place] [address]    Special Requests   Final    BOTTLES DRAWN AEROBIC ONLY Blood Culture results may not be optimal due to an inadequate volume of blood received in culture bottles Performed at [facility] [address] [place], [place] [address]    Culture   Final    NO GROWTH 5 DAYS Performed at [facility], [place] [address], [place], [place] [address]    Report Status 07/23/2017 FINAL  Final  Culture, blood (routine x 2)     Status: Abnormal   Collection Time: 07/18/17 10:38 PM  Result Value Ref Range Status   Specimen Description   Final    BLOOD LEFT HAND Performed at [facility] [address], [place], [place] [address]    Special Requests   Final    BOTTLES DRAWN AEROBIC AND ANAEROBIC Blood Culture adequate volume Performed at [facility] [address], [place], [place] [address]    Culture  Setup Time   Final    GRAM NEGATIVE RODS IN BOTH AEROBIC AND ANAEROBIC BOTTLES CRITICAL VALUE NOTED.  VALUE IS CONSISTENT WITH PREVIOUSLY REPORTED AND CALLED VALUE.    Culture (A)  Final    SALMONELLA SPECIES SUSCEPTIBILITIES PERFORMED ON PREVIOUS CULTURE WITHIN THE LAST 5 DAYS. HEALTH DEPARTMENT NOTIFIED Performed at [facility], [place] [address] [place], [place] [address]    Report Status 07/21/2017 FINAL  Final  Culture, blood (routine x 2)     Status: Abnormal (Preliminary result)   Collection Time: 07/18/17 10:38 PM  Result Value Ref Range Status   Specimen Description   Final    BLOOD RIGHT HAND Performed at [facility] [address] [place], [place] [address]    Special Requests   Final    BOTTLES DRAWN AEROBIC AND ANAEROBIC Blood Culture adequate volume Performed at [facility] [address], [place], [place] [address]    Culture  Setup Time   Final    GRAM NEGATIVE RODS IN BOTH AEROBIC AND ANAEROBIC BOTTLES CRITICAL RESULT CALLED TO, READ BACK BY  AND VERIFIED WITH: Marcy Siren PharmD 16:25 07/19/17 ([email or web address])    Culture (A)  Final    [place] NOTIFIED Referred to [facility] in [place], [place] for [place]. Performed at [facility], [place] [address], [place], [place] [address]    Report Status PENDING  Incomplete   Organism ID, Bacteria SALMONELLA SPECIES  Final      Susceptibility   Salmonella species - MIC*    AMPICILLIN <=2 SENSITIVE Sensitive     LEVOFLOXACIN <=0.12 SENSITIVE Sensitive     TRIMETH/SULFA <=20 SENSITIVE Sensitive     * SALMONELLA SPECIES  Blood Culture ID Panel (Reflexed)     Status: Abnormal   Collection Time: 07/18/17 10:38 PM  Result Value Ref Range Status   Enterococcus species NOT DETECTED NOT DETECTED Final    Comment: RESISTANT   Listeria monocytogenes NOT  DETECTED NOT DETECTED Final   Staphylococcus species NOT DETECTED NOT DETECTED Final   Staphylococcus aureus NOT DETECTED NOT DETECTED Final   Streptococcus species NOT DETECTED NOT DETECTED Final   Streptococcus agalactiae NOT DETECTED NOT DETECTED Final   Streptococcus pneumoniae NOT DETECTED NOT DETECTED Final   Streptococcus pyogenes NOT DETECTED NOT DETECTED Final   Acinetobacter baumannii NOT DETECTED NOT DETECTED Final   Enterobacteriaceae species DETECTED (A) NOT DETECTED Final    Comment: Enterobacteriaceae represent a large family of gram negative bacteria, not a single organism. Refer to culture for further identification. CRITICAL RESULT CALLED TO, READ BACK BY AND VERIFIED WITH: Marcy Siren PharmD 16:25 07/19/17 ([email or web address])    Enterobacter cloacae complex NOT DETECTED NOT DETECTED Final   Escherichia coli NOT DETECTED NOT DETECTED Final   Klebsiella oxytoca NOT DETECTED NOT DETECTED Final   Klebsiella pneumoniae NOT DETECTED NOT DETECTED Final   Proteus species NOT DETECTED NOT DETECTED Final   Serratia marcescens NOT DETECTED NOT DETECTED Final   Carbapenem resistance NOT DETECTED NOT DETECTED Final   Haemophilus influenzae NOT DETECTED NOT DETECTED Final   Neisseria meningitidis NOT DETECTED NOT DETECTED Final   Pseudomonas aeruginosa NOT DETECTED NOT DETECTED Final   Candida albicans NOT DETECTED NOT DETECTED Final   Candida glabrata NOT DETECTED NOT DETECTED Final   Candida krusei NOT DETECTED NOT DETECTED Final   Candida parapsilosis NOT DETECTED NOT DETECTED Final   Candida tropicalis NOT DETECTED NOT DETECTED Final    Comment: Performed at [facility], [place] [address] [place], [place] [address]  Culture, blood (routine x 2)     Status: None   Collection Time: 07/20/17  4:00 PM  Result Value Ref Range Status   Specimen Description   Final    BLOOD RIGHT HAND Performed at [facility] [address], [place], [place] [address]    Special  Requests   Final    BOTTLES DRAWN AEROBIC AND ANAEROBIC Blood Culture adequate volume Performed at [facility] [address], [place], [place] [address]    Culture   Final    NO GROWTH 5 DAYS Performed at [facility], [place] [address] [place], [place] [address]    Report Status 07/25/2017 FINAL  Final  Culture, blood (routine x 2)     Status: None   Collection Time: 07/20/17  4:00 PM  Result Value Ref Range Status   Specimen Description   Final    BLOOD LEFT ANTECUBITAL Performed at [facility] [address] [place], [place] [address]    Special  Requests   Final    BOTTLES DRAWN AEROBIC ONLY Blood Culture adequate volume Performed at [facility] [address] [place], [place] [address]    Culture   Final    NO GROWTH 5 DAYS Performed at [facility], [place] [address] [place], [place] [address]    Report Status 07/25/2017 FINAL  Final     Labs: BNP (last 3 results) No results for input(s): BNP in the last 8760 hours. Basic Metabolic Panel: Recent Labs  Lab 07/21/17 0449 07/22/17 0539 07/23/17 0414 07/24/17 0507 07/25/17 0359 07/26/17 0425  NA 135 137 137 138 138 138  K 3.7 3.8 3.1* 3.0* 3.4* 3.6  CL 107 109 108 111 110 110  CO2 18* 19* 18* 18* 19* 20*  GLUCOSE 103* 113* 114* 115* 124* 111*  BUN 22* 15 12 11 10 10   CREATININE 1.15* 0.97 0.91 0.76 0.87 0.94  CALCIUM 7.7* 7.2* 6.8* 6.5* 6.6* 6.7*  MG 1.9 1.7  --  1.4* 1.9 1.7   Liver Function Tests: No results for input(s): AST, ALT, ALKPHOS, BILITOT, PROT, ALBUMIN in the last 168 hours. No results for input(s): LIPASE, AMYLASE in the last 168 hours. No results for input(s): AMMONIA in the last 168 hours. CBC: Recent Labs  Lab 07/21/17 0449 07/22/17 0539 07/24/17 0507 07/25/17 0359 07/26/17 0425  WBC 12.4* 16.6* 14.1* 13.1* 14.8*  NEUTROABS  --   --  10.8* 9.7* 11.3*  HGB 7.2* 7.7* 7.3* 6.8* 8.0*  HCT 23.2* 24.7* 23.6* 21.4*  25.9*  MCV 82.9 82.9 81.7 82.0 83.0  PLT 275 279 297 288 295   Cardiac Enzymes: No results for input(s): CKTOTAL, CKMB, CKMBINDEX, TROPONINI in the last 168 hours. BNP: Invalid input(s): POCBNP CBG: No results for input(s): GLUCAP in the last 168 hours. D-Dimer No results for input(s): DDIMER in the last 72 hours. Hgb A1c No results for input(s): HGBA1C in the last 72 hours. Lipid Profile No results for input(s): CHOL, HDL, LDLCALC, TRIG, CHOLHDL, LDLDIRECT in the last 72 hours. Thyroid function studies No results for input(s): TSH, T4TOTAL, T3FREE, THYROIDAB in the last 72 hours.  Invalid input(s): FREET3 Anemia work up No results for input(s): VITAMINB12, FOLATE, FERRITIN, TIBC, IRON, RETICCTPCT in the last 72 hours. Urinalysis    Component Value Date/Time   COLORURINE YELLOW 07/18/2017 1208   APPEARANCEUR HAZY (A) 07/18/2017 1208   LABSPEC 1.014 07/18/2017 1208   LABSPEC 1.005 11/04/2015 1503   PHURINE 5.0 07/18/2017 1208   GLUCOSEU NEGATIVE 07/18/2017 1208   GLUCOSEU Negative 11/04/2015 1503   HGBUR MODERATE (A) 07/18/2017 1208   BILIRUBINUR NEGATIVE 07/18/2017 1208   BILIRUBINUR Negative 11/04/2015 1503   KETONESUR NEGATIVE 07/18/2017 1208   PROTEINUR 30 (A) 07/18/2017 1208   UROBILINOGEN 0.2 11/04/2015 1503   NITRITE NEGATIVE 07/18/2017 1208   LEUKOCYTESUR NEGATIVE 07/18/2017 1208   LEUKOCYTESUR Small 11/04/2015 1503   Sepsis Labs Invalid input(s): PROCALCITONIN,  WBC,  LACTICIDVEN Microbiology Recent Results (from the past 240 hour(s))  C difficile quick scan w PCR reflex     Status: None   Collection Time: 07/18/17  1:00 AM  Result Value Ref Range Status   C Diff antigen NEGATIVE NEGATIVE Final   C Diff toxin NEGATIVE NEGATIVE Final   C Diff interpretation No C. difficile detected.  Final    Comment: Performed at [facility], [place] [address], [place], [place] [address]  Gastrointestinal Panel by PCR , Stool     Status: Abnormal  Collection Time: 07/18/17  1:00 AM  Result Value Ref Range Status   Campylobacter species NOT DETECTED NOT DETECTED Final   Plesimonas shigelloides NOT DETECTED NOT DETECTED Final   Salmonella species DETECTED (A) NOT DETECTED Final    Comment: RESULT CALLED TO, READ BACK BY AND VERIFIED WITH: MACKENZIE HASTINGS @ 2114 ON 07/18/2017 BY CAF    Yersinia enterocolitica NOT DETECTED NOT DETECTED Final   Vibrio species NOT DETECTED NOT DETECTED Final   Vibrio cholerae NOT DETECTED NOT DETECTED Final   Enteroaggregative E coli (EAEC) NOT DETECTED NOT DETECTED Final   Enteropathogenic E coli (EPEC) NOT DETECTED NOT DETECTED Final   Enterotoxigenic E coli (ETEC) NOT DETECTED NOT DETECTED Final   Shiga like toxin producing E coli (STEC) NOT DETECTED NOT DETECTED Final   Shigella/Enteroinvasive E coli (EIEC) NOT DETECTED NOT DETECTED Final   Cryptosporidium NOT DETECTED NOT DETECTED Final   Cyclospora cayetanensis NOT DETECTED NOT DETECTED Final   Entamoeba histolytica NOT DETECTED NOT DETECTED Final   Giardia lamblia NOT DETECTED NOT DETECTED Final   Adenovirus F40/41 NOT DETECTED NOT DETECTED Final   Astrovirus NOT DETECTED NOT DETECTED Final   Norovirus GI/GII NOT DETECTED NOT DETECTED Final   Rotavirus A NOT DETECTED NOT DETECTED Final   Sapovirus (I, II, IV, and V) NOT DETECTED NOT DETECTED Final    Comment: Performed at [facility], [address] [place], [place] [address]  Culture, blood (routine x 2)     Status: Abnormal   Collection Time: 07/18/17  9:33 AM  Result Value Ref Range Status   Specimen Description   Final    BLOOD RIGHT HAND Performed at [facility], [place] [address], [place], [place] [address]    Special Requests   Final    BOTTLES DRAWN AEROBIC AND ANAEROBIC Blood Culture adequate volume Performed at [facility] [address] [place], [place] [address]    Culture  Setup Time   Final    GRAM NEGATIVE RODS IN BOTH  AEROBIC AND ANAEROBIC BOTTLES CRITICAL RESULT CALLED TO, READ BACK BY AND VERIFIED WITH: B GREEN PHARMD 07/19/17 0223 JDW    Culture (A)  Final    SALMONELLA SPECIES SUSCEPTIBILITIES PERFORMED ON PREVIOUS CULTURE WITHIN THE LAST 5 DAYS. HEALTH DEPARTMENT NOTIFIED Performed at [facility], [place] [address] [place], [place] [address]    Report Status 07/21/2017 FINAL  Final  Blood Culture ID Panel (Reflexed)     Status: Abnormal   Collection Time: 07/18/17  9:33 AM  Result Value Ref Range Status   Enterococcus species NOT DETECTED NOT DETECTED Final   Listeria monocytogenes NOT DETECTED NOT DETECTED Final   Staphylococcus species NOT DETECTED NOT DETECTED Final   Staphylococcus aureus NOT DETECTED NOT DETECTED Final   Streptococcus species NOT DETECTED NOT DETECTED Final   Streptococcus agalactiae NOT DETECTED NOT DETECTED Final   Streptococcus pneumoniae NOT DETECTED NOT DETECTED Final   Streptococcus pyogenes NOT DETECTED NOT DETECTED Final   Acinetobacter baumannii NOT DETECTED NOT DETECTED Final   Enterobacteriaceae species DETECTED (A) NOT DETECTED Final    Comment: Enterobacteriaceae represent a large family of gram negative bacteria, not a single organism. Refer to culture for further identification. CRITICAL RESULT CALLED TO, READ BACK BY AND VERIFIED WITH: B GREEN PHARMD 07/19/17 0223 JDW    Enterobacter cloacae complex NOT DETECTED NOT DETECTED Final   Escherichia coli NOT DETECTED NOT DETECTED Final   Klebsiella oxytoca NOT DETECTED NOT DETECTED Final  Klebsiella pneumoniae NOT DETECTED NOT DETECTED Final   Proteus species NOT DETECTED NOT DETECTED Final   Serratia marcescens NOT DETECTED NOT DETECTED Final   Carbapenem resistance NOT DETECTED NOT DETECTED Final   Haemophilus influenzae NOT DETECTED NOT DETECTED Final   Neisseria meningitidis NOT DETECTED NOT DETECTED Final   Pseudomonas aeruginosa NOT DETECTED NOT DETECTED Final   Candida albicans NOT DETECTED NOT  DETECTED Final   Candida glabrata NOT DETECTED NOT DETECTED Final   Candida krusei NOT DETECTED NOT DETECTED Final   Candida parapsilosis NOT DETECTED NOT DETECTED Final   Candida tropicalis NOT DETECTED NOT DETECTED Final  Culture, blood (routine x 2)     Status: None   Collection Time: 07/18/17  9:38 AM  Result Value Ref Range Status   Specimen Description   Final    BLOOD LEFT HAND Performed at [facility], [place] [address], [place], [place] [address]    Special Requests   Final    BOTTLES DRAWN AEROBIC ONLY Blood Culture results may not be optimal due to an inadequate volume of blood received in culture bottles Performed at [facility] [address], [place], [place] [address]    Culture   Final    NO GROWTH 5 DAYS Performed at [facility], [place] [address] [place], [place] [address]    Report Status 07/23/2017 FINAL  Final  Culture, blood (routine x 2)     Status: Abnormal   Collection Time: 07/18/17 10:38 PM  Result Value Ref Range Status   Specimen Description   Final    BLOOD LEFT HAND Performed at [facility] [address], [place], [place] [address]    Special Requests   Final    BOTTLES DRAWN AEROBIC AND ANAEROBIC Blood Culture adequate volume Performed at [facility] [address], [place], [place] [address]    Culture  Setup Time   Final    GRAM NEGATIVE RODS IN BOTH AEROBIC AND ANAEROBIC BOTTLES CRITICAL VALUE NOTED.  VALUE IS CONSISTENT WITH PREVIOUSLY REPORTED AND CALLED VALUE.    Culture (A)  Final    SALMONELLA SPECIES SUSCEPTIBILITIES PERFORMED ON PREVIOUS CULTURE WITHIN THE LAST 5 DAYS. HEALTH DEPARTMENT NOTIFIED Performed at [facility], [place] [address], [place], [place] [address]    Report Status 07/21/2017 FINAL  Final  Culture, blood (routine x 2)     Status: Abnormal (Preliminary result)   Collection Time: 07/18/17 10:38 PM  Result Value Ref Range  Status   Specimen Description   Final    BLOOD RIGHT HAND Performed at [facility] [address], [place], [place] [address]    Special Requests   Final    BOTTLES DRAWN AEROBIC AND ANAEROBIC Blood Culture adequate volume Performed at [facility] [address], [place], [place] [address]    Culture  Setup Time   Final    GRAM NEGATIVE RODS IN BOTH AEROBIC AND ANAEROBIC BOTTLES CRITICAL RESULT CALLED TO, READ BACK BY AND VERIFIED WITH: Marcy Siren PharmD 16:25 07/19/17 ([email or web address])    Culture (A)  Final    [place] NOTIFIED Referred to [facility] in [place], [place] for [place]. Performed at [facility], [place] [address], [place], [place] [address]    Report Status PENDING  Incomplete   Organism ID, Bacteria SALMONELLA SPECIES  Final      Susceptibility   Salmonella species -  MIC*    AMPICILLIN <=2 SENSITIVE Sensitive     LEVOFLOXACIN <=0.12 SENSITIVE Sensitive     TRIMETH/SULFA <=20 SENSITIVE Sensitive     * SALMONELLA SPECIES  Blood Culture ID Panel (Reflexed)     Status: Abnormal   Collection Time: 07/18/17 10:38 PM  Result Value Ref Range Status   Enterococcus species NOT DETECTED NOT DETECTED Final    Comment: RESISTANT   Listeria monocytogenes NOT DETECTED NOT DETECTED Final   Staphylococcus species NOT DETECTED NOT DETECTED Final   Staphylococcus aureus NOT DETECTED NOT DETECTED Final   Streptococcus species NOT DETECTED NOT DETECTED Final   Streptococcus agalactiae NOT DETECTED NOT DETECTED Final   Streptococcus pneumoniae NOT DETECTED NOT DETECTED Final   Streptococcus pyogenes NOT DETECTED NOT DETECTED Final   Acinetobacter baumannii NOT DETECTED NOT DETECTED Final   Enterobacteriaceae species DETECTED (A) NOT DETECTED Final    Comment: Enterobacteriaceae represent a large family of gram negative bacteria, not a single organism. Refer to culture for further  identification. CRITICAL RESULT CALLED TO, READ BACK BY AND VERIFIED WITH: Marcy Siren PharmD 16:25 07/19/17 ([email or web address])    Enterobacter cloacae complex NOT DETECTED NOT DETECTED Final   Escherichia coli NOT DETECTED NOT DETECTED Final   Klebsiella oxytoca NOT DETECTED NOT DETECTED Final   Klebsiella pneumoniae NOT DETECTED NOT DETECTED Final   Proteus species NOT DETECTED NOT DETECTED Final   Serratia marcescens NOT DETECTED NOT DETECTED Final   Carbapenem resistance NOT DETECTED NOT DETECTED Final   Haemophilus influenzae NOT DETECTED NOT DETECTED Final   Neisseria meningitidis NOT DETECTED NOT DETECTED Final   Pseudomonas aeruginosa NOT DETECTED NOT DETECTED Final   Candida albicans NOT DETECTED NOT DETECTED Final   Candida glabrata NOT DETECTED NOT DETECTED Final   Candida krusei NOT DETECTED NOT DETECTED Final   Candida parapsilosis NOT DETECTED NOT DETECTED Final   Candida tropicalis NOT DETECTED NOT DETECTED Final    Comment: Performed at [facility], [place] [address], [place], [place] [address]  Culture, blood (routine x 2)     Status: None   Collection Time: 07/20/17  4:00 PM  Result Value Ref Range Status   Specimen Description   Final    BLOOD RIGHT HAND Performed at [facility] [address], [place], [place] [address]    Special Requests   Final    BOTTLES DRAWN AEROBIC AND ANAEROBIC Blood Culture adequate volume Performed at [facility] [address], [place], [place] [address]    Culture   Final    NO GROWTH 5 DAYS Performed at [facility], [place] [address] [place], [place] [address]    Report Status 07/25/2017 FINAL  Final  Culture, blood (routine x 2)     Status: None   Collection Time: 07/20/17  4:00 PM  Result Value Ref Range Status   Specimen Description   Final    BLOOD LEFT ANTECUBITAL Performed at [facility] [address], [place], [place] [address]    Special Requests   Final     BOTTLES DRAWN AEROBIC ONLY Blood Culture adequate volume Performed at [facility] [address], [place], [place] [address]    Culture   Final    NO GROWTH 5 DAYS Performed at [facility], [place] [address] [place],  [address]    Report Status 07/25/2017 FINAL  Final     Time coordinating discharge: 35 minutes  SIGNED:  Aline August, MD  [facility] 07/26/2017, 9:51 AM Pager: [phone]  If 7PM-7AM, please contact night-coverage [email or web address] Password [email or web address]

## 2017-07-26 NOTE — Progress Notes (Signed)
LCSW met with patient at bedside.   Patient was dressed and stated she was going home with home health.   LCSW signing off no CSW needs.   Carolin [facility] [place] [facility]

## 2017-07-26 NOTE — Progress Notes (Signed)
[id number] Rosana Hoes BSN,RN3,CCM/[phone]/pt [facility] and pt needs/representive notified.

## 2017-07-26 NOTE — Care Management Note (Signed)
Case Management Note  Patient Details  Name: Tiffany [name] MRN: [id number] Date of Birth: September 14, 1924  Subjective/Objective:                    Action/Plan:   Expected Discharge Date:  07/26/17               Expected Discharge Plan:  [place]  In-House Referral:     Discharge planning Services     Post Acute Care Choice:  Home Health Choice offered to:  Patient  DME Arranged:    DME Agency:     HH Arranged:  RN, PT [place] Agency:  [facility]  Status of Service:  Completed, signed off  If discussed at [place] of Stay Meetings, dates discussed:    Additional Comments:  Leeroy Cha, RN 07/26/2017, 10:04 AM

## 2017-07-28 ENCOUNTER — Emergency Department (HOSPITAL_COMMUNITY): Payer: Medicare Other

## 2017-07-28 ENCOUNTER — Emergency Department (HOSPITAL_BASED_OUTPATIENT_CLINIC_OR_DEPARTMENT_OTHER)
Admit: 2017-07-28 | Discharge: 2017-07-28 | Disposition: A | Discharge status: Home / Self Care | Payer: Medicare Other | Attending: Emergency Medicine | Admitting: Emergency Medicine

## 2017-07-28 ENCOUNTER — Encounter (HOSPITAL_COMMUNITY): Payer: Self-pay | Admitting: Emergency Medicine

## 2017-07-28 ENCOUNTER — Emergency Department (HOSPITAL_COMMUNITY)
Admission: EM | Admit: 2017-07-28 | Discharge: 2017-07-28 | Disposition: A | Discharge status: Home / Self Care | Payer: Medicare Other | Attending: Emergency Medicine | Admitting: Emergency Medicine

## 2017-07-28 DIAGNOSIS — I509 Heart failure, unspecified: Secondary | ICD-10-CM | POA: Diagnosis not present

## 2017-07-28 DIAGNOSIS — R609 Edema, unspecified: Secondary | ICD-10-CM

## 2017-07-28 DIAGNOSIS — Z79899 Other long term (current) drug therapy: Secondary | ICD-10-CM | POA: Insufficient documentation

## 2017-07-28 DIAGNOSIS — C659 Malignant neoplasm of unspecified renal pelvis: Secondary | ICD-10-CM | POA: Diagnosis not present

## 2017-07-28 DIAGNOSIS — R7989 Other specified abnormal findings of blood chemistry: Secondary | ICD-10-CM | POA: Diagnosis not present

## 2017-07-28 DIAGNOSIS — R600 Localized edema: Secondary | ICD-10-CM | POA: Diagnosis not present

## 2017-07-28 DIAGNOSIS — R2243 Localized swelling, mass and lump, lower limb, bilateral: Secondary | ICD-10-CM | POA: Diagnosis present

## 2017-07-28 DIAGNOSIS — C7951 Secondary malignant neoplasm of bone: Secondary | ICD-10-CM | POA: Insufficient documentation

## 2017-07-28 DIAGNOSIS — I110 Hypertensive heart disease with heart failure: Secondary | ICD-10-CM | POA: Insufficient documentation

## 2017-07-28 DIAGNOSIS — C787 Secondary malignant neoplasm of liver and intrahepatic bile duct: Secondary | ICD-10-CM | POA: Diagnosis not present

## 2017-07-28 DIAGNOSIS — J90 Pleural effusion, not elsewhere classified: Secondary | ICD-10-CM | POA: Diagnosis not present

## 2017-07-28 LAB — COMPREHENSIVE METABOLIC PANEL
ALT: 19 U/L (ref 14–54)
ANION GAP: 9 (ref 5–15)
AST: 31 U/L (ref 15–41)
Albumin: 2.2 g/dL — ABNORMAL LOW (ref 3.5–5.0)
Alkaline Phosphatase: 55 U/L (ref 38–126)
BILIRUBIN TOTAL: 1 mg/dL (ref 0.3–1.2)
BUN: 12 mg/dL (ref 6–20)
CHLORIDE: 107 mmol/L (ref 101–111)
CO2: 22 mmol/L (ref 22–32)
Calcium: 7.7 mg/dL — ABNORMAL LOW (ref 8.9–10.3)
Creatinine, Ser: 0.83 mg/dL (ref 0.44–1.00)
GFR calc non Af Amer: 60 mL/min — ABNORMAL LOW (ref >60)
Glucose, Bld: 86 mg/dL (ref 65–99)
POTASSIUM: 4.5 mmol/L (ref 3.5–5.1)
Sodium: 138 mmol/L (ref 135–145)
TOTAL PROTEIN: 6.4 g/dL — AB (ref 6.5–8.1)

## 2017-07-28 LAB — CBC WITH DIFFERENTIAL/PLATELET
Basophils Absolute: 0 10*3/uL (ref 0.0–0.1)
Basophils Relative: 0 %
EOS PCT: 1 %
Eosinophils Absolute: 0.1 10*3/uL (ref 0.0–0.7)
HEMATOCRIT: 31.3 % — AB (ref 36.0–46.0)
Hemoglobin: 9.6 g/dL — ABNORMAL LOW (ref 12.0–15.0)
LYMPHS PCT: 10 %
Lymphs Abs: 1.3 10*3/uL (ref 0.7–4.0)
MCH: 25.7 pg — AB (ref 26.0–34.0)
MCHC: 30.7 g/dL (ref 30.0–36.0)
MCV: 83.9 fL (ref 78.0–100.0)
MONOS PCT: 13 %
Monocytes Absolute: 1.7 10*3/uL (ref 0.1–1.0)
NEUTROS ABS: 10.1 10*3/uL (ref 1.7–7.7)
Neutrophils Relative %: 76 %
PLATELETS: 287 10*3/uL (ref 150–400)
RBC: 3.73 MIL/uL — ABNORMAL LOW (ref 3.87–5.11)
RDW: 18 % — ABNORMAL HIGH (ref 11.5–15.5)
WBC: 13.1 10*3/uL — ABNORMAL HIGH (ref 4.0–10.5)

## 2017-07-28 LAB — BRAIN NATRIURETIC PEPTIDE: B Natriuretic Peptide: 1676.9 pg/mL — ABNORMAL HIGH (ref 0.0–100.0)

## 2017-07-28 MED ORDER — FUROSEMIDE 10 MG/ML IJ SOLN
40.0000 mg | Freq: Once | INTRAMUSCULAR | Status: AC
Start: 2017-07-28 — End: 2017-07-28
  Administered 2017-07-28: 40 mg via INTRAVENOUS
  Filled 2017-07-28: qty 4

## 2017-07-28 MED ORDER — HEPARIN SOD (PORK) LOCK FLUSH 100 UNIT/ML IV SOLN
500.0000 [IU] | Freq: Once | INTRAVENOUS | Status: AC
Start: 2017-07-28 — End: 2017-07-28
  Administered 2017-07-28: 500 [IU]
  Filled 2017-07-28: qty 5

## 2017-07-28 MED ORDER — FUROSEMIDE 20 MG PO TABS: 20.0000 mg | ORAL_TABLET | Freq: Every day | ORAL | 0 refills | Status: DC

## 2017-07-28 MED ORDER — SODIUM CHLORIDE 0.9 % IV SOLN
1.0000 g | Freq: Once | INTRAVENOUS | Status: AC
  Administered 2017-07-28: 1 g via INTRAVENOUS
  Filled 2017-07-28: qty 10

## 2017-07-28 NOTE — Progress Notes (Signed)
Right lower extremity venous duplex completed. Preliminary results - There is no evidence of DVT or superficial thrombosis. There is a cystic like structure in the popliteal fossa measuring 1.06 cm x 2.13 cm and extending into the calf 4.76 cm consistent with a possible ruptured [place]'s cyst. Toma Copier, RVS 07/28/2017 3:19 pm

## 2017-07-28 NOTE — ED Notes (Signed)
Unsuccessful IV attempt x2.  

## 2017-07-28 NOTE — ED Notes (Signed)
Documented on the wrong pt. 

## 2017-07-28 NOTE — ED Provider Notes (Signed)
[facility] DEPT Provider Note   CSN: [id number] Arrival date & time: 07/28/17  0854     History   Chief Complaint Chief Complaint  Patient presents with  . Leg Swelling    HPI Tiffany [name] is a 84 y.o. female.  HPI   Feet and legs swollen. Noticed it in hospital, but didn't notice the severity until went to go put on a pair of shoes. Noticed it in the right foot Friday, difficult to get shoes on when leaving the hospital.  By yesterday noted swelling that was in both, right still worse than left.  Has had some shortness of breath, attributes it to being in the hospital sick and not as active as she was.  Feels better after resting. No orthopnea No chest pain. No fevers.  Home health coming out for abx but have not yet.    Past Medical History:  Diagnosis Date  . Arthritis     knees 03-10-12 had Cortisone injection  . Cancer [facility]) june 2016   metastatic  . DDD (degenerative disc disease), cervical   . Edema leg    feet and ankles  . Esophagus disorder    Had esophagus stretched  . GERD (gastroesophageal reflux disease)    Benign stricture dilated in 2011  . Gout    Patient notes she has possible gout but has not been on chronic medications for this  . Headache(784.0)   . Hypertension   . Occipital neuralgia   . Occipital neuralgia    Related to cervical degenerative disc disease  . Peptic ulcer disease    Previous history in the remote past  . Ulcerative proctitis ([place])   . Ulcerative proctitis [facility]) 2014    Patient Active Problem List   Diagnosis Date Noted  . Diarrhea in adult patient 07/17/2017  . Port catheter in place 07/21/2015  . Bronchiolitis 07/01/2015  . Bronchiectasis ([place]) with probable [facility] MAI  06/10/2015  . Liver metastases ([place]) 05/22/2015  . Bone metastases ([place]) 05/22/2015  . Bronchopneumonia 04/29/2015  . Insomnia 04/08/2015  . 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 ABDOMINAL HYSTERECTOMY    . APPENDECTOMY    . CHOLECYSTECTOMY    . COLONOSCOPY WITH PROPOFOL  03/25/2012   Procedure: COLONOSCOPY WITH PROPOFOL;  Surgeon: Garlan Fair, MD;  Location: [facility] ENDOSCOPY;  Service: Endoscopy;  Laterality: N/A;  . DILATION AND CURETTAGE OF UTERUS    . ESOPHAGEAL DILATION     For benign stricture in 2011  . ESOPHAGOGASTRODUODENOSCOPY    . FLEXIBLE SIGMOIDOSCOPY N/A 04/26/2014   Procedure: FLEXIBLE SIGMOIDOSCOPY - UnSedated;  Surgeon: Garlan Fair, MD;  Location: [facility] ENDOSCOPY;  Service: Endoscopy;  Laterality: N/A;  . FLEXIBLE SIGMOIDOSCOPY N/A 01/24/2015   Procedure: FLEXIBLE SIGNMOIDOSCOPY W/ FLEET ENEMIA;  Surgeon: Garlan Fair, MD;  Location: [facility] ENDOSCOPY;  Service: Endoscopy;  Laterality: N/A;  . FLEXIBLE SIGMOIDOSCOPY N/A 05/15/2016   Procedure: FLEXIBLE SIGMOIDOSCOPY;  Surgeon: Garlan Fair, MD;  Location: [facility] ENDOSCOPY;  Service: Endoscopy;  Laterality: N/A;  pt needs to ahve an enema upon arrival   . NECK SURGERY     20 years ago  . TOTAL ABDOMINAL HYSTERECTOMY W/ BILATERAL SALPINGOOPHORECTOMY     At age 59 due to miscarriage and retained products of conception causing significant uterine bleeding. Patient has been on estrogen replacement therapy since then.     OB History  None      Home Medications    Prior to Admission medications   Medication Sig Start Date End Date Taking? Authorizing Provider  acetaminophen (TYLENOL) 650 MG CR tablet Take 1,300 mg by mouth 2 (two) times daily.    [provider]  amLODipine (NORVASC) 5 MG tablet Take 5 mg by mouth daily.    [provider]  benzonatate (TESSALON) 100 MG capsule Take 1 capsule (100 mg total) by mouth 3 (three) times daily as needed for cough. 05/03/17   [name], MD  budesonide (ENTOCORT EC) 3 MG 24 hr capsule Take 1 capsule (3 mg total) by mouth daily. Take once daily for 7-10 days then every other day for 10  days then stop if no GI bleeding. 07/04/17   [name], MD  cefTRIAXone (ROCEPHIN) IVPB Inject 2 g into the vein daily. Indication:  bacteremia Last Day of Therapy:  08/02/17 Labs - Once weekly:  CBC/D and BMP, Labs - Every other week:  ESR and CRP 07/26/17   Aline August, MD  diclofenac sodium (VOLTAREN) 1 % GEL APPLY 2 TO 4 GRAMS Q 6 H PRN FOR PAIN 06/20/17   [provider]  estradiol (ESTRACE) 0.5 MG tablet Take 0.5 mg by mouth daily. 08/02/14   [provider]  feeding supplement, ENSURE ENLIVE, (ENSURE ENLIVE) LIQD Take 237 mLs by mouth 2 (two) times daily between meals. 07/26/17   Aline August, MD  furosemide (LASIX) 20 MG tablet Take 1 tablet (20 mg total) by mouth daily for 3 days. 07/28/17 07/31/17  Gareth Morgan, MD  iron polysaccharides (NIFEREX) 150 MG capsule Take 1 capsule (150 mg total) by mouth daily. 07/04/17   [name], MD  mirtazapine (REMERON) 15 MG tablet TAKE 1 TABLET(15 MG) BY MOUTH AT BEDTIME 06/20/17   [name], MD  ondansetron (ZOFRAN ODT) 4 MG disintegrating tablet Take 1 tablet (4 mg total) by mouth every 8 (eight) hours as needed for nausea or vomiting. 07/26/17   Aline August, MD  PRESCRIPTION MEDICATION Chemo card    [provider]  Respiratory Therapy Supplies (FLUTTER) [place] 1 each by Does not apply route daily. 07/06/15   Tanda Rockers, MD  triamcinolone ointment (KENALOG) 0.5 % Apply 1 application topically 2 (two) times daily. 05/03/17   [name], MD  vitamin B-12 (CYANOCOBALAMIN) 100 MCG tablet Take 100 mcg by mouth daily.    [provider]    Family History Family History  Problem Relation Age of Onset  . Lung cancer Sister   . Prostate cancer Brother   . Uterine cancer Maternal Aunt   . Breast cancer Paternal Grandmother   . Hodgkin's lymphoma Sister     Social History Social History   Tobacco Use  . Smoking status: Former Smoker    Last attempt to quit: 11/01/1983     Years since quitting: 33.7  . Smokeless tobacco: Never Used  Substance Use Topics  . Alcohol use: Yes    Comment: occ  . Drug use: No     Allergies   Nitrofurantoin; Indomethacin; and Lisinopril   Review of Systems Review of Systems  Constitutional: Positive for fatigue. Negative for fever.  HENT: Negative for sore throat.   Eyes: Negative for visual disturbance.  Respiratory: Positive for shortness of breath. Negative for cough.   Cardiovascular: Positive for leg swelling. Negative for chest pain.  Gastrointestinal: Negative for abdominal pain, nausea and vomiting.  Genitourinary: Negative for difficulty  urinating.  Musculoskeletal: Negative for back pain and neck pain.  Skin: Negative for rash.  Neurological: Negative for syncope and headaches.     Physical Exam Updated Vital Signs BP 117/61   Pulse 82   Temp 98 F (36.7 C) (Oral)   Resp (!) 21   SpO2 93%   Physical Exam  Constitutional: She is oriented to person, place, and time. She appears well-developed and well-nourished. No distress.  HENT:  Head: Normocephalic and atraumatic.  Eyes: Conjunctivae and EOM are normal.  Neck: Normal range of motion.  Cardiovascular: Normal rate, regular rhythm, normal heart sounds and intact distal pulses. Exam reveals no gallop and no friction rub.  No murmur heard. Pulmonary/Chest: Effort normal and breath sounds normal. No respiratory distress. She has no wheezes. She has no rales.  Abdominal: Soft. She exhibits no distension. There is no tenderness. There is no guarding.  Musculoskeletal: She exhibits edema (2-3_ bilaterally). She exhibits no tenderness.  Neurological: She is alert and oriented to person, place, and time.  Skin: Skin is warm and dry. No rash noted. She is not diaphoretic. No erythema.  Nursing note and vitals reviewed.    ED Treatments / Results  Labs (all labs ordered are listed, but only abnormal results are displayed) Labs Reviewed  BRAIN  NATRIURETIC PEPTIDE - Abnormal; Notable for the following components:      Result Value   B Natriuretic Peptide 1,676.9 (*)    All other components within normal limits  CBC WITH DIFFERENTIAL/PLATELET - Abnormal; Notable for the following components:   WBC 13.1 (*)    RBC 3.73 (*)    Hemoglobin 9.6 (*)    HCT 31.3 (*)    MCH 25.7 (*)    RDW 18.0 (*)    All other components within normal limits  COMPREHENSIVE METABOLIC PANEL - Abnormal; Notable for the following components:   Calcium 7.7 (*)    Total Protein 6.4 (*)    Albumin 2.2 (*)    GFR calc non Af Amer 60 (*)    All other components within normal limits    EKG EKG Interpretation  Date/Time:  Sunday July 28 2017 13:47:06 EDT Ventricular Rate:  80 PR Interval:    QRS Duration: 55 QT Interval:  430 QTC Calculation: 497 R Axis:   -22 Text Interpretation:  Sinus rhythm Atrial premature complex Borderline left axis deviation Anterior infarct, old Confirmed by Tanna Furry [phone]) on 07/28/2017 1:54:33 PM Also confirmed by Tanna Furry [phone]), editor Lynder Parents [phone])  on 07/28/2017 2:30:49 PM   Radiology Dg Chest 2 View  Result Date: 07/28/2017 CLINICAL DATA:  Leg and feet swelling for several days. EXAM: CHEST - 2 VIEW COMPARISON:  For 18 19. FINDINGS: Heart size is within normal limits. Port-A-Cath good position. LEFT pleural effusion, with indeterminate retrocardiac density. Small RIGHT effusion. Worsening aeration from priors. IMPRESSION: Normal heart size with interval development of LEFT base process of uncertain etiology. Moderate LEFT pleural effusion is new from priors. Electronically Signed   By: Staci Righter M.D.   On: 07/28/2017 14:34    Procedures Procedures (including critical care time)  Medications Ordered in ED Medications  cefTRIAXone (ROCEPHIN) 1 g in sodium chloride 0.9 % 100 mL IVPB (0 g Intravenous Stopped 07/28/17 1449)  furosemide (LASIX) injection 40 mg (40 mg Intravenous Given 07/28/17 1515)    heparin lock flush 100 unit/mL (500 Units Intracatheter Given 07/28/17 1546)     Initial Impression / Assessment and Plan / ED Course  I have reviewed the triage vital signs and the nursing notes.  Pertinent labs & imaging results that were available during my care of the patient were reviewed by me and considered in my medical decision making (see chart for details).     84 yo female with recent history of admission for UTI presents with concern for right greater than left extremity swelling. Notes mild dyspnea on exertion since returning home that she attributes to deconditioning.  DVT study of RLE without thrombosis. XR without pulmonary edema. Given dose of home rocephin as she will be missing home health today. No concern for acute worsening of infection. Denies chest pain and denies significant dypnea. No continuous symptoms, overall low susp for PE.  BNP elevated to 1600s, concern for CHF. No chest pain, no current dyspnea at rest, no hypoxia, no increased work of breathing and patient appropriate for outpatient evaluation and work up. Likely symptoms exacerbated by fluids received while in the hospital.  Given IV lasix in the ED and will rx for 3 days. Recommend PCP and Cardiology follow up for continued eval. Patient discharged in stable condition with understanding of reasons to return.   Final Clinical Impressions(s) / ED Diagnoses   Final diagnoses:  Peripheral edema  Elevated brain natriuretic peptide (BNP) level  Congestive heart failure, unspecified HF chronicity, unspecified heart failure type [facility])    ED Discharge Orders        Ordered    furosemide (LASIX) 20 MG tablet  Daily     07/28/17 1533       Gareth Morgan, MD 07/28/17 2225

## 2017-07-28 NOTE — ED Notes (Signed)
Bed: WA02 Expected date:  Expected time:  Means of arrival:  Comments: 

## 2017-07-28 NOTE — ED Notes (Signed)
Patient transported to X-ray 

## 2017-07-28 NOTE — ED Triage Notes (Signed)
Pt reports she has leg and feet swelling for several days. Got prescription filled on Friday for Rocephin to be given in vein daily. States that she spoke with nursing director over hospital and someone will be calling her to come out but doesn't know who or when.

## 2017-07-29 DIAGNOSIS — C659 Malignant neoplasm of unspecified renal pelvis: Secondary | ICD-10-CM | POA: Diagnosis not present

## 2017-07-29 DIAGNOSIS — N183 Chronic kidney disease, stage 3 (moderate): Secondary | ICD-10-CM | POA: Diagnosis not present

## 2017-07-29 DIAGNOSIS — Z87891 Personal history of nicotine dependence: Secondary | ICD-10-CM | POA: Diagnosis not present

## 2017-07-29 DIAGNOSIS — M109 Gout, unspecified: Secondary | ICD-10-CM | POA: Diagnosis not present

## 2017-07-29 DIAGNOSIS — D631 Anemia in chronic kidney disease: Secondary | ICD-10-CM | POA: Diagnosis not present

## 2017-07-29 DIAGNOSIS — M5090 Cervical disc disorder, unspecified, unspecified cervical region: Secondary | ICD-10-CM | POA: Diagnosis not present

## 2017-07-29 DIAGNOSIS — C787 Secondary malignant neoplasm of liver and intrahepatic bile duct: Secondary | ICD-10-CM | POA: Diagnosis not present

## 2017-07-29 DIAGNOSIS — K219 Gastro-esophageal reflux disease without esophagitis: Secondary | ICD-10-CM | POA: Diagnosis not present

## 2017-07-29 DIAGNOSIS — I129 Hypertensive chronic kidney disease with stage 1 through stage 4 chronic kidney disease, or unspecified chronic kidney disease: Secondary | ICD-10-CM | POA: Diagnosis not present

## 2017-07-29 DIAGNOSIS — K5190 Ulcerative colitis, unspecified, without complications: Secondary | ICD-10-CM | POA: Diagnosis not present

## 2017-07-29 DIAGNOSIS — A020 Salmonella enteritis: Secondary | ICD-10-CM | POA: Diagnosis not present

## 2017-07-29 DIAGNOSIS — E43 Unspecified severe protein-calorie malnutrition: Secondary | ICD-10-CM | POA: Diagnosis not present

## 2017-07-29 DIAGNOSIS — K5120 Ulcerative (chronic) proctitis without complications: Secondary | ICD-10-CM | POA: Diagnosis not present

## 2017-08-05 DIAGNOSIS — C689 Malignant neoplasm of urinary organ, unspecified: Secondary | ICD-10-CM | POA: Diagnosis not present

## 2017-08-05 DIAGNOSIS — R7881 Bacteremia: Secondary | ICD-10-CM | POA: Diagnosis not present

## 2017-08-05 DIAGNOSIS — R600 Localized edema: Secondary | ICD-10-CM | POA: Diagnosis not present

## 2017-08-05 DIAGNOSIS — A020 Salmonella enteritis: Secondary | ICD-10-CM | POA: Diagnosis not present

## 2017-08-07 LAB — CULTURE, BLOOD (ROUTINE X 2): Special Requests: ADEQUATE

## 2017-08-12 ENCOUNTER — Telehealth: Payer: Self-pay | Admitting: *Deleted

## 2017-08-12 DIAGNOSIS — Z5181 Encounter for therapeutic drug level monitoring: Secondary | ICD-10-CM | POA: Diagnosis not present

## 2017-08-12 DIAGNOSIS — R609 Edema, unspecified: Secondary | ICD-10-CM | POA: Diagnosis not present

## 2017-08-12 DIAGNOSIS — R531 Weakness: Secondary | ICD-10-CM | POA: Diagnosis not present

## 2017-08-12 DIAGNOSIS — J471 Bronchiectasis with (acute) exacerbation: Secondary | ICD-10-CM | POA: Diagnosis not present

## 2017-08-12 NOTE — Telephone Encounter (Signed)
Patient called and stated, "I just got out of the hospital. I need someone who understands my case to help me understand my health versus my cancer treatments. I would like to continue treatments. Also, I need to change my appointments on Thursday, May 16th, til later in the morning. It's hard for [place] to get going in the morning." I told her that I would work on her schedule for Thursday. She verbalized understanding.

## 2017-08-13 ENCOUNTER — Telehealth: Payer: Self-pay | Admitting: Hematology

## 2017-08-13 NOTE — Telephone Encounter (Signed)
Rescheduled appts per 5/13 sch msg - spoke w/ pt re appts.

## 2017-08-14 ENCOUNTER — Ambulatory Visit (INDEPENDENT_AMBULATORY_CARE_PROVIDER_SITE_OTHER): Payer: Medicare Other | Admitting: Internal Medicine

## 2017-08-14 VITALS — BP 138/67 | HR 83 | Temp 97.8°F | Wt 121.0 lb

## 2017-08-14 DIAGNOSIS — A020 Salmonella enteritis: Secondary | ICD-10-CM | POA: Diagnosis not present

## 2017-08-14 DIAGNOSIS — R7881 Bacteremia: Secondary | ICD-10-CM | POA: Diagnosis not present

## 2017-08-14 NOTE — Progress Notes (Signed)
Hematology oncology clinic follow-up.  Date of service:  08/15/17  Patient Care Team: [name], MD as PCP - General (Internal Medicine)   Urologist: Dr. 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  Treatment -Atezolizumab IV q3weeks  Delton See q12weeks for bone mets  HISTORY OF PRESENTING ILLNESS: Please see my previous notes for details of initial presentation.  INTERVAL HISTORY:   Tiffany [name] is here for her scheduled follow-up and cycle 46 treatment with Atezolizumab. The patient's last visit with [place] was on 07/04/17. She is accompanied today by her husband. The pt reports that she is doing well overall.   The pt reports that her diarrhea resolved after being discharged from the hospital. On 07/17/17 she was seen for salmonella enteritis and bacteremia, and has been taking antibiotics. She notes that her appetite has not yet returned to normal strength but is getting beter.   She notes that she will be seeing a cardiologist on 09/15/17, as advised upon her discharge.   Of note since the patient's last visit, pt has had a blood transfusion completed on 07/27/17.  Lab results today (08/15/17) of CBC is as follows: all values are WNL except for RBC at 3.27, Hgb at 8.3, HCT at 27.7, MCHC at 30.0, RDW at 18.0, Platelets at 414k, Monocytes Abs at 1.0k. CMP 08/15/17 is WNL , albumin improving 2.2--> 2.7 TSH 08/15/17 is 0.747  On review of systems, pt reports normal bowel movements, remaining ankle swelling, and denies abdominal pains, diarrhea, constipation, blood in the stools, new bone pains, back pains, coughing, and any other symptoms.      MEDICAL HISTORY:  Past Medical History:  Diagnosis Date  . Arthritis     knees 03-10-12 had Cortisone injection  . Cancer [facility]) june  2016   metastatic  . DDD (degenerative disc disease), cervical   . Edema leg    feet and ankles  . Esophagus disorder    Had esophagus stretched  . GERD (gastroesophageal reflux disease)    Benign stricture dilated in 2011  . Gout    Patient notes she has possible gout but has not been on chronic medications for this  . Headache(784.0)   . Hypertension   . Occipital neuralgia   . Occipital neuralgia    Related to cervical degenerative disc disease  . Peptic ulcer disease    Previous history in the remote past  . Ulcerative proctitis ([place])   . Ulcerative proctitis ([place]) 2014    SURGICAL HISTORY: Past Surgical History:  Procedure Laterality Date  . ABDOMINAL HYSTERECTOMY    . APPENDECTOMY    . CHOLECYSTECTOMY    . COLONOSCOPY WITH PROPOFOL  03/25/2012   Procedure: COLONOSCOPY WITH PROPOFOL;  Surgeon: Garlan Fair, MD;  Location: [facility]COPY;  Service: Endoscopy;  Laterality: N/A;  . DILATION AND CURETTAGE OF UTERUS    . ESOPHAGEAL DILATION     For benign stricture in 2011  . ESOPHAGOGASTRODUODENOSCOPY    . FLEXIBLE SIGMOIDOSCOPY N/A 04/26/2014   Procedure: FLEXIBLE SIGMOIDOSCOPY - UnSedated;  Surgeon: Garlan Fair, MD;  Location: [facility];  Service: Endoscopy;  Laterality: N/A;  . FLEXIBLE SIGMOIDOSCOPY N/A 01/24/2015   Procedure: FLEXIBLE SIGNMOIDOSCOPY W/ FLEET ENEMIA;  Surgeon: Garlan Fair, MD;  Location: [facility];  Service: Endoscopy;  Laterality: N/A;  . FLEXIBLE  SIGMOIDOSCOPY N/A 05/15/2016   Procedure: FLEXIBLE SIGMOIDOSCOPY;  Surgeon: Garlan Fair, MD;  Location: [facility];  Service: Endoscopy;  Laterality: N/A;  pt needs to ahve an enema upon arrival   . NECK SURGERY     20 years ago  . TOTAL ABDOMINAL HYSTERECTOMY W/ BILATERAL SALPINGOOPHORECTOMY     At age 43 due to miscarriage and retained products of conception causing significant uterine bleeding. Patient has been on estrogen replacement therapy since then.    SOCIAL  HISTORY: Social History   Socioeconomic History  . Marital status: Married    Spouse name: Not on file  . Number of children: Not on file  . Years of education: Not on file  . Highest education level: Not on file  Occupational History  . Not on file  Social Needs  . Financial resource strain: Not on file  . Food insecurity:    Worry: Not on file    Inability: Not on file  . Transportation needs:    Medical: Not on file    Non-medical: Not on file  Tobacco Use  . Smoking status: Former Smoker    Last attempt to quit: 11/01/1983    Years since quitting: 33.8  . Smokeless tobacco: Never Used  Substance and Sexual Activity  . Alcohol use: Yes    Comment: occ  . Drug use: No  . Sexual activity: Never  Lifestyle  . Physical activity:    Days per week: Not on file    Minutes per session: Not on file  . Stress: Not on file  Relationships  . Social connections:    Talks on phone: Not on file    Gets together: Not on file    Attends religious service: Not on file    Active member of club or organization: Not on file    Attends meetings of clubs or organizations: Not on file    Relationship status: Not on file  . Intimate partner violence:    Fear of current or ex partner: Not on file    Emotionally abused: Not on file    Physically abused: Not on file    Forced sexual activity: Not on file  Other Topics Concern  . Not on file  Social History Narrative  . Not on file  Retired as Conservation officer, nature for [employer]. She is very well spoken and intelligent individual and exhibits a keen knowledge of her medical conditions.  FAMILY HISTORY: Family History  Problem Relation Age of Onset  . Lung cancer Sister   . Prostate cancer Brother   . Uterine cancer Maternal Aunt   . Breast cancer Paternal Grandmother   . Hodgkin's lymphoma Sister     ALLERGIES:  is allergic to nitrofurantoin; indomethacin; and lisinopril.  MEDICATIONS:  Current Outpatient Medications   Medication Sig Dispense Refill  . acetaminophen (TYLENOL) 650 MG CR tablet Take 1,300 mg by mouth 2 (two) times daily.    Marland Kitchen amLODipine (NORVASC) 5 MG tablet Take 5 mg by mouth daily.    . benzonatate (TESSALON) 100 MG capsule Take 1 capsule (100 mg total) by mouth 3 (three) times daily as needed for cough. 90 capsule 2  . budesonide (ENTOCORT EC) 3 MG 24 hr capsule Take 1 capsule (3 mg total) by mouth daily. Take once daily for 7-10 days then every other day for 10 days then stop if no GI bleeding. 30 capsule 1  . cefTRIAXone (ROCEPHIN) IVPB Inject 2 g into the vein  daily. Indication:  bacteremia Last Day of Therapy:  08/02/17 Labs - Once weekly:  CBC/D and BMP, Labs - Every other week:  ESR and CRP 9 Units 0  . diclofenac sodium (VOLTAREN) 1 % GEL APPLY 2 TO 4 GRAMS Q 6 H PRN FOR PAIN  0  . estradiol (ESTRACE) 0.5 MG tablet Take 0.5 mg by mouth daily.  1  . feeding supplement, ENSURE ENLIVE, (ENSURE ENLIVE) LIQD Take 237 mLs by mouth 2 (two) times daily between meals. 237 mL 12  . furosemide (LASIX) 20 MG tablet Take 1 tablet (20 mg total) by mouth daily for 3 days. 3 tablet 0  . iron polysaccharides (NIFEREX) 150 MG capsule Take 1 capsule (150 mg total) by mouth daily. 30 capsule 1  . mirtazapine (REMERON) 15 MG tablet TAKE 1 TABLET(15 MG) BY MOUTH AT BEDTIME 90 tablet 0  . ondansetron (ZOFRAN ODT) 4 MG disintegrating tablet Take 1 tablet (4 mg total) by mouth every 8 (eight) hours as needed for nausea or vomiting. (Patient not taking: Reported on 08/14/2017) 20 tablet 0  . PRESCRIPTION MEDICATION Chemo card    . Respiratory Therapy Supplies (FLUTTER) DEVI 1 each by Does not apply route daily. 1 each 0  . triamcinolone ointment (KENALOG) 0.5 % Apply 1 application topically 2 (two) times daily. 30 g 0  . vitamin B-12 (CYANOCOBALAMIN) 100 MCG tablet Take 100 mcg by mouth daily.     No current facility-administered medications for this visit.    Facility-Administered Medications Ordered in Other  Visits  Medication Dose Route Frequency Provider Last Rate Last Dose  . sodium chloride 0.9 % injection 10 mL  10 mL Intravenous PRN [name], MD   10 mL at 02/28/17 1602   REVIEW OF SYSTEMS:   A 10+ POINT REVIEW OF SYSTEMS WAS OBTAINED including neurology, dermatology, psychiatry, cardiac, respiratory, lymph, extremities, GI, GU, Musculoskeletal, constitutional, breasts, reproductive, HEENT.  All pertinent positives are noted in the HPI.  All others are negative.    PHYSICAL EXAMINATION: ECOG PERFORMANCE STATUS: 2  Vitals:   08/15/17 1228  BP: 133/72  Pulse: 85  Resp: 16  Temp: 98.2 F (36.8 C)  SpO2: 97%   Filed Weights   08/15/17 1228  Weight: 121 lb 8 oz (55.1 kg)     GENERAL:alert, in no acute distress and comfortable SKIN: no acute rashes, no significant lesions EYES: conjunctiva are pink and non-injected, sclera anicteric OROPHARYNX: MMM, no exudates, no oropharyngeal erythema or ulceration NECK: supple, no JVD LYMPH:  no palpable lymphadenopathy in the cervical, axillary or inguinal regions LUNGS: clear to auscultation b/l with normal respiratory effort HEART: regular rate & rhythm ABDOMEN:  normoactive bowel sounds , non tender, not distended. Extremity: no pedal edema PSYCH: alert & oriented x 3 with fluent speech NEURO: no focal motor/sensory deficits   LABORATORY DATA:   CBC Latest Ref Rng & Units 08/15/2017 07/28/2017 07/26/2017  WBC 3.9 - 10.3 K/uL 6.8 13.1(H) 14.8(H)  Hemoglobin 11.6 - 15.9 g/dL 8.3(L) 9.6(L) 8.0(L)  Hematocrit 34.8 - 46.6 % 27.7(L) 31.3(L) 25.9(L)  Platelets 145 - 400 K/uL 414(H) 287 295   . CBC    Component Value Date/Time   WBC 6.8 08/15/2017 1121   WBC 13.1 (H) 07/28/2017 1358   RBC 3.27 (L) 08/15/2017 1121   RBC 3.27 (L) 08/15/2017 1121   HGB 8.3 (L) 08/15/2017 1121   HGB 9.0 (L) 03/21/2017 1352   HCT 27.7 (L) 08/15/2017 1121   HCT 29.3 (L) 03/21/2017  1352   PLT 414 (H) 08/15/2017 1121   PLT 311 03/21/2017 1352     MCV 84.7 08/15/2017 1121   MCV 90.7 03/21/2017 1352   MCH 25.4 08/15/2017 1121   MCHC 30.0 (L) 08/15/2017 1121   RDW 18.0 (H) 08/15/2017 1121   RDW 16.3 (H) 03/21/2017 1352   LYMPHSABS 1.9 08/15/2017 1121   LYMPHSABS 1.6 03/21/2017 1352   MONOABS 1.0 (H) 08/15/2017 1121   MONOABS 0.6 03/21/2017 1352   EOSABS 0.1 08/15/2017 1121   EOSABS 0.1 03/21/2017 1352   BASOSABS 0.0 08/15/2017 1121   BASOSABS 0.0 03/21/2017 1352      CMP Latest Ref Rng & Units 08/15/2017 07/28/2017 07/26/2017  Glucose 70 - 140 mg/dL 95 86 111(H)  BUN 7 - 26 mg/dL 15 12 10   Creatinine 0.60 - 1.10 mg/dL 0.85 0.83 0.94  Sodium 136 - 145 mmol/L 137 138 138  Potassium 3.5 - 5.1 mmol/L 4.3 4.5 3.6  Chloride 98 - 109 mmol/L 104 107 110  CO2 22 - 29 mmol/L 26 22 20(L)  Calcium 8.4 - 10.4 mg/dL 9.3 7.7(L) 6.7(L)  Total Protein 6.4 - 8.3 g/dL 7.3 6.4(L) -  Total Bilirubin 0.2 - 1.2 mg/dL 0.2 1.0 -  Alkaline Phos 40 - 150 U/L 57 55 -  AST 5 - 34 U/L 16 31 -  ALT 0 - 55 U/L 8 19 -   . Lab Results  Component Value Date   LDH 228 05/11/2016    Radiology .Dg Chest 2 View  Result Date: 07/28/2017 CLINICAL DATA:  Leg and feet swelling for several days. EXAM: CHEST - 2 VIEW COMPARISON:  For 18 19. FINDINGS: Heart size is within normal limits. Port-A-Cath good position. LEFT pleural effusion, with indeterminate retrocardiac density. Small RIGHT effusion. Worsening aeration from priors. IMPRESSION: Normal heart size with interval development of LEFT base process of uncertain etiology. Moderate LEFT pleural effusion is new from priors. Electronically Signed   By: Staci Righter M.D.   On: 07/28/2017 14:34   Dg Chest 2 View  Result Date: 07/18/2017 CLINICAL DATA:  Fever EXAM: CHEST - 2 VIEW COMPARISON:  Chest x-ray dated 05/01/2015. FINDINGS: Heart size and mediastinal contours are within normal limits. Lungs are clear. No pleural effusion or pneumothorax seen. No acute or suspicious osseous finding. RIGHT chest wall  Port-A-Cath is stable in position with tip at the level of the mid/lower SVC. IMPRESSION: No active cardiopulmonary disease. No evidence of pneumonia or pulmonary edema. Electronically Signed   By: Franki Cabot M.D.   On: 07/18/2017 21:45    ASSESSMENT & PLAN:   83 year old Caucasian female with  #1 Right Renal pelvis metastatic transitional cell carcinoma.   Initial PET scan revealed metastases to the lungs, liver, multiple nodal stations and bones. -PET scan done after 8 cycles of treatment for restaging disease showed significant response to treatment with no evidence of active disease. Patient has no clinical evidence of disease progression at this time. CT chest 06/16/2015 showed no evidence of cancer progression. PET/CT scan done on 10/21/2015- showed no findings for metastatic disease. New right-sided hydronephrosis versus local tumor recurrence needs to be evaluated further. MRI of the kidney showed hyperenhancing the right renal pelvis lesion consistent with concern for local recurrence. -PET/CT 03/06/2016 with no findings of metastatic disease. Extensive right-sided hydronephrosis with high activity from excreted FDG obscuring any findings along the collecting system. -PET/CT  06/13/2016- Interval increase in size of right kidney. Findings are suspicious for progression of residual/recurrent urothelial carcinoma  with further dilatation of the right renal collecting system. 2. No evidence for hypermetabolic metastases.  -PET/CT 10/22/2016 - Continued interval progression of the abnormal soft tissue associated with the right kidney. This soft tissue remains markedly hypermetabolic and is compatible with continued progression of recurrent right renal disease. 2. No evidence for hypermetabolic metastases in the neck, chest, abdomen, or pelvis.  PET/CT 04/04/2017-  Interval enlargement of and increase in hypermetabolism within the right renal mass, consistent with residual/recurrent disease.  Increase in disease progression along the proximal right ureter. 2. No evidence of abdominopelvic nodal or extra abdominal metastatic disease.   #2 Bone metastases  Plan  -Discussed pt labwork today, 08/15/17; Hgb at 8.3 today -Will recheck iron levels at next visit -Will repeat PET scan -She would like to wait on a blood transfusion until seeing her blood counts her next cycle -Pt will let us know if she develops any increasing fatigue, weakness, light headedness, or dizziness -The pt has no prohibitive toxicities from continuing maintenance therapy at this time.   -She is still mostly asymptomatic and responding well to treatment.  -continue Atezolizumab q3 weeks as per orders. -continue Xgeva to q12 weeks since she has completed 2 yrs of this treatment already. -transfuse prn for hgb <8 or if symptomatic.  #2 h/o skin rash likely related to her Atezolizumab. Minimal grade 1 and controlled with topical triamcinolone. Unchanged today.  #3 chronic bronchiectasis with Cough and some clear secretions likely due to bronchiectasis. Followed by Dr Melvyn Novas. -No chest pain no increased shortness of breath or dyspnea on exertion  -Has an action plan with Dr. Melvyn Novas to use when necessary antibiotics for worsening symptoms. -Continue follow-up with Dr. 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 Wynetta Emery.  But stopped using it due to cost issues. Was then on PO budesonide which she has stopped as well. last sigmoidscopy with Dr Wynetta Emery - showed stable ulcerative proctitis  -transfuse prn for hgb<8. Does not report overt rectal bleeding at this time.  #5 Recent Salmonella enteritis -- resolved. diarrhoea resolved. --monitor -completed antibiotics.  #5 history of hypertension   -on Amlodipine    PET/CT in 18 days Please schedule  next 2 cycle of Atezolizumab q3weeks with labs RTC with Dr Irene Limbo in 3 weeks with next treatment and PET    All of the patients questions were answered with apparent satisfaction. The patient knows to call the clinic with any problems, questions or concerns.  . The total time spent in the appointment was 15 minutes and more than 50% was on counseling and direct patient cares.    Sullivan Lone MD [facility] Hematology/Oncology Physician [facility]  (Office):       [phone] (Work cell):  [phone] (Fax):           [phone]  This document serves as a record of services personally performed by Sullivan Lone, MD. It was created on his behalf by Baldwin [place], a trained medical scribe. The creation of this record is based on the scribe's personal observations and the provider's statements to them.   .I have reviewed the above documentation for accuracy and completeness, and I agree with the above. [name] MD

## 2017-08-14 NOTE — Progress Notes (Signed)
Patient ID: Tiffany [name], female   DOB: Aug 16, 1924, 84 y.o.   MRN: [id number]  HPI 84 year old female with history of transitional cell carcinoma of the renal pelvis, liver metastasis, hypertension presented on 07/17/2017 for generalized weakness and diarrhea. She also had drop in her hemoglobin. GI was consulted. C. difficile was negative. She was found to have salmonella enteritis and bacteremia. ID was consulted. Her antibiotics were switched to IV Rocephin.  ID recommended 2 weeks of intravenous Rocephin.  She will be discharged home with home health on intravenous antibiotics.  Finished iv abtx. Doing well. She doesn't know why she is here. She does recall having possibly undercooked chicken prior to admit.  Denies fevers, chills, nithsweats, back pain, diarreha  Outpatient Encounter Medications as of 08/14/2017  Medication Sig  . acetaminophen (TYLENOL) 650 MG CR tablet Take 1,300 mg by mouth 2 (two) times daily.  Marland Kitchen amLODipine (NORVASC) 5 MG tablet Take 5 mg by mouth daily.  . benzonatate (TESSALON) 100 MG capsule Take 1 capsule (100 mg total) by mouth 3 (three) times daily as needed for cough.  . budesonide (ENTOCORT EC) 3 MG 24 hr capsule Take 1 capsule (3 mg total) by mouth daily. Take once daily for 7-10 days then every other day for 10 days then stop if no GI bleeding.  . cefTRIAXone (ROCEPHIN) IVPB Inject 2 g into the vein daily. Indication:  bacteremia Last Day of Therapy:  08/02/17 Labs - Once weekly:  CBC/D and BMP, Labs - Every other week:  ESR and CRP  . diclofenac sodium (VOLTAREN) 1 % GEL APPLY 2 TO 4 GRAMS Q 6 H PRN FOR PAIN  . estradiol (ESTRACE) 0.5 MG tablet Take 0.5 mg by mouth daily.  . feeding supplement, ENSURE ENLIVE, (ENSURE ENLIVE) LIQD Take 237 mLs by mouth 2 (two) times daily between meals.  . iron polysaccharides (NIFEREX) 150 MG capsule Take 1 capsule (150 mg total) by mouth daily.  . mirtazapine (REMERON) 15 MG tablet TAKE 1 TABLET(15 MG) BY MOUTH AT  BEDTIME  . PRESCRIPTION MEDICATION Chemo card  . Respiratory Therapy Supplies (FLUTTER) DEVI 1 each by Does not apply route daily.  Marland Kitchen triamcinolone ointment (KENALOG) 0.5 % Apply 1 application topically 2 (two) times daily.  . vitamin B-12 (CYANOCOBALAMIN) 100 MCG tablet Take 100 mcg by mouth daily.  . furosemide (LASIX) 20 MG tablet Take 1 tablet (20 mg total) by mouth daily for 3 days.  . ondansetron (ZOFRAN ODT) 4 MG disintegrating tablet Take 1 tablet (4 mg total) by mouth every 8 (eight) hours as needed for nausea or vomiting. (Patient not taking: Reported on 08/14/2017)   Facility-Administered Encounter Medications as of 08/14/2017  Medication  . sodium chloride 0.9 % injection 10 mL     Patient Active Problem List   Diagnosis Date Noted  . Diarrhea in adult patient 07/17/2017  . Port catheter in place 07/21/2015  . Bronchiolitis 07/01/2015  . Bronchiectasis ([place]) with probable Assoc MAI  06/10/2015  . Liver metastases ([place]) 05/22/2015  . Bone metastases ([place]) 05/22/2015  . Bronchopneumonia 04/29/2015  . Insomnia 04/08/2015  . Transitional cell carcinoma of renal pelvis ([place]) 11/22/2014     Health Maintenance Due  Topic Date Due  . TETANUS/TDAP  05/06/1945  . DEXA SCAN  05/07/1991    Social History   Tobacco Use  . Smoking status: Former Smoker    Last attempt to quit: 11/01/1983    Years since quitting: 33.8  . Smokeless tobacco: Never  Used  Substance Use Topics  . Alcohol use: Yes    Comment: occ  . Drug use: No  family history includes Breast cancer in her paternal grandmother; Hodgkin's lymphoma in her sister; Lung cancer in her sister; Prostate cancer in her brother; Uterine cancer in her maternal aunt. Review of Systems Review of Systems  Constitutional: Negative for fever, chills, diaphoresis, activity change, appetite change, fatigue and unexpected weight change.  HENT: Negative for congestion, sore throat, rhinorrhea, sneezing, trouble swallowing and sinus  pressure.  Eyes: Negative for photophobia and visual disturbance.  Respiratory: Negative for cough, chest tightness, shortness of breath, wheezing and stridor.  Cardiovascular: Negative for chest pain, palpitations and leg swelling.  Gastrointestinal: Negative for nausea, vomiting, abdominal pain, diarrhea, constipation, blood in stool, abdominal distention and anal bleeding.  Genitourinary: Negative for dysuria, hematuria, flank pain and difficulty urinating.  Musculoskeletal: Negative for myalgias, back pain, joint swelling, arthralgias and gait problem.  Skin: Negative for color change, pallor, rash and wound.  Neurological: Negative for dizziness, tremors, weakness and light-headedness.  Hematological: Negative for adenopathy. Does not bruise/bleed easily.  Psychiatric/Behavioral: Negative for behavioral problems, confusion, sleep disturbance, dysphoric mood, decreased concentration and agitation.    Physical Exam   BP 138/67   Pulse 83   Temp 97.8 F (36.6 C) (Oral)   Wt 121 lb (54.9 kg)   BMI 22.87 kg/m   Physical Exam  Constitutional:  oriented to person, place, and time. appears well-developed and well-nourished. No distress.  HENT: [place]/AT, PERRLA, no scleral icterus Mouth/Throat: Oropharynx is clear and moist. No oropharyngeal exudate.  Cardiovascular: Normal rate, regular rhythm and normal heart sounds. Exam reveals no gallop and no friction rub.  No murmur heard.  Pulmonary/Chest: Effort normal and breath sounds normal. No respiratory distress.  has no wheezes.  Neck = supple, no nuchal rigidity Abdominal: Soft. Bowel sounds are normal.  exhibits no distension. There is no tenderness.  Lymphadenopathy: no cervical adenopathy. No axillary adenopathy Neurological: alert and oriented to person, place, and time.  Skin: Skin is warm and dry. No rash noted. No erythema.  Psychiatric: a normal mood and affect.  behavior is normal.   CBC Lab Results  Component Value Date    WBC 13.1 (H) 07/28/2017   RBC 3.73 (L) 07/28/2017   HGB 9.6 (L) 07/28/2017   HCT 31.3 (L) 07/28/2017   PLT 287 07/28/2017   MCV 83.9 07/28/2017   MCH 25.7 (L) 07/28/2017   MCHC 30.7 07/28/2017   RDW 18.0 (H) 07/28/2017   LYMPHSABS 1.3 07/28/2017   MONOABS 1.7 07/28/2017   EOSABS 0.1 07/28/2017    BMET Lab Results  Component Value Date   NA 138 07/28/2017   K 4.5 07/28/2017   CL 107 07/28/2017   CO2 22 07/28/2017   GLUCOSE 86 07/28/2017   BUN 12 07/28/2017   CREATININE 0.83 07/28/2017   CALCIUM 7.7 (L) 07/28/2017   GFRNONAA 60 (L) 07/28/2017   GFRAA >60 07/28/2017      Assessment and Plan  Salmonella bacteremia = she has finished 2 wk treatment course and appears back to her baseline. Does not appear to need further treatment. She cleared her bacteremia shortly < 48hrs per record review. Also denies back pain to suggest need to do imaging of spine.  rtc as needed

## 2017-08-15 ENCOUNTER — Inpatient Hospital Stay: Payer: Medicare Other | Admitting: Hematology

## 2017-08-15 ENCOUNTER — Ambulatory Visit: Payer: Medicare Other | Admitting: Hematology

## 2017-08-15 ENCOUNTER — Inpatient Hospital Stay: Payer: Medicare Other

## 2017-08-15 ENCOUNTER — Inpatient Hospital Stay: Payer: Medicare Other | Attending: Hematology

## 2017-08-15 ENCOUNTER — Other Ambulatory Visit: Payer: Medicare Other

## 2017-08-15 VITALS — BP 133/72 | HR 85 | Temp 98.2°F | Resp 16 | Ht 60.98 in | Wt 121.5 lb

## 2017-08-15 DIAGNOSIS — C7800 Secondary malignant neoplasm of unspecified lung: Secondary | ICD-10-CM | POA: Insufficient documentation

## 2017-08-15 DIAGNOSIS — C7951 Secondary malignant neoplasm of bone: Secondary | ICD-10-CM | POA: Insufficient documentation

## 2017-08-15 DIAGNOSIS — Z5112 Encounter for antineoplastic immunotherapy: Secondary | ICD-10-CM | POA: Diagnosis not present

## 2017-08-15 DIAGNOSIS — C787 Secondary malignant neoplasm of liver and intrahepatic bile duct: Secondary | ICD-10-CM | POA: Insufficient documentation

## 2017-08-15 DIAGNOSIS — C651 Malignant neoplasm of right renal pelvis: Secondary | ICD-10-CM

## 2017-08-15 DIAGNOSIS — C659 Malignant neoplasm of unspecified renal pelvis: Secondary | ICD-10-CM

## 2017-08-15 DIAGNOSIS — I10 Essential (primary) hypertension: Secondary | ICD-10-CM | POA: Insufficient documentation

## 2017-08-15 DIAGNOSIS — Z95828 Presence of other vascular implants and grafts: Secondary | ICD-10-CM

## 2017-08-15 LAB — COMPREHENSIVE METABOLIC PANEL
ALBUMIN: 2.7 g/dL — AB (ref 3.5–5.0)
ALT: 8 U/L (ref 0–55)
AST: 16 U/L (ref 5–34)
Alkaline Phosphatase: 57 U/L (ref 40–150)
Anion gap: 7 (ref 3–11)
BUN: 15 mg/dL (ref 7–26)
CHLORIDE: 104 mmol/L (ref 98–109)
CO2: 26 mmol/L (ref 22–29)
CREATININE: 0.85 mg/dL (ref 0.60–1.10)
Calcium: 9.3 mg/dL (ref 8.4–10.4)
GFR calc Af Amer: >60 mL/min (ref >60)
GFR calc non Af Amer: 58 mL/min — ABNORMAL LOW (ref >60)
Glucose, Bld: 95 mg/dL (ref 70–140)
POTASSIUM: 4.3 mmol/L (ref 3.5–5.1)
SODIUM: 137 mmol/L (ref 136–145)
Total Bilirubin: 0.2 mg/dL (ref 0.2–1.2)
Total Protein: 7.3 g/dL (ref 6.4–8.3)

## 2017-08-15 LAB — CBC WITH DIFFERENTIAL (CANCER CENTER ONLY)
Basophils Absolute: 0 10*3/uL (ref 0.0–0.1)
Basophils Relative: 0 %
EOS ABS: 0.1 10*3/uL (ref 0.0–0.5)
EOS PCT: 1 %
HEMATOCRIT: 27.7 % — AB (ref 34.8–46.6)
HEMOGLOBIN: 8.3 g/dL — AB (ref 11.6–15.9)
LYMPHS ABS: 1.9 10*3/uL (ref 0.9–3.3)
LYMPHS PCT: 27 %
MCH: 25.4 pg (ref 25.1–34.0)
MCHC: 30 g/dL — AB (ref 31.5–36.0)
MCV: 84.7 fL (ref 79.5–101.0)
MONOS PCT: 15 %
Monocytes Absolute: 1 10*3/uL — ABNORMAL HIGH (ref 0.1–0.9)
Neutro Abs: 3.9 10*3/uL (ref 1.5–6.5)
Neutrophils Relative %: 57 %
Platelet Count: 414 10*3/uL — ABNORMAL HIGH (ref 145–400)
RBC: 3.27 MIL/uL — ABNORMAL LOW (ref 3.70–5.45)
RDW: 18 % — ABNORMAL HIGH (ref 11.2–14.5)
WBC Count: 6.8 10*3/uL (ref 3.9–10.3)

## 2017-08-15 LAB — TSH: TSH: 0.747 u[IU]/mL (ref 0.308–3.960)

## 2017-08-15 LAB — RETICULOCYTES
RBC.: 3.27 MIL/uL — AB (ref 3.70–5.45)
Retic Count, Absolute: 65.4 10*3/uL (ref 33.7–90.7)
Retic Ct Pct: 2 % (ref 0.7–2.1)

## 2017-08-15 MED ORDER — SODIUM CHLORIDE 0.9 % IV SOLN
1200.0000 mg | Freq: Once | INTRAVENOUS | Status: AC
  Administered 2017-08-15: 1200 mg via INTRAVENOUS
  Filled 2017-08-15: qty 20

## 2017-08-15 MED ORDER — DENOSUMAB 120 MG/1.7ML ~~LOC~~ SOLN
SUBCUTANEOUS | Status: AC
Start: 2017-08-15 — End: ?
  Filled 2017-08-15: qty 1.7

## 2017-08-15 MED ORDER — SODIUM CHLORIDE 0.9 % IJ SOLN
10.0000 mL | INTRAMUSCULAR | Status: DC | PRN
  Administered 2017-08-15: 10 mL
  Filled 2017-08-15: qty 10

## 2017-08-15 MED ORDER — SODIUM CHLORIDE 0.9 % IV SOLN
Freq: Once | INTRAVENOUS | Status: AC
  Administered 2017-08-15: 14:00:00 via INTRAVENOUS

## 2017-08-15 MED ORDER — DENOSUMAB 120 MG/1.7ML ~~LOC~~ SOLN
120.0000 mg | Freq: Once | SUBCUTANEOUS | Status: AC
  Administered 2017-08-15: 120 mg via SUBCUTANEOUS

## 2017-08-15 MED ORDER — HEPARIN SOD (PORK) LOCK FLUSH 100 UNIT/ML IV SOLN
500.0000 [IU] | Freq: Once | INTRAVENOUS | Status: AC | PRN
  Administered 2017-08-15: 500 [IU]
  Filled 2017-08-15: qty 5

## 2017-08-15 NOTE — Patient Instructions (Signed)
[facility] Discharge Instructions for Patients Receiving Chemotherapy  Today you received the following chemotherapy agents: Rosie Fate  To help prevent nausea and vomiting after your treatment, we encourage you to take your nausea medication as directed.   If you develop nausea and vomiting that is not controlled by your nausea medication, call the clinic.   BELOW ARE SYMPTOMS THAT SHOULD BE REPORTED IMMEDIATELY:  *FEVER GREATER THAN 100.5 F  *CHILLS WITH OR WITHOUT FEVER  NAUSEA AND VOMITING THAT IS NOT CONTROLLED WITH YOUR NAUSEA MEDICATION  *UNUSUAL SHORTNESS OF BREATH  *UNUSUAL BRUISING OR BLEEDING  TENDERNESS IN MOUTH AND THROAT WITH OR WITHOUT PRESENCE OF ULCERS  *URINARY PROBLEMS  *BOWEL PROBLEMS  UNUSUAL RASH Items with * indicate a potential emergency and should be followed up as soon as possible.  Feel free to call the clinic should you have any questions or concerns. The clinic phone number is [phone].  Please show the [place] at check-in to the Emergency Department and triage nurse.

## 2017-08-16 ENCOUNTER — Encounter: Payer: Self-pay | Admitting: Physician Assistant

## 2017-09-03 ENCOUNTER — Ambulatory Visit (HOSPITAL_COMMUNITY)
Admission: RE | Admit: 2017-09-03 | Discharge: 2017-09-03 | Disposition: A | Discharge status: Home / Self Care | Payer: Medicare Other | Source: Ambulatory Visit | Attending: Hematology | Admitting: Hematology

## 2017-09-03 DIAGNOSIS — C651 Malignant neoplasm of right renal pelvis: Secondary | ICD-10-CM | POA: Insufficient documentation

## 2017-09-03 DIAGNOSIS — N2881 Hypertrophy of kidney: Secondary | ICD-10-CM | POA: Diagnosis not present

## 2017-09-03 DIAGNOSIS — Z5112 Encounter for antineoplastic immunotherapy: Secondary | ICD-10-CM | POA: Diagnosis not present

## 2017-09-03 LAB — GLUCOSE, CAPILLARY: Glucose-Capillary: 74 mg/dL (ref 65–99)

## 2017-09-03 MED ORDER — FLUDEOXYGLUCOSE F - 18 (FDG) INJECTION
6.7100 | Freq: Once | INTRAVENOUS | Status: AC | PRN
  Administered 2017-09-03: 6.71 via INTRAVENOUS

## 2017-09-03 NOTE — Progress Notes (Signed)
Hematology oncology clinic follow-up.  Date of service:  09/05/17  Patient Care Team: [name], MD as PCP - General (Internal Medicine)   Urologist: Dr. 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  Treatment -Atezolizumab IV q3weeks  Delton See q12weeks for bone mets  HISTORY OF PRESENTING ILLNESS: Please see my previous notes for details of initial presentation.  INTERVAL HISTORY:   Tiffany [name] is here for her scheduled follow-up and cycle 46 treatment with Atezolizumab. The patient's last visit with [place] was on 08/15/17. She is accompanied today by her husband. The pt reports that she is doing well overall.   The pt denies pain in her right flank and denies any blood in her urine. She notes that she has not eaten very hardy meals of late and has lost a couple pounds in the last month. She adds that she consumes 2 boost/ensure a day.   Of note since the patient's last visit, pt has had PET completed on 09/03/17 with results revealing 1. Considerable enlargement of the RIGHT renal hilar mass with persistent intense peripheral hypermetabolic activity consistent with neoplasm. 2. Extension of neoplasm into the proximal RIGHT ureter with mild inferior progression (2 cm).  Lab results today (09/05/17) of CBC, CMP is as follows: all values are WNL except for RBC at 3.12, HGB at 7.9, HCT at 26.3, MCHC at 30.0, RDW at 18.2, Monocytes Abs at 1.1k, Albumin at 2.8. Ferritin 09/05/17 is WNL at 164 Iron/TIBC 09/05/17 shows a 6% Saturation ratio. Magnesium 09/05/17 is WNL at 1.8  On review of systems, pt reports some fatigue, some weight loss, decreased appetite, and denies flank pain, hematuria, abdominal pains, diarrhea, blood in the stools, fevers, chills, night sweats, and any other symptoms.       MEDICAL HISTORY:  Past Medical History:  Diagnosis Date  . Arthritis     knees 03-10-12 had Cortisone injection  . Cancer [facility] june 2016   metastatic  . DDD (degenerative disc disease), cervical   . Edema leg    feet and ankles  . Esophagus disorder    Had esophagus stretched  . GERD (gastroesophageal reflux disease)    Benign stricture dilated in 2011  . Gout    Patient notes she has possible gout but has not been on chronic medications for this  . Headache(784.0)   . Hypertension   . Occipital neuralgia   . Occipital neuralgia    Related to cervical degenerative disc disease  . Peptic ulcer disease    Previous history in the remote past  . Ulcerative proctitis ([place])   . 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 ABDOMINAL HYSTERECTOMY    . APPENDECTOMY    . CHOLECYSTECTOMY    . COLONOSCOPY WITH PROPOFOL  03/25/2012   Procedure: COLONOSCOPY WITH PROPOFOL;  Surgeon: Garlan Fair, MD;  Location: [facility];  Service: Endoscopy;  Laterality: N/A;  . DILATION AND CURETTAGE OF UTERUS    . ESOPHAGEAL DILATION     For benign stricture in 2011  . ESOPHAGOGASTRODUODENOSCOPY    . FLEXIBLE SIGMOIDOSCOPY N/A 04/26/2014   Procedure: FLEXIBLE SIGMOIDOSCOPY - UnSedated;  Surgeon: Garlan Fair, MD;  Location: [facility];  Service: Endoscopy;  Laterality: N/A;  . FLEXIBLE SIGMOIDOSCOPY N/A 01/24/2015  Procedure: FLEXIBLE SIGNMOIDOSCOPY W/ FLEET ENEMIA;  Surgeon: Garlan Fair, MD;  Location: [facility];  Service: Endoscopy;  Laterality: N/A;  . FLEXIBLE SIGMOIDOSCOPY N/A 05/15/2016   Procedure: FLEXIBLE SIGMOIDOSCOPY;  Surgeon: Garlan Fair, MD;  Location: [facility];  Service: Endoscopy;  Laterality: N/A;  pt needs to ahve an enema upon arrival   . NECK SURGERY     20 years ago  . TOTAL ABDOMINAL HYSTERECTOMY W/ BILATERAL SALPINGOOPHORECTOMY     At age 47 due to miscarriage and retained  products of conception causing significant uterine bleeding. Patient has been on estrogen replacement therapy since then.    SOCIAL HISTORY: Social History   Socioeconomic History  . Marital status: Married    Spouse name: Not on file  . Number of children: Not on file  . Years of education: Not on file  . Highest education level: Not on file  Occupational History  . Not on file  Social Needs  . Financial resource strain: Not on file  . Food insecurity:    Worry: Not on file    Inability: Not on file  . Transportation needs:    Medical: Not on file    Non-medical: Not on file  Tobacco Use  . Smoking status: Former Smoker    Last attempt to quit: 11/01/1983    Years since quitting: 33.8  . Smokeless tobacco: Never Used  Substance and Sexual Activity  . Alcohol use: Yes    Comment: occ  . Drug use: No  . Sexual activity: Never  Lifestyle  . Physical activity:    Days per week: Not on file    Minutes per session: Not on file  . Stress: Not on file  Relationships  . Social connections:    Talks on phone: Not on file    Gets together: Not on file    Attends religious service: Not on file    Active member of club or organization: Not on file    Attends meetings of clubs or organizations: Not on file    Relationship status: Not on file  . Intimate partner violence:    Fear of current or ex partner: Not on file    Emotionally abused: Not on file    Physically abused: Not on file    Forced sexual activity: Not on file  Other Topics Concern  . Not on file  Social History Narrative  . Not on file  Retired as Conservation officer, nature for [employer]. She is very well spoken and intelligent individual and exhibits a keen knowledge of her medical conditions.  FAMILY HISTORY: Family History  Problem Relation Age of Onset  . Lung cancer Sister   . Prostate cancer Brother   . Uterine cancer Maternal Aunt   . Breast cancer Paternal Grandmother   . Hodgkin's lymphoma  Sister     ALLERGIES:  is allergic to nitrofurantoin; indomethacin; and lisinopril.  MEDICATIONS:  Current Outpatient Medications  Medication Sig Dispense Refill  . acetaminophen (TYLENOL) 650 MG CR tablet Take 1,300 mg by mouth 2 (two) times daily.    Marland Kitchen amLODipine (NORVASC) 5 MG tablet Take 5 mg by mouth daily.    . benzonatate (TESSALON) 100 MG capsule Take 1 capsule (100 mg total) by mouth 3 (three) times daily as needed for cough. 90 capsule 2  . estradiol (ESTRACE) 0.5 MG tablet Take 0.5 mg by mouth daily.  1  . feeding supplement, ENSURE ENLIVE, (ENSURE ENLIVE) LIQD Take 237 mLs  by mouth 2 (two) times daily between meals. 237 mL 12  . mirtazapine (REMERON) 15 MG tablet TAKE 1 TABLET(15 MG) BY MOUTH AT BEDTIME 90 tablet 0  . PRESCRIPTION MEDICATION Chemo card    . Respiratory Therapy Supplies (FLUTTER) DEVI 1 each by Does not apply route daily. 1 each 0  . triamcinolone ointment (KENALOG) 0.5 % Apply 1 application topically 2 (two) times daily. 30 g 0  . vitamin B-12 (CYANOCOBALAMIN) 100 MCG tablet Take 100 mcg by mouth daily.    . diclofenac sodium (VOLTAREN) 1 % GEL APPLY 2 TO 4 GRAMS Q 6 H PRN FOR PAIN  0  . furosemide (LASIX) 20 MG tablet Take 1 tablet (20 mg total) by mouth daily for 3 days. 3 tablet 0  . iron polysaccharides (NIFEREX) 150 MG capsule Take 1 capsule (150 mg total) by mouth daily. (Patient not taking: Reported on 09/05/2017) 30 capsule 1  . ondansetron (ZOFRAN ODT) 4 MG disintegrating tablet Take 1 tablet (4 mg total) by mouth every 8 (eight) hours as needed for nausea or vomiting. (Patient not taking: Reported on 08/14/2017) 20 tablet 0   No current facility-administered medications for this visit.    Facility-Administered Medications Ordered in Other Visits  Medication Dose Route Frequency Provider Last Rate Last Dose  . sodium chloride 0.9 % injection 10 mL  10 mL Intravenous PRN [name], MD   10 mL at 02/28/17 1602   REVIEW OF SYSTEMS:   A 10+  POINT REVIEW OF SYSTEMS WAS OBTAINED including neurology, dermatology, psychiatry, cardiac, respiratory, lymph, extremities, GI, GU, Musculoskeletal, constitutional, breasts, reproductive, HEENT.  All pertinent positives are noted in the HPI.  All others are negative.   PHYSICAL EXAMINATION: ECOG PERFORMANCE STATUS: 2  Vitals:   09/05/17 1030  BP: (!) 155/71  Pulse: 65  Resp: 18  Temp: 98 F (36.7 C)  SpO2: 98%   Filed Weights   09/05/17 1030  Weight: 118 lb 14.4 oz (53.9 kg)     GENERAL:alert, in no acute distress and comfortable SKIN: no acute rashes, no significant lesions EYES: conjunctiva are pink and non-injected, sclera anicteric OROPHARYNX: MMM, no exudates, no oropharyngeal erythema or ulceration NECK: supple, no JVD LYMPH:  no palpable lymphadenopathy in the cervical, axillary or inguinal regions LUNGS: clear to auscultation b/l with normal respiratory effort HEART: regular rate & rhythm ABDOMEN:  normoactive bowel sounds , non tender, not distended. Extremity: no pedal edema PSYCH: alert & oriented x 3 with fluent speech NEURO: no focal motor/sensory deficits   LABORATORY DATA:   CBC Latest Ref Rng & Units 09/05/2017 08/15/2017 07/28/2017  WBC 3.9 - 10.3 K/uL 7.2 6.8 13.1(H)  Hemoglobin 11.6 - 15.9 g/dL 7.9(L) 8.3(L) 9.6(L)  Hematocrit 34.8 - 46.6 % 26.3(L) 27.7(L) 31.3(L)  Platelets 145 - 400 K/uL 361 414(H) 287   . CBC    Component Value Date/Time   WBC 7.2 09/05/2017 0825   RBC 3.12 (L) 09/05/2017 0825   HGB 7.9 (L) 09/05/2017 0825   HGB 8.3 (L) 08/15/2017 1121   HGB 9.0 (L) 03/21/2017 1352   HCT 26.3 (L) 09/05/2017 0825   HCT 29.3 (L) 03/21/2017 1352   PLT 361 09/05/2017 0825   PLT 414 (H) 08/15/2017 1121   PLT 311 03/21/2017 1352   MCV 84.3 09/05/2017 0825   MCV 90.7 03/21/2017 1352   MCH 25.3 09/05/2017 0825   MCHC 30.0 (L) 09/05/2017 0825   RDW 18.2 (H) 09/05/2017 0825   RDW 16.3 (H) 03/21/2017  1352   LYMPHSABS 1.3 09/05/2017 0825    LYMPHSABS 1.6 03/21/2017 1352   MONOABS 1.1 (H) 09/05/2017 0825   MONOABS 0.6 03/21/2017 1352   EOSABS 0.1 09/05/2017 0825   EOSABS 0.1 03/21/2017 1352   BASOSABS 0.0 09/05/2017 0825   BASOSABS 0.0 03/21/2017 1352      CMP Latest Ref Rng & Units 09/05/2017 08/15/2017 07/28/2017  Glucose 70 - 140 mg/dL 96 95 86  BUN 7 - 26 mg/dL 20 15 12   Creatinine 0.60 - 1.10 mg/dL 0.96 0.85 0.83  Sodium 136 - 145 mmol/L 138 137 138  Potassium 3.5 - 5.1 mmol/L 4.6 4.3 4.5  Chloride 98 - 109 mmol/L 105 104 107  CO2 22 - 29 mmol/L 24 26 22   Calcium 8.4 - 10.4 mg/dL 9.3 9.3 7.7(L)  Total Protein 6.4 - 8.3 g/dL 7.3 7.3 6.4(L)  Total Bilirubin 0.2 - 1.2 mg/dL 0.3 0.2 1.0  Alkaline Phos 40 - 150 U/L 50 57 55  AST 5 - 34 U/L 19 16 31   ALT 0 - 55 U/L 7 8 19    . Lab Results  Component Value Date   LDH 228 05/11/2016    Radiology .Nm Pet Image Restag (ps) Skull Base To Thigh  Result Date: 09/04/2017 CLINICAL DATA:  Subsequent treatment strategy for carcinoma of right renal pelvis transitional cell carcinoma. Immunotherapy ongoing. EXAM: NUCLEAR MEDICINE PET SKULL BASE TO THIGH TECHNIQUE: 6.7 mCi F-18 FDG was injected intravenously. Full-ring PET imaging was performed from the skull base to thigh after the radiotracer. CT data was obtained and used for attenuation correction and anatomic localization. Fasting blood glucose: 74 mg/dl COMPARISON:  04/04/2017 FINDINGS: Mediastinal blood pool activity: SUV max 1.8 NECK: No hypermetabolic lymph nodes in the neck. Incidental CT findings: none CHEST: No hypermetabolic mediastinal or hilar nodes. No suspicious pulmonary nodules on the CT scan. Incidental CT findings: Port in the anterior chest wall with tip in distal SVC. ABDOMEN/PELVIS: RIGHT renal hilar mass is increased significantly in size measuring 10.6 by 9.7 cm in axial dimension increased from 8.8 x 7.7 cm. The lesion continues demonstrate extremely intense peripheral hypermetabolic activity with SUV max equal  28 compared SUV max equal 29 comparison exam. The lesion is centrally hypometabolic suggesting central necrosis. The hypermetabolic tissue extends into the RIGHT renal pelvis and proximal RIGHT ureter the level of the pelvic brim. The tumor has progressed inferiorly approximately 2 cm in the ureter compared to the prior exam. No abnormal activity in the RIGHT kidney LEFT kidney. No hypermetabolic abdominopelvic lymph nodes. No abnormal activity liver. Incidental CT findings: Atherosclerotic calcification of the aorta. Post hysterectomy SKELETON: No focal hypermetabolic activity to suggest skeletal metastasis. Incidental CT findings: none IMPRESSION: 1. Considerable enlargement of the RIGHT renal hilar mass with persistent intense peripheral hypermetabolic activity consistent with neoplasm. 2. Extension of neoplasm into the proximal RIGHT ureter with mild inferior progression (2 cm). Electronically Signed   By: Suzy Bouchard M.D.   On: 09/04/2017 09:26    ASSESSMENT & PLAN:   84 year old Caucasian female with  #1 Right Renal pelvis metastatic transitional cell carcinoma.   Initial PET scan revealed metastases to the lungs, liver, multiple nodal stations and bones. -PET scan done after 8 cycles of treatment for restaging disease showed significant response to treatment with no evidence of active disease. Patient has no clinical evidence of disease progression at this time. CT chest 06/16/2015 showed no evidence of cancer progression. PET/CT scan done on 10/21/2015- showed no findings for metastatic disease.  New right-sided hydronephrosis versus local tumor recurrence needs to be evaluated further. MRI of the kidney showed hyperenhancing the right renal pelvis lesion consistent with concern for local recurrence. -PET/CT 03/06/2016 with no findings of metastatic disease. Extensive right-sided hydronephrosis with high activity from excreted FDG obscuring any findings along the collecting system. -PET/CT   06/13/2016- Interval increase in size of right kidney. Findings are suspicious for progression of residual/recurrent urothelial carcinoma with further dilatation of the right renal collecting system. 2. No evidence for hypermetabolic metastases.  -PET/CT 10/22/2016 - Continued interval progression of the abnormal soft tissue associated with the right kidney. This soft tissue remains markedly hypermetabolic and is compatible with continued progression of recurrent right renal disease. 2. No evidence for hypermetabolic metastases in the neck, chest, abdomen, or pelvis.  PET/CT 04/04/2017-  Interval enlargement of and increase in hypermetabolism within the right renal mass, consistent with residual/recurrent disease. Increase in disease progression along the proximal right ureter. 2. No evidence of abdominopelvic nodal or extra abdominal metastatic disease.   #2 Bone metastases  Plan  -continue Atezolizumab q3 weeks as per orders. -continue Xgeva to q12 weeks since she has completed 2 yrs of this treatment already. -transfuse prn for hgb <8 or if symptomatic. -Discussed pt labwork today, 09/05/17; Albumin at 2.8, HGB decreased to 7.9 -Will order blood transfusion of 2 PRBCs -Discussed 09/03/17 PET which revealed 1. Considerable enlargement of the RIGHT renal hilar mass with persistent intense peripheral hypermetabolic activity consistent with neoplasm. 2. Extension of neoplasm into the proximal RIGHT ureter with mild inferior progression (2 cm). -Given progression of right renal hilar mass and worsening anemia, weight loss, low albumin counts, discussed the recommendation for radiation oncology involvement -The pt would like to meet with radiation oncology and discuss pallaitive RT treatment options. -Recommended and discussed meal optimization with pt and her husband  -The pt has no prohibitive toxicities from continuing maintenance therapy at this time.    #2 h/o skin rash likely related to her  Atezolizumab. Minimal grade 1 and controlled with topical triamcinolone. Unchanged today.  #3 chronic bronchiectasis with Cough and some clear secretions likely due to bronchiectasis. Followed by Dr Melvyn Novas. -No chest pain no increased shortness of breath or dyspnea on exertion  -Has an action plan with Dr. Melvyn Novas to use when necessary antibiotics for worsening symptoms. -Continue follow-up with Dr. 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 Wynetta Emery.  But stopped using it due to cost issues. Was then on PO budesonide which she has stopped as well. last sigmoidscopy with Dr Wynetta Emery - showed stable ulcerative proctitis  -transfuse prn for hgb<8. Does not report overt rectal bleeding at this time.  #5 Recent Salmonella enteritis -- resolved. diarrhoea resolved.  #6 history of hypertension   -on Amlodipine    Radiation oncology referral for palliative radiation to rt renal pelvis mass (well controlled systemic disease Continue Atezolizumab q3weeks and Xgeva q12 weeks RTC with Dr Irene Limbo with each dose of Atezolizumab   All of the patients questions were answered with apparent satisfaction. The patient knows to call the clinic with any problems, questions or concerns.  . The total time spent in the appointment was 25 minutes and more than 50% was on counseling and direct patient cares.   Sullivan Lone MD [place] [facility] Phys[facility]  (Office):  [phone] (Work cell):  [phone] (Fax):           [phone]  I, Baldwin [place], am acting as a scribe for Dr Irene Limbo.   .I have reviewed the above documentation for accuracy and completeness, and I agree with the above.  [name] MD

## 2017-09-05 ENCOUNTER — Inpatient Hospital Stay: Payer: Medicare Other

## 2017-09-05 ENCOUNTER — Telehealth: Payer: Self-pay | Admitting: *Deleted

## 2017-09-05 ENCOUNTER — Inpatient Hospital Stay: Payer: Medicare Other | Attending: Hematology | Admitting: Hematology

## 2017-09-05 ENCOUNTER — Encounter: Payer: Self-pay | Admitting: Hematology

## 2017-09-05 VITALS — BP 155/71 | HR 65 | Temp 98.0°F | Resp 18 | Ht 60.98 in | Wt 118.9 lb

## 2017-09-05 DIAGNOSIS — D649 Anemia, unspecified: Secondary | ICD-10-CM

## 2017-09-05 DIAGNOSIS — C7951 Secondary malignant neoplasm of bone: Secondary | ICD-10-CM | POA: Diagnosis not present

## 2017-09-05 DIAGNOSIS — C651 Malignant neoplasm of right renal pelvis: Secondary | ICD-10-CM

## 2017-09-05 DIAGNOSIS — I10 Essential (primary) hypertension: Secondary | ICD-10-CM | POA: Diagnosis not present

## 2017-09-05 DIAGNOSIS — R197 Diarrhea, unspecified: Secondary | ICD-10-CM | POA: Diagnosis not present

## 2017-09-05 DIAGNOSIS — K921 Melena: Secondary | ICD-10-CM | POA: Diagnosis not present

## 2017-09-05 DIAGNOSIS — Z5112 Encounter for antineoplastic immunotherapy: Secondary | ICD-10-CM | POA: Insufficient documentation

## 2017-09-05 DIAGNOSIS — C787 Secondary malignant neoplasm of liver and intrahepatic bile duct: Secondary | ICD-10-CM | POA: Insufficient documentation

## 2017-09-05 DIAGNOSIS — C778 Secondary and unspecified malignant neoplasm of lymph nodes of multiple regions: Secondary | ICD-10-CM | POA: Diagnosis not present

## 2017-09-05 DIAGNOSIS — R21 Rash and other nonspecific skin eruption: Secondary | ICD-10-CM | POA: Insufficient documentation

## 2017-09-05 DIAGNOSIS — C659 Malignant neoplasm of unspecified renal pelvis: Secondary | ICD-10-CM

## 2017-09-05 DIAGNOSIS — Z95828 Presence of other vascular implants and grafts: Secondary | ICD-10-CM

## 2017-09-05 DIAGNOSIS — C7800 Secondary malignant neoplasm of unspecified lung: Secondary | ICD-10-CM | POA: Diagnosis not present

## 2017-09-05 LAB — CBC WITH DIFFERENTIAL/PLATELET
BASOS ABS: 0 10*3/uL (ref 0.0–0.1)
BASOS PCT: 0 %
EOS ABS: 0.1 10*3/uL (ref 0.0–0.5)
Eosinophils Relative: 1 %
HCT: 26.3 % — ABNORMAL LOW (ref 34.8–46.6)
HEMOGLOBIN: 7.9 g/dL — AB (ref 11.6–15.9)
Lymphocytes Relative: 18 %
Lymphs Abs: 1.3 10*3/uL (ref 0.9–3.3)
MCH: 25.3 pg (ref 25.1–34.0)
MCHC: 30 g/dL — ABNORMAL LOW (ref 31.5–36.0)
MCV: 84.3 fL (ref 79.5–101.0)
Monocytes Absolute: 1.1 10*3/uL — ABNORMAL HIGH (ref 0.1–0.9)
Monocytes Relative: 15 %
NEUTROS PCT: 66 %
Neutro Abs: 4.7 10*3/uL (ref 1.5–6.5)
Platelets: 361 10*3/uL (ref 145–400)
RBC: 3.12 MIL/uL — AB (ref 3.70–5.45)
RDW: 18.2 % — ABNORMAL HIGH (ref 11.2–14.5)
WBC: 7.2 10*3/uL (ref 3.9–10.3)

## 2017-09-05 LAB — FERRITIN: FERRITIN: 164 ng/mL (ref 9–269)

## 2017-09-05 LAB — IRON AND TIBC
Iron: 14 ug/dL — ABNORMAL LOW (ref 41–142)
Saturation Ratios: 6 % — ABNORMAL LOW (ref 21–57)
TIBC: 231 ug/dL — ABNORMAL LOW (ref 236–444)
UIBC: 217 ug/dL

## 2017-09-05 LAB — CMP (CANCER CENTER ONLY)
ALBUMIN: 2.8 g/dL — AB (ref 3.5–5.0)
ALK PHOS: 50 U/L (ref 40–150)
ALT: 7 U/L (ref 0–55)
ANION GAP: 9 (ref 3–11)
AST: 19 U/L (ref 5–34)
BUN: 20 mg/dL (ref 7–26)
CALCIUM: 9.3 mg/dL (ref 8.4–10.4)
CO2: 24 mmol/L (ref 22–29)
CREATININE: 0.96 mg/dL (ref 0.60–1.10)
Chloride: 105 mmol/L (ref 98–109)
GFR, Est AFR Am: 58 mL/min — ABNORMAL LOW (ref >60)
GFR, Estimated: 50 mL/min — ABNORMAL LOW (ref >60)
GLUCOSE: 96 mg/dL (ref 70–140)
Potassium: 4.6 mmol/L (ref 3.5–5.1)
SODIUM: 138 mmol/L (ref 136–145)
Total Bilirubin: 0.3 mg/dL (ref 0.2–1.2)
Total Protein: 7.3 g/dL (ref 6.4–8.3)

## 2017-09-05 LAB — SAMPLE TO BLOOD BANK

## 2017-09-05 LAB — MAGNESIUM: Magnesium: 1.8 mg/dL (ref 1.7–2.4)

## 2017-09-05 MED ORDER — SODIUM CHLORIDE 0.9 % IJ SOLN
10.0000 mL | INTRAMUSCULAR | Status: DC | PRN
  Administered 2017-09-05: 10 mL via INTRAVENOUS
  Filled 2017-09-05: qty 10

## 2017-09-05 MED ORDER — SODIUM CHLORIDE 0.9 % IJ SOLN
10.0000 mL | INTRAMUSCULAR | Status: DC | PRN
  Administered 2017-09-05: 10 mL
  Filled 2017-09-05: qty 10

## 2017-09-05 MED ORDER — SODIUM CHLORIDE 0.9 % IV SOLN
1200.0000 mg | Freq: Once | INTRAVENOUS | Status: AC
  Administered 2017-09-05: 1200 mg via INTRAVENOUS
  Filled 2017-09-05: qty 20

## 2017-09-05 MED ORDER — SODIUM CHLORIDE 0.9 % IV SOLN
Freq: Once | INTRAVENOUS | Status: AC
  Administered 2017-09-05: 13:00:00 via INTRAVENOUS

## 2017-09-05 MED ORDER — HEPARIN SOD (PORK) LOCK FLUSH 100 UNIT/ML IV SOLN
500.0000 [IU] | Freq: Once | INTRAVENOUS | Status: AC | PRN
  Administered 2017-09-05: 500 [IU]
  Filled 2017-09-05: qty 5

## 2017-09-05 NOTE — Telephone Encounter (Signed)
Ok to treat today per [place].  Patient needs 2 units of RBC either today/tomorrow or Saturday.  Scheduling message sent as high priority.

## 2017-09-05 NOTE — Patient Instructions (Signed)
[facility] Discharge Instructions for Patients Receiving Chemotherapy  Today you received the following chemotherapy agents:  Tecentriq (atezolizumab)  To help prevent nausea and vomiting after your treatment, we encourage you to take your nausea medication as prescribed.   If you develop nausea and vomiting that is not controlled by your nausea medication, call the clinic.   BELOW ARE SYMPTOMS THAT SHOULD BE REPORTED IMMEDIATELY:  *FEVER GREATER THAN 100.5 F  *CHILLS WITH OR WITHOUT FEVER  NAUSEA AND VOMITING THAT IS NOT CONTROLLED WITH YOUR NAUSEA MEDICATION  *UNUSUAL SHORTNESS OF BREATH  *UNUSUAL BRUISING OR BLEEDING  TENDERNESS IN MOUTH AND THROAT WITH OR WITHOUT PRESENCE OF ULCERS  *URINARY PROBLEMS  *BOWEL PROBLEMS  UNUSUAL RASH Items with * indicate a potential emergency and should be followed up as soon as possible.  Feel free to call the clinic should you have any questions or concerns. The clinic phone number is [phone].  Please show the CHEMO ALERT CARD at check-in to the Emergency Department and triage nurse.   

## 2017-09-06 ENCOUNTER — Other Ambulatory Visit: Payer: Self-pay

## 2017-09-06 ENCOUNTER — Telehealth: Payer: Self-pay

## 2017-09-06 DIAGNOSIS — C651 Malignant neoplasm of right renal pelvis: Secondary | ICD-10-CM

## 2017-09-06 NOTE — Telephone Encounter (Signed)
  Per 6/6 los added August appointment, mailed letter and calender.

## 2017-09-07 ENCOUNTER — Inpatient Hospital Stay: Payer: Medicare Other

## 2017-09-07 VITALS — BP 148/69 | HR 73 | Temp 98.2°F | Resp 17

## 2017-09-07 DIAGNOSIS — R197 Diarrhea, unspecified: Secondary | ICD-10-CM | POA: Diagnosis not present

## 2017-09-07 DIAGNOSIS — R21 Rash and other nonspecific skin eruption: Secondary | ICD-10-CM | POA: Diagnosis not present

## 2017-09-07 DIAGNOSIS — C651 Malignant neoplasm of right renal pelvis: Secondary | ICD-10-CM

## 2017-09-07 DIAGNOSIS — C7951 Secondary malignant neoplasm of bone: Secondary | ICD-10-CM | POA: Diagnosis not present

## 2017-09-07 DIAGNOSIS — C787 Secondary malignant neoplasm of liver and intrahepatic bile duct: Secondary | ICD-10-CM | POA: Diagnosis not present

## 2017-09-07 DIAGNOSIS — Z95828 Presence of other vascular implants and grafts: Secondary | ICD-10-CM

## 2017-09-07 DIAGNOSIS — C778 Secondary and unspecified malignant neoplasm of lymph nodes of multiple regions: Secondary | ICD-10-CM | POA: Diagnosis not present

## 2017-09-07 DIAGNOSIS — I10 Essential (primary) hypertension: Secondary | ICD-10-CM | POA: Diagnosis not present

## 2017-09-07 DIAGNOSIS — K921 Melena: Secondary | ICD-10-CM | POA: Diagnosis not present

## 2017-09-07 DIAGNOSIS — Z5112 Encounter for antineoplastic immunotherapy: Secondary | ICD-10-CM | POA: Diagnosis not present

## 2017-09-07 DIAGNOSIS — C7800 Secondary malignant neoplasm of unspecified lung: Secondary | ICD-10-CM | POA: Diagnosis not present

## 2017-09-07 LAB — PREPARE RBC (CROSSMATCH)

## 2017-09-07 LAB — ABO/RH: ABO/RH(D): A POS

## 2017-09-07 MED ORDER — SODIUM CHLORIDE 0.9% FLUSH
10.0000 mL | INTRAVENOUS | Status: DC | PRN
  Filled 2017-09-07: qty 10

## 2017-09-07 MED ORDER — SODIUM CHLORIDE 0.9 % IV SOLN
250.0000 mL | Freq: Once | INTRAVENOUS | Status: AC
  Administered 2017-09-07: 250 mL via INTRAVENOUS

## 2017-09-07 MED ORDER — SODIUM CHLORIDE 0.9% FLUSH
10.0000 mL | INTRAVENOUS | Status: DC | PRN
  Administered 2017-09-07: 10 mL via INTRAVENOUS
  Filled 2017-09-07: qty 10

## 2017-09-07 MED ORDER — HEPARIN SOD (PORK) LOCK FLUSH 100 UNIT/ML IV SOLN
500.0000 [IU] | Freq: Once | INTRAVENOUS | Status: AC
  Administered 2017-09-07: 500 [IU] via INTRAVENOUS
  Filled 2017-09-07: qty 5

## 2017-09-07 NOTE — Patient Instructions (Signed)
Blood Transfusion, Adult, Care After This sheet gives you information about how to care for yourself after your procedure. Your health care provider may also give you more specific instructions. If you have problems or questions, contact your health care provider. What can I expect after the procedure? After your procedure, it is common to have:  Bruising and soreness where the IV tube was inserted.  Headache.  Follow these instructions at home:  Take over-the-counter and prescription medicines only as told by your health care provider.  Return to your normal activities as told by your health care provider.  Follow instructions from your health care provider about how to take care of your IV insertion site. Make sure you: ? Wash your hands with soap and water before you change your bandage (dressing). If soap and water are not available, use hand sanitizer. ? Change your dressing as told by your health care provider.  Check your IV insertion site every day for signs of infection. Check for: ? More redness, swelling, or pain. ? More fluid or blood. ? Warmth. ? Pus or a bad smell. Contact a health care provider if:  You have more redness, swelling, or pain around the IV insertion site.  You have more fluid or blood coming from the IV insertion site.  Your IV insertion site feels warm to the touch.  You have pus or a bad smell coming from the IV insertion site.  Your urine turns pink, red, or brown.  You feel weak after doing your normal activities. Get help right away if:  You have signs of a serious allergic or immune system reaction, including: ? Itchiness. ? Hives. ? Trouble breathing. ? Anxiety. ? Chest or lower back pain. ? Fever, flushing, and chills. ? Rapid pulse. ? Rash. ? Diarrhea. ? Vomiting. ? Dark urine. ? Serious headache. ? Dizziness. ? Stiff neck. ? Yellow coloration of the face or the white parts of the eyes (jaundice). This information is not  intended to replace advice given to you by your health care provider. Make sure you discuss any questions you have with your health care provider. Document Released: 04/09/2014 Document Revised: 11/16/2015 Document Reviewed: 10/03/2015 Elsevier Interactive Patient Education  2018 [employer]  

## 2017-09-09 ENCOUNTER — Ambulatory Visit: Payer: Medicare Other | Admitting: Physician Assistant

## 2017-09-09 ENCOUNTER — Encounter: Payer: Self-pay | Admitting: Physician Assistant

## 2017-09-09 VITALS — BP 140/70 | HR 75 | Ht 60.0 in | Wt 119.4 lb

## 2017-09-09 DIAGNOSIS — R0600 Dyspnea, unspecified: Secondary | ICD-10-CM

## 2017-09-09 DIAGNOSIS — M25471 Effusion, right ankle: Secondary | ICD-10-CM

## 2017-09-09 DIAGNOSIS — M25472 Effusion, left ankle: Secondary | ICD-10-CM | POA: Diagnosis not present

## 2017-09-09 DIAGNOSIS — C651 Malignant neoplasm of right renal pelvis: Secondary | ICD-10-CM

## 2017-09-09 DIAGNOSIS — R0609 Other forms of dyspnea: Secondary | ICD-10-CM | POA: Diagnosis not present

## 2017-09-09 LAB — BPAM RBC
BLOOD PRODUCT EXPIRATION DATE: 201906282359
Blood Product Expiration Date: 201906282359
ISSUE DATE / TIME: 201906080907
ISSUE DATE / TIME: 201906080907
UNIT TYPE AND RH: 6200
Unit Type and Rh: 6200

## 2017-09-09 LAB — TYPE AND SCREEN
ABO/RH(D): A POS
Antibody Screen: POSITIVE
DAT, IgG: NEGATIVE
UNIT DIVISION: 0
Unit division: 0

## 2017-09-09 NOTE — Progress Notes (Signed)
Cardiology Office Note    Date:  09/09/2017   ID:  CHRYSA RAMPY, DOB May 24, 1924, MRN [id number]  PCP:  [name], MD  Cardiologist:  New to Dr. Curt Bears Pulmonologist: Dr Christinia Gully  Chief Complaint: LE Edema  History of Present Illness:   GERAL TUCH is a 84 y.o. 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 Delfina Redwood for LE edema.   She was admitted 07/2017 with sepsis 2nd to enteritis and bacteremia. She was treated with IV fluids for dehydration and sepsis. At discharge she had lots of leg edema. She took lasix to 3 days and no reoccurrence since then. Referral made meanwhile.   Per last oncology note 09/05/2017 " Given progression of right renal hilar mass and worsening anemia, weight loss, low albumin counts, discussed the recommendation for IR involvement:  She lives independently with husband. She still cooks meal and does household stuff. She does get dyspnea with climbing 2 story stairs. No chest pain, orthopnea, PND, LE edema, palpitations, dizziness, syncope, melena or blood in her stool or urine. Remote 20 pack year tobacco smoking. Quite at age 69. No prior cardiac hx.   Past Medical History:  Diagnosis Date  . Arthritis     knees 03-10-12 had Cortisone injection  . Cancer [facility]) june 2016   metastatic  . DDD (degenerative disc disease), cervical   . Edema leg    feet and ankles  . Esophagus disorder    Had esophagus stretched  . GERD (gastroesophageal reflux disease)    Benign stricture dilated in 2011  . Gout    Patient notes she has possible gout but has not been on chronic medications for this  . Headache(784.0)   . Hypertension   . Occipital neuralgia   . Occipital neuralgia    Related to cervical degenerative disc disease  . Peptic ulcer disease    Previous history in the remote past  . Ulcerative proctitis  ([place])   . Ulcerative pro[place]itis [place]) 2014    Past Surgical History:  Procedure Laterality Date  . ABDOMINAL HYSTERECTOMY    . APPENDECTOMY    . CHOLECYSTECTOMY    . COLONOSCOPY WITH PROPOFOL  03/25/2012   Procedure: COLONOSCOPY WITH PROPOFOL;  Surgeon: Garlan Fair, MD;  Location: [facility] ENDOSCOPY;  Service: Endoscopy;  Laterality: N/A;  . DILATION AND CURETTAGE OF UTERUS    . ESOPHAGEAL DILATION     For benign stricture in 2011  . ESOPHAGOGASTRODUODENOSCOPY    . FLEXIBLE SIGMOIDOSCOPY N/A 04/26/2014   Procedure: FLEXIBLE SIGMOIDOSCOPY - UnSedated;  Surgeon: Garlan Fair, MD;  Location: [facility] END[facility];  Service: Endoscopy;  Laterality: N/A;  . FLEXIBLE SIGMOIDOSCOPY N/A 01/24/2015   Procedure: FLEXIBLE SIGNMOIDOSCOPY W/ FLEET ENEMIA;  Surgeon: Garlan Fair, MD;  Location: [facility];  Service: Endoscopy;  Laterality: N/A;  . FLEXIBLE SIGMOIDOSCOPY N/A 05/15/2016   Procedure: FLEXIBLE SIGMOIDOSCOPY;  Surgeon: Garlan Fair, MD;  Location: [facility];  Service: Endoscopy;  Laterality: N/A;  pt needs to ahve an enema upon arrival   . NECK SURGERY     20 years ago  . TOTAL ABDOMINAL HYSTERECTOMY W/ BILATERAL SALPINGOOPHORECTOMY     At age 35 due to miscarriage and retained products of conception causing significant uterine bleeding. Patient has been on estrogen replacement therapy since then.    Current Medications: Prior to Admission medications   Medication Sig Start Date End  Date Taking? Authorizing Provider  acetaminophen (TYLENOL) 650 MG CR tablet Take 1,300 mg by mouth 2 (two) times daily.    [provider]  amLODipine (NORVASC) 5 MG tablet Take 5 mg by mouth daily.    [provider]  benzonatate (TESSALON) 100 MG capsule Take 1 capsule (100 mg total) by mouth 3 (three) times daily as needed for cough. 05/03/17   [name], MD  diclofenac sodium (VOLTAREN) 1 % GEL APPLY 2 TO 4 GRAMS Q 6 H PRN FOR PAIN 06/20/17   [provider]    estradiol (ESTRACE) 0.5 MG tablet Take 0.5 mg by mouth daily. 08/02/14   [provider]  feeding supplement, ENSURE ENLIVE, (ENSURE ENLIVE) LIQD Take 237 mLs by mouth 2 (two) times daily between meals. 07/26/17   Aline August, MD  furosemide (LASIX) 20 MG tablet Take 1 tablet (20 mg total) by mouth daily for 3 days. 07/28/17 07/31/17  Gareth Morgan, MD  iron polysaccharides (NIFEREX) 150 MG capsule Take 1 capsule (150 mg total) by mouth daily. Patient not taking: Reported on 09/05/2017 07/04/17   [name], MD  mirtazapine (REMERON) 15 MG tablet TAKE 1 TABLET(15 MG) BY MOUTH AT BEDTIME 06/20/17   [name], MD  ondansetron (ZOFRAN ODT) 4 MG disintegrating tablet Take 1 tablet (4 mg total) by mouth every 8 (eight) hours as needed for nausea or vomiting. Patient not taking: Reported on 08/14/2017 07/26/17   Aline August, MD  PRESCRIPTION MEDICATION Chemo card    [provider]  Respiratory Therapy Supplies (FLUTTER) [place] 1 each by Does not apply route daily. 07/06/15   Tanda Rockers, MD  triamcinolone ointment (KENALOG) 0.5 % Apply 1 application topically 2 (two) times daily. 05/03/17   [name], MD  vitamin B-12 (CYANOCOBALAMIN) 100 MCG tablet Take 100 mcg by mouth daily.    [provider]    Allergies:   Nitrofurantoin; Indomethacin; and Lisinopril   Social History   Socioeconomic History  . Marital status: Married    Spouse name: Not on file  . Number of children: Not on file  . Years of education: Not on file  . Highest education level: Not on file  Occupational History  . Not on file  Social Needs  . Financial resource strain: Not on file  . Food insecurity:    Worry: Not on file    Inability: Not on file  . Transportation needs:    Medical: Not on file    Non-medical: Not on file  Tobacco Use  . Smoking status: Former Smoker    Last attempt to quit: 11/01/1983    Years since quitting: 33.8  . Smokeless tobacco: Never  Used  Substance and Sexual Activity  . Alcohol use: Yes    Comment: occ  . Drug use: No  . Sexual activity: Never  Lifestyle  . Physical activity:    Days per week: Not on file    Minutes per session: Not on file  . Stress: Not on file  Relationships  . Social connections:    Talks on phone: Not on file    Gets together: Not on file    Attends religious service: Not on file    Active member of club or organization: Not on file    Attends meetings of clubs or organizations: Not on file    Relationship status: Not on file  Other Topics Concern  . Not on file  Social History Narrative  .  Not on file     Family History:  The patient's family history includes Breast cancer in her paternal grandmother; Hodgkin's lymphoma in her sister; Lung cancer in her sister; Prostate cancer in her brother; Uterine cancer in her maternal aunt.   ROS:   Please see the history of present illness.    ROS All other systems reviewed and are negative.   PHYSICAL EXAM:   VS:  BP 140/70   Pulse 75   Ht 5' (1.524 m)   Wt 119 lb 6.4 oz (54.2 kg)   SpO2 95%   BMI 23.32 kg/m    GEN: Well nourished, well developed, in no acute distress  HEENT: normal  Neck: no JVD, carotid bruits, or masses Cardiac: RRR; no murmurs, rubs, or gallops, trace ankle edema  Respiratory:  clear to auscultation bilaterally, normal work of breathing GI: soft, nontender, nondistended, + BS MS: no deformity or atrophy  Skin: warm and dry, no rash Neuro:  Alert and Oriented x 3, Strength and sensation are intact Psych: euthymic mood, full affect  Wt Readings from Last 3 Encounters:  09/09/17 119 lb 6.4 oz (54.2 kg)  09/05/17 118 lb 14.4 oz (53.9 kg)  08/15/17 121 lb 8 oz (55.1 kg)      Studies/Labs Reviewed:   EKG:  EKG is not  ordered today.  The ekg ordered 07/28/17 demonstrates NSR.   Recent Labs: 07/28/2017: B Natriuretic Peptide 1,676.9 08/15/2017: TSH 0.747 09/05/2017: ALT 7; BUN 20; Creatinine 0.96; Hemoglobin  7.9; Magnesium 1.8; Platelets 361; Potassium 4.6; Sodium 138   Lipid Panel No results found for: CHOL, TRIG, HDL, CHOLHDL, VLDL, LDLCALC, LDLDIRECT  Additional studies/ records that were reviewed today include:   As above  ASSESSMENT & PLAN:    1. Leg/ankle edema - She had bilateral lower extremity edema after agreessive diuresis during admission 07/2017 >> resolved with few days of lasix. No reoccurrence since then. She has mild bilateral ankle edema chronically, likely due to anemia and low albumin. It is possible that she might has age appropriate chronic diastolic dysfunction but does not warrants any evaluation. Discussed PRN lasix however she wants to discuss with her PCP first.   2. DOE - Likely due age, deconditioning and chronic bronchiectasis. No symptoms concerning for angina.   3. Right Renal pelvis metastatic transitional cell carcinoma with metastasis - enlarging right renal mass per oncology.    Revised with DOD Dr. Curt Bears.   Medication Adjustments/Labs and Tests Ordered: Current medicines are reviewed at length with the patient today.  Concerns regarding medicines are outlined above.  Medication changes, Labs and Tests ordered today are listed in the Patient Instructions below. Patient Instructions  Your physician recommends that you continue on your current medications as directed. Please refer to the Current Medication list given to you today.   Your physician recommends that you schedule a follow-up appointment in: CALL AS NEEDED    Signed, Leanor Kail, PA  09/09/2017 3:10 PM    [facility] [address], [place], [place]  [address] Phone: [phone]; Fax: [phone]

## 2017-09-09 NOTE — Patient Instructions (Signed)
Your physician recommends that you continue on your current medications as directed. Please refer to the Current Medication list given to you today.   Your physician recommends that you schedule a follow-up appointment in: CALL AS NEEDED

## 2017-09-15 ENCOUNTER — Other Ambulatory Visit: Payer: Self-pay | Admitting: Hematology

## 2017-09-17 DIAGNOSIS — C689 Malignant neoplasm of urinary organ, unspecified: Secondary | ICD-10-CM | POA: Diagnosis not present

## 2017-09-17 DIAGNOSIS — C7951 Secondary malignant neoplasm of bone: Secondary | ICD-10-CM | POA: Diagnosis not present

## 2017-09-17 DIAGNOSIS — J479 Bronchiectasis, uncomplicated: Secondary | ICD-10-CM | POA: Diagnosis not present

## 2017-09-17 DIAGNOSIS — I10 Essential (primary) hypertension: Secondary | ICD-10-CM | POA: Diagnosis not present

## 2017-09-23 ENCOUNTER — Other Ambulatory Visit: Payer: Self-pay | Admitting: Hematology

## 2017-09-25 NOTE — Progress Notes (Signed)
Hematology oncology clinic follow-up.  Date of service:  09/26/17  Patient Care Team: [name], MD as PCP - General (Internal Medicine)   Urologist: Dr. 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  Treatment -Atezolizumab IV q3weeks  Delton See q12weeks for bone mets  HISTORY OF PRESENTING ILLNESS: Please see my previous notes for details of initial presentation.  INTERVAL HISTORY:   Tiffany [name] is here for her scheduled follow-up and cycle 46 treatment with Atezolizumab. The patient's last visit with [place] was on 09/09/17. She is accompanied today by her husband. The pt reports that she is doing well overall.   The pt reports that she has been feeling better after receiving her blood transfusion. She also notes that she has been able to eat a little more. She notes that she had some diarrhea in the last week and has seen some black stools and blood in the stools as well, though she has been taking Pepto-bismol which has resolved this. She notes there she has had some belly pains on the right side of abdomen, though she associates this with bowel movement timing. She has not yet seen radiation oncology.    Lab results today (09/26/17) of CBC w/diff, CMP is as follows: all values are WNL except for HGB at 11.1, HCT at 33.8, RDW at 19.5, Monocytes abs at 1.2k, Glucose at 125, Albumin at 2.9, GFR at 50. Magnesium 09/26/17 is WNL at 1.9 Ferritin 09/26/17 is elevated at 373 TSH 09/26/17 is WNL at 1.171  On review of systems, pt reports increased energy levels, resolved diarrhea, recent blood in the stools, and denies problems breathing, new skin rashes, leg swelling, and any other symptoms.      MEDICAL HISTORY:  Past Medical History:  Diagnosis Date  . Arthritis     knees  03-10-12 had Cortisone injection  . Cancer [facility]) june 2016   metastatic  . DDD (degenerative disc disease), cervical   . Edema leg    feet and ankles  . Esophagus disorder    Had esophagus stretched  . GERD (gastroesophageal reflux disease)    Benign stricture dilated in 2011  . Gout    Patient notes she has possible gout but has not been on chronic medications for this  . Headache(784.0)   . Hypertension   . Occipital neuralgia   . Occipital neuralgia    Related to cervical degenerative disc disease  . Peptic ulcer disease    Previous history in the remote past  . Ulcerative proctitis ([place])   . Ulcerative proctitis ([place]) 2014    SURGICAL HISTORY: Past Surgical History:  Procedure Laterality Date  . ABDOMINAL HYSTERECTOMY    . APPENDECTOMY    . CHOLECYSTECTOMY    . COLONOSCOPY WITH PROPOFOL  03/25/2012   Procedure: COLONOSCOPY WITH PROPOFOL;  Surgeon: Garlan Fair, MD;  Location: [facility] ENDOSCOPY;  Service: Endoscopy;  Laterality: N/A;  . DILATION AND CURETTAGE OF UTERUS    . ESOPHAGEAL DILATION     For benign stricture in 2011  . ESOPHAGOGASTRODUODENOSCOPY    . FLEXIBLE SIGMOIDOSCOPY N/A 04/26/2014   Procedure: FLEXIBLE SIGMOIDOSCOPY - UnSedated;  Surgeon: Garlan Fair, MD;  Location: [facility];  Service: Endoscopy;  Laterality: N/A;  . FLEXIBLE SIGMOIDOSCOPY N/A 01/24/2015   Procedure: FLEXIBLE SIGNMOIDOSCOPY W/ FLEET ENEMIA;  Surgeon:  Garlan Fair, MD;  Location: [facility];  Service: Endoscopy;  Laterality: N/A;  . FLEXIBLE SIGMOIDOSCOPY N/A 05/15/2016   Procedure: FLEXIBLE SIGMOIDOSCOPY;  Surgeon: Garlan Fair, MD;  Location: [facility];  Service: Endoscopy;  Laterality: N/A;  pt needs to ahve an enema upon arrival   . NECK SURGERY     20 years ago  . TOTAL ABDOMINAL HYSTERECTOMY W/ BILATERAL SALPINGOOPHORECTOMY     At age 70 due to miscarriage and retained products of conception causing significant uterine bleeding. Patient has been on estrogen  replacement therapy since then.    SOCIAL HISTORY: Social History   Socioeconomic History  . Marital status: Married    Spouse name: Not on file  . Number of children: Not on file  . Years of education: Not on file  . Highest education level: Not on file  Occupational History  . Not on file  Social Needs  . Financial resource strain: Not on file  . Food insecurity:    Worry: Not on file    Inability: Not on file  . Transportation needs:    Medical: Not on file    Non-medical: Not on file  Tobacco Use  . Smoking status: Former Smoker    Last attempt to quit: 11/01/1983    Years since quitting: 33.9  . Smokeless tobacco: Never Used  Substance and Sexual Activity  . Alcohol use: Yes    Comment: occ  . Drug use: No  . Sexual activity: Never  Lifestyle  . Physical activity:    Days per week: Not on file    Minutes per session: Not on file  . Stress: Not on file  Relationships  . Social connections:    Talks on phone: Not on file    Gets together: Not on file    Attends religious service: Not on file    Active member of club or organization: Not on file    Attends meetings of clubs or organizations: Not on file    Relationship status: Not on file  . Intimate partner violence:    Fear of current or ex partner: Not on file    Emotionally abused: Not on file    Physically abused: Not on file    Forced sexual activity: Not on file  Other Topics Concern  . Not on file  Social History Narrative  . Not on file  Retired as Conservation officer, nature for [employer]. She is very well spoken and intelligent individual and exhibits a keen knowledge of her medical conditions.  FAMILY HISTORY: Family History  Problem Relation Age of Onset  . Lung cancer Sister   . Prostate cancer Brother   . Uterine cancer Maternal Aunt   . Breast cancer Paternal Grandmother   . Hodgkin's lymphoma Sister     ALLERGIES:  is allergic to nitrofurantoin; indomethacin; and  lisinopril.  MEDICATIONS:  Current Outpatient Medications  Medication Sig Dispense Refill  . acetaminophen (TYLENOL) 650 MG CR tablet Take 1,300 mg by mouth 2 (two) times daily.    Marland Kitchen amLODipine (NORVASC) 5 MG tablet Take 5 mg by mouth daily.    . benzonatate (TESSALON) 100 MG capsule Take 1 capsule (100 mg total) by mouth 3 (three) times daily as needed for cough. 90 capsule 2  . diclofenac sodium (VOLTAREN) 1 % GEL APPLY 2 TO 4 GRAMS Q 6 H PRN FOR PAIN  0  . estradiol (ESTRACE) 0.5 MG tablet Take 0.5 mg by mouth daily.  1  . feeding supplement, ENSURE ENLIVE, (ENSURE ENLIVE) LIQD Take 237 mLs by mouth 2 (two) times daily between meals. 237 mL 12  . furosemide (LASIX) 20 MG tablet Take 1 tablet (20 mg total) by mouth daily for 3 days. 3 tablet 0  . iron polysaccharides (NIFEREX) 150 MG capsule Take 1 capsule (150 mg total) by mouth daily. 30 capsule 1  . mirtazapine (REMERON) 15 MG tablet TAKE 1 TABLET(15 MG) BY MOUTH AT BEDTIME 90 tablet 0  . ondansetron (ZOFRAN ODT) 4 MG disintegrating tablet Take 1 tablet (4 mg total) by mouth every 8 (eight) hours as needed for nausea or vomiting. 20 tablet 0  . PRESCRIPTION MEDICATION Chemo card    . Respiratory Therapy Supplies (FLUTTER) DEVI 1 each by Does not apply route daily. 1 each 0  . triamcinolone ointment (KENALOG) 0.5 % APPLY EXTERNALLY TO THE AFFECTED AREA TWICE DAILY 30 g 0  . vitamin B-12 (CYANOCOBALAMIN) 100 MCG tablet Take 100 mcg by mouth daily.     No current facility-administered medications for this visit.    Facility-Administered Medications Ordered in Other Visits  Medication Dose Route Frequency Provider Last Rate Last Dose  . sodium chloride 0.9 % injection 10 mL  10 mL Intravenous PRN [name], MD   10 mL at 02/28/17 1602   REVIEW OF SYSTEMS:   A 10+ POINT REVIEW OF SYSTEMS WAS OBTAINED including neurology, dermatology, psychiatry, cardiac, respiratory, lymph, extremities, GI, GU, Musculoskeletal, constitutional,  breasts, reproductive, HEENT.  All pertinent positives are noted in the HPI.  All others are negative.   PHYSICAL EXAMINATION: ECOG PERFORMANCE STATUS: 2  Vitals:   09/26/17 0933  BP: (!) 132/55  Pulse: 85  Resp: 18  Temp: 97.9 F (36.6 C)  SpO2: 97%   Filed Weights   09/26/17 0933  Weight: 118 lb 8 oz (53.8 kg)     GENERAL:alert, in no acute distress and comfortable SKIN: no acute rashes, no significant lesions EYES: conjunctiva are pink and non-injected, sclera anicteric OROPHARYNX: MMM, no exudates, no oropharyngeal erythema or ulceration NECK: supple, no JVD LYMPH:  no palpable lymphadenopathy in the cervical, axillary or inguinal regions LUNGS: clear to auscultation b/l with normal respiratory effort HEART: regular rate & rhythm ABDOMEN:  normoactive bowel sounds , non tender, not distended. Extremity: no pedal edema PSYCH: alert & oriented x 3 with fluent speech NEURO: no focal motor/sensory deficits   LABORATORY DATA:   CBC Latest Ref Rng & Units 09/26/2017 09/05/2017 08/15/2017  WBC 3.9 - 10.3 K/uL 8.1 7.2 6.8  Hemoglobin 11.6 - 15.9 g/dL 11.1(L) 7.9(L) 8.3(L)  Hematocrit 34.8 - 46.6 % 33.8(L) 26.3(L) 27.7(L)  Platelets 145 - 400 K/uL 310 361 414(H)   . CBC    Component Value Date/Time   WBC 8.1 09/26/2017 0811   RBC 4.03 09/26/2017 0811   HGB 11.1 (L) 09/26/2017 0811   HGB 8.3 (L) 08/15/2017 1121   HGB 9.0 (L) 03/21/2017 1352   HCT 33.8 (L) 09/26/2017 0811   HCT 29.3 (L) 03/21/2017 1352   PLT 310 09/26/2017 0811   PLT 414 (H) 08/15/2017 1121   PLT 311 03/21/2017 1352   MCV 83.9 09/26/2017 0811   MCV 90.7 03/21/2017 1352   MCH 27.6 09/26/2017 0811   MCHC 32.8 09/26/2017 0811   RDW 19.5 (H) 09/26/2017 0811   RDW 16.3 (H) 03/21/2017 1352   LYMPHSABS 1.4 09/26/2017 0811   LYMPHSABS 1.6 03/21/2017 1352   MONOABS 1.2 (H) 09/26/2017 8676  MONOABS 0.6 03/21/2017 1352   EOSABS 0.0 09/26/2017 0811   EOSABS 0.1 03/21/2017 1352   BASOSABS 0.0 09/26/2017  0811   BASOSABS 0.0 03/21/2017 1352      CMP Latest Ref Rng & Units 09/26/2017 09/05/2017 08/15/2017  Glucose 70 - 99 mg/dL 125(H) 96 95  BUN 8 - 23 mg/dL 22 20 15   Creatinine 0.44 - 1.00 mg/dL 0.97 0.96 0.85  Sodium 135 - 145 mmol/L 136 138 137  Potassium 3.5 - 5.1 mmol/L 4.5 4.6 4.3  Chloride 98 - 111 mmol/L 101 105 104  CO2 22 - 32 mmol/L 27 24 26   Calcium 8.9 - 10.3 mg/dL 9.5 9.3 9.3  Total Protein 6.5 - 8.1 g/dL 7.2 7.3 7.3  Total Bilirubin 0.3 - 1.2 mg/dL 0.4 0.3 0.2  Alkaline Phos 38 - 126 U/L 48 50 57  AST 15 - 41 U/L 15 19 16   ALT 0 - 44 U/L 10 7 8    . Lab Results  Component Value Date   LDH 228 05/11/2016    Radiology .Nm Pet Image Restag (ps) Skull Base To Thigh  Result Date: 09/04/2017 CLINICAL DATA:  Subsequent treatment strategy for carcinoma of right renal pelvis transitional cell carcinoma. Immunotherapy ongoing. EXAM: NUCLEAR MEDICINE PET SKULL BASE TO THIGH TECHNIQUE: 6.7 mCi F-18 FDG was injected intravenously. Full-ring PET imaging was performed from the skull base to thigh after the radiotracer. CT data was obtained and used for attenuation correction and anatomic localization. Fasting blood glucose: 74 mg/dl COMPARISON:  04/04/2017 FINDINGS: Mediastinal blood pool activity: SUV max 1.8 NECK: No hypermetabolic lymph nodes in the neck. Incidental CT findings: none CHEST: No hypermetabolic mediastinal or hilar nodes. No suspicious pulmonary nodules on the CT scan. Incidental CT findings: Port in the anterior chest wall with tip in distal SVC. ABDOMEN/PELVIS: RIGHT renal hilar mass is increased significantly in size measuring 10.6 by 9.7 cm in axial dimension increased from 8.8 x 7.7 cm. The lesion continues demonstrate extremely intense peripheral hypermetabolic activity with SUV max equal 28 compared SUV max equal 29 comparison exam. The lesion is centrally hypometabolic suggesting central necrosis. The hypermetabolic tissue extends into the RIGHT renal pelvis and  proximal RIGHT ureter the level of the pelvic brim. The tumor has progressed inferiorly approximately 2 cm in the ureter compared to the prior exam. No abnormal activity in the RIGHT kidney LEFT kidney. No hypermetabolic abdominopelvic lymph nodes. No abnormal activity liver. Incidental CT findings: Atherosclerotic calcification of the aorta. Post hysterectomy SKELETON: No focal hypermetabolic activity to suggest skeletal metastasis. Incidental CT findings: none IMPRESSION: 1. Considerable enlargement of the RIGHT renal hilar mass with persistent intense peripheral hypermetabolic activity consistent with neoplasm. 2. Extension of neoplasm into the proximal RIGHT ureter with mild inferior progression (2 cm). Electronically Signed   By: Suzy Bouchard M.D.   On: 09/04/2017 09:26    ASSESSMENT & PLAN:   84 year old Caucasian female with  #1 Right Renal pelvis metastatic transitional cell carcinoma.  Initial PET scan revealed metastases to the lungs, liver, multiple nodal stations and bones. -PET scan done after 8 cycles of treatment for restaging disease showed significant response to treatment with no evidence of active disease. Patient has no clinical evidence of disease progression at this time. CT chest 06/16/2015 showed no evidence of cancer progression. PET/CT scan done on 10/21/2015- showed no findings for metastatic disease. New right-sided hydronephrosis versus local tumor recurrence needs to be evaluated further. MRI of the kidney showed hyperenhancing the right renal pelvis lesion  consistent with concern for local recurrence. -PET/CT 03/06/2016 with no findings of metastatic disease. Extensive right-sided hydronephrosis with high activity from excreted FDG obscuring any findings along the collecting system. -PET/CT  06/13/2016- Interval increase in size of right kidney. Findings are suspicious for progression of residual/recurrent urothelial carcinoma with further dilatation of the right renal  collecting system. 2. No evidence for hypermetabolic metastases.  -PET/CT 10/22/2016 - Continued interval progression of the abnormal soft tissue associated with the right kidney. This soft tissue remains markedly hypermetabolic and is compatible with continued progression of recurrent right renal disease. 2. No evidence for hypermetabolic metastases in the neck, chest, abdomen, or pelvis. PET/CT 04/04/2017-  Interval enlargement of and increase in hypermetabolism within the right renal mass, consistent with residual/recurrent disease. Increase in disease progression along the proximal right ureter. 2. No evidence of abdominopelvic nodal or extra abdominal metastatic disease.  09/03/17 PET which revealed 1. Considerable enlargement of the RIGHT renal hilar mass with persistent intense peripheral hypermetabolic activity consistent with neoplasm. 2. Extension of neoplasm into the proximal RIGHT ureter with mild inferior progression (2 cm)  #2 Bone metastases  Plan:  -Discussed pt labwork today, 09/26/17; HGB improved to 11.1 after blood transfusion -Advised that the pt consider OTC probiotics or consider eating cultured yogurt -Will refer pt again to radiation oncology for a discussion regarding palliative radiation to right renal hilar mass  -The pt has no prohibitive toxicities from continuing Atezolizumab at this time.  -continue Atezolizumab q3 weeks as per orders. -continue Xgeva to q12 weeks since she has completed 2 yrs of this treatment already. -transfuse prn for hgb <8 or if symptomatic. -Given progression of right renal hilar mass and worsening anemia, weight loss, low albumin counts, discussed the recommendation for radiation oncology involvement to consider palliative RT given the rest of her disease has been controlled for a long time. -Recommended and discussed meal optimization with pt and her husband    #3 h/o skin rash likely related to her Atezolizumab. Minimal grade 1 and controlled  with topical triamcinolone. Unchanged today.  #4 chronic bronchiectasis with Cough and some clear secretions likely due to bronchiectasis. Followed by Dr Melvyn Novas. -No chest pain no increased shortness of breath or dyspnea on exertion  -Has an action plan with Dr. Melvyn Novas to use when necessary antibiotics for worsening symptoms. -Continue follow-up with Dr. 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 Wynetta Emery.  But stopped using it due to cost issues. Was then on PO budesonide which she has stopped as well. last sigmoidscopy with Dr Wynetta Emery - showed stable ulcerative proctitis  -transfuse prn for hgb<8. Does not report overt rectal bleeding at this time. Plan -if worsening -- rpt stool studies and if no signs of infection can rpt budesonide as needed for symptom control.  #6 Recent Salmonella enteritis -- resolved. diarrhoea resolved.  #7 history of hypertension -on Amlodipine    -continue Teqcentric q3 weeks = please schedule next 4 doses Continue Xgeva q12weeks Radiation oncology referral for consideration of palliative RT to rt renal pelvic mass RTC with Dr Irene Limbo in 3 weeks with labs    All of the patients questions were answered with apparent satisfaction. The patient knows to call the clinic with any problems, questions or concerns.  The toal time spent in the appt was 25 minutes and more than 50% was  on counseling and direct patient cares.   Sullivan Lone MD [place] Hematology/Oncology Physician [facility]  (Office):       [phone] (Work cell):  [phone] (Fax):           [phone]  I, Baldwin Jamaica, am acting as a scribe for Dr Irene Limbo.   .I have reviewed the above documentation for accuracy and completeness, and I agree with the above. [name] MD

## 2017-09-26 ENCOUNTER — Inpatient Hospital Stay: Payer: Medicare Other

## 2017-09-26 ENCOUNTER — Inpatient Hospital Stay: Payer: Medicare Other | Admitting: Hematology

## 2017-09-26 ENCOUNTER — Encounter: Payer: Self-pay | Admitting: Hematology

## 2017-09-26 ENCOUNTER — Other Ambulatory Visit: Payer: Self-pay | Admitting: Hematology

## 2017-09-26 VITALS — BP 132/55 | HR 85 | Temp 97.9°F | Resp 18 | Ht 60.0 in | Wt 118.5 lb

## 2017-09-26 DIAGNOSIS — C787 Secondary malignant neoplasm of liver and intrahepatic bile duct: Secondary | ICD-10-CM

## 2017-09-26 DIAGNOSIS — Z5112 Encounter for antineoplastic immunotherapy: Secondary | ICD-10-CM | POA: Diagnosis not present

## 2017-09-26 DIAGNOSIS — C651 Malignant neoplasm of right renal pelvis: Secondary | ICD-10-CM | POA: Diagnosis not present

## 2017-09-26 DIAGNOSIS — Z95828 Presence of other vascular implants and grafts: Secondary | ICD-10-CM

## 2017-09-26 DIAGNOSIS — C778 Secondary and unspecified malignant neoplasm of lymph nodes of multiple regions: Secondary | ICD-10-CM

## 2017-09-26 DIAGNOSIS — R197 Diarrhea, unspecified: Secondary | ICD-10-CM | POA: Diagnosis not present

## 2017-09-26 DIAGNOSIS — I10 Essential (primary) hypertension: Secondary | ICD-10-CM

## 2017-09-26 DIAGNOSIS — K921 Melena: Secondary | ICD-10-CM

## 2017-09-26 DIAGNOSIS — R21 Rash and other nonspecific skin eruption: Secondary | ICD-10-CM

## 2017-09-26 DIAGNOSIS — C7951 Secondary malignant neoplasm of bone: Secondary | ICD-10-CM

## 2017-09-26 DIAGNOSIS — C7800 Secondary malignant neoplasm of unspecified lung: Secondary | ICD-10-CM | POA: Diagnosis not present

## 2017-09-26 DIAGNOSIS — C659 Malignant neoplasm of unspecified renal pelvis: Secondary | ICD-10-CM

## 2017-09-26 LAB — CBC WITH DIFFERENTIAL/PLATELET
Basophils Absolute: 0 K/uL (ref 0.0–0.1)
Basophils Relative: 1 %
Eosinophils Absolute: 0 K/uL (ref 0.0–0.5)
Eosinophils Relative: 0 %
HCT: 33.8 % — ABNORMAL LOW (ref 34.8–46.6)
Hemoglobin: 11.1 g/dL — ABNORMAL LOW (ref 11.6–15.9)
Lymphocytes Relative: 17 %
Lymphs Abs: 1.4 K/uL (ref 0.9–3.3)
MCH: 27.6 pg (ref 25.1–34.0)
MCHC: 32.8 g/dL (ref 31.5–36.0)
MCV: 83.9 fL (ref 79.5–101.0)
Monocytes Absolute: 1.2 K/uL — ABNORMAL HIGH (ref 0.1–0.9)
Monocytes Relative: 15 %
Neutro Abs: 5.5 K/uL (ref 1.5–6.5)
Neutrophils Relative %: 67 %
Platelets: 310 K/uL (ref 145–400)
RBC: 4.03 MIL/uL (ref 3.70–5.45)
RDW: 19.5 % — ABNORMAL HIGH (ref 11.2–14.5)
WBC: 8.1 K/uL (ref 3.9–10.3)

## 2017-09-26 LAB — CMP (CANCER CENTER ONLY)
ALT: 10 U/L (ref 0–44)
AST: 15 U/L (ref 15–41)
Albumin: 2.9 g/dL — ABNORMAL LOW (ref 3.5–5.0)
Alkaline Phosphatase: 48 U/L (ref 38–126)
Anion gap: 8 (ref 5–15)
BILIRUBIN TOTAL: 0.4 mg/dL (ref 0.3–1.2)
BUN: 22 mg/dL (ref 8–23)
CALCIUM: 9.5 mg/dL (ref 8.9–10.3)
CO2: 27 mmol/L (ref 22–32)
Chloride: 101 mmol/L (ref 98–111)
Creatinine: 0.97 mg/dL (ref 0.44–1.00)
GFR, EST NON AFRICAN AMERICAN: 50 mL/min — AB (ref >60)
GFR, Est AFR Am: 57 mL/min — ABNORMAL LOW (ref >60)
Glucose, Bld: 125 mg/dL — ABNORMAL HIGH (ref 70–99)
POTASSIUM: 4.5 mmol/L (ref 3.5–5.1)
Sodium: 136 mmol/L (ref 135–145)
TOTAL PROTEIN: 7.2 g/dL (ref 6.5–8.1)

## 2017-09-26 LAB — SAMPLE TO BLOOD BANK

## 2017-09-26 LAB — MAGNESIUM: MAGNESIUM: 1.9 mg/dL (ref 1.7–2.4)

## 2017-09-26 LAB — TSH: TSH: 1.171 u[IU]/mL (ref 0.308–3.960)

## 2017-09-26 LAB — FERRITIN: Ferritin: 373 ng/mL — ABNORMAL HIGH (ref 11–307)

## 2017-09-26 MED ORDER — SODIUM CHLORIDE 0.9 % IV SOLN
Freq: Once | INTRAVENOUS | Status: AC
  Administered 2017-09-26: 11:00:00 via INTRAVENOUS

## 2017-09-26 MED ORDER — SODIUM CHLORIDE 0.9 % IV SOLN
1200.0000 mg | Freq: Once | INTRAVENOUS | Status: AC
  Administered 2017-09-26: 1200 mg via INTRAVENOUS
  Filled 2017-09-26: qty 20

## 2017-09-26 MED ORDER — HEPARIN SOD (PORK) LOCK FLUSH 100 UNIT/ML IV SOLN
500.0000 [IU] | Freq: Once | INTRAVENOUS | Status: AC | PRN
  Administered 2017-09-26: 500 [IU]
  Filled 2017-09-26: qty 5

## 2017-09-26 MED ORDER — DENOSUMAB 120 MG/1.7ML ~~LOC~~ SOLN: 120.0000 mg | Freq: Once | SUBCUTANEOUS | Status: DC

## 2017-09-26 MED ORDER — SODIUM CHLORIDE 0.9 % IJ SOLN
10.0000 mL | INTRAMUSCULAR | Status: DC | PRN
  Administered 2017-09-26: 10 mL
  Filled 2017-09-26: qty 10

## 2017-09-26 MED ORDER — SODIUM CHLORIDE 0.9 % IJ SOLN
10.0000 mL | INTRAMUSCULAR | Status: DC | PRN
  Administered 2017-09-26: 10 mL via INTRAVENOUS
  Filled 2017-09-26: qty 10

## 2017-09-26 NOTE — Patient Instructions (Signed)
[facility] Discharge Instructions for Patients Receiving Chemotherapy  Today you received the following chemotherapy agents Tecentriq To help prevent nausea and vomiting after your treatment, we encourage you to take your nausea medication as prescribed.   If you develop nausea and vomiting that is not controlled by your nausea medication, call the clinic.   BELOW ARE SYMPTOMS THAT SHOULD BE REPORTED IMMEDIATELY:  *FEVER GREATER THAN 100.5 F  *CHILLS WITH OR WITHOUT FEVER  NAUSEA AND VOMITING THAT IS NOT CONTROLLED WITH YOUR NAUSEA MEDICATION  *UNUSUAL SHORTNESS OF BREATH  *UNUSUAL BRUISING OR BLEEDING  TENDERNESS IN MOUTH AND THROAT WITH OR WITHOUT PRESENCE OF ULCERS  *URINARY PROBLEMS  *BOWEL PROBLEMS  UNUSUAL RASH Items with * indicate a potential emergency and should be followed up as soon as possible.  Feel free to call the clinic should you have any questions or concerns. The clinic phone number is [phone].  Please show the CHEMO ALERT CARD at check-in to the Emergency Department and triage nurse.   

## 2017-09-26 NOTE — Progress Notes (Signed)
GU Location of Tumor / Histology: Transitional cell carcinoma of right renal pelvis ,enlarging right renal mass    MERICA PRELL presented three months ago with signs/symptoms QI:ONGEXBM and somewhat uncomfortable right renal pelvis pain.   She has received recent blood transfusion and is feeling better she has been able to eat a little more. She notes that she had some diarrhea in the last week and has seen some black stools and blood in the stools as well, though she has been taking Pepto-bismol which has resolved this. She notes there she has had some belly pains on the right side of abdomen, though she associates this with bowel movement timing.   Biopsies of :AL DIAGNOSIS Diagnosis 11-22-14 Liver, needle/core biopsy - METASTATIC POORLY DIFFERENTIATED CARCINOMA, SEE COMMENT. Microscopic Comment The needle core biopsies demonstrate extensive involvement by metastatic poorly differentiated carcinoma with the following immunophenotype: Cytokeratin 7 - strong diffuse expression. Cytokeratin 20 - strong diffuse expression. estrogen receptor - negative expression. GCDFP - negative expression. Hep Par - negative expression. TTF-1 - negative expression. CDX2 - patchy moderate to strong expression. polyclonal CEA - strong diffuse expression (cytoplasmic and membranous). The previous urine cytology demonstrating atypical cells suspicious for urothelial carcinoma is noted ([id number]). In the current case, the morphology is that of poorly differentiated    Past/Anticipated interventions by urology, if any:   Presently under the care of Dr. Rogue Bussing who she has not seen at the [facility] office per the office staff. Saw Dr. Phebe Colla last 11-10-15 who feels there is no role for any sort of heroic surgery.   Past/Anticipated interventions by medical oncology, if any: Dr. Irene Limbo  The pt would like to meet with radiation oncology and discuss pallaitive RT treatment options. -Recommended and  discussed meal optimization with pt and her husband    Lab results today (09/26/17) of CBC w/diff, CMP is as follows: all values are WNL except for HGB at 11.1, HCT at 33.8, RDW at 19.5, Monocytes abs at 1.2k, Glucose at 125, Albumin at 2.9, GFR at 50. Magnesium 09/26/17 is WNL at 1.9 Ferritin 09/26/17 is elevated at 373 TSH 09/26/17 is WNL at 1.171   Treatment -Atezolizumab IV q3weeks  Delton See q12weeks for bone mets    09/05/17; Albumin at 2.8, HGB decreased to 7.9 -Will order blood transfusion of 2 PRBCs   IMPRESSION:09-03-17 PET 1. Considerable enlargement of the RIGHT renal hilar mass with persistent intense peripheral hypermetabolic activity consistent with neoplasm. 2. Extension of neoplasm into the proximal RIGHT ureter with mild inferior progression (2 cm).   Prior Treatment Initial PET scan revealed metastases to the lungs, liver, multiple nodal stations and bones. -PET scan done after 8 cycles of treatment for restaging disease showed significant response to treatment with no evidence of active disease. Patient has no clinical evidence of disease progression at this time. CT chest 06/16/2015 showed no evidence of cancer progression. PET/CT scan done on 10/21/2015- showed no findings for metastatic disease. New right-sided hydronephrosis versus local tumor recurrence needs to be evaluated further. MRI of the kidney showed hyperenhancing the right renal pelvis lesion consistent with concern for local recurrence. -PET/CT 03/06/2016 with no findings of metastatic disease. Extensive right-sided hydronephrosis with high activity from excreted FDG obscuring any findings along the collecting system. -PET/CT  06/13/2016- Interval increase in size of right kidney. Findings are suspicious for progression of residual/recurrent urothelial carcinoma with further dilatation of the right renal collecting system. 2. No evidence for hypermetabolic metastases.  -PET/CT 10/22/2016 - Continued interval  progression of the abnormal soft tissue associated with the right kidney. This soft tissue remains markedly hypermetabolic and is compatible with continued progression of recurrent right renal disease. 2. No evidence for hypermetabolic metastases in the neck, chest, abdomen, or pelvis.  PET/CT 04/04/2017-  Interval enlargement of and increase in hypermetabolism within the right renal mass, consistent with residual/recurrent disease. Increase in disease progression along the proximal right ureter. 2. No evidence of abdominopelvic nodal or extra abdominal metastatic disease.  #2 Bone metastases  Weight changes, if any:  Wt Readings from Last 3 Encounters:  09/27/17 115 lb 12.8 oz (52.5 kg)  09/26/17 118 lb 8 oz (53.8 kg)  09/09/17 119 lb 6.4 oz (54.2 kg)    Bowel/Bladder complaints, if any: Had diarrhea last week now having more formed stools and having more frequent bowel movements which has occurred for years.  Urinating every 2-3 hours during the day. Nocturia times two.  Nausea/Vomiting, if any: No  Pain issues, if any:    SAFETY ISSUES:  Prior radiation? :No  Pacemaker/ICD?:No  Possible current pregnancy? : No  Is the patient on methotrexate? : No  Current Complaints / other details:   BP (!) 133/59 (BP Location: Right Arm, Patient Position: Sitting, Cuff Size: Normal)   Pulse 80   Temp 98.1 F (36.7 C) (Oral)   Resp 18   Ht (!) 5" (0.127 m)   Wt 115 lb 12.8 oz (52.5 kg)   SpO2 96%   BMI 3256.65 kg/m

## 2017-09-27 ENCOUNTER — Ambulatory Visit
Admission: RE | Admit: 2017-09-27 | Discharge: 2017-09-27 | Disposition: A | Discharge status: Home / Self Care | Payer: Medicare Other | Source: Ambulatory Visit | Attending: Radiation Oncology | Admitting: Radiation Oncology

## 2017-09-27 ENCOUNTER — Other Ambulatory Visit: Payer: Self-pay

## 2017-09-27 ENCOUNTER — Encounter: Payer: Self-pay | Admitting: Urology

## 2017-09-27 ENCOUNTER — Telehealth: Payer: Self-pay

## 2017-09-27 ENCOUNTER — Ambulatory Visit
Admission: RE | Admit: 2017-09-27 | Discharge: 2017-09-27 | Disposition: A | Discharge status: Home / Self Care | Payer: Medicare Other | Source: Ambulatory Visit | Attending: Urology | Admitting: Urology

## 2017-09-27 VITALS — BP 133/59 | HR 80 | Temp 98.1°F | Resp 18 | Ht <= 58 in | Wt 115.8 lb

## 2017-09-27 DIAGNOSIS — Z801 Family history of malignant neoplasm of trachea, bronchus and lung: Secondary | ICD-10-CM | POA: Insufficient documentation

## 2017-09-27 DIAGNOSIS — Z8042 Family history of malignant neoplasm of prostate: Secondary | ICD-10-CM | POA: Insufficient documentation

## 2017-09-27 DIAGNOSIS — Z9049 Acquired absence of other specified parts of digestive tract: Secondary | ICD-10-CM | POA: Insufficient documentation

## 2017-09-27 DIAGNOSIS — R109 Unspecified abdominal pain: Secondary | ICD-10-CM | POA: Diagnosis not present

## 2017-09-27 DIAGNOSIS — Z79899 Other long term (current) drug therapy: Secondary | ICD-10-CM | POA: Insufficient documentation

## 2017-09-27 DIAGNOSIS — Z808 Family history of malignant neoplasm of other organs or systems: Secondary | ICD-10-CM | POA: Insufficient documentation

## 2017-09-27 DIAGNOSIS — Z90710 Acquired absence of both cervix and uterus: Secondary | ICD-10-CM | POA: Insufficient documentation

## 2017-09-27 DIAGNOSIS — K219 Gastro-esophageal reflux disease without esophagitis: Secondary | ICD-10-CM | POA: Insufficient documentation

## 2017-09-27 DIAGNOSIS — Z803 Family history of malignant neoplasm of breast: Secondary | ICD-10-CM | POA: Insufficient documentation

## 2017-09-27 DIAGNOSIS — I10 Essential (primary) hypertension: Secondary | ICD-10-CM | POA: Diagnosis not present

## 2017-09-27 DIAGNOSIS — C651 Malignant neoplasm of right renal pelvis: Secondary | ICD-10-CM | POA: Diagnosis not present

## 2017-09-27 DIAGNOSIS — Z87891 Personal history of nicotine dependence: Secondary | ICD-10-CM | POA: Insufficient documentation

## 2017-09-27 DIAGNOSIS — M109 Gout, unspecified: Secondary | ICD-10-CM | POA: Insufficient documentation

## 2017-09-27 DIAGNOSIS — Z881 Allergy status to other antibiotic agents status: Secondary | ICD-10-CM | POA: Insufficient documentation

## 2017-09-27 DIAGNOSIS — Z888 Allergy status to other drugs, medicaments and biological substances status: Secondary | ICD-10-CM | POA: Diagnosis not present

## 2017-09-27 DIAGNOSIS — N2889 Other specified disorders of kidney and ureter: Secondary | ICD-10-CM | POA: Diagnosis not present

## 2017-09-27 NOTE — Telephone Encounter (Signed)
Mailed letter and calender. Per 6/28 los

## 2017-09-27 NOTE — Progress Notes (Signed)
Radiation Oncology         [phone] ________________________________  Outpatient Re-Consultation  Name: Tiffany [name] MRN: [id number]  Date of Service: 09/27/2017 DOB: Sep 08, 1924  NA:[id number], Jori Moll, MD  [name], MD   REFERRING PHYSICIAN: [name], MD  DIAGNOSIS: 83 yo woman with a symptomatic 10.6 cm right renal hilar cancer    ICD-10-CM   1. Transitional cell carcinoma of right renal pelvis (HCC) C65.1     HISTORY OF PRESENT ILLNESS: Tiffany [name] is a 84 y.o. 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 Irene Limbo.  She was initially diagnosed in August 2016 with a CT-guided biopsy of 1 of her liver lesions which returned transitional cell carcinoma from the right renal pelvis.  She has since been treated with with Atezolizumab IV q3weeks and is currently status post completion of cycle 46.  She had an initial excellent response to treatment but showed evidence of cancer progression on a follow up PET CT scan on 10/21/2015 with new right-sided hydronephrosis versus local tumor recurrence needs to be evaluated further. MRI of the kidney showed hyper-enhancment in the right renal pelvis lesion consistent with concern for local recurrence.    She presented in consult to radiation oncology in September 2017 for consideration of palliative radiation at that time but was not having hematuria or flank pain related to the lesion so the decision was to proceed with systemic therapy only and reserve radiotherapy should she become symptomatic.  Since that time, follow-up PET scans have shown a gradual increase in size of the right renal pelvic lesion with marked hypermetabolic activity compatible with progression of recurrent right renal disease.  Her most recent PET CT scan on 09/03/2017 showed significant increase in size of the right renal hilar mass measuring 10.6 x 9.7 cm in axial dimension which was increased  from 8.8 x 7.7 cm on prior PET scan from January 2019. The lesion is centrally hypometabolic suggesting central necrosis. The hypermetabolic tissue extends into the RIGHT renal pelvis and proximal RIGHT ureter the level of the pelvic brim. The tumor has progressed inferiorly approximately 2 cm in the ureter compared to the prior exam. There were no hypermetabolic lymph nodes in the neck, no hypermetabolic mediastinal or hilar nodes, pulmonary nodules or focal hypermetabolic activity to suggest skeletal metastasis.  She has continued on immunotherapy which overall, she tolerates well.  Systemically, her disease appears well controlled aside from the enlarging right renal hilar mass.  She reports that she is having intermittent right flank pain/pressure, occurring more frequently and associated with the enlarging mass.  Therefore, she has been referred back to radiation oncology for consult today to revisit the potential for palliative radiotherapy to the right renal mass in the management of her disease.  PREVIOUS RADIATION THERAPY: No  PAST MEDICAL HISTORY:  Past Medical History:  Diagnosis Date  . Arthritis     knees 03-10-12 had Cortisone injection  . Cancer [facility]) june 2016   metastatic  . DDD (degenerative disc disease), cervical   . Edema leg    feet and ankles  . Esophagus disorder    Had esophagus stretched  . GERD (gastroesophageal reflux disease)    Benign stricture dilated in 2011  . Gout    Patient notes she has possible gout but has not been on chronic medications for this  . Headache(784.0)   . Hypertension   . Occipital neuralgia   .  Occipital neuralgia    Related to cervical degenerative disc disease  . Peptic ulcer disease    Previous history in the remote past  . Ulcerative proctitis ([place])   . Ulcerative proctitis ([place]) 2014      PAST SURGICAL HISTORY: Past Surgical History:  Procedure Laterality Date  . ABDOMINAL HYSTERECTOMY    . APPENDECTOMY    .  CHOLECYSTECTOMY    . COLONOSCOPY WITH PROPOFOL  03/25/2012   Procedure: COLONOSCOPY WITH PROPOFOL;  Surgeon: Garlan Fair, MD;  Location: [facility];  Service: Endoscopy;  Laterality: N/A;  . DILATION AND CURETTAGE OF UTERUS    . ESOPHAGEAL DILATION     For benign stricture in 2011  . ESOPHAGOGASTRODUODENOSCOPY    . FLEXIBLE SIGMOIDOSCOPY N/A 04/26/2014   Procedure: FLEXIBLE SIGMOIDOSCOPY - UnSedated;  Surgeon: Garlan Fair, MD;  Location: [facility];  Service: Endoscopy;  Laterality: N/A;  . FLEXIBLE SIGMOIDOSCOPY N/A 01/24/2015   Procedure: FLEXIBLE SIGNMOIDOSCOPY W/ FLEET ENEMIA;  Surgeon: Garlan Fair, MD;  Location: [facility];  Service: Endoscopy;  Laterality: N/A;  . FLEXIBLE SIGMOIDOSCOPY N/A 05/15/2016   Procedure: FLEXIBLE SIGMOIDOSCOPY;  Surgeon: Garlan Fair, MD;  Location: [facility];  Service: Endoscopy;  Laterality: N/A;  pt needs to ahve an enema upon arrival   . NECK SURGERY     20 years ago  . TOTAL ABDOMINAL HYSTERECTOMY W/ BILATERAL SALPINGOOPHORECTOMY     At age 71 due to miscarriage and retained products of conception causing significant uterine bleeding. Patient has been on estrogen replacement therapy since then.    FAMILY HISTORY:  Family History  Problem Relation Age of Onset  . Lung cancer Sister   . Prostate cancer Brother   . Uterine cancer Maternal Aunt   . Breast cancer Paternal Grandmother   . Hodgkin's lymphoma Sister     SOCIAL HISTORY:  Social History   Socioeconomic History  . Marital status: Married    Spouse name: Not on file  . Number of children: Not on file  . Years of education: Not on file  . Highest education level: Not on file  Occupational History  . Not on file  Social Needs  . Financial resource strain: Not on file  . Food insecurity:    Worry: Not on file    Inability: Not on file  . Transportation needs:    Medical: Not on file    Non-medical: Not on file  Tobacco Use  . Smoking status: Former  Smoker    Last attempt to quit: 11/01/1983    Years since quitting: 33.9  . Smokeless tobacco: Never Used  Substance and Sexual Activity  . Alcohol use: Yes    Comment: occ  . Drug use: No  . Sexual activity: Never  Lifestyle  . Physical activity:    Days per week: Not on file    Minutes per session: Not on file  . Stress: Not on file  Relationships  . Social connections:    Talks on phone: Not on file    Gets together: Not on file    Attends religious service: Not on file    Active member of club or organization: Not on file    Attends meetings of clubs or organizations: Not on file    Relationship status: Not on file  . Intimate partner violence:    Fear of current or ex partner: Not on file    Emotionally abused: Not on file    Physically abused: Not on  file    Forced sexual activity: Not on file  Other Topics Concern  . Not on file  Social History Narrative  . Not on file    ALLERGIES: Nitrofurantoin; Indomethacin; and Lisinopril  MEDICATIONS:  Current Outpatient Medications  Medication Sig Dispense Refill  . acetaminophen (TYLENOL) 650 MG CR tablet Take 1,300 mg by mouth 2 (two) times daily.    Marland Kitchen amLODipine (NORVASC) 5 MG tablet Take 5 mg by mouth daily.    . benzonatate (TESSALON) 100 MG capsule Take 1 capsule (100 mg total) by mouth 3 (three) times daily as needed for cough. (Patient not taking: Reported on 09/27/2017) 90 capsule 2  . diclofenac sodium (VOLTAREN) 1 % GEL APPLY 2 TO 4 GRAMS Q 6 H PRN FOR PAIN  0  . estradiol (ESTRACE) 0.5 MG tablet Take 0.5 mg by mouth daily.  1  . feeding supplement, ENSURE ENLIVE, (ENSURE ENLIVE) LIQD Take 237 mLs by mouth 2 (two) times daily between meals. 237 mL 12  . furosemide (LASIX) 20 MG tablet Take 1 tablet (20 mg total) by mouth daily for 3 days. 3 tablet 0  . iron polysaccharides (NIFEREX) 150 MG capsule Take 1 capsule (150 mg total) by mouth daily. (Patient not taking: Reported on 09/27/2017) 30 capsule 1  . mirtazapine  (REMERON) 15 MG tablet TAKE 1 TABLET(15 MG) BY MOUTH AT BEDTIME 90 tablet 0  . ondansetron (ZOFRAN ODT) 4 MG disintegrating tablet Take 1 tablet (4 mg total) by mouth every 8 (eight) hours as needed for nausea or vomiting. (Patient not taking: Reported on 09/27/2017) 20 tablet 0  . PRESCRIPTION MEDICATION Chemo card    . Respiratory Therapy Supplies (FLUTTER) DEVI 1 each by Does not apply route daily. 1 each 0  . triamcinolone ointment (KENALOG) 0.5 % APPLY EXTERNALLY TO THE AFFECTED AREA TWICE DAILY 30 g 0  . vitamin B-12 (CYANOCOBALAMIN) 100 MCG tablet Take 100 mcg by mouth daily.     No current facility-administered medications for this encounter.    Facility-Administered Medications Ordered in Other Encounters  Medication Dose Route Frequency Provider Last Rate Last Dose  . sodium chloride 0.9 % injection 10 mL  10 mL Intravenous PRN [name], MD   10 mL at 02/28/17 1602    REVIEW OF SYSTEMS:  On review of systems, the patient reports that she is doing well overall.  Her only complaint is intermittent right flank pain.  The right flank is tender to palpation and makes it difficult to sleep on her right side.  She will have intermittent occasional twinges of pain in the right flank throughout the day but the pain is not constant.  She rates the pain as a 4-5 out of 10 in severity.  She denies any associated gross hematuria, dysuria or suprapubic discomfort.  She denies any chest pain, shortness of breath, cough, fevers, chills, night sweats.  She has had a recent weight loss of approximately 5 to 8 pounds due to an episode of Salmonella in April 2019 which she continues to recover from.  She currently denies any bowel or bladder disturbances, and denies abdominal pain, nausea or vomiting. She denies any new musculoskeletal or joint aches or pains aside from the right flank discomfort. A complete review of systems is obtained and is otherwise negative.    PHYSICAL EXAM:  Wt Readings  from Last 3 Encounters:  09/27/17 115 lb 12.8 oz (52.5 kg)  09/26/17 118 lb 8 oz (53.8 kg)  09/09/17 119 lb  6.4 oz (54.2 kg)   Temp Readings from Last 3 Encounters:  09/27/17 98.1 F (36.7 C) (Oral)  09/26/17 97.9 F (36.6 C) (Oral)  09/07/17 98.2 F (36.8 C) (Oral)   BP Readings from Last 3 Encounters:  09/27/17 (!) 133/59  09/26/17 (!) 132/55  09/09/17 140/70   Pulse Readings from Last 3 Encounters:  09/27/17 80  09/26/17 85  09/09/17 75    /10  In general this is a well appearing female in no acute distress. She is alert and oriented x4 and appropriate throughout the examination. HEENT reveals that the patient is normocephalic, atraumatic. EOMs are intact. PERRLA. Skin is intact without any evidence of gross lesions. Cardiovascular exam reveals a regular rate and rhythm, no clicks rubs or murmurs are auscultated. Chest is clear to auscultation bilaterally. Lymphatic assessment is performed and does not reveal any adenopathy in the cervical, supraclavicular, axillary, or inguinal chains. Abdomen has active bowel sounds in all quadrants and is intact. The abdomen is soft, non tender, non distended. Lower extremities are negative for pretibial pitting edema, deep calf tenderness, cyanosis or clubbing.   KPS = 80  100 - Normal; no complaints; no evidence of disease. 90   - Able to carry on normal activity; minor signs or symptoms of disease. 80   - Normal activity with effort; some signs or symptoms of disease. 2   - Cares for self; unable to carry on normal activity or to do active work. 60   - Requires occasional assistance, but is able to care for most of his personal needs. 50   - Requires considerable assistance and frequent medical care. 24   - Disabled; requires special care and assistance. 56   - Severely disabled; hospital admission is indicated although death not imminent. 8   - Very sick; hospital admission necessary; active supportive treatment necessary. 10   -  Moribund; fatal processes progressing rapidly. 0     - Dead  Karnofsky DA, Abelmann [place], Craver LS and Burchenal [facility] [phone]) The use of the nitrogen mustards in the palliative treatment of carcinoma: with particular reference to bronchogenic carcinoma Cancer 1 [id number]  LABORATORY DATA:  Lab Results  Component Value Date   WBC 8.1 09/26/2017   HGB 11.1 (L) 09/26/2017   HCT 33.8 (L) 09/26/2017   MCV 83.9 09/26/2017   PLT 310 09/26/2017   Lab Results  Component Value Date   NA 136 09/26/2017   K 4.5 09/26/2017   CL 101 09/26/2017   CO2 27 09/26/2017   Lab Results  Component Value Date   ALT 10 09/26/2017   AST 15 09/26/2017   ALKPHOS 48 09/26/2017   BILITOT 0.4 09/26/2017     RADIOGRAPHY: Nm Pet Image Restag (ps) Skull Base To Thigh  Result Date: 09/04/2017 CLINICAL DATA:  Subsequent treatment strategy for carcinoma of right renal pelvis transitional cell carcinoma. Immunotherapy ongoing. EXAM: NUCLEAR MEDICINE PET SKULL BASE TO THIGH TECHNIQUE: 6.7 mCi F-18 FDG was injected intravenously. Full-ring PET imaging was performed from the skull base to thigh after the radiotracer. CT data was obtained and used for attenuation correction and anatomic localization. Fasting blood glucose: 74 mg/dl COMPARISON:  04/04/2017 FINDINGS: Mediastinal blood pool activity: SUV max 1.8 NECK: No hypermetabolic lymph nodes in the neck. Incidental CT findings: none CHEST: No hypermetabolic mediastinal or hilar nodes. No suspicious pulmonary nodules on the CT scan. Incidental CT findings: Port in the anterior chest wall with tip in distal SVC. ABDOMEN/PELVIS: RIGHT renal hilar mass  is increased significantly in size measuring 10.6 by 9.7 cm in axial dimension increased from 8.8 x 7.7 cm. The lesion continues demonstrate extremely intense peripheral hypermetabolic activity with SUV max equal 28 compared SUV max equal 29 comparison exam. The lesion is centrally hypometabolic suggesting central necrosis. The  hypermetabolic tissue extends into the RIGHT renal pelvis and proximal RIGHT ureter the level of the pelvic brim. The tumor has progressed inferiorly approximately 2 cm in the ureter compared to the prior exam. No abnormal activity in the RIGHT kidney LEFT kidney. No hypermetabolic abdominopelvic lymph nodes. No abnormal activity liver. Incidental CT findings: Atherosclerotic calcification of the aorta. Post hysterectomy SKELETON: No focal hypermetabolic activity to suggest skeletal metastasis. Incidental CT findings: none IMPRESSION: 1. Considerable enlargement of the RIGHT renal hilar mass with persistent intense peripheral hypermetabolic activity consistent with neoplasm. 2. Extension of neoplasm into the proximal RIGHT ureter with mild inferior progression (2 cm). Electronically Signed   By: Suzy Bouchard M.D.   On: 09/04/2017 09:26      IMPRESSION/PLAN: 1. 84 y.o. 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   Today, we talked to the patient and her husband about the findings and workup thus far.  Her systemic disease otherwise appears to be under good control on her current immunotherapy.  We discussed the natural history of metastatic transitional cell carcinoma and general treatment, highlighting the role of  radiotherapy in the management. We discussed the available radiation techniques, and focused on the details of logistics and delivery.  The recommendation is for a 10-day course of daily radiotherapy to the right renal pelvic lesion over  a 2-week period of time.  We reviewed the anticipated acute and late sequelae associated with radiation in this setting. The patient was encouraged to ask questions that were answered to her satisfaction.    At the conclusion of our discussion, the patient elects to proceed with palliative radiotherapy to the right renal pelvic mass as recommended.  She has freely signed written  consent today in the office and a copy of this document has been placed in her medical record.  We will share our findings with Dr. Irene Limbo and move forward accordingly.  She will proceed with CT simulation/treatment planning following our visit today in anticipation of beginning treatment early next week.  She knows to call at anytime with any questions or concerns in the interim.     Nicholos Johns, PA-C    Tyler Pita, MD  [facility] Direct Dial: [phone]  Fax: [phone] [email or web address]  Skype  LinkedIn    Page Me     This document serves as a record of services personally performed by Tyler Pita, MD and Freeman Caldron, PA-C. It was created on their behalf by Margit Banda, a trained medical scribe. The creation of this record is based on the scribe's personal observations and the provider's statements to them. This document has been checked and approved by the attending provider.

## 2017-09-29 NOTE — Addendum Note (Signed)
Encounter addended by: Tyler Pita, MD on: 09/29/2017 4:28 PM  Actions taken: Sign clinical note

## 2017-09-29 NOTE — Progress Notes (Signed)
  Radiation Oncology         [phone] ________________________________  Name: Tiffany [name] MRN: [id number]  Date: 09/27/2017  DOB: 19-Aug-1924  SIMULATION AND TREATMENT PLANNING NOTE    ICD-10-CM   1. Transitional cell carcinoma of right renal pelvis (HCC) C65.1     DIAGNOSIS:  84 yo woman with a symptomatic 10.6 cm right renal hilar cancer  NARRATIVE:  The patient was brought to the [facility].  Identity was confirmed.  All relevant records and images related to the planned course of therapy were reviewed.  The patient freely provided informed written consent to proceed with treatment after reviewing the details related to the planned course of therapy. The consent form was witnessed and verified by the simulation staff.  Then, the patient was set-up in a stable reproducible  supine position for radiation therapy.  CT images were obtained.  Surface markings were placed.  The CT images were loaded into the planning software.  Then the target and avoidance structures were contoured.  Treatment planning then occurred.  The radiation prescription was entered and confirmed.  Then, I designed and supervised the construction of a total of one medically necessary complex treatment set-up device and anticipated other devices with planning.  I have requested : 3D Simulation  I have requested a DVH of the following structures: Left kidney, liver, spinal cord and target.   PLAN:  The patient will receive 30 Gy in 10 fractions.  ________________________________  Sheral Apley Tammi Klippel, M.D.

## 2017-10-02 DIAGNOSIS — Z510 Encounter for antineoplastic radiation therapy: Secondary | ICD-10-CM | POA: Diagnosis not present

## 2017-10-02 DIAGNOSIS — C651 Malignant neoplasm of right renal pelvis: Secondary | ICD-10-CM | POA: Diagnosis not present

## 2017-10-07 ENCOUNTER — Ambulatory Visit: Admission: RE | Admit: 2017-10-07 | Payer: Medicare Other | Source: Ambulatory Visit

## 2017-10-08 ENCOUNTER — Other Ambulatory Visit: Payer: Self-pay | Admitting: *Deleted

## 2017-10-08 ENCOUNTER — Telehealth: Payer: Self-pay | Admitting: Radiation Oncology

## 2017-10-08 ENCOUNTER — Telehealth: Payer: Self-pay

## 2017-10-08 ENCOUNTER — Ambulatory Visit: Payer: Medicare Other

## 2017-10-08 ENCOUNTER — Other Ambulatory Visit: Payer: Self-pay

## 2017-10-08 DIAGNOSIS — C651 Malignant neoplasm of right renal pelvis: Secondary | ICD-10-CM

## 2017-10-08 DIAGNOSIS — Z510 Encounter for antineoplastic radiation therapy: Secondary | ICD-10-CM | POA: Diagnosis not present

## 2017-10-08 LAB — C DIFFICILE QUICK SCREEN W PCR REFLEX
C DIFFICILE (CDIFF) INTERP: DETECTED
C DIFFICILE (CDIFF) TOXIN: POSITIVE — AB
C DIFFICLE (CDIFF) ANTIGEN: POSITIVE — AB

## 2017-10-08 NOTE — Telephone Encounter (Signed)
Patient called stating she has been having episodes of diarrhea and pepto-bismol and imodium are not helping. Patient states she feels fine but keeps having diarrhea. Dr. Irene Limbo made aware and ordered GI panel and stool study. Patient made aware of orders and will have someone pick up specimen container.

## 2017-10-08 NOTE — Telephone Encounter (Signed)
Received voicemail message from patient requesting a return call. Phoned patient back. She wishes to cancel port and treat appointment for today because she was up the majority of the night with diarrhea despite pepto bismol and Imodium. Encouraged patient to follow up with PCP to determine source of diarrhea since April. In addition, patient reported she has reached out to Dr. Irene Limbo for direction. Patient feels her radiation should be held until her diarrhea is under control. Patient understands this RN will cancel her radiation for today and tomorrow then, call Thursday morning to check status.

## 2017-10-09 ENCOUNTER — Telehealth: Payer: Self-pay

## 2017-10-09 ENCOUNTER — Ambulatory Visit: Payer: Medicare Other

## 2017-10-09 ENCOUNTER — Other Ambulatory Visit: Payer: Self-pay | Admitting: Hematology

## 2017-10-09 LAB — GASTROINTESTINAL PANEL BY PCR, STOOL (REPLACES STOOL CULTURE)
ASTROVIRUS: NOT DETECTED
Adenovirus F40/41: NOT DETECTED
CAMPYLOBACTER SPECIES: NOT DETECTED
CRYPTOSPORIDIUM: NOT DETECTED
CYCLOSPORA CAYETANENSIS: NOT DETECTED
ENTAMOEBA HISTOLYTICA: NOT DETECTED
Enteroaggregative E coli (EAEC): NOT DETECTED
Enteropathogenic E coli (EPEC): NOT DETECTED
Enterotoxigenic E coli (ETEC): NOT DETECTED
Giardia lamblia: NOT DETECTED
Norovirus GI/GII: NOT DETECTED
PLESIMONAS SHIGELLOIDES: NOT DETECTED
Rotavirus A: NOT DETECTED
SAPOVIRUS (I, II, IV, AND V): NOT DETECTED
SHIGA LIKE TOXIN PRODUCING E COLI (STEC): NOT DETECTED
Salmonella species: DETECTED — AB
Shigella/Enteroinvasive E coli (EIEC): NOT DETECTED
VIBRIO CHOLERAE: NOT DETECTED
Vibrio species: NOT DETECTED
YERSINIA ENTEROCOLITICA: NOT DETECTED

## 2017-10-09 MED ORDER — LACTINEX PO CHEW: 1.0000 | CHEWABLE_TABLET | Freq: Three times a day (TID) | ORAL | 0 refills | Status: AC

## 2017-10-09 MED ORDER — VANCOMYCIN HCL 125 MG PO CAPS: 125.0000 mg | ORAL_CAPSULE | Freq: Four times a day (QID) | ORAL | 0 refills | Status: AC

## 2017-10-09 NOTE — Telephone Encounter (Signed)
C diff resulted antigen and toxin positive. Dr. Irene Limbo made aware and sent in prescription for Vancomycin to patient's pharmacy. Spoke to patient and she verbalized understanding.

## 2017-10-10 ENCOUNTER — Ambulatory Visit: Payer: Medicare Other

## 2017-10-10 ENCOUNTER — Telehealth: Payer: Self-pay | Admitting: Radiation Oncology

## 2017-10-10 NOTE — Telephone Encounter (Signed)
Phoned patient to inquire about status. Patient reports she continues to have diarrhea but, "Dr. Irene Limbo gave her an antibiotic." Patient request to delay start of radiation therapy until Monday, July 15th. Instructed patient to arrive at 2:45 pm for treatment. Patient verbalized understanding. Informed L1 therapist of these findings via email.

## 2017-10-11 ENCOUNTER — Ambulatory Visit: Payer: Medicare Other

## 2017-10-11 ENCOUNTER — Other Ambulatory Visit: Payer: Self-pay | Admitting: Hematology

## 2017-10-14 ENCOUNTER — Ambulatory Visit
Admission: RE | Admit: 2017-10-14 | Discharge: 2017-10-14 | Disposition: A | Discharge status: Home / Self Care | Payer: Medicare Other | Source: Ambulatory Visit | Attending: Radiation Oncology | Admitting: Radiation Oncology

## 2017-10-14 ENCOUNTER — Telehealth: Payer: Self-pay | Admitting: Radiation Oncology

## 2017-10-14 DIAGNOSIS — C651 Malignant neoplasm of right renal pelvis: Secondary | ICD-10-CM | POA: Diagnosis not present

## 2017-10-14 DIAGNOSIS — Z510 Encounter for antineoplastic radiation therapy: Secondary | ICD-10-CM | POA: Diagnosis not present

## 2017-10-14 DIAGNOSIS — L304 Erythema intertrigo: Secondary | ICD-10-CM | POA: Diagnosis not present

## 2017-10-14 DIAGNOSIS — B372 Candidiasis of skin and nail: Secondary | ICD-10-CM | POA: Diagnosis not present

## 2017-10-14 NOTE — Telephone Encounter (Signed)
Phoned patient to inquire if she plans to present for 1445 treatment today. No answer. No option to leave a voicemail message.

## 2017-10-15 ENCOUNTER — Ambulatory Visit
Admission: RE | Admit: 2017-10-15 | Discharge: 2017-10-15 | Disposition: A | Discharge status: Home / Self Care | Payer: Medicare Other | Source: Ambulatory Visit | Attending: Radiation Oncology | Admitting: Radiation Oncology

## 2017-10-15 DIAGNOSIS — Z510 Encounter for antineoplastic radiation therapy: Secondary | ICD-10-CM | POA: Diagnosis not present

## 2017-10-15 DIAGNOSIS — C651 Malignant neoplasm of right renal pelvis: Secondary | ICD-10-CM | POA: Diagnosis not present

## 2017-10-16 ENCOUNTER — Ambulatory Visit
Admission: RE | Admit: 2017-10-16 | Discharge: 2017-10-16 | Disposition: A | Discharge status: Home / Self Care | Payer: Medicare Other | Source: Ambulatory Visit | Attending: Radiation Oncology | Admitting: Radiation Oncology

## 2017-10-16 DIAGNOSIS — Z510 Encounter for antineoplastic radiation therapy: Secondary | ICD-10-CM | POA: Diagnosis not present

## 2017-10-16 DIAGNOSIS — C651 Malignant neoplasm of right renal pelvis: Secondary | ICD-10-CM | POA: Diagnosis not present

## 2017-10-16 NOTE — Progress Notes (Signed)
Hematology oncology clinic follow-up.  Date of service:  10/17/17    Patient Care Team: [name], MD as PCP - General (Internal Medicine)   Urologist: Dr. 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  Treatment -Atezolizumab IV q3weeks  Delton See q12weeks for bone mets  HISTORY OF PRESENTING ILLNESS: Please see my previous notes for details of initial presentation.  INTERVAL HISTORY:   Tiffany [name] is here for her scheduled follow-up and cycle 46 treatment with Atezolizumab. The patient's last visit with [place] was on 09/26/17. She is accompanied today by her husband. The pt reports that she is doing well overall. Verbal consent has been given by the pt for a clinical observer, Mathis Fare, to be present.   The pt reports that her diarrhea has settled down while taking Vancomycin for the last 7 days to treat her C. Diff. The pt notes that she has had significant urgency with her diarrhea, and notes this is slightly better today. She has not yet started probiotics. She notes that today is better than the last 6 days. The pt notes that her appetite has decreased and she has lost some weight.   Lab results today (10/17/17) of CBC w/diff, CMP, and Reticulocytes is as follows: all values are WNL except for RBC at 3.57, HGB at 9.9, HCT at 31.6, MCHC at 31.3, RDW at 20.9, Monocytes abs at 1.2k, Sodium at 132, Creatinine at 1.06, Albumin at 2.9, GFR at 44. Magnesium 10/17/17 is WNL at 1.9  On review of systems, pt reports decreased appetite, weight loss, continuing diarrhea, mild ankle swelling, sense of bowel urgency and denies blood in the stools, and any other symptoms.      MEDICAL HISTORY:  Past Medical History:  Diagnosis Date  . Arthritis     knees 03-10-12 had Cortisone  injection  . Cancer [facility]) june 2016   metastatic  . DDD (degenerative disc disease), cervical   . Edema leg    feet and ankles  . Esophagus disorder    Had esophagus stretched  . GERD (gastroesophageal reflux disease)    Benign stricture dilated in 2011  . Gout    Patient notes she has possible gout but has not been on chronic medications for this  . Headache(784.0)   . Hypertension   . Occipital neuralgia   . Occipital neuralgia    Related to cervical degenerative disc disease  . Peptic ulcer disease    Previous history in the remote past  . Ulcerative proctitis ([place])   . Ulcerative proctitis ([place]) 2014    SURGICAL HISTORY: Past Surgical History:  Procedure Laterality Date  . ABDOMINAL HYSTERECTOMY    . APPENDECTOMY    . CHOLECYSTECTOMY    . COLONOSCOPY WITH PROPOFOL  03/25/2012   Procedure: COLONOSCOPY WITH PROPOFOL;  Surgeon: Garlan Fair, MD;  Location: [facility]OSCOPY;  Service: Endoscopy;  Laterality: N/A;  . DILATION AND CURETTAGE OF UTERUS    . ESOPHAGEAL DILATION     For benign stricture in 2011  . ESOPHAGOGASTRODUODENOSCOPY    . FLEXIBLE SIGMOIDOSCOPY N/A 04/26/2014   Procedure: FLEXIBLE SIGMOIDOSCOPY - UnSedated;  Surgeon: Garlan Fair, MD;  Location: [facility];  Service: Endoscopy;  Laterality: N/A;  . FLEXIBLE SIGMOIDOSCOPY N/A 01/24/2015   Procedure: FLEXIBLE SIGNMOIDOSCOPY W/ FLEET ENEMIA;  Surgeon: Ursula Alert  Wynetta Emery, MD;  Location: [facility];  Service: Endoscopy;  Laterality: N/A;  . FLEXIBLE SIGMOIDOSCOPY N/A 05/15/2016   Procedure: FLEXIBLE SIGMOIDOSCOPY;  Surgeon: Garlan Fair, MD;  Location: [facility];  Service: Endoscopy;  Laterality: N/A;  pt needs to ahve an enema upon arrival   . NECK SURGERY     20 years ago  . TOTAL ABDOMINAL HYSTERECTOMY W/ BILATERAL SALPINGOOPHORECTOMY     At age 71 due to miscarriage and retained products of conception causing significant uterine bleeding. Patient has been on estrogen replacement therapy since  then.    SOCIAL HISTORY: Social History   Socioeconomic History  . Marital status: Married    Spouse name: Not on file  . Number of children: Not on file  . Years of education: Not on file  . Highest education level: Not on file  Occupational History  . Not on file  Social Needs  . Financial resource strain: Not on file  . Food insecurity:    Worry: Not on file    Inability: Not on file  . Transportation needs:    Medical: Not on file    Non-medical: Not on file  Tobacco Use  . Smoking status: Former Smoker    Last attempt to quit: 11/01/1983    Years since quitting: 33.9  . Smokeless tobacco: Never Used  Substance and Sexual Activity  . Alcohol use: Yes    Comment: occ  . Drug use: No  . Sexual activity: Never  Lifestyle  . Physical activity:    Days per week: Not on file    Minutes per session: Not on file  . Stress: Not on file  Relationships  . Social connections:    Talks on phone: Not on file    Gets together: Not on file    Attends religious service: Not on file    Active member of club or organization: Not on file    Attends meetings of clubs or organizations: Not on file    Relationship status: Not on file  . Intimate partner violence:    Fear of current or ex partner: Not on file    Emotionally abused: Not on file    Physically abused: Not on file    Forced sexual activity: Not on file  Other Topics Concern  . Not on file  Social History Narrative  . Not on file  Retired as Conservation officer, nature for [employer]. She is very well spoken and intelligent individual and exhibits a keen knowledge of her medical conditions.  FAMILY HISTORY: Family History  Problem Relation Age of Onset  . Lung cancer Sister   . Prostate cancer Brother   . Uterine cancer Maternal Aunt   . Breast cancer Paternal Grandmother   . Hodgkin's lymphoma Sister     ALLERGIES:  is allergic to nitrofurantoin; indomethacin; and lisinopril.  MEDICATIONS:  Current  Outpatient Medications  Medication Sig Dispense Refill  . acetaminophen (TYLENOL) 650 MG CR tablet Take 1,300 mg by mouth 2 (two) times daily.    Marland Kitchen amLODipine (NORVASC) 5 MG tablet Take 5 mg by mouth daily.    . benzonatate (TESSALON) 100 MG capsule Take 1 capsule (100 mg total) by mouth 3 (three) times daily as needed for cough. (Patient not taking: Reported on 09/27/2017) 90 capsule 2  . diclofenac sodium (VOLTAREN) 1 % GEL APPLY 2 TO 4 GRAMS Q 6 H PRN FOR PAIN  0  . estradiol (ESTRACE) 0.5 MG tablet Take 0.5 mg  by mouth daily.  1  . feeding supplement, ENSURE ENLIVE, (ENSURE ENLIVE) LIQD Take 237 mLs by mouth 2 (two) times daily between meals. 237 mL 12  . furosemide (LASIX) 20 MG tablet Take 1 tablet (20 mg total) by mouth daily for 3 days. 3 tablet 0  . iron polysaccharides (NIFEREX) 150 MG capsule Take 1 capsule (150 mg total) by mouth daily. (Patient not taking: Reported on 09/27/2017) 30 capsule 1  . lactobacillus acidophilus & bulgar (LACTINEX) chewable tablet Chew 1 tablet by mouth 3 (three) times daily with meals for 20 days. 60 tablet 0  . mirtazapine (REMERON) 15 MG tablet TAKE 1 TABLET(15 MG) BY MOUTH AT BEDTIME 90 tablet 0  . ondansetron (ZOFRAN ODT) 4 MG disintegrating tablet Take 1 tablet (4 mg total) by mouth every 8 (eight) hours as needed for nausea or vomiting. (Patient not taking: Reported on 09/27/2017) 20 tablet 0  . PRESCRIPTION MEDICATION Chemo card    . Respiratory Therapy Supplies (FLUTTER) DEVI 1 each by Does not apply route daily. 1 each 0  . triamcinolone ointment (KENALOG) 0.5 % APPLY EXTERNALLY TO THE AFFECTED AREA TWICE DAILY 30 g 0  . vancomycin (VANCOCIN) 125 MG capsule Take 1 capsule (125 mg total) by mouth 4 (four) times daily for 14 days. 56 capsule 0  . vitamin B-12 (CYANOCOBALAMIN) 100 MCG tablet Take 100 mcg by mouth daily.     No current facility-administered medications for this visit.    Facility-Administered Medications Ordered in Other Visits    Medication Dose Route Frequency Provider Last Rate Last Dose  . sodium chloride 0.9 % injection 10 mL  10 mL Intravenous PRN [name], MD   10 mL at 02/28/17 1602   REVIEW OF SYSTEMS:   A 10+ POINT REVIEW OF SYSTEMS WAS OBTAINED including neurology, dermatology, psychiatry, cardiac, respiratory, lymph, extremities, GI, GU, Musculoskeletal, constitutional, breasts, reproductive, HEENT.  All pertinent positives are noted in the HPI.  All others are negative.   PHYSICAL EXAMINATION: ECOG PERFORMANCE STATUS: 2  Vitals:   10/17/17 1016  BP: 136/68  Pulse: 79  Resp: 18  Temp: 98 F (36.7 C)  SpO2: 98%   Filed Weights   10/17/17 1016  Weight: 115 lb 6.4 oz (52.3 kg)     GENERAL:alert, in no acute distress and comfortable SKIN: no acute rashes, no significant lesions EYES: conjunctiva are pink and non-injected, sclera anicteric OROPHARYNX: MMM, no exudates, no oropharyngeal erythema or ulceration NECK: supple, no JVD LYMPH:  no palpable lymphadenopathy in the cervical, axillary or inguinal regions LUNGS: clear to auscultation b/l with normal respiratory effort HEART: regular rate & rhythm ABDOMEN:  normoactive bowel sounds , non tender, not distended. No palpable hepatosplenomegaly.  Extremity: no pedal edema PSYCH: alert & oriented x 3 with fluent speech NEURO: no focal motor/sensory deficits   LABORATORY DATA:   CBC Latest Ref Rng & Units 10/17/2017 09/26/2017 09/05/2017  WBC 3.9 - 10.3 K/uL 7.2 8.1 7.2  Hemoglobin 11.6 - 15.9 g/dL 9.9(L) 11.1(L) 7.9(L)  Hematocrit 34.8 - 46.6 % 31.6(L) 33.8(L) 26.3(L)  Platelets 145 - 400 K/uL 231 310 361   . CBC    Component Value Date/Time   WBC 7.2 10/17/2017 0840   RBC 3.57 (L) 10/17/2017 0840   HGB 9.9 (L) 10/17/2017 0840   HGB 8.3 (L) 08/15/2017 1121   HGB 9.0 (L) 03/21/2017 1352   HCT 31.6 (L) 10/17/2017 0840   HCT 29.3 (L) 03/21/2017 1352   PLT 231 10/17/2017 0840  PLT 414 (H) 08/15/2017 1121   PLT 311  03/21/2017 1352   MCV 88.5 10/17/2017 0840   MCV 90.7 03/21/2017 1352   MCH 27.7 10/17/2017 0840   MCHC 31.3 (L) 10/17/2017 0840   RDW 20.9 (H) 10/17/2017 0840   RDW 16.3 (H) 03/21/2017 1352   LYMPHSABS 1.4 10/17/2017 0840   LYMPHSABS 1.6 03/21/2017 1352   MONOABS 1.2 (H) 10/17/2017 0840   MONOABS 0.6 03/21/2017 1352   EOSABS 0.2 10/17/2017 0840   EOSABS 0.1 03/21/2017 1352   BASOSABS 0.0 10/17/2017 0840   BASOSABS 0.0 03/21/2017 1352      CMP Latest Ref Rng & Units 10/17/2017 09/26/2017 09/05/2017  Glucose 70 - 99 mg/dL 98 125(H) 96  BUN 8 - 23 mg/dL 22 22 20   Creatinine 0.44 - 1.00 mg/dL 1.06(H) 0.97 0.96  Sodium 135 - 145 mmol/L 132(L) 136 138  Potassium 3.5 - 5.1 mmol/L 5.0 4.5 4.6  Chloride 98 - 111 mmol/L 99 101 105  CO2 22 - 32 mmol/L 26 27 24   Calcium 8.9 - 10.3 mg/dL 10.1 9.5 9.3  Total Protein 6.5 - 8.1 g/dL 7.3 7.2 7.3  Total Bilirubin 0.3 - 1.2 mg/dL 0.4 0.4 0.3  Alkaline Phos 38 - 126 U/L 49 48 50  AST 15 - 41 U/L 17 15 19   ALT 0 - 44 U/L 9 10 7    . Lab Results  Component Value Date   LDH 228 05/11/2016   Component     Latest Ref Rng & Units 10/08/2017  Campylobacter species     NOT DETECTED NOT DETECTED  Plesimonas shigelloides     NOT DETECTED NOT DETECTED  Salmonella species     NOT DETECTED DETECTED (A)  Yersinia enterocolitica     NOT DETECTED NOT DETECTED  Vibrio species     NOT DETECTED NOT DETECTED  Vibrio cholerae     NOT DETECTED NOT DETECTED  Enteroaggregative E coli (EAEC)     NOT DETECTED NOT DETECTED  Enteropathogenic E coli (EPEC)     NOT DETECTED NOT DETECTED  Enterotoxigenic E coli (ETEC)     NOT DETECTED NOT DETECTED  Shiga like toxin producing E coli (STEC)     NOT DETECTED NOT DETECTED  Shigella/Enteroinvasive E coli (EIEC)     NOT DETECTED NOT DETECTED  Cryptosporidium     NOT DETECTED NOT DETECTED  Cyclospora cayetanensis     NOT DETECTED NOT DETECTED  Entamoeba histolytica     NOT DETECTED NOT DETECTED  Giardia  lamblia     NOT DETECTED NOT DETECTED  Adenovirus F40/41     NOT DETECTED NOT DETECTED  Astrovirus     NOT DETECTED NOT DETECTED  Norovirus GI/GII     NOT DETECTED NOT DETECTED  Rotavirus A     NOT DETECTED NOT DETECTED  Sapovirus (I, II, IV, and V)     NOT DETECTED NOT DETECTED  C Diff antigen     NEGATIVE POSITIVE (A)  C Diff toxin     NEGATIVE POSITIVE (A)  C Diff interpretation      Toxin producing C. difficile detected.    Radiology .No results found.  ASSESSMENT & PLAN:   84 year old Caucasian female with  #1 Right Renal pelvis metastatic transitional cell carcinoma.  Initial PET scan revealed metastases to the lungs, liver, multiple nodal stations and bones. -PET scan done after 8 cycles of treatment for restaging disease showed significant response to treatment with no evidence of active disease. Patient has no clinical  evidence of disease progression at this time. CT chest 06/16/2015 showed no evidence of cancer progression. PET/CT scan done on 10/21/2015- showed no findings for metastatic disease. New right-sided hydronephrosis versus local tumor recurrence needs to be evaluated further. MRI of the kidney showed hyperenhancing the right renal pelvis lesion consistent with concern for local recurrence. -PET/CT 03/06/2016 with no findings of metastatic disease. Extensive right-sided hydronephrosis with high activity from excreted FDG obscuring any findings along the collecting system. -PET/CT  06/13/2016- Interval increase in size of right kidney. Findings are suspicious for progression of residual/recurrent urothelial carcinoma with further dilatation of the right renal collecting system. 2. No evidence for hypermetabolic metastases.  -PET/CT 10/22/2016 - Continued interval progression of the abnormal soft tissue associated with the right kidney. This soft tissue remains markedly hypermetabolic and is compatible with continued progression of recurrent right renal disease. 2.  No evidence for hypermetabolic metastases in the neck, chest, abdomen, or pelvis. PET/CT 04/04/2017-  Interval enlargement of and increase in hypermetabolism within the right renal mass, consistent with residual/recurrent disease. Increase in disease progression along the proximal right ureter. 2. No evidence of abdominopelvic nodal or extra abdominal metastatic disease.  09/03/17 PET which revealed 1. Considerable enlargement of the RIGHT renal hilar mass with persistent intense peripheral hypermetabolic activity consistent with neoplasm. 2. Extension of neoplasm into the proximal RIGHT ureter with mild inferior progression (2 cm)  #2 Bone metastases   Plan:  -patient started on palliative RT per radiation oncology Dr Tammi Klippel and tolerating this with mild grade 1 fatigue but no other acute issues. continue Xgeva to q12 weeks since she has completed 2 yrs of this treatment already. -transfuse prn for hgb <8 or if symptomatic. -Discussed pt labwork today, 10/17/17; HGB at 9.9, Sodium at 132, Albumin at 2.9 -Continue with palliative radiation -Will hold Atezolizumab today and will look to return to this in 3 weeks - given the current diarrhea with C.diff and salmonella infections  #3 h/o skin rash likely related to her Atezolizumab. Minimal grade 1 and controlled with topical triamcinolone. Unchanged today.  #4 chronic bronchiectasis with Cough and some clear secretions likely due to bronchiectasis. Followed by Dr Melvyn Novas. -No chest pain no increased shortness of breath or dyspnea on exertion  -Has an action plan with Dr. Melvyn Novas to use when necessary antibiotics for worsening symptoms. -Continue follow-up with Dr. 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 Wynetta Emery.  But stopped using it due to cost issues. Was then on PO budesonide  which she has stopped as well. last sigmoidscopy with Dr Wynetta Emery - showed stable ulcerative proctitis  -transfuse prn for hgb<8. Does not report overt rectal bleeding at this time.  #6 Recent Salmonella enteritis and C. Diff -- returned -Recommended the pt begin consuming probiotics which have been sent to her pharmacy  -Continue Vancomycin through current 14 day course for C. Diff treatment--might need longer course if she needs additional abx for salmonella. -GI panel 10/08/17 revealed the continued presence of Salmonella from her previously known salmonella presence 3 months ago -Will refer the pt back to Infectious Disease, Dr. 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 Vs active recurrent infection. -The pt will also follow up with Dr. Carlyle Basques -One liter IVF today -Will collect stool culture for Salmonella species identification   #7 history of hypertension -on Amlodipine    -  Urgent infectious disease referral to Carlyle Basques (established patient) - for recurrent salmonella infection -labs for additional stool studies (stool culture) -Hold Atezolizumab today (only IV fluids) -RTC in 3 weeks with labs/MD visit and next dose of Atezolizumab -continue Xgeva q12 weeks     All of the patients questions were answered with apparent satisfaction. The patient knows to call the clinic with any problems, questions or concerns.  The total time spent in the appt was 40 minutes and more than 50% was on counseling and direct patient cares.    Verbal consent has been given by the pt for a clinical observer, Mathis Fare, to be present.     Sullivan Lone MD [facility] Phys[facility]  (Office):       [phone] (Work cell):  [phone] (Fax):           [phone]  I, Baldwin [place], am acting as a scribe for Dr Irene Limbo.   .I have reviewed the above documentation for accuracy and completeness, and I agree with the above. [name] MD

## 2017-10-17 ENCOUNTER — Inpatient Hospital Stay: Payer: Medicare Other

## 2017-10-17 ENCOUNTER — Ambulatory Visit
Admission: RE | Admit: 2017-10-17 | Discharge: 2017-10-17 | Disposition: A | Discharge status: Home / Self Care | Payer: Medicare Other | Source: Ambulatory Visit | Attending: Radiation Oncology | Admitting: Radiation Oncology

## 2017-10-17 ENCOUNTER — Inpatient Hospital Stay: Payer: Medicare Other | Attending: Hematology | Admitting: Hematology

## 2017-10-17 VITALS — BP 117/54 | Temp 97.6°F | Resp 66

## 2017-10-17 VITALS — BP 136/68 | HR 79 | Temp 98.0°F | Resp 18 | Ht 60.0 in | Wt 115.4 lb

## 2017-10-17 DIAGNOSIS — C659 Malignant neoplasm of unspecified renal pelvis: Secondary | ICD-10-CM

## 2017-10-17 DIAGNOSIS — R21 Rash and other nonspecific skin eruption: Secondary | ICD-10-CM | POA: Diagnosis not present

## 2017-10-17 DIAGNOSIS — I10 Essential (primary) hypertension: Secondary | ICD-10-CM | POA: Diagnosis not present

## 2017-10-17 DIAGNOSIS — Z79899 Other long term (current) drug therapy: Secondary | ICD-10-CM | POA: Diagnosis not present

## 2017-10-17 DIAGNOSIS — C651 Malignant neoplasm of right renal pelvis: Secondary | ICD-10-CM

## 2017-10-17 DIAGNOSIS — C7800 Secondary malignant neoplasm of unspecified lung: Secondary | ICD-10-CM | POA: Diagnosis not present

## 2017-10-17 DIAGNOSIS — K625 Hemorrhage of anus and rectum: Secondary | ICD-10-CM | POA: Diagnosis not present

## 2017-10-17 DIAGNOSIS — C7951 Secondary malignant neoplasm of bone: Secondary | ICD-10-CM | POA: Diagnosis not present

## 2017-10-17 DIAGNOSIS — E860 Dehydration: Secondary | ICD-10-CM

## 2017-10-17 DIAGNOSIS — J479 Bronchiectasis, uncomplicated: Secondary | ICD-10-CM | POA: Diagnosis not present

## 2017-10-17 DIAGNOSIS — Z510 Encounter for antineoplastic radiation therapy: Secondary | ICD-10-CM | POA: Diagnosis not present

## 2017-10-17 DIAGNOSIS — C787 Secondary malignant neoplasm of liver and intrahepatic bile duct: Secondary | ICD-10-CM | POA: Insufficient documentation

## 2017-10-17 DIAGNOSIS — R197 Diarrhea, unspecified: Secondary | ICD-10-CM | POA: Diagnosis not present

## 2017-10-17 DIAGNOSIS — Z95828 Presence of other vascular implants and grafts: Secondary | ICD-10-CM

## 2017-10-17 DIAGNOSIS — A029 Salmonella infection, unspecified: Secondary | ICD-10-CM

## 2017-10-17 DIAGNOSIS — A0472 Enterocolitis due to Clostridium difficile, not specified as recurrent: Secondary | ICD-10-CM

## 2017-10-17 LAB — CBC WITH DIFFERENTIAL/PLATELET
BASOS ABS: 0 10*3/uL (ref 0.0–0.1)
Basophils Relative: 0 %
Eosinophils Absolute: 0.2 10*3/uL (ref 0.0–0.5)
Eosinophils Relative: 2 %
HEMATOCRIT: 31.6 % — AB (ref 34.8–46.6)
HEMOGLOBIN: 9.9 g/dL — AB (ref 11.6–15.9)
LYMPHS PCT: 19 %
Lymphs Abs: 1.4 10*3/uL (ref 0.9–3.3)
MCH: 27.7 pg (ref 25.1–34.0)
MCHC: 31.3 g/dL — ABNORMAL LOW (ref 31.5–36.0)
MCV: 88.5 fL (ref 79.5–101.0)
MONO ABS: 1.2 10*3/uL — AB (ref 0.1–0.9)
Monocytes Relative: 17 %
NEUTROS ABS: 4.5 10*3/uL (ref 1.5–6.5)
NEUTROS PCT: 62 %
Platelets: 231 10*3/uL (ref 145–400)
RBC: 3.57 MIL/uL — ABNORMAL LOW (ref 3.70–5.45)
RDW: 20.9 % — AB (ref 11.2–14.5)
WBC: 7.2 10*3/uL (ref 3.9–10.3)

## 2017-10-17 LAB — CMP (CANCER CENTER ONLY)
ALBUMIN: 2.9 g/dL — AB (ref 3.5–5.0)
ALT: 9 U/L (ref 0–44)
ANION GAP: 7 (ref 5–15)
AST: 17 U/L (ref 15–41)
Alkaline Phosphatase: 49 U/L (ref 38–126)
BUN: 22 mg/dL (ref 8–23)
CO2: 26 mmol/L (ref 22–32)
Calcium: 10.1 mg/dL (ref 8.9–10.3)
Chloride: 99 mmol/L (ref 98–111)
Creatinine: 1.06 mg/dL — ABNORMAL HIGH (ref 0.44–1.00)
GFR, Est AFR Am: 52 mL/min — ABNORMAL LOW (ref >60)
GFR, Estimated: 44 mL/min — ABNORMAL LOW (ref >60)
GLUCOSE: 98 mg/dL (ref 70–99)
Potassium: 5 mmol/L (ref 3.5–5.1)
SODIUM: 132 mmol/L — AB (ref 135–145)
TOTAL PROTEIN: 7.3 g/dL (ref 6.5–8.1)
Total Bilirubin: 0.4 mg/dL (ref 0.3–1.2)

## 2017-10-17 LAB — TSH: TSH: 0.899 u[IU]/mL (ref 0.308–3.960)

## 2017-10-17 LAB — PHOSPHORUS: Phosphorus: 4 mg/dL (ref 2.5–4.6)

## 2017-10-17 LAB — MAGNESIUM: Magnesium: 1.9 mg/dL (ref 1.7–2.4)

## 2017-10-17 MED ORDER — HEPARIN SOD (PORK) LOCK FLUSH 100 UNIT/ML IV SOLN
500.0000 [IU] | Freq: Once | INTRAVENOUS | Status: AC | PRN
  Administered 2017-10-17: 500 [IU] via INTRAVENOUS
  Filled 2017-10-17: qty 5

## 2017-10-17 MED ORDER — EPINEPHRINE HCL 0.1 MG/ML IJ SOLN: 0.2500 mg | Freq: Once | INTRAMUSCULAR | Status: DC | PRN

## 2017-10-17 MED ORDER — SODIUM CHLORIDE 0.9 % IJ SOLN
10.0000 mL | INTRAMUSCULAR | Status: DC | PRN
  Administered 2017-10-17: 10 mL via INTRAVENOUS
  Filled 2017-10-17: qty 10

## 2017-10-17 MED ORDER — SODIUM CHLORIDE 0.9 % IV SOLN
1000.0000 mL | Freq: Once | INTRAVENOUS | Status: AC
  Administered 2017-10-17: 1000 mL via INTRAVENOUS

## 2017-10-17 MED ORDER — DIPHENHYDRAMINE HCL 50 MG/ML IJ SOLN: 50.0000 mg | Freq: Once | INTRAMUSCULAR | Status: DC | PRN

## 2017-10-17 MED ORDER — SODIUM CHLORIDE 0.9 % IV SOLN: Freq: Once | INTRAVENOUS | Status: DC | PRN

## 2017-10-17 MED ORDER — METHYLPREDNISOLONE SODIUM SUCC 125 MG IJ SOLR: 125.0000 mg | Freq: Once | INTRAMUSCULAR | Status: DC | PRN

## 2017-10-17 MED ORDER — DIPHENHYDRAMINE HCL 50 MG/ML IJ SOLN: 25.0000 mg | Freq: Once | INTRAMUSCULAR | Status: DC | PRN

## 2017-10-17 MED ORDER — ALBUTEROL SULFATE (2.5 MG/3ML) 0.083% IN NEBU
2.5000 mg | INHALATION_SOLUTION | Freq: Once | RESPIRATORY_TRACT | Status: DC | PRN
  Filled 2017-10-17: qty 3

## 2017-10-17 NOTE — Patient Instructions (Signed)
Rehydration, Adult Rehydration is the replacement of body fluids and salts and minerals (electrolytes) that are lost during dehydration. Dehydration is when there is not enough fluid or water in the body. This happens when you lose more fluids than you take in. Common causes of dehydration include:  Vomiting.  Diarrhea.  Excessive sweating, such as from heat exposure or exercise.  Taking medicines that cause the body to lose excess fluid (diuretics).  Impaired kidney function.  Not drinking enough fluid.  Certain illnesses or infections.  Certain poorly controlled long-term (chronic) illnesses, such as diabetes, heart disease, and kidney disease.  Symptoms of mild dehydration may include thirst, dry lips and mouth, dry skin, and dizziness. Symptoms of severe dehydration may include increased heart rate, confusion, fainting, and not urinating. You can rehydrate by drinking certain fluids or getting fluids through an IV tube, as told by your health care provider. What are the risks? Generally, rehydration is safe. However, one problem that can happen is taking in too much fluid (overhydration). This is rare. If overhydration happens, it can cause an electrolyte imbalance, kidney failure, or a decrease in salt (sodium) levels in the body. How to rehydrate Follow instructions from your health care provider for rehydration. The kind of fluid you should drink and the amount you should drink depend on your condition.  If directed by your health care provider, drink an oral rehydration solution (ORS). This is a drink designed to treat dehydration that is found in pharmacies and retail stores. ? Make an ORS by following instructions on the package. ? Start by drinking small amounts, about  cup (120 mL) every 5-10 minutes. ? Slowly increase how much you drink until you have taken the amount recommended by your health care provider.  Drink enough clear fluids to keep your urine clear or pale  yellow. If you were instructed to drink an ORS, finish the ORS first, then start slowly drinking other clear fluids. Drink fluids such as: ? Water. Do not drink only water. Doing that can lead to having too little sodium in your body (hyponatremia). ? Ice chips. ? Fruit juice that you have added water to (diluted juice). ? Low-calorie sports drinks.  If you are severely dehydrated, your health care provider may recommend that you receive fluids through an IV tube in the hospital.  Do not take sodium tablets. Doing that can lead to the condition of having too much sodium in your body (hypernatremia).  Eating while you rehydrate Follow instructions from your health care provider about what to eat while you rehydrate. Your health care provider may recommend that you slowly begin eating regular foods in small amounts.  Eat foods that contain a healthy balance of electrolytes, such as bananas, oranges, potatoes, tomatoes, and spinach.  Avoid foods that are greasy or contain a lot of fat or sugar.  In some cases, you may get nutrition through a feeding tube that is passed through your nose and into your stomach (nasogastric tube, or NG tube). This may be done if you have uncontrolled vomiting or diarrhea. Beverages to avoid Certain beverages may make dehydration worse. While you rehydrate, avoid:  Alcohol.  Caffeine.  Drinks that contain a lot of sugar. These include: ? High-calorie sports drinks. ? Fruit juice that is not diluted. ? Soda.  Check nutrition labels to see how much sugar or caffeine a beverage contains. Signs of dehydration recovery You may be recovering from dehydration if:  You are urinating more often than before you   started rehydrating.  Your urine is clear or pale yellow.  Your energy level improves.  You vomit less frequently.  You have diarrhea less frequently.  Your appetite improves or returns to normal.  You feel less dizzy or less light-headed.  Your  skin tone and color start to look more normal.  Contact a health care provider if:  You continue to have symptoms of mild dehydration, such as: ? Thirst. ? Dry lips. ? Slightly dry mouth. ? Dry, warm skin. ? Dizziness.  You continue to vomit or have diarrhea. Get help right away if:  You have symptoms of dehydration that get worse.  You feel: ? Confused. ? Weak. ? Like you are going to faint.  You have not urinated in 6-8 hours.  You have very dark urine.  You have trouble breathing.  Your heart rate while sitting still is over 100 beats a minute.  You cannot drink fluids without vomiting.  You have vomiting or diarrhea that: ? Gets worse. ? Does not go away.  You have a fever. This information is not intended to replace advice given to you by your health care provider. Make sure you discuss any questions you have with your health care provider. Document Released: 06/11/2011 Document Revised: 10/07/2015 Document Reviewed: 05/13/2015 Elsevier Interactive Patient Education  2018 [employer].  Preventing MDRO Infections Multidrug-resistant organisms (MDRO) are bacteria that have become resistant to antibiotic medicines. This means that antibiotics cannot stop the bacteria from growing. Types of MDRO include:  Methicillin/oxacillin-resistant Staphylococcus aureus (MRSA).  Vancomycin-resistant enterococci (VRE).  Extended-spectrum beta-lactamases (ESBLs).  Clostridium difficile (C. Difficile).  Multi-drug resistant tuberculosis (MDR TB).  Penicillin-resistant Streptococcus pneumonia (PRSP).  Carbapenem-resistant enterobacteriaceae (CRE).  Everyone has good and bad bacteria in his or her body, such as in the stomach or on the skin. Good bacteria help protect the body from infection. However, when you take an antibiotic medicine, it may kill both the good and bad bacteria, which then allows medicine-resistant bacteria to grow. Infections caused by MDRO can be  difficult to treat. It is important to follow certain safety measures to prevent the spread of MDRO. What increases the risk? You are more likely to develop a MDRO infection if:  You were treated with an antibiotic medicine for a long time.  You have been in the hospital for a long time.  You recently had major surgery, such as chest or abdominal surgery.  You have a weakened disease-fighting (immune) system. This may be caused by an illness, long-term (chronic) condition, or medical treatment.  You have a catheter that has stayed in for a long time, such as a urinary catheter or vascular access device.  MDRO are usually spread through hands that have the germs (contaminated hands). MDRO may also be spread through:  Medical equipment that was not cleaned properly.  Shared personal items, such as razors or towels.  Contaminated surfaces, such as a bathroom counter or sink.  Undercooked or raw meat. Animals treated with antibiotics may have medicine-resistant bacteria.  Water or vegetables contaminated with animal feces.  How is this treated? MDRO infections are usually treated with antibiotic medicines that are taken by mouth (oral antibiotics). Treatment depends on the type of MDRO you have. MDRO infections are difficult to treat, and you may need to be hospitalized. Depending on how severe your infection is, you may need other treatments such as:  IV antibiotics.  High-dose antibiotics.  More than one antibiotic.  Antibiotics that you breathe in (inhaled  antibiotics), if you have pneumonia.  A machine to help you breathe (ventilator).  What actions can be taken? What hospitals are doing:  Encouraging staff, patients, and visitors to wash hands often with soap and warm water, and to use hand sanitizer when soap and water are not available.  Taking extra steps to prevent infection (contact precautions) with patients who are infected with MDRO. Contact precautions  include: ? Having all healthcare workers and visitors wash their hands before and after leaving the room. ? Having all healthcare workers and visitors wear a gown and gloves while in the room, and asking them throw away the gown and gloves before leaving the room.  Prescribing antibiotic medicines only when they are needed. Over-prescribing antibiotic medicines can help the spread of MDRO.  Keeping patients with MDRO in a room by themselves (isolation) or placing them in rooms with other patients who are already infected with MDRO.  Carefully cleaning and disinfecting hospital rooms and equipment.  Improving communication about which patients are infected with MDRO or who have been infected in the past.  Closely monitoring and tracking MDRO infections.  Educating staff and patients about the signs of infection. What you can do:  Wash your hands regularly with soap and warm water. If soap and water are not available, use hand sanitizer.  Take antibiotic medicines only when needed. Do not take antibiotic medicines for viral infections such as the common cold.  If you were prescribed an antibiotic medicine, take it only as told by your health care provider. Do not stop taking the antibiotic even if you start to feel better.  Do not share antibiotic medicines with others.  Do not share personal items, such as bath towels or razors.  If you have a catheter, care for it as told by your health care provider.  Keep all wounds clean and dry. Follow your health care provider's instructions about how to care for any wounds you have.  Practice safe food handling. This includes: ? Washing all fruits and vegetables. ? Washing all utensils that have come in contact with raw meat. ? Keeping a separate cutting board for raw meat. ? Cooking meat thoroughly. All poultry (including chicken and [place]) should be cooked to at least 165F (74C). Ground beef, pork, or lamb should be cooked to at least  160F (71C), and whole beef, pork, or lamb should be cooked to at least 145F (63C). ? Washing your hands with soap and warm water before and after cooking, especially after handling raw meat.  Clean and disinfect surfaces that are touched often. Use solutions or products that contain bleach. Do this on a regular basis. What visitors can do:  Although it is rare, visitors can be infected with MDRO. To prevent this, visitors should:  Wash their hands with soap and warm water before and after visiting you. If soap and water are not available, they can use hand sanitizer.  Ask your health care provider if they need to wear gloves and gowns when they visit you. If they do need to wear these, make sure they throw away the gloves and gowns before they leave your room.  Where to find more information: You can find more information about preventing MDRO infections from:  Centers for Disease Control and Prevention: eBuzzed.gl  Summary  MDRO are bacteria that have become resistant to antibiotic medicines.  You are more likely to be infected with a MDRO if you have been taking an antibiotic medicine for a long time  or have been hospitalized for a long time.  If you were prescribed an antibiotic medicine, take it exactly as told by your health care provider.  Wash your hands regularly with soap and warm water. If soap and water are not available, use hand sanitizer.  Ask your health care provider whether your visitors need to wear gloves and gowns when visiting you. This information is not intended to replace advice given to you by your health care provider. Make sure you discuss any questions you have with your health care provider. Document Released: 08/03/2016 Document Revised: 08/03/2016 Document Reviewed: 08/03/2016 Elsevier Interactive Patient Education  2018 [employer].

## 2017-10-18 ENCOUNTER — Ambulatory Visit
Admission: RE | Admit: 2017-10-18 | Discharge: 2017-10-18 | Disposition: A | Discharge status: Home / Self Care | Payer: Medicare Other | Source: Ambulatory Visit | Attending: Radiation Oncology | Admitting: Radiation Oncology

## 2017-10-18 ENCOUNTER — Telehealth: Payer: Self-pay

## 2017-10-18 ENCOUNTER — Ambulatory Visit: Payer: Medicare Other

## 2017-10-18 DIAGNOSIS — Z510 Encounter for antineoplastic radiation therapy: Secondary | ICD-10-CM | POA: Diagnosis not present

## 2017-10-18 DIAGNOSIS — C651 Malignant neoplasm of right renal pelvis: Secondary | ICD-10-CM | POA: Diagnosis not present

## 2017-10-18 MED ORDER — RADIAPLEXRX EX GEL
Freq: Once | CUTANEOUS | Status: AC
  Administered 2017-10-18: 13:00:00 via TOPICAL

## 2017-10-18 NOTE — Telephone Encounter (Signed)
Patient is already scheduled for upcoming appointment.per 7/18 los. Sent referral by dashboard to infectious disease

## 2017-10-18 NOTE — Progress Notes (Signed)
Pt here for patient teaching.  Pt given Radiation and You booklet, skin care instructions and Radiaplex gel.  Reviewed areas of pertinence such as diarrhea, fatigue, skin changes and urinary and bladder changes . Pt able to give teach back of to pat skin, use unscented/gentle soap, have Imodium on hand and drink plenty of water,apply Radiaplex bid and avoid applying anything to skin within 4 hours of treatment. Pt demonstrated understanding, needs reinforcement, no evidence of learning, refused teaching and  of information given and will contact nursing with any questions or concerns.     Http://rtanswers.org/treatmentinformation/whattoexpect/index

## 2017-10-21 ENCOUNTER — Ambulatory Visit: Payer: Medicare Other

## 2017-10-21 ENCOUNTER — Ambulatory Visit
Admission: RE | Admit: 2017-10-21 | Discharge: 2017-10-21 | Disposition: A | Discharge status: Home / Self Care | Payer: Medicare Other | Source: Ambulatory Visit | Attending: Radiation Oncology | Admitting: Radiation Oncology

## 2017-10-21 DIAGNOSIS — C651 Malignant neoplasm of right renal pelvis: Secondary | ICD-10-CM | POA: Diagnosis not present

## 2017-10-21 DIAGNOSIS — Z510 Encounter for antineoplastic radiation therapy: Secondary | ICD-10-CM | POA: Diagnosis not present

## 2017-10-22 ENCOUNTER — Ambulatory Visit
Admission: RE | Admit: 2017-10-22 | Discharge: 2017-10-22 | Disposition: A | Discharge status: Home / Self Care | Payer: Medicare Other | Source: Ambulatory Visit | Attending: Radiation Oncology | Admitting: Radiation Oncology

## 2017-10-22 DIAGNOSIS — C651 Malignant neoplasm of right renal pelvis: Secondary | ICD-10-CM | POA: Diagnosis not present

## 2017-10-22 DIAGNOSIS — Z510 Encounter for antineoplastic radiation therapy: Secondary | ICD-10-CM | POA: Diagnosis not present

## 2017-10-23 ENCOUNTER — Ambulatory Visit
Admission: RE | Admit: 2017-10-23 | Discharge: 2017-10-23 | Disposition: A | Discharge status: Home / Self Care | Payer: Medicare Other | Source: Ambulatory Visit | Attending: Radiation Oncology | Admitting: Radiation Oncology

## 2017-10-23 ENCOUNTER — Ambulatory Visit: Payer: Medicare Other

## 2017-10-23 DIAGNOSIS — C651 Malignant neoplasm of right renal pelvis: Secondary | ICD-10-CM | POA: Diagnosis not present

## 2017-10-23 DIAGNOSIS — Z510 Encounter for antineoplastic radiation therapy: Secondary | ICD-10-CM | POA: Diagnosis not present

## 2017-10-23 NOTE — Progress Notes (Signed)
This RN called to treatment area dressing room by therapist to assess pt. Upon arrival, pt stated that she had had an Ensure immediately prior to treatment and felt that the Ensure in combination with laying flat caused her sudden onset nausea. Encouraged pt to step around to nursing and be evaluated with VS and MD. Pt declined, stating "I'm just going to try to make it home". Pt was assisted to treatment waiting room without incident. Loma Sousa, RN BSN

## 2017-10-24 ENCOUNTER — Ambulatory Visit
Admission: RE | Admit: 2017-10-24 | Discharge: 2017-10-24 | Disposition: A | Discharge status: Home / Self Care | Payer: Medicare Other | Source: Ambulatory Visit | Attending: Radiation Oncology | Admitting: Radiation Oncology

## 2017-10-24 DIAGNOSIS — C651 Malignant neoplasm of right renal pelvis: Secondary | ICD-10-CM | POA: Diagnosis not present

## 2017-10-24 DIAGNOSIS — Z510 Encounter for antineoplastic radiation therapy: Secondary | ICD-10-CM | POA: Diagnosis not present

## 2017-10-25 ENCOUNTER — Ambulatory Visit
Admission: RE | Admit: 2017-10-25 | Discharge: 2017-10-25 | Disposition: A | Discharge status: Home / Self Care | Payer: Medicare Other | Source: Ambulatory Visit | Attending: Radiation Oncology | Admitting: Radiation Oncology

## 2017-10-25 ENCOUNTER — Encounter: Payer: Self-pay | Admitting: Radiation Oncology

## 2017-10-25 DIAGNOSIS — C651 Malignant neoplasm of right renal pelvis: Secondary | ICD-10-CM | POA: Diagnosis not present

## 2017-10-25 DIAGNOSIS — Z510 Encounter for antineoplastic radiation therapy: Secondary | ICD-10-CM | POA: Diagnosis not present

## 2017-10-25 NOTE — Progress Notes (Signed)
  Radiation Oncology         ([phone]) [phone] ________________________________  Name: Tiffany [name] MRN: [id number]  Date: 10/25/2017  DOB: 12/15/1924  End of Treatment Note  Diagnosis:   84 y.o. 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      Indication for treatment:  Palliative        Radiation treatment dates:   10/14/17 - 10/25/17  Site/dose:   30 Gy directed to the right kidney delivered in 10 fractions.  Beams/energy:   10X/6X //Photon with 3D technique   Narrative: The patient tolerated radiation treatment relatively well. Patient reported having occasional dulol pain on the right side when having a bowel movement. Patient reported having moderate fatigue and taking naps to help. Patient states no skin changes. She denies having frequency or urgency, but does report loose stools. Reports nocturia x1, and nausea.   Plan: The patient has completed radiation treatment. The patient will return to radiation oncology clinic for routine followup in one month. I advised her to call or return sooner if she has any questions or concerns related to her recovery or treatment. ________________________________  Sheral Apley. Tammi Klippel, M.D.   This document serves as a record of services personally performed by Tyler Pita MD. It was created on his behalf by Delton Coombes, a trained medical scribe. The creation of this record is based on the scribe's personal observations and the provider's statements to them.

## 2017-10-28 ENCOUNTER — Encounter: Payer: Self-pay | Admitting: Internal Medicine

## 2017-10-28 ENCOUNTER — Ambulatory Visit (INDEPENDENT_AMBULATORY_CARE_PROVIDER_SITE_OTHER): Payer: Medicare Other | Admitting: Internal Medicine

## 2017-10-28 VITALS — BP 99/63 | HR 85 | Temp 98.5°F | Wt 112.0 lb

## 2017-10-28 DIAGNOSIS — A0472 Enterocolitis due to Clostridium difficile, not specified as recurrent: Secondary | ICD-10-CM | POA: Diagnosis not present

## 2017-10-28 MED ORDER — FIDAXOMICIN 200 MG PO TABS: 200.0000 mg | ORAL_TABLET | Freq: Two times a day (BID) | ORAL | 3 refills | Status: DC

## 2017-10-28 MED ORDER — VANCOMYCIN HCL 125 MG PO CAPS: 125.0000 mg | ORAL_CAPSULE | Freq: Four times a day (QID) | ORAL | 0 refills | Status: DC

## 2017-10-28 NOTE — Progress Notes (Signed)
RFV: cdifficile  Patient ID: Tiffany [name], female   DOB: 12-20-1924, 84 y.o.   MRN: [id number]  HPI Tiffany [name] is a 84yo F who has metastatic transitional cell ca, was admitted in April 2019 for salmonella enteritis with bacteremia. She was treated with IV therapy for 7 days then did well for a few weeks or so after her hospitalization without any signs of diarrhea. She noticed to start having diarrhea in July 2019. Dr Irene Limbo, her oncologist, worked up her diarrhea and found to have cdifficile (interestingly also redid her gi pathogen panel where salmonella sp also positive). She was started on 2 wk of oral vancomycin and then sent to [place] for further evaluation  She has had 3-4 watery stools in the evening mostly. Has occ abdominal cramping. Today had BM in the afternoon which she reports was soft  Out of pocket cost for oral vanco was $200  Mostly complains of lost of appetite. She has has #10 weight loss  Outpatient Encounter Medications as of 10/28/2017  Medication Sig  . acetaminophen (TYLENOL) 650 MG CR tablet Take 1,300 mg by mouth 2 (two) times daily.  Marland Kitchen amLODipine (NORVASC) 5 MG tablet Take 5 mg by mouth daily.  . benzonatate (TESSALON) 100 MG capsule Take 1 capsule (100 mg total) by mouth 3 (three) times daily as needed for cough. (Patient not taking: Reported on 09/27/2017)  . diclofenac sodium (VOLTAREN) 1 % GEL APPLY 2 TO 4 GRAMS Q 6 H PRN FOR PAIN  . estradiol (ESTRACE) 0.5 MG tablet Take 0.5 mg by mouth daily.  . feeding supplement, ENSURE ENLIVE, (ENSURE ENLIVE) LIQD Take 237 mLs by mouth 2 (two) times daily between meals.  . furosemide (LASIX) 20 MG tablet Take 1 tablet (20 mg total) by mouth daily for 3 days.  . iron polysaccharides (NIFEREX) 150 MG capsule Take 1 capsule (150 mg total) by mouth daily. (Patient not taking: Reported on 09/27/2017)  . lactobacillus acidophilus & bulgar (LACTINEX) chewable tablet Chew 1 tablet by mouth 3 (three) times daily with meals for 20 days.  .  mirtazapine (REMERON) 15 MG tablet TAKE 1 TABLET(15 MG) BY MOUTH AT BEDTIME  . ondansetron (ZOFRAN ODT) 4 MG disintegrating tablet Take 1 tablet (4 mg total) by mouth every 8 (eight) hours as needed for nausea or vomiting. (Patient not taking: Reported on 09/27/2017)  . PRESCRIPTION MEDICATION Chemo card  . Respiratory Therapy Supplies (FLUTTER) DEVI 1 each by Does not apply route daily.  Marland Kitchen triamcinolone ointment (KENALOG) 0.5 % APPLY EXTERNALLY TO THE AFFECTED AREA TWICE DAILY  . vitamin B-12 (CYANOCOBALAMIN) 100 MCG tablet Take 100 mcg by mouth daily.   Facility-Administered Encounter Medications as of 10/28/2017  Medication  . sodium chloride 0.9 % injection 10 mL     Patient Active Problem List   Diagnosis Date Noted  . Diarrhea in adult patient 07/17/2017  . Port catheter in place 07/21/2015  . Bronchiolitis 07/01/2015  . Bronchiectasis ([place]) with probable Assoc MAI  06/10/2015  . Liver metastases ([place]) 05/22/2015  . Bone metastases ([place]) 05/22/2015  . Bronchopneumonia 04/29/2015  . Insomnia 04/08/2015  . 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 TETANUS/TDAP  05/06/1945  . DEXA SCAN  05/07/1991    Social History   Tobacco Use  . Smoking status: Former Smoker    Last attempt to quit: 11/01/1983    Years since quitting: 34.0  . Smokeless tobacco:  Never Used  Substance Use Topics  . Alcohol use: Yes    Comment: occ  . Drug use: No    Review of Systems + diarrhea, loss of appetite, loss of weight. Otherwise 12 point ros is negative Physical Exam  BP 99/63   Pulse 85   Temp 98.5 F (36.9 C) (Oral)   Wt 112 lb (50.8 kg)   BMI 21.87 kg/m  Physical Exam  Constitutional:  oriented to person, place, and time. appears well-developed and well-nourished. No distress.  HENT: [place]/AT, PERRLA, no scleral icterus Mouth/Throat: Oropharynx is clear and moist. No oropharyngeal exudate.  Cardiovascular: Normal rate,  regular rhythm and normal heart sounds. Exam reveals no gallop and no friction rub.  No murmur heard.  Pulmonary/Chest: Effort normal and breath sounds normal. No respiratory distress.  has no wheezes.  Neck = supple, no nuchal rigidity Abdominal: Soft. Bowel sounds are normal.  exhibits no distension. There is no tenderness.  Lymphadenopathy: no cervical adenopathy. No axillary adenopathy Neurological: alert and oriented to person, place, and time.  Skin: Skin is warm and dry. No rash noted. No erythema.  Psychiatric: a normal mood and affect.  behavior is normal.   No results found for: CD4TCELL No results found for: CD4TABS No results found for: HIV1RNAQUANT No results found for: HEPBSAB No results found for: RPR, LABRPR  CBC Lab Results  Component Value Date   WBC 7.2 10/17/2017   RBC 3.57 (L) 10/17/2017   HGB 9.9 (L) 10/17/2017   HCT 31.6 (L) 10/17/2017   PLT 231 10/17/2017   MCV 88.5 10/17/2017   MCH 27.7 10/17/2017   MCHC 31.3 (L) 10/17/2017   RDW 20.9 (H) 10/17/2017   LYMPHSABS 1.4 10/17/2017   MONOABS 1.2 (H) 10/17/2017   EOSABS 0.2 10/17/2017    BMET Lab Results  Component Value Date   NA 132 (L) 10/17/2017   K 5.0 10/17/2017   CL 99 10/17/2017   CO2 26 10/17/2017   GLUCOSE 98 10/17/2017   BUN 22 10/17/2017   CREATININE 1.06 (H) 10/17/2017   CALCIUM 10.1 10/17/2017   GFRNONAA 44 (L) 10/17/2017   GFRAA 52 (L) 10/17/2017      Assessment and Plan  Protracted c.difficile colitis  - will recommend bland diet  - would like to change her to fidaxomicin which has better rates of preventing cdiff recurrence than oral vanco ( we have applied for [employer] assistance which they should mail her fidaxomicin (dificid) to her home in 24-48hrs. It will have 3 refills on this applicatoin in case we need to use further  -in the meantime, we will call in 2 days of oral vancomycin for her to bridge until she gets the fidaxomicin  Hx of salmonella enteritis and bacteremia  = I think the 2nd isolation on GI pathogen panel represents colonization. Unlikely to have 2nd bout. I suspect her diarrhea is all due to cdifficile  - we will see her back in 2 weeks

## 2017-10-29 ENCOUNTER — Other Ambulatory Visit: Payer: Self-pay | Admitting: *Deleted

## 2017-10-29 ENCOUNTER — Other Ambulatory Visit: Payer: Medicare Other

## 2017-10-29 DIAGNOSIS — A0472 Enterocolitis due to Clostridium difficile, not specified as recurrent: Secondary | ICD-10-CM | POA: Diagnosis not present

## 2017-10-30 LAB — CLOSTRIDIUM DIFFICILE TOXIN B, QUALITATIVE, REAL-TIME PCR: Toxigenic C. Difficile by PCR: NOT DETECTED

## 2017-10-30 LAB — TIQ-NTM

## 2017-11-06 NOTE — Progress Notes (Signed)
Hematology oncology clinic follow-up.  Date of service:  11/07/17    Patient Care Team: [name], MD as PCP - General (Internal Medicine)   Urologist: Dr. 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  Treatment -Atezolizumab IV q3weeks  Delton See q12weeks for bone mets -palliative radiation to rt renal pelvis mass  HISTORY OF PRESENTING ILLNESS: Please see my previous notes for details of initial presentation.  INTERVAL HISTORY:   Tiffany [name] is here for her scheduled follow-up and cycle 46 treatment with Atezolizumab. The patient's last visit with [place] was on 10/17/17. She is accompanied today by her husband. The pt reports that she is doing well overall.   She has seen Dr. Carlyle Basques in ID in the interim on 10/28/17. She was bridged with Vancomycin, and then placed on a 10 day trial of Fidaxomicin and has three days remaining without experiencing relief yet. The pt finished radiation. The pt reports that she is having some blood in her stools. She is waking up 2-3 times a night for her diarrhea, which is not causing abdominal pains but is causing rectal pain. She has lost 6 pounds in the last 3-4 weeks, has a weak appetite, and is not able to eat much. She notes that when she puts food in her mouth, she isn't able to make herself swallow it, though she denies difficulty swallowing water. She describes her overall condition as depressing.   Lab results today (11/07/17) of CBC w/diff, CMP is as follows: all values are WNL except for RBC at 2.95, HGB at 8.6, HCT at 27.0, RDW at 22.1, Sodium at 133, Glucose at 125, Calcium at 8.4, Total Protein at 6.0, Albumin at 2.3, GFR at 57. Magnesium 11/07/17 is WNL at 1.9  On review of systems, pt reports persisting diarrhea, some blood in  the stools, weak appetite, some fatigue, weight loss, and denies abdominal pain, SOB, and any other symptoms.      MEDICAL HISTORY:  Past Medical History:  Diagnosis Date  . Arthritis     knees 03-10-12 had Cortisone injection  . Cancer [facility]) june 2016   metastatic  . DDD (degenerative disc disease), cervical   . Edema leg    feet and ankles  . Esophagus disorder    Had esophagus stretched  . GERD (gastroesophageal reflux disease)    Benign stricture dilated in 2011  . Gout    Patient notes she has possible gout but has not been on chronic medications for this  . Headache(784.0)   . Hypertension   . Occipital neuralgia   . Occipital neuralgia    Related to cervical degenerative disc disease  . Peptic ulcer disease    Previous history in the remote past  . Ulcerative proctitis ([place])   . Ulcerative proctitis ([place]) 2014    SURGICAL HISTORY: Past Surgical History:  Procedure Laterality Date  . ABDOMINAL HYSTERECTOMY    . APPENDECTOMY    . CHOLECYSTECTOMY    . COLONOSCOPY WITH PROPOFOL  03/25/2012   Procedure: COLONOSCOPY WITH PROPOFOL;  Surgeon: Garlan Fair, MD;  Location: [facility];  Service: Endoscopy;  Laterality: N/A;  . DILATION AND CURETTAGE OF UTERUS    . ESOPHAGEAL DILATION     For benign stricture in 2011  . ESOPHAGOGASTRODUODENOSCOPY    . FLEXIBLE SIGMOIDOSCOPY N/A  04/26/2014   Procedure: FLEXIBLE SIGMOIDOSCOPY - UnSedated;  Surgeon: Garlan Fair, MD;  Location: [facility];  Service: Endoscopy;  Laterality: N/A;  . FLEXIBLE SIGMOIDOSCOPY N/A 01/24/2015   Procedure: FLEXIBLE SIGNMOIDOSCOPY W/ FLEET ENEMIA;  Surgeon: Garlan Fair, MD;  Location: [facility];  Service: Endoscopy;  Laterality: N/A;  . FLEXIBLE SIGMOIDOSCOPY N/A 05/15/2016   Procedure: FLEXIBLE SIGMOIDOSCOPY;  Surgeon: Garlan Fair, MD;  Location: [facility];  Service: Endoscopy;  Laterality: N/A;  pt needs to ahve an enema upon arrival   . NECK SURGERY     20 years ago  .  TOTAL ABDOMINAL HYSTERECTOMY W/ BILATERAL SALPINGOOPHORECTOMY     At age 89 due to miscarriage and retained products of conception causing significant uterine bleeding. Patient has been on estrogen replacement therapy since then.    SOCIAL HISTORY: Social History   Socioeconomic History  . Marital status: Married    Spouse name: Not on file  . Number of children: Not on file  . Years of education: Not on file  . Highest education level: Not on file  Occupational History  . Not on file  Social Needs  . Financial resource strain: Not on file  . Food insecurity:    Worry: Not on file    Inability: Not on file  . Transportation needs:    Medical: Not on file    Non-medical: Not on file  Tobacco Use  . Smoking status: Former Smoker    Last attempt to quit: 11/01/1983    Years since quitting: 34.0  . Smokeless tobacco: Never Used  Substance and Sexual Activity  . Alcohol use: Yes    Comment: occ  . Drug use: No  . Sexual activity: Never  Lifestyle  . Physical activity:    Days per week: Not on file    Minutes per session: Not on file  . Stress: Not on file  Relationships  . Social connections:    Talks on phone: Not on file    Gets together: Not on file    Attends religious service: Not on file    Active member of club or organization: Not on file    Attends meetings of clubs or organizations: Not on file    Relationship status: Not on file  . Intimate partner violence:    Fear of current or ex partner: Not on file    Emotionally abused: Not on file    Physically abused: Not on file    Forced sexual activity: Not on file  Other Topics Concern  . Not on file  Social History Narrative  . Not on file  Retired as Conservation officer, nature for [employer]. She is very well spoken and intelligent individual and exhibits a keen knowledge of her medical conditions.  FAMILY HISTORY: Family History  Problem Relation Age of Onset  . Lung cancer Sister   . Prostate  cancer Brother   . Uterine cancer Maternal Aunt   . Breast cancer Paternal Grandmother   . Hodgkin's lymphoma Sister     ALLERGIES:  is allergic to nitrofurantoin; indomethacin; and lisinopril.  MEDICATIONS:  Current Outpatient Medications  Medication Sig Dispense Refill  . acetaminophen (TYLENOL) 650 MG CR tablet Take 1,300 mg by mouth 2 (two) times daily.    Marland Kitchen amLODipine (NORVASC) 5 MG tablet Take 5 mg by mouth daily.    . benzonatate (TESSALON) 100 MG capsule Take 1 capsule (100 mg total) by mouth 3 (three) times daily as needed  for cough. (Patient not taking: Reported on 09/27/2017) 90 capsule 2  . cholestyramine (QUESTRAN) 4 g packet Take 1 packet (4 g total) by mouth 2 (two) times daily. 60 each 1  . diclofenac sodium (VOLTAREN) 1 % GEL APPLY 2 TO 4 GRAMS Q 6 H PRN FOR PAIN  0  . estradiol (ESTRACE) 0.5 MG tablet Take 0.5 mg by mouth daily.  1  . feeding supplement, ENSURE ENLIVE, (ENSURE ENLIVE) LIQD Take 237 mLs by mouth 2 (two) times daily between meals. 237 mL 12  . fidaxomicin (DIFICID) 200 MG TABS tablet Take 1 tablet (200 mg total) by mouth 2 (two) times daily. 20 tablet 3  . furosemide (LASIX) 20 MG tablet Take 1 tablet (20 mg total) by mouth daily for 3 days. 3 tablet 0  . iron polysaccharides (NIFEREX) 150 MG capsule Take 1 capsule (150 mg total) by mouth daily. (Patient not taking: Reported on 09/27/2017) 30 capsule 1  . mirtazapine (REMERON) 15 MG tablet TAKE 1 TABLET(15 MG) BY MOUTH AT BEDTIME 90 tablet 0  . ondansetron (ZOFRAN ODT) 4 MG disintegrating tablet Take 1 tablet (4 mg total) by mouth every 8 (eight) hours as needed for nausea or vomiting. (Patient not taking: Reported on 09/27/2017) 20 tablet 0  . PRESCRIPTION MEDICATION Chemo card    . Respiratory Therapy Supplies (FLUTTER) DEVI 1 each by Does not apply route daily. 1 each 0  . triamcinolone ointment (KENALOG) 0.5 % APPLY EXTERNALLY TO THE AFFECTED AREA TWICE DAILY 30 g 0  . vancomycin (VANCOCIN) 125 MG capsule  Take 1 capsule (125 mg total) by mouth 4 (four) times daily. 10 capsule 0  . vitamin B-12 (CYANOCOBALAMIN) 100 MCG tablet Take 100 mcg by mouth daily.     Current Facility-Administered Medications  Medication Dose Route Frequency Provider Last Rate Last Dose  . 0.9 %  sodium chloride infusion   Intravenous Continuous [name], MD       Facility-Administered Medications Ordered in Other Visits  Medication Dose Route Frequency Provider Last Rate Last Dose  . sodium chloride 0.9 % injection 10 mL  10 mL Intravenous PRN [name], MD   10 mL at 02/28/17 1602   REVIEW OF SYSTEMS:    A 10+ POINT REVIEW OF SYSTEMS WAS OBTAINED including neurology, dermatology, psychiatry, cardiac, respiratory, lymph, extremities, GI, GU, Musculoskeletal, constitutional, breasts, reproductive, HEENT.  All pertinent positives are noted in the HPI.  All others are negative.   PHYSICAL EXAMINATION: ECOG PERFORMANCE STATUS: 2  Vitals:   11/07/17 0944  BP: 123/61  Pulse: 81  Resp: 18  Temp: 97.9 F (36.6 C)  SpO2: 97%   Filed Weights   11/07/17 0944  Weight: 109 lb 4.8 oz (49.6 kg)     GENERAL:alert, in no acute distress and comfortable SKIN: no acute rashes, no significant lesions EYES: conjunctiva are pink and non-injected, sclera anicteric OROPHARYNX: MMM, no exudates, no oropharyngeal erythema or ulceration NECK: supple, no JVD LYMPH:  no palpable lymphadenopathy in the cervical, axillary or inguinal regions LUNGS: clear to auscultation b/l with normal respiratory effort HEART: regular rate & rhythm ABDOMEN:  normoactive bowel sounds , non tender, not distended. No palpable hepatosplenomegaly.  Extremity: no pedal edema PSYCH: alert & oriented x 3 with fluent speech NEURO: no focal motor/sensory deficits   LABORATORY DATA:   CBC Latest Ref Rng & Units 11/07/2017 10/17/2017 09/26/2017  WBC 3.9 - 10.3 K/uL 4.8 7.2 8.1  Hemoglobin 11.6 - 15.9 g/dL 8.6(L) 9.9(L)  11.1(L)    Hematocrit 34.8 - 46.6 % 27.0(L) 31.6(L) 33.8(L)  Platelets 145 - 400 K/uL 175 231 310   . CBC    Component Value Date/Time   WBC 4.8 11/07/2017 0818   RBC 2.95 (L) 11/07/2017 0818   HGB 8.6 (L) 11/07/2017 0818   HGB 8.3 (L) 08/15/2017 1121   HGB 9.0 (L) 03/21/2017 1352   HCT 27.0 (L) 11/07/2017 0818   HCT 29.3 (L) 03/21/2017 1352   PLT 175 11/07/2017 0818   PLT 414 (H) 08/15/2017 1121   PLT 311 03/21/2017 1352   MCV 91.5 11/07/2017 0818   MCV 90.7 03/21/2017 1352   MCH 29.2 11/07/2017 0818   MCHC 31.9 11/07/2017 0818   RDW 22.1 (H) 11/07/2017 0818   RDW 16.3 (H) 03/21/2017 1352   LYMPHSABS 0.9 11/07/2017 0818   LYMPHSABS 1.6 03/21/2017 1352   MONOABS 0.7 11/07/2017 0818   MONOABS 0.6 03/21/2017 1352   EOSABS 0.0 11/07/2017 0818   EOSABS 0.1 03/21/2017 1352   BASOSABS 0.0 11/07/2017 0818   BASOSABS 0.0 03/21/2017 1352      CMP Latest Ref Rng & Units 11/07/2017 10/17/2017 09/26/2017  Glucose 70 - 99 mg/dL 125(H) 98 125(H)  BUN 8 - 23 mg/dL 13 22 22   Creatinine 0.44 - 1.00 mg/dL 0.87 1.06(H) 0.97  Sodium 135 - 145 mmol/L 133(L) 132(L) 136  Potassium 3.5 - 5.1 mmol/L 4.7 5.0 4.5  Chloride 98 - 111 mmol/L 101 99 101  CO2 22 - 32 mmol/L 23 26 27   Calcium 8.9 - 10.3 mg/dL 8.4(L) 10.1 9.5  Total Protein 6.5 - 8.1 g/dL 6.0(L) 7.3 7.2  Total Bilirubin 0.3 - 1.2 mg/dL 0.5 0.4 0.4  Alkaline Phos 38 - 126 U/L 42 49 48  AST 15 - 41 U/L 15 17 15   ALT 0 - 44 U/L 8 9 10    . Lab Results  Component Value Date   LDH 228 05/11/2016   Component     Latest Ref Rng & Units 10/08/2017  Campylobacter species     NOT DETECTED NOT DETECTED  Plesimonas shigelloides     NOT DETECTED NOT DETECTED  Salmonella species     NOT DETECTED DETECTED (A)  Yersinia enterocolitica     NOT DETECTED NOT DETECTED  Vibrio species     NOT DETECTED NOT DETECTED  Vibrio cholerae     NOT DETECTED NOT DETECTED  Enteroaggregative E coli (EAEC)     NOT DETECTED NOT DETECTED  Enteropathogenic E coli  (EPEC)     NOT DETECTED NOT DETECTED  Enterotoxigenic E coli (ETEC)     NOT DETECTED NOT DETECTED  Shiga like toxin producing E coli (STEC)     NOT DETECTED NOT DETECTED  Shigella/Enteroinvasive E coli (EIEC)     NOT DETECTED NOT DETECTED  Cryptosporidium     NOT DETECTED NOT DETECTED  Cyclospora cayetanensis     NOT DETECTED NOT DETECTED  Entamoeba histolytica     NOT DETECTED NOT DETECTED  Giardia lamblia     NOT DETECTED NOT DETECTED  Adenovirus F40/41     NOT DETECTED NOT DETECTED  Astrovirus     NOT DETECTED NOT DETECTED  Norovirus GI/GII     NOT DETECTED NOT DETECTED  Rotavirus A     NOT DETECTED NOT DETECTED  Sapovirus (I, II, IV, and V)     NOT DETECTED NOT DETECTED  C Diff antigen     NEGATIVE POSITIVE (A)  C Diff toxin  NEGATIVE POSITIVE (A)  C Diff interpretation      Toxin producing C. difficile detected.    Radiology .No results found.  ASSESSMENT & PLAN:   84 year old Caucasian female with  #1 Right Renal pelvis metastatic transitional cell carcinoma.  Initial PET scan revealed metastases to the lungs, liver, multiple nodal stations and bones. -PET scan done after 8 cycles of treatment for restaging disease showed significant response to treatment with no evidence of active disease. Patient has no clinical evidence of disease progression at this time. CT chest 06/16/2015 showed no evidence of cancer progression. PET/CT scan done on 10/21/2015- showed no findings for metastatic disease. New right-sided hydronephrosis versus local tumor recurrence needs to be evaluated further. MRI of the kidney showed hyperenhancing the right renal pelvis lesion consistent with concern for local recurrence. -PET/CT 03/06/2016 with no findings of metastatic disease. Extensive right-sided hydronephrosis with high activity from excreted FDG obscuring any findings along the collecting system. -PET/CT  06/13/2016- Interval increase in size of right kidney. Findings are  suspicious for progression of residual/recurrent urothelial carcinoma with further dilatation of the right renal collecting system. 2. No evidence for hypermetabolic metastases.  -PET/CT 10/22/2016 - Continued interval progression of the abnormal soft tissue associated with the right kidney. This soft tissue remains markedly hypermetabolic and is compatible with continued progression of recurrent right renal disease. 2. No evidence for hypermetabolic metastases in the neck, chest, abdomen, or pelvis. PET/CT 04/04/2017-  Interval enlargement of and increase in hypermetabolism within the right renal mass, consistent with residual/recurrent disease. Increase in disease progression along the proximal right ureter. 2. No evidence of abdominopelvic nodal or extra abdominal metastatic disease.  09/03/17 PET which revealed 1. Considerable enlargement of the RIGHT renal hilar mass with persistent intense peripheral hypermetabolic activity consistent with neoplasm. 2. Extension of neoplasm into the proximal RIGHT ureter with mild inferior progression (2 cm)  #2 Bone metastases   Plan:  -continue Xgeva to q12 weeks since she has completed 2 yrs of this treatment already. -transfuse prn for hgb <8 or if symptomatic. -Pt finished palliative radiation with Dr. Tammi Klippel -Discussed pt labwork today, 11/07/17; HGB decreased to 8.6, -Will monitor her HGB closely -Will hold Atezolizumab until infection is cleared and diarrhea is stable. -Ordering one unit IVF for mild dehydration -will added cholestyramine BID to help with diarrhea -Advised continuing to stay well hydrated and eating as best she can   #3 h/o skin rash likely related to her Atezolizumab. Minimal grade 1 and controlled with topical triamcinolone. Unchanged today.  #4 chronic bronchiectasis with Cough and some clear secretions likely due to bronchiectasis. Followed by Dr Melvyn Novas. -No chest pain no increased shortness of breath or dyspnea on exertion  -Has an  action plan with Dr. Melvyn Novas to use when necessary antibiotics for worsening symptoms. -Continue follow-up with Dr. 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 Wynetta Emery.  But stopped using it due to cost issues. Was then on PO budesonide which she has stopped as well. last sigmoidscopy with Dr Wynetta Emery - showed stable ulcerative proctitis  -transfuse prn for hgb<8. Does not report overt rectal bleeding at this time.  #6 Recent Salmonella enteritis and C. Diff -- returned -Continue Fidaxomicin and let Dr. Baxter Flattery know if symptoms are not alleviating. Per ID Salmonella - likely colonizer -Will refer pt to GI for further evaluation  and management of diarrhea (multifactorial C diff+ Immune+ Radiation) -Ordering Cholestyramine  -Referring pt to our nutritional therapist Ernestene Kiel -One unit IVF today  #7 history of hypertension -on Amlodipine    Hold treatment today Reschedule treatment with Atezolizumab in 3 weeks Urgent referral to [facility]  IV NS x 1 liter today RTC with Sandi Mealy in 1 week with labs for re-evaluation of diarrhea, need for fluids RTC with Dr Irene Limbo in 3 weeks with labs Referral to [place] neff urgently     All of the patients questions were answered with apparent satisfaction. The patient knows to call the clinic with any problems, questions or concerns.  The total time spent in the appt was 30 minutes and more than 50% was on counseling and direct patient cares.      Sullivan Lone MD [facility] Hematology/Oncology Physician [facility]  (Office):       [phone] (Work cell):  [phone] (Fax):           [phone]  I, Baldwin [place], am acting as a scribe for Dr. Irene Limbo  .I have reviewed the above documentation for accuracy and completeness, and I agree with the  above. [name] MD

## 2017-11-07 ENCOUNTER — Telehealth: Payer: Self-pay | Admitting: Hematology

## 2017-11-07 ENCOUNTER — Inpatient Hospital Stay: Payer: Medicare Other | Attending: Hematology | Admitting: Hematology

## 2017-11-07 ENCOUNTER — Other Ambulatory Visit: Payer: Self-pay

## 2017-11-07 ENCOUNTER — Inpatient Hospital Stay: Payer: Medicare Other

## 2017-11-07 ENCOUNTER — Other Ambulatory Visit: Payer: Self-pay | Admitting: Hematology

## 2017-11-07 ENCOUNTER — Encounter: Payer: Self-pay | Admitting: Hematology

## 2017-11-07 VITALS — BP 123/61 | HR 81 | Temp 97.9°F | Resp 18 | Ht 60.0 in | Wt 109.3 lb

## 2017-11-07 DIAGNOSIS — J479 Bronchiectasis, uncomplicated: Secondary | ICD-10-CM | POA: Insufficient documentation

## 2017-11-07 DIAGNOSIS — T148XXA Other injury of unspecified body region, initial encounter: Secondary | ICD-10-CM | POA: Insufficient documentation

## 2017-11-07 DIAGNOSIS — Z9221 Personal history of antineoplastic chemotherapy: Secondary | ICD-10-CM | POA: Insufficient documentation

## 2017-11-07 DIAGNOSIS — Z923 Personal history of irradiation: Secondary | ICD-10-CM | POA: Insufficient documentation

## 2017-11-07 DIAGNOSIS — Z95828 Presence of other vascular implants and grafts: Secondary | ICD-10-CM

## 2017-11-07 DIAGNOSIS — L271 Localized skin eruption due to drugs and medicaments taken internally: Secondary | ICD-10-CM | POA: Insufficient documentation

## 2017-11-07 DIAGNOSIS — D649 Anemia, unspecified: Secondary | ICD-10-CM | POA: Insufficient documentation

## 2017-11-07 DIAGNOSIS — I10 Essential (primary) hypertension: Secondary | ICD-10-CM | POA: Insufficient documentation

## 2017-11-07 DIAGNOSIS — K51211 Ulcerative (chronic) proctitis with rectal bleeding: Secondary | ICD-10-CM | POA: Insufficient documentation

## 2017-11-07 DIAGNOSIS — C651 Malignant neoplasm of right renal pelvis: Secondary | ICD-10-CM

## 2017-11-07 DIAGNOSIS — C7951 Secondary malignant neoplasm of bone: Secondary | ICD-10-CM | POA: Diagnosis not present

## 2017-11-07 DIAGNOSIS — E440 Moderate protein-calorie malnutrition: Secondary | ICD-10-CM

## 2017-11-07 DIAGNOSIS — C659 Malignant neoplasm of unspecified renal pelvis: Secondary | ICD-10-CM

## 2017-11-07 DIAGNOSIS — R197 Diarrhea, unspecified: Secondary | ICD-10-CM

## 2017-11-07 DIAGNOSIS — E860 Dehydration: Secondary | ICD-10-CM | POA: Insufficient documentation

## 2017-11-07 DIAGNOSIS — A020 Salmonella enteritis: Secondary | ICD-10-CM | POA: Diagnosis not present

## 2017-11-07 DIAGNOSIS — A0472 Enterocolitis due to Clostridium difficile, not specified as recurrent: Secondary | ICD-10-CM | POA: Diagnosis not present

## 2017-11-07 LAB — CMP (CANCER CENTER ONLY)
ALT: 8 U/L (ref 0–44)
AST: 15 U/L (ref 15–41)
Albumin: 2.3 g/dL — ABNORMAL LOW (ref 3.5–5.0)
Alkaline Phosphatase: 42 U/L (ref 38–126)
Anion gap: 9 (ref 5–15)
BUN: 13 mg/dL (ref 8–23)
CHLORIDE: 101 mmol/L (ref 98–111)
CO2: 23 mmol/L (ref 22–32)
CREATININE: 0.87 mg/dL (ref 0.44–1.00)
Calcium: 8.4 mg/dL — ABNORMAL LOW (ref 8.9–10.3)
GFR, Est AFR Am: >60 mL/min (ref >60)
GFR, Estimated: 57 mL/min — ABNORMAL LOW (ref >60)
Glucose, Bld: 125 mg/dL — ABNORMAL HIGH (ref 70–99)
Potassium: 4.7 mmol/L (ref 3.5–5.1)
Sodium: 133 mmol/L — ABNORMAL LOW (ref 135–145)
Total Bilirubin: 0.5 mg/dL (ref 0.3–1.2)
Total Protein: 6 g/dL — ABNORMAL LOW (ref 6.5–8.1)

## 2017-11-07 LAB — CBC WITH DIFFERENTIAL/PLATELET
Basophils Absolute: 0 10*3/uL (ref 0.0–0.1)
Basophils Relative: 0 %
EOS ABS: 0 10*3/uL (ref 0.0–0.5)
EOS PCT: 0 %
HCT: 27 % — ABNORMAL LOW (ref 34.8–46.6)
Hemoglobin: 8.6 g/dL — ABNORMAL LOW (ref 11.6–15.9)
LYMPHS ABS: 0.9 10*3/uL (ref 0.9–3.3)
Lymphocytes Relative: 19 %
MCH: 29.2 pg (ref 25.1–34.0)
MCHC: 31.9 g/dL (ref 31.5–36.0)
MCV: 91.5 fL (ref 79.5–101.0)
MONOS PCT: 14 %
Monocytes Absolute: 0.7 10*3/uL (ref 0.1–0.9)
Neutro Abs: 3.2 10*3/uL (ref 1.5–6.5)
Neutrophils Relative %: 67 %
PLATELETS: 175 10*3/uL (ref 145–400)
RBC: 2.95 MIL/uL — ABNORMAL LOW (ref 3.70–5.45)
RDW: 22.1 % — ABNORMAL HIGH (ref 11.2–14.5)
WBC: 4.8 10*3/uL (ref 3.9–10.3)

## 2017-11-07 LAB — MAGNESIUM: MAGNESIUM: 1.9 mg/dL (ref 1.7–2.4)

## 2017-11-07 MED ORDER — SODIUM CHLORIDE 0.9 % IJ SOLN
10.0000 mL | INTRAMUSCULAR | Status: DC | PRN
  Administered 2017-11-07: 10 mL via INTRAVENOUS
  Filled 2017-11-07: qty 10

## 2017-11-07 MED ORDER — HEPARIN SOD (PORK) LOCK FLUSH 100 UNIT/ML IV SOLN
500.0000 [IU] | Freq: Once | INTRAVENOUS | Status: AC | PRN
  Administered 2017-11-07: 500 [IU] via INTRAVENOUS
  Filled 2017-11-07: qty 5

## 2017-11-07 MED ORDER — DENOSUMAB 120 MG/1.7ML ~~LOC~~ SOLN
120.0000 mg | Freq: Once | SUBCUTANEOUS | Status: AC
  Administered 2017-11-07: 120 mg via SUBCUTANEOUS

## 2017-11-07 MED ORDER — SODIUM CHLORIDE 0.9 % IV SOLN
INTRAVENOUS | Status: AC
  Administered 2017-11-07: 12:00:00 via INTRAVENOUS
  Filled 2017-11-07 (×2): qty 250

## 2017-11-07 MED ORDER — DENOSUMAB 120 MG/1.7ML ~~LOC~~ SOLN
SUBCUTANEOUS | Status: AC
  Filled 2017-11-07: qty 1.7

## 2017-11-07 MED ORDER — CHOLESTYRAMINE 4 G PO PACK: 4.0000 g | PACK | Freq: Two times a day (BID) | ORAL | 1 refills | Status: DC

## 2017-11-07 NOTE — Telephone Encounter (Signed)
Appointments scheduled referral added to proficient per 8/8 los

## 2017-11-07 NOTE — Patient Instructions (Signed)
Ordering a medication to help your diarrhea called Cholestyramine Will provide a referral to [facility] (GI) Giving you one unit of IV fluids Will also refer you to our nutritional therapist

## 2017-11-12 ENCOUNTER — Inpatient Hospital Stay: Payer: Medicare Other | Admitting: Nutrition

## 2017-11-12 NOTE — Progress Notes (Signed)
84 year old female diagnosed with metastatic transitional cell carcinoma.  Past medical history includes esophagus dilatation, GERD, gout, hypertension.  Medications include Questran, Lasix, Remeron, and Zofran.  Labs include glucose 125 and sodium 133 on August 8  Height: 5 feet 0 inches. Weight: 109.3 pounds. Usual body weight: 125 pounds. BMI: 21.35.  Patient reports she has a poor appetite.   States she has been dealing with diarrhea for at least 3 months. Noted C. difficile positive. Patient is status post radiation therapy.  Nutrition diagnosis:  Unintended weight loss related to altered GI function as evidenced by approximate 16 pound weight loss from usual body weight.  Intervention: Patient educated to consume smaller more frequent meals and snacks with high-calorie high-protein foods. Educated patient on low fiber diet. Encourage patient to try to eat white rice, bananas, applesauce, and white toast more often. Recommended patient switch over to Lactaid milk. Recommended she continue oral nutrition supplements as tolerated. Fact sheets were given.  Contact information provided.  Questions were answered.  Teach back method used.  Monitoring, evaluation, goals: Patient will tolerate low fiber diet with increased calories and protein to improve diarrhea and minimize weight loss.  Next visit: Thursday, August 29 during infusion.  **Disclaimer: This note was dictated with voice recognition software. Similar sounding words can inadvertently be transcribed and this note may contain transcription errors which may not have been corrected upon publication of note.**

## 2017-11-14 ENCOUNTER — Telehealth: Payer: Self-pay | Admitting: Emergency Medicine

## 2017-11-14 ENCOUNTER — Telehealth: Payer: Self-pay | Admitting: Hematology

## 2017-11-14 ENCOUNTER — Inpatient Hospital Stay: Payer: Medicare Other

## 2017-11-14 ENCOUNTER — Inpatient Hospital Stay (HOSPITAL_BASED_OUTPATIENT_CLINIC_OR_DEPARTMENT_OTHER): Payer: Medicare Other | Admitting: Medical

## 2017-11-14 ENCOUNTER — Other Ambulatory Visit: Payer: Self-pay | Admitting: Emergency Medicine

## 2017-11-14 VITALS — BP 108/57 | HR 86 | Temp 97.9°F | Resp 17 | Ht 60.0 in | Wt 109.3 lb

## 2017-11-14 DIAGNOSIS — A020 Salmonella enteritis: Secondary | ICD-10-CM | POA: Diagnosis not present

## 2017-11-14 DIAGNOSIS — L0889 Other specified local infections of the skin and subcutaneous tissue: Secondary | ICD-10-CM

## 2017-11-14 DIAGNOSIS — C659 Malignant neoplasm of unspecified renal pelvis: Secondary | ICD-10-CM

## 2017-11-14 DIAGNOSIS — C651 Malignant neoplasm of right renal pelvis: Secondary | ICD-10-CM | POA: Diagnosis not present

## 2017-11-14 DIAGNOSIS — Z9221 Personal history of antineoplastic chemotherapy: Secondary | ICD-10-CM | POA: Diagnosis not present

## 2017-11-14 DIAGNOSIS — Z95828 Presence of other vascular implants and grafts: Secondary | ICD-10-CM

## 2017-11-14 DIAGNOSIS — D649 Anemia, unspecified: Secondary | ICD-10-CM | POA: Diagnosis not present

## 2017-11-14 DIAGNOSIS — Z923 Personal history of irradiation: Secondary | ICD-10-CM | POA: Diagnosis not present

## 2017-11-14 DIAGNOSIS — C7951 Secondary malignant neoplasm of bone: Secondary | ICD-10-CM | POA: Diagnosis not present

## 2017-11-14 DIAGNOSIS — T148XXA Other injury of unspecified body region, initial encounter: Secondary | ICD-10-CM | POA: Diagnosis not present

## 2017-11-14 DIAGNOSIS — A0472 Enterocolitis due to Clostridium difficile, not specified as recurrent: Secondary | ICD-10-CM

## 2017-11-14 DIAGNOSIS — L271 Localized skin eruption due to drugs and medicaments taken internally: Secondary | ICD-10-CM | POA: Diagnosis not present

## 2017-11-14 DIAGNOSIS — K51211 Ulcerative (chronic) proctitis with rectal bleeding: Secondary | ICD-10-CM | POA: Diagnosis not present

## 2017-11-14 DIAGNOSIS — R197 Diarrhea, unspecified: Secondary | ICD-10-CM

## 2017-11-14 DIAGNOSIS — E860 Dehydration: Secondary | ICD-10-CM | POA: Diagnosis not present

## 2017-11-14 DIAGNOSIS — I10 Essential (primary) hypertension: Secondary | ICD-10-CM | POA: Diagnosis not present

## 2017-11-14 DIAGNOSIS — J479 Bronchiectasis, uncomplicated: Secondary | ICD-10-CM | POA: Diagnosis not present

## 2017-11-14 LAB — CBC WITH DIFFERENTIAL/PLATELET
BASOS ABS: 0 10*3/uL (ref 0.0–0.1)
Basophils Relative: 1 %
EOS ABS: 0 10*3/uL (ref 0.0–0.5)
EOS PCT: 0 %
HCT: 25 % — ABNORMAL LOW (ref 34.8–46.6)
Hemoglobin: 8.2 g/dL — ABNORMAL LOW (ref 11.6–15.9)
LYMPHS PCT: 25 %
Lymphs Abs: 1.3 10*3/uL (ref 0.9–3.3)
MCH: 30.1 pg (ref 25.1–34.0)
MCHC: 32.9 g/dL (ref 31.5–36.0)
MCV: 91.5 fL (ref 79.5–101.0)
MONO ABS: 0.8 10*3/uL (ref 0.1–0.9)
Monocytes Relative: 16 %
Neutro Abs: 3.1 10*3/uL (ref 1.5–6.5)
Neutrophils Relative %: 58 %
PLATELETS: 271 10*3/uL (ref 145–400)
RBC: 2.73 MIL/uL — ABNORMAL LOW (ref 3.70–5.45)
RDW: 23.9 % — AB (ref 11.2–14.5)
WBC: 5.3 10*3/uL (ref 3.9–10.3)

## 2017-11-14 LAB — CMP (CANCER CENTER ONLY)
ALK PHOS: 46 U/L (ref 38–126)
ALT: 8 U/L (ref 0–44)
AST: 14 U/L — AB (ref 15–41)
Albumin: 2.2 g/dL — ABNORMAL LOW (ref 3.5–5.0)
Anion gap: 8 (ref 5–15)
BILIRUBIN TOTAL: 0.5 mg/dL (ref 0.3–1.2)
BUN: 13 mg/dL (ref 8–23)
CALCIUM: 8.5 mg/dL — AB (ref 8.9–10.3)
CO2: 28 mmol/L (ref 22–32)
CREATININE: 0.84 mg/dL (ref 0.44–1.00)
Chloride: 98 mmol/L (ref 98–111)
GFR, Est AFR Am: >60 mL/min (ref >60)
GFR, Estimated: 59 mL/min — ABNORMAL LOW (ref >60)
GLUCOSE: 142 mg/dL — AB (ref 70–99)
POTASSIUM: 4.3 mmol/L (ref 3.5–5.1)
Sodium: 134 mmol/L — ABNORMAL LOW (ref 135–145)
TOTAL PROTEIN: 6.1 g/dL — AB (ref 6.5–8.1)

## 2017-11-14 LAB — SAMPLE TO BLOOD BANK

## 2017-11-14 LAB — MAGNESIUM: Magnesium: 2 mg/dL (ref 1.7–2.4)

## 2017-11-14 MED ORDER — SODIUM CHLORIDE 0.9 % IJ SOLN
10.0000 mL | INTRAMUSCULAR | Status: DC | PRN
  Administered 2017-11-14: 10 mL via INTRAVENOUS
  Filled 2017-11-14: qty 10

## 2017-11-14 MED ORDER — HEPARIN SOD (PORK) LOCK FLUSH 100 UNIT/ML IV SOLN
500.0000 [IU] | Freq: Once | INTRAVENOUS | Status: AC | PRN
  Administered 2017-11-14: 500 [IU] via INTRAVENOUS
  Filled 2017-11-14: qty 5

## 2017-11-14 MED ORDER — LOPERAMIDE HCL 2 MG PO CAPS: 2.0000 mg | ORAL_CAPSULE | ORAL | 5 refills | Status: DC | PRN

## 2017-11-14 NOTE — Progress Notes (Signed)
Bedbug located on pt's clothing by PA Lucianne Lei.  Specimen taken to lab.  Pt aware.  MD [facility] aware.  No other bedbugs found at this time.  Pt will return for blood transfusion on 8/16 and will be placed in same room.

## 2017-11-14 NOTE — Telephone Encounter (Signed)
Told pt to be here at 0900 for blood transfusion appt in [facility].  Verbalized understanding.

## 2017-11-14 NOTE — Patient Instructions (Signed)
Implanted Port Home Guide An implanted port is a type of central line that is placed under the skin. Central lines are used to provide IV access when treatment or nutrition needs to be given through a person's veins. Implanted ports are used for long-term IV access. An implanted port may be placed because:  You need IV medicine that would be irritating to the small veins in your hands or arms.  You need long-term IV medicines, such as antibiotics.  You need IV nutrition for a long period.  You need frequent blood draws for lab tests.  You need dialysis.  Implanted ports are usually placed in the chest area, but they can also be placed in the upper arm, the abdomen, or the leg. An implanted port has two main parts:  Reservoir. The reservoir is round and will appear as a small, raised area under your skin. The reservoir is the part where a needle is inserted to give medicines or draw blood.  Catheter. The catheter is a thin, flexible tube that extends from the reservoir. The catheter is placed into a large vein. Medicine that is inserted into the reservoir goes into the catheter and then into the vein.  How will I care for my incision site? Do not get the incision site wet. Bathe or shower as directed by your health care provider. How is my port accessed? Special steps must be taken to access the port:  Before the port is accessed, a numbing cream can be placed on the skin. This helps numb the skin over the port site.  Your health care provider uses a sterile technique to access the port. ? Your health care provider must put on a mask and sterile gloves. ? The skin over your port is cleaned carefully with an antiseptic and allowed to dry. ? The port is gently pinched between sterile gloves, and a needle is inserted into the port.  Only "non-coring" port needles should be used to access the port. Once the port is accessed, a blood return should be checked. This helps ensure that the port  is in the vein and is not clogged.  If your port needs to remain accessed for a constant infusion, a clear (transparent) bandage will be placed over the needle site. The bandage and needle will need to be changed every week, or as directed by your health care provider.  Keep the bandage covering the needle clean and dry. Do not get it wet. Follow your health care provider's instructions on how to take a shower or bath while the port is accessed.  If your port does not need to stay accessed, no bandage is needed over the port.  What is flushing? Flushing helps keep the port from getting clogged. Follow your health care provider's instructions on how and when to flush the port. Ports are usually flushed with saline solution or a medicine called heparin. The need for flushing will depend on how the port is used.  If the port is used for intermittent medicines or blood draws, the port will need to be flushed: ? After medicines have been given. ? After blood has been drawn. ? As part of routine maintenance.  If a constant infusion is running, the port may not need to be flushed.  How long will my port stay implanted? The port can stay in for as long as your health care provider thinks it is needed. When it is time for the port to come out, surgery will be   done to remove it. The procedure is similar to the one performed when the port was put in. When should I seek immediate medical care? When you have an implanted port, you should seek immediate medical care if:  You notice a bad smell coming from the incision site.  You have swelling, redness, or drainage at the incision site.  You have more swelling or pain at the port site or the surrounding area.  You have a fever that is not controlled with medicine.  This information is not intended to replace advice given to you by your health care provider. Make sure you discuss any questions you have with your health care provider. Document  Released: 03/19/2005 Document Revised: 08/25/2015 Document Reviewed: 11/24/2012 Elsevier Interactive Patient Education  2017 Elsevier Inc.  

## 2017-11-14 NOTE — Telephone Encounter (Signed)
Per conversation with Lurlean Horns patient is to be seen in Symptom mgmt due to bed bugs and C-Diff/ per 8/15 sch msg

## 2017-11-15 ENCOUNTER — Inpatient Hospital Stay: Payer: Medicare Other

## 2017-11-15 VITALS — BP 122/65 | HR 74 | Temp 98.0°F | Resp 16 | Wt 109.2 lb

## 2017-11-15 DIAGNOSIS — Z9221 Personal history of antineoplastic chemotherapy: Secondary | ICD-10-CM | POA: Diagnosis not present

## 2017-11-15 DIAGNOSIS — A020 Salmonella enteritis: Secondary | ICD-10-CM | POA: Diagnosis not present

## 2017-11-15 DIAGNOSIS — L271 Localized skin eruption due to drugs and medicaments taken internally: Secondary | ICD-10-CM | POA: Diagnosis not present

## 2017-11-15 DIAGNOSIS — J479 Bronchiectasis, uncomplicated: Secondary | ICD-10-CM | POA: Diagnosis not present

## 2017-11-15 DIAGNOSIS — T148XXA Other injury of unspecified body region, initial encounter: Secondary | ICD-10-CM | POA: Diagnosis not present

## 2017-11-15 DIAGNOSIS — C651 Malignant neoplasm of right renal pelvis: Secondary | ICD-10-CM

## 2017-11-15 DIAGNOSIS — A0472 Enterocolitis due to Clostridium difficile, not specified as recurrent: Secondary | ICD-10-CM | POA: Diagnosis not present

## 2017-11-15 DIAGNOSIS — D649 Anemia, unspecified: Secondary | ICD-10-CM | POA: Diagnosis not present

## 2017-11-15 DIAGNOSIS — E860 Dehydration: Secondary | ICD-10-CM | POA: Diagnosis not present

## 2017-11-15 DIAGNOSIS — I10 Essential (primary) hypertension: Secondary | ICD-10-CM | POA: Diagnosis not present

## 2017-11-15 DIAGNOSIS — C7951 Secondary malignant neoplasm of bone: Secondary | ICD-10-CM | POA: Diagnosis not present

## 2017-11-15 DIAGNOSIS — K51211 Ulcerative (chronic) proctitis with rectal bleeding: Secondary | ICD-10-CM | POA: Diagnosis not present

## 2017-11-15 DIAGNOSIS — Z95828 Presence of other vascular implants and grafts: Secondary | ICD-10-CM

## 2017-11-15 DIAGNOSIS — Z923 Personal history of irradiation: Secondary | ICD-10-CM | POA: Diagnosis not present

## 2017-11-15 LAB — PREPARE RBC (CROSSMATCH)

## 2017-11-15 MED ORDER — HEPARIN SOD (PORK) LOCK FLUSH 100 UNIT/ML IV SOLN
500.0000 [IU] | Freq: Every day | INTRAVENOUS | Status: AC | PRN
  Administered 2017-11-15: 500 [IU]
  Filled 2017-11-15: qty 5

## 2017-11-15 MED ORDER — DIPHENHYDRAMINE HCL 25 MG PO CAPS
25.0000 mg | ORAL_CAPSULE | Freq: Once | ORAL | Status: AC
  Administered 2017-11-15: 25 mg via ORAL

## 2017-11-15 MED ORDER — DIPHENHYDRAMINE HCL 25 MG PO CAPS
ORAL_CAPSULE | ORAL | Status: AC
  Filled 2017-11-15: qty 1

## 2017-11-15 MED ORDER — SODIUM CHLORIDE 0.9 % IJ SOLN
10.0000 mL | INTRAMUSCULAR | Status: DC | PRN
  Administered 2017-11-15: 10 mL via INTRAVENOUS
  Filled 2017-11-15: qty 10

## 2017-11-15 MED ORDER — ACETAMINOPHEN 325 MG PO TABS
650.0000 mg | ORAL_TABLET | Freq: Once | ORAL | Status: AC
  Administered 2017-11-15: 650 mg via ORAL

## 2017-11-15 MED ORDER — ACETAMINOPHEN 325 MG PO TABS
ORAL_TABLET | ORAL | Status: AC
  Filled 2017-11-15: qty 2

## 2017-11-15 MED ORDER — FUROSEMIDE 10 MG/ML IJ SOLN: 20.0000 mg | Freq: Once | INTRAMUSCULAR | Status: DC

## 2017-11-15 NOTE — Progress Notes (Signed)
[facility] Progress Note   Tiffany [name] [id number] [birth year] 84 y.o.  Tiffany [name] is managed by Dr. Sullivan Lone  Actively treated with chemotherapy/immunotherapy: yes  Current Therapy: Tecentriq  Assessment: Plan:    Anemia, unspecified type - Plan: Type and screen, DISCONTINUED: heparin lock flush 100 unit/mL, DISCONTINUED: furosemide (LASIX) injection 20 mg, DISCONTINUED: acetaminophen (TYLENOL) tablet 650 mg, DISCONTINUED: diphenhydrAMINE (BENADRYL) capsule 25 mg, CANCELED: Practitioner attestation of consent, CANCELED: Complete patient signature process for consent form, CANCELED: Care order/instruction, CANCELED: Prepare RBC, CANCELED: Transfuse RBC  Transitional cell carcinoma of renal pelvis, unspecified laterality ([place]) - Plan: CANCELED: Phosphorus  Diarrhea in adult patient - Plan: CANCELED: Phosphorus  Clostridium difficile enterocolitis - Plan: CANCELED: Phosphorus  Port catheter in place - Plan: heparin lock flush 100 unit/mL, sodium chloride 0.9 % injection 10 mL, DISCONTINUED: sodium chloride 0.9 % injection 10 mL  Transitional cell carcinoma of renal pelvis, right (HCC) - Plan: heparin lock flush 100 unit/mL, sodium chloride 0.9 % injection 10 mL, DISCONTINUED: sodium chloride 0.9 % injection 10 mL  Infected bedbug bite   Anemia: CBC returned today with a WBC of 8.2.  Given the fact that the patient is symptomatic she will return tomorrow for transfusion of 2 units of packed red blood cells.  Diarrhea with a history of C. difficile and Salmonella infection: The patient is pending an appointment with infectious disease.    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 Sullivan Lone.  She is scheduled to see Dr. Sullivan Lone and follow-up on 11/28/2017.  Bedbug infection: A bedbug was taken off of the patient's clothing today.  She and her husband have been  instructed to contact an exterminator.  Please see After Visit Summary for patient specific instructions.  Future Appointments  Date Time Provider [place]  11/18/2017 10:45 AM Carlyle Basques, MD [facility]  11/27/2017  2:30 PM Bruning, Ashlyn, PA-C [facility] None  11/28/2017  8:30 AM [facility] LAB 5 [facility] None  11/28/2017  9:00 AM [name], MD [facility] None  11/28/2017 10:30 AM [facility] INFUSION [facility] None  11/28/2017 11:15 AM Karie Mainland, RD [facility] None  12/19/2017  9:00 AM [facility] LAB 3 [facility] None  12/19/2017 10:00 AM [facility] INFUSION [facility] None  01/09/2018  9:15 AM [facility] LAB 2 [facility] None  01/09/2018 10:15 AM [facility] INFUSION [facility] None  01/30/2018  9:15 AM [facility] LAB 2 [facility] None  01/30/2018 10:15 AM [facility] INFUSION [facility] None    Orders Placed This Encounter  Procedures  . Type and screen       Subjective:   Patient ID:  Tiffany [name] is a 84 y.o. (DOB [birth year]) female.  Chief Complaint:  Chief Complaint  Patient presents with  . Fatigue    HPI Tiffany [name] is a 84 year old female with a history of a metastatic renal cell carcinoma who is managed by Dr. Sullivan Lone and is treated with Tecentriq.  Patient additionally has a history of chronic diarrhea secondary to C. difficile and a chronic Salmonella infection.  She continues to have diarrhea multiple times per day.  She was hospitalized in April for diarrhea.  She reports fatigue but denies nausea, vomiting, fevers, chills, sweats, shortness of breath, or chest pain.  She reports that she and her husband spray their home for aunts occasionally but denies any other evidence of bug infestation.  Medications: I have reviewed  the patient's current medications.  Allergies:  Allergies  Allergen Reactions  . Nitrofurantoin Other (See Comments)    Trembling  . Indomethacin Swelling and Other (See Comments)     Tongue swelling  . Lisinopril Swelling and Other (See Comments)    Tongue swelling    Past Medical History:  Diagnosis Date  . Arthritis     knees 03-10-12 had Cortisone injection  . Cancer [facility]) june 2016   metastatic  . DDD (degenerative disc disease), cervical   . Edema leg    feet and ankles  . Esophagus disorder    Had esophagus stretched  . GERD (gastroesophageal reflux disease)    Benign stricture dilated in 2011  . Gout    Patient notes she has possible gout but has not been on chronic medications for this  . Headache(784.0)   . Hypertension   . Occipital neuralgia   . Occipital neuralgia    Related to cervical degenerative disc disease  . Peptic ulcer disease    Previous history in the remote past  . Ulcerative proctitis ([place])   . Ulcerative proctitis ([place]) 2014    Past Surgical History:  Procedure Laterality Date  . ABDOMINAL HYSTERECTOMY    . APPENDECTOMY    . CHOLECYSTECTOMY    . COLONOSCOPY WITH PROPOFOL  03/25/2012   Procedure: COLONOSCOPY WITH PROPOFOL;  Surgeon: Garlan Fair, MD;  Location: [facility];  Service: Endoscopy;  Laterality: N/A;  . DILATION AND CURETTAGE OF UTERUS    . ESOPHAGEAL DILATION     For benign stricture in 2011  . ESOPHAGOGASTRODUODENOSCOPY    . FLEXIBLE SIGMOIDOSCOPY N/A 04/26/2014   Procedure: FLEXIBLE SIGMOIDOSCOPY - UnSedated;  Surgeon: Garlan Fair, MD;  Location: [facility];  Service: Endoscopy;  Laterality: N/A;  . FLEXIBLE SIGMOIDOSCOPY N/A 01/24/2015   Procedure: FLEXIBLE SIGNMOIDOSCOPY W/ FLEET ENEMIA;  Surgeon: Garlan Fair, MD;  Location: [facility];  Service: Endoscopy;  Laterality: N/A;  . FLEXIBLE SIGMOIDOSCOPY N/A 05/15/2016   Procedure: FLEXIBLE SIGMOIDOSCOPY;  Surgeon: Garlan Fair, MD;  Location: [facility];  Service: Endoscopy;  Laterality: N/A;  pt needs to ahve an enema upon arrival   . NECK SURGERY     20 years ago  . TOTAL ABDOMINAL HYSTERECTOMY W/ BILATERAL SALPINGOOPHORECTOMY     At  age 37 due to miscarriage and retained products of conception causing significant uterine bleeding. Patient has been on estrogen replacement therapy since then.    Family History  Problem Relation Age of Onset  . Lung cancer Sister   . Prostate cancer Brother   . Uterine cancer Maternal Aunt   . Breast cancer Paternal Grandmother   . Hodgkin's lymphoma Sister     Social History   Socioeconomic History  . Marital status: Married    Spouse name: Not on file  . Number of children: Not on file  . Years of education: Not on file  . Highest education level: Not on file  Occupational History  . Not on file  Social Needs  . Financial resource strain: Not on file  . Food insecurity:    Worry: Not on file    Inability: Not on file  . Transportation needs:    Medical: Not on file    Non-medical: Not on file  Tobacco Use  . Smoking status: Former Smoker    Last attempt to quit: 11/01/1983    Years since quitting: 34.0  . Smokeless tobacco: Never Used  Substance and Sexual Activity  .  Alcohol use: Yes    Comment: occ  . Drug use: No  . Sexual activity: Never  Lifestyle  . Physical activity:    Days per week: Not on file    Minutes per session: Not on file  . Stress: Not on file  Relationships  . Social connections:    Talks on phone: Not on file    Gets together: Not on file    Attends religious service: Not on file    Active member of club or organization: Not on file    Attends meetings of clubs or organizations: Not on file    Relationship status: Not on file  . Intimate partner violence:    Fear of current or ex partner: Not on file    Emotionally abused: Not on file    Physically abused: Not on file    Forced sexual activity: Not on file  Other Topics Concern  . Not on file  Social History Narrative  . Not on file    Past Medical History, Surgical history, Social history, and Family history were reviewed and updated as appropriate.   Please see review of  systems for further details on the patient's review from today.   Review of Systems:  Review of Systems  Constitutional: Positive for fatigue. Negative for appetite change, chills, diaphoresis and fever.  HENT: Negative for dental problem, mouth sores and trouble swallowing.   Respiratory: Negative for cough, chest tightness and shortness of breath.   Cardiovascular: Negative for chest pain and palpitations.  Gastrointestinal: Negative for constipation, diarrhea, nausea and vomiting.  Neurological: Negative for dizziness, syncope, weakness and headaches.    Objective:   Physical Exam:  BP (!) 108/57 (BP Location: Right Arm, Patient Position: Sitting) Comment: nurse aware  Pulse 86   Temp 97.9 F (36.6 C) (Oral)   Resp 17   Ht 5' (1.524 m)   Wt 109 lb 4.8 oz (49.6 kg)   SpO2 97%   BMI 21.35 kg/m  ECOG: 1  Physical Exam  Constitutional: No distress.  HENT:  Head: Normocephalic and atraumatic.  Cardiovascular: Normal rate, regular rhythm and normal heart sounds. Exam reveals no friction rub.  No murmur heard. Pulmonary/Chest: Effort normal and breath sounds normal. No stridor. No respiratory distress. She has no wheezes. She has no rales.  Neurological: She is alert. Coordination normal.  Skin: Skin is warm and dry. She is not diaphoretic. No erythema. No pallor.  Psychiatric: She has a normal mood and affect. Her behavior is normal. Judgment and thought content normal.    Lab Review:     Component Value Date/Time   NA 134 (L) 11/14/2017 1250   NA 139 03/21/2017 1352   K 4.3 11/14/2017 1250   K 4.3 03/21/2017 1352   CL 98 11/14/2017 1250   CO2 28 11/14/2017 1250   CO2 25 03/21/2017 1352   GLUCOSE 142 (H) 11/14/2017 1250   GLUCOSE 141 (H) 03/21/2017 1352   BUN 13 11/14/2017 1250   BUN 19.5 03/21/2017 1352   CREATININE 0.84 11/14/2017 1250   CREATININE 1.1 03/21/2017 1352   CALCIUM 8.5 (L) 11/14/2017 1250   CALCIUM 9.4 03/21/2017 1352   PROT 6.1 (L) 11/14/2017 1250    PROT 7.5 03/21/2017 1352   ALBUMIN 2.2 (L) 11/14/2017 1250   ALBUMIN 3.2 (L) 03/21/2017 1352   AST 14 (L) 11/14/2017 1250   AST 13 03/21/2017 1352   ALT 8 11/14/2017 1250   ALT <6 03/21/2017 1352   ALKPHOS 46  11/14/2017 1250   ALKPHOS 44 03/21/2017 1352   BILITOT 0.5 11/14/2017 1250   BILITOT 0.32 03/21/2017 1352   GFRNONAA 59 (L) 11/14/2017 1250   GFRAA >60 11/14/2017 1250       Component Value Date/Time   WBC 5.3 11/14/2017 1250   RBC 2.73 (L) 11/14/2017 1250   HGB 8.2 (L) 11/14/2017 1250   HGB 8.3 (L) 08/15/2017 1121   HGB 9.0 (L) 03/21/2017 1352   HCT 25.0 (L) 11/14/2017 1250   HCT 29.3 (L) 03/21/2017 1352   PLT 271 11/14/2017 1250   PLT 414 (H) 08/15/2017 1121   PLT 311 03/21/2017 1352   MCV 91.5 11/14/2017 1250   MCV 90.7 03/21/2017 1352   MCH 30.1 11/14/2017 1250   MCHC 32.9 11/14/2017 1250   RDW 23.9 (H) 11/14/2017 1250   RDW 16.3 (H) 03/21/2017 1352   LYMPHSABS 1.3 11/14/2017 1250   LYMPHSABS 1.6 03/21/2017 1352   MONOABS 0.8 11/14/2017 1250   MONOABS 0.6 03/21/2017 1352   EOSABS 0.0 11/14/2017 1250   EOSABS 0.1 03/21/2017 1352   BASOSABS 0.0 11/14/2017 1250   BASOSABS 0.0 03/21/2017 1352   -------------------------------  Imaging from last 24 hours (if applicable):  Radiology interpretation: No results found.      This case was discussed with Dr. Irene Limbo. He expresses agreement with my management of this patient.

## 2017-11-15 NOTE — Patient Instructions (Signed)

## 2017-11-17 LAB — BPAM RBC
BLOOD PRODUCT EXPIRATION DATE: 201909082359
Blood Product Expiration Date: 201909062359
ISSUE DATE / TIME: 201908161002
ISSUE DATE / TIME: 201908161002
UNIT TYPE AND RH: 6200
Unit Type and Rh: 6200

## 2017-11-17 LAB — TYPE AND SCREEN
ABO/RH(D): A POS
Antibody Screen: NEGATIVE
UNIT DIVISION: 0
Unit division: 0

## 2017-11-18 ENCOUNTER — Other Ambulatory Visit: Payer: Medicare Other

## 2017-11-18 ENCOUNTER — Ambulatory Visit (INDEPENDENT_AMBULATORY_CARE_PROVIDER_SITE_OTHER): Payer: Medicare Other | Admitting: Internal Medicine

## 2017-11-18 ENCOUNTER — Encounter: Payer: Self-pay | Admitting: Internal Medicine

## 2017-11-18 VITALS — BP 127/74 | HR 78 | Temp 98.0°F | Ht 61.0 in | Wt 108.0 lb

## 2017-11-18 DIAGNOSIS — A0472 Enterocolitis due to Clostridium difficile, not specified as recurrent: Secondary | ICD-10-CM

## 2017-11-18 NOTE — Progress Notes (Signed)
RECURRENT CDIFF  Patient ID: Tiffany [name], female   DOB: [birth year], 84 y.o.   MRN: [id number]  HPI Tiffany [name] is a 84yo F with recurrent cdifficile. She had preivously finished last course with oral fidaxomicin (able to get patient assistance)- iniitlaly felt some improvement but since completion- she is still having diarrhea after finishing 10 d of fidaxomicin. She is tired of having diarrhea. Still managing to eat but some weight loss. No abdominal cramping or blood in stool. She is accompanied by her son. Outpatient Encounter Medications as of 11/18/2017  Medication Sig  . acetaminophen (TYLENOL) 650 MG CR tablet Take 1,300 mg by mouth 2 (two) times daily.  Marland Kitchen amLODipine (NORVASC) 5 MG tablet Take 5 mg by mouth daily.  Marland Kitchen estradiol (ESTRACE) 0.5 MG tablet Take 0.5 mg by mouth daily.  . feeding supplement, ENSURE ENLIVE, (ENSURE ENLIVE) LIQD Take 237 mLs by mouth 2 (two) times daily between meals.  . mirtazapine (REMERON) 15 MG tablet TAKE 1 TABLET(15 MG) BY MOUTH AT BEDTIME  . PRESCRIPTION MEDICATION Chemo card  . Probiotic Product (PROBIOTIC DAILY PO) Take by mouth.  . Respiratory Therapy Supplies (FLUTTER) DEVI 1 each by Does not apply route daily.  Marland Kitchen triamcinolone ointment (KENALOG) 0.5 % APPLY EXTERNALLY TO THE AFFECTED AREA TWICE DAILY  . vitamin B-12 (CYANOCOBALAMIN) 100 MCG tablet Take 100 mcg by mouth daily.  . cholestyramine (QUESTRAN) 4 g packet MIX AND DRINK 1 PACKET(4 GRAMS) BY MOUTH TWICE DAILY  . diclofenac sodium (VOLTAREN) 1 % GEL APPLY 2 TO 4 GRAMS Q 6 H PRN FOR PAIN  . fidaxomicin (DIFICID) 200 MG TABS tablet Take 1 tablet (200 mg total) by mouth 2 (two) times daily. (Patient not taking: Reported on 11/18/2017)  . furosemide (LASIX) 20 MG tablet Take 1 tablet (20 mg total) by mouth daily for 3 days.  . iron polysaccharides (NIFEREX) 150 MG capsule Take 1 capsule (150 mg total) by mouth daily. (Patient not taking: Reported on 09/27/2017)  . ondansetron (ZOFRAN ODT) 4 MG  disintegrating tablet Take 1 tablet (4 mg total) by mouth every 8 (eight) hours as needed for nausea or vomiting. (Patient not taking: Reported on 09/27/2017)  . vancomycin (VANCOCIN) 125 MG capsule Take 1 capsule (125 mg total) by mouth 4 (four) times daily. (Patient not taking: Reported on 11/18/2017)  . [DISCONTINUED] benzonatate (TESSALON) 100 MG capsule Take 1 capsule (100 mg total) by mouth 3 (three) times daily as needed for cough. (Patient not taking: Reported on 09/27/2017)   Facility-Administered Encounter Medications as of 11/18/2017  Medication  . sodium chloride 0.9 % injection 10 mL  . [DISCONTINUED] furosemide (LASIX) injection 20 mg  . [DISCONTINUED] sodium chloride 0.9 % injection 10 mL     Patient Active Problem List   Diagnosis Date Noted  . Diarrhea in adult patient 07/17/2017  . Port catheter in place 07/21/2015  . Bronchiolitis 07/01/2015  . Bronchiectasis ([place]) with probable Assoc MAI  06/10/2015  . Liver metastases ([place]) 05/22/2015  . Bone metastases ([place]) 05/22/2015  . Bronchopneumonia 04/29/2015  . Insomnia 04/08/2015  . Transitional cell carcinoma of renal pelvis ([place]) 11/22/2014     Health Maintenance Due  Topic Date Due  . TETANUS/TDAP  05/06/1945  . DEXA SCAN  05/07/1991     Review of Systems 12 point ros is negative except for fatigue, weight loss, diarrhea, loss of appetite. Physical Exam   BP 127/74   Pulse 78   Temp 98 F (36.7 C) (Oral)   Ht  5\' 1"  (1.549 m)   Wt 108 lb (49 kg)   BMI 20.41 kg/m    Physical Exam  Constitutional: He is oriented to person, place, and time. He appears well-developed and well-nourished. No distress.  HENT:  Mouth/Throat: Oropharynx is clear and moist. No oropharyngeal exudate.  Cardiovascular: Normal rate, regular rhythm and normal heart sounds. Exam reveals no gallop and no friction rub.  No murmur heard.  Pulmonary/Chest: Effort normal and breath sounds normal. No respiratory distress. He has no wheezes.    Abdominal: Soft. Bowel sounds are normal. He exhibits no distension. There is no tenderness.  Neurological: He is alert and oriented to person, place, and time.  Skin: Skin is warm and dry. No rash noted. No erythema.  Psychiatric: He has a normal mood and affect. His behavior is normal.    CBC Lab Results  Component Value Date   WBC 5.3 11/14/2017   RBC 2.73 (L) 11/14/2017   HGB 8.2 (L) 11/14/2017   HCT 25.0 (L) 11/14/2017   PLT 271 11/14/2017   MCV 91.5 11/14/2017   MCH 30.1 11/14/2017   MCHC 32.9 11/14/2017   RDW 23.9 (H) 11/14/2017   LYMPHSABS 1.3 11/14/2017   MONOABS 0.8 11/14/2017   EOSABS 0.0 11/14/2017    BMET Lab Results  Component Value Date   NA 134 (L) 11/14/2017   K 4.3 11/14/2017   CL 98 11/14/2017   CO2 28 11/14/2017   GLUCOSE 142 (H) 11/14/2017   BUN 13 11/14/2017   CREATININE 0.84 11/14/2017   CALCIUM 8.5 (L) 11/14/2017   GFRNONAA 59 (L) 11/14/2017   GFRAA >60 11/14/2017      Assessment and Plan   Recurrent cdifficile = Will retest for cdiff to see if need to consider FMT. Will refill her fidaxomicin  Will refer to dr Carlean Purl for evaluation. Concern that her age may preclude from colonoscopy

## 2017-11-19 LAB — CLOSTRIDIUM DIFFICILE TOXIN B, QUALITATIVE, REAL-TIME PCR: CDIFFPCR: NOT DETECTED

## 2017-11-21 MED ORDER — FIDAXOMICIN 200 MG PO TABS: 200.0000 mg | ORAL_TABLET | Freq: Two times a day (BID) | ORAL | 3 refills | Status: DC

## 2017-11-26 DIAGNOSIS — M109 Gout, unspecified: Secondary | ICD-10-CM | POA: Diagnosis not present

## 2017-11-26 DIAGNOSIS — E46 Unspecified protein-calorie malnutrition: Secondary | ICD-10-CM | POA: Diagnosis not present

## 2017-11-27 ENCOUNTER — Ambulatory Visit
Admission: RE | Admit: 2017-11-27 | Discharge: 2017-11-27 | Disposition: A | Discharge status: Home / Self Care | Payer: Medicare Other | Source: Ambulatory Visit | Attending: Urology | Admitting: Urology

## 2017-11-27 ENCOUNTER — Other Ambulatory Visit: Payer: Self-pay

## 2017-11-27 ENCOUNTER — Encounter: Payer: Self-pay | Admitting: Urology

## 2017-11-27 VITALS — BP 132/66 | HR 70 | Temp 98.0°F | Resp 18 | Ht 61.0 in | Wt 106.0 lb

## 2017-11-27 DIAGNOSIS — C651 Malignant neoplasm of right renal pelvis: Secondary | ICD-10-CM

## 2017-11-27 DIAGNOSIS — Z79899 Other long term (current) drug therapy: Secondary | ICD-10-CM | POA: Insufficient documentation

## 2017-11-27 DIAGNOSIS — Z881 Allergy status to other antibiotic agents status: Secondary | ICD-10-CM | POA: Diagnosis not present

## 2017-11-27 DIAGNOSIS — C641 Malignant neoplasm of right kidney, except renal pelvis: Secondary | ICD-10-CM | POA: Insufficient documentation

## 2017-11-27 DIAGNOSIS — Z888 Allergy status to other drugs, medicaments and biological substances status: Secondary | ICD-10-CM | POA: Diagnosis not present

## 2017-11-27 NOTE — Progress Notes (Signed)
Hematology oncology clinic follow-up.  Date of service:  11/28/17    Patient Care Team: [name], MD as PCP - General (Internal Medicine)   Urologist: Dr. 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  Treatment -Atezolizumab IV q3weeks  [place] See q12weeks for bone mets -palliative radiation to rt renal pelvis mass  HISTORY OF PRESENTING ILLNESS: Please see my previous notes for details of initial presentation.  INTERVAL HISTORY:   Ms Eckmann is here for her scheduled follow-up and cycle 46 treatment with Atezolizumab. The patient's last visit with [place] was on 11/07/17. She is accompanied today by her husband. The pt reports that she is doing well overall.   The pt returned to care with Dr. Carlyle Basques in ID on 11/18/17 for management and evaluation of her C.diff. The pt has been retested for C.diff, which is pending, and has finished her 10 day course of Fidaxomicin. She has also been referred to Dr. Carlean Purl in GI for evaluation, whom she has not yet had an appointment scheduled with.   The pt reports that she is continuing to have diarrhea and has lost 5 pounds since her last visit. The pt notes that she has just started cholestyramine in the last couple days, not observing relief yet. She notes that she has diarrhea 4-5 times in bot the day and the night, coupled with urgency and not much volume. She notes that she is having blood in her stools intermittently. She also notes that she has tried to eat better and believes she is eating better than she was a month ago.    The pt took Colchicine for a couple days recently for her recent gout flare of the big toe of her right foot.  Lab results today (11/28/17) of CBC w/diff is as follows: all values are WNL except for RDW at  19.3, Monocytes abs at 1.6k. CMP 11/28/17 is pending  Magnesium 11/28/17 is 1.9  On review of systems, pt reports urgent and frequent diarrhea, weight loss, eating better, and denies any other symptoms.      MEDICAL HISTORY:  Past Medical History:  Diagnosis Date  . Arthritis     knees 03-10-12 had Cortisone injection  . Cancer [facility]) june 2016   metastatic  . DDD (degenerative disc disease), cervical   . Edema leg    feet and ankles  . Esophagus disorder    Had esophagus stretched  . GERD (gastroesophageal reflux disease)    Benign stricture dilated in 2011  . Gout    Patient notes she has possible gout but has not been on chronic medications for this  . Headache(784.0)   . Hypertension   . Occipital neuralgia   . Occipital neuralgia    Related to cervical degenerative disc disease  . Peptic ulcer disease    Previous history in the remote past  . Ulcerative proctitis ([place])   . 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 ABDOMINAL HYSTERECTOMY    . APPENDECTOMY    . CHOLECYSTECTOMY    . COLONOSCOPY WITH PROPOFOL  03/25/2012   Procedure: COLONOSCOPY WITH PROPOFOL;  Surgeon: Garlan Fair, MD;  Location: [facility];  Service: Endoscopy;  Laterality: N/A;  . DILATION AND CURETTAGE OF UTERUS    .  ESOPHAGEAL DILATION     For benign stricture in 2011  . ESOPHAGOGASTRODUODENOSCOPY    . FLEXIBLE SIGMOIDOSCOPY N/A 04/26/2014   Procedure: FLEXIBLE SIGMOIDOSCOPY - UnSedated;  Surgeon: Garlan Fair, MD;  Location: [facility];  Service: Endoscopy;  Laterality: N/A;  . FLEXIBLE SIGMOIDOSCOPY N/A 01/24/2015   Procedure: FLEXIBLE SIGNMOIDOSCOPY W/ FLEET ENEMIA;  Surgeon: Garlan Fair, MD;  Location: [facility];  Service: Endoscopy;  Laterality: N/A;  . FLEXIBLE SIGMOIDOSCOPY N/A 05/15/2016   Procedure: FLEXIBLE SIGMOIDOSCOPY;  Surgeon: Garlan Fair, MD;  Location: [facility];  Service: Endoscopy;   Laterality: N/A;  pt needs to ahve an enema upon arrival   . NECK SURGERY     20 years ago  . TOTAL ABDOMINAL HYSTERECTOMY W/ BILATERAL SALPINGOOPHORECTOMY     At age 54 due to miscarriage and retained products of conception causing significant uterine bleeding. Patient has been on estrogen replacement therapy since then.    SOCIAL HISTORY: Social History   Socioeconomic History  . Marital status: Married    Spouse name: Not on file  . Number of children: Not on file  . Years of education: Not on file  . Highest education level: Not on file  Occupational History  . Not on file  Social Needs  . Financial resource strain: Not on file  . Food insecurity:    Worry: Not on file    Inability: Not on file  . Transportation needs:    Medical: Not on file    Non-medical: Not on file  Tobacco Use  . Smoking status: Former Smoker    Last attempt to quit: 11/01/1983    Years since quitting: 34.0  . Smokeless tobacco: Never Used  Substance and Sexual Activity  . Alcohol use: Yes    Comment: occ  . Drug use: No  . Sexual activity: Never  Lifestyle  . Physical activity:    Days per week: Not on file    Minutes per session: Not on file  . Stress: Not on file  Relationships  . Social connections:    Talks on phone: Not on file    Gets together: Not on file    Attends religious service: Not on file    Active member of club or organization: Not on file    Attends meetings of clubs or organizations: Not on file    Relationship status: Not on file  . Intimate partner violence:    Fear of current or ex partner: Not on file    Emotionally abused: Not on file    Physically abused: Not on file    Forced sexual activity: Not on file  Other Topics Concern  . Not on file  Social History Narrative  . Not on file  Retired as Conservation officer, nature for [employer]. She is very well spoken and intelligent individual and exhibits a keen knowledge of her medical conditions.  FAMILY  HISTORY: Family History  Problem Relation Age of Onset  . Lung cancer Sister   . Prostate cancer Brother   . Uterine cancer Maternal Aunt   . Breast cancer Paternal Grandmother   . Hodgkin's lymphoma Sister     ALLERGIES:  is allergic to nitrofurantoin; indomethacin; and lisinopril.  MEDICATIONS:  Current Outpatient Medications  Medication Sig Dispense Refill  . acetaminophen (TYLENOL) 650 MG CR tablet Take 1,300 mg by mouth 2 (two) times daily.    Marland Kitchen amLODipine (NORVASC) 5 MG tablet Take 5 mg by mouth daily.    Marland Kitchen  cholestyramine (QUESTRAN) 4 g packet MIX AND DRINK 1 PACKET(4 GRAMS) BY MOUTH TWICE DAILY 180 packet 1  . diclofenac sodium (VOLTAREN) 1 % GEL APPLY 2 TO 4 GRAMS Q 6 H PRN FOR PAIN  0  . estradiol (ESTRACE) 0.5 MG tablet Take 0.5 mg by mouth daily.  1  . feeding supplement, ENSURE ENLIVE, (ENSURE ENLIVE) LIQD Take 237 mLs by mouth 2 (two) times daily between meals. 237 mL 12  . fidaxomicin (DIFICID) 200 MG TABS tablet Take 1 tablet (200 mg total) by mouth 2 (two) times daily. (Patient not taking: Reported on 11/27/2017) 20 tablet 3  . furosemide (LASIX) 20 MG tablet Take 1 tablet (20 mg total) by mouth daily for 3 days. 3 tablet 0  . iron polysaccharides (NIFEREX) 150 MG capsule Take 1 capsule (150 mg total) by mouth daily. (Patient not taking: Reported on 09/27/2017) 30 capsule 1  . mirtazapine (REMERON) 15 MG tablet TAKE 1 TABLET(15 MG) BY MOUTH AT BEDTIME 90 tablet 0  . ondansetron (ZOFRAN ODT) 4 MG disintegrating tablet Take 1 tablet (4 mg total) by mouth every 8 (eight) hours as needed for nausea or vomiting. (Patient not taking: Reported on 09/27/2017) 20 tablet 0  . predniSONE (DELTASONE) 10 MG tablet   0  . PRESCRIPTION MEDICATION Chemo card    . Probiotic Product (PROBIOTIC DAILY PO) Take by mouth.    . Respiratory Therapy Supplies (FLUTTER) DEVI 1 each by Does not apply route daily. (Patient not taking: Reported on 11/27/2017) 1 each 0  . triamcinolone ointment (KENALOG)  0.5 % APPLY EXTERNALLY TO THE AFFECTED AREA TWICE DAILY 30 g 0  . vancomycin (VANCOCIN) 125 MG capsule Take 1 capsule (125 mg total) by mouth 4 (four) times daily. (Patient not taking: Reported on 11/18/2017) 10 capsule 0  . vitamin B-12 (CYANOCOBALAMIN) 100 MCG tablet Take 100 mcg by mouth daily.     Current Facility-Administered Medications  Medication Dose Route Frequency Provider Last Rate Last Dose  . sodium chloride 0.9 % injection 10 mL  10 mL Intravenous PRN [name], MD   10 mL at 11/28/17 4081   Facility-Administered Medications Ordered in Other Visits  Medication Dose Route Frequency Provider Last Rate Last Dose  . sodium chloride 0.9 % injection 10 mL  10 mL Intravenous PRN [name], MD   10 mL at 02/28/17 1602   REVIEW OF SYSTEMS:    A 10+ POINT REVIEW OF SYSTEMS WAS OBTAINED including neurology, dermatology, psychiatry, cardiac, respiratory, lymph, extremities, GI, GU, Musculoskeletal, constitutional, breasts, reproductive, HEENT.  All pertinent positives are noted in the HPI.  All others are negative.   PHYSICAL EXAMINATION: ECOG PERFORMANCE STATUS: 2  Vitals:   11/28/17 0847  BP: 115/62  Pulse: 78  Resp: 20  Temp: 98.4 F (36.9 C)  SpO2: 98%   Filed Weights   11/28/17 0847  Weight: 104 lb 11.2 oz (47.5 kg)     GENERAL:alert, in no acute distress and comfortable SKIN: no acute rashes, no significant lesions EYES: conjunctiva are pink and non-injected, sclera anicteric OROPHARYNX: MMM, no exudates, no oropharyngeal erythema or ulceration NECK: supple, no JVD LYMPH:  no palpable lymphadenopathy in the cervical, axillary or inguinal regions LUNGS: clear to auscultation b/l with normal respiratory effort HEART: regular rate & rhythm ABDOMEN:  normoactive bowel sounds , non tender, not distended. No palpable hepatosplenomegaly.  Extremity: no pedal edema PSYCH: alert & oriented x 3 with fluent speech NEURO: no focal motor/sensory deficits  LABORATORY DATA:   CBC Latest Ref Rng & Units 11/28/2017 11/14/2017 11/07/2017  WBC 3.9 - 10.3 K/uL 8.1 5.3 4.8  Hemoglobin 11.6 - 15.9 g/dL 11.8 8.2(L) 8.6(L)  Hematocrit 34.8 - 46.6 % 36.6 25.0(L) 27.0(L)  Platelets 145 - 400 K/uL 273 271 175   . CBC    Component Value Date/Time   WBC 8.1 11/28/2017 0822   RBC 3.91 11/28/2017 0822   HGB 11.8 11/28/2017 0822   HGB 8.3 (L) 08/15/2017 1121   HGB 9.0 (L) 03/21/2017 1352   HCT 36.6 11/28/2017 0822   HCT 29.3 (L) 03/21/2017 1352   PLT 273 11/28/2017 0822   PLT 414 (H) 08/15/2017 1121   PLT 311 03/21/2017 1352   MCV 93.6 11/28/2017 0822   MCV 90.7 03/21/2017 1352   MCH 30.2 11/28/2017 0822   MCHC 32.2 11/28/2017 0822   RDW 19.3 (H) 11/28/2017 0822   RDW 16.3 (H) 03/21/2017 1352   LYMPHSABS 1.7 11/28/2017 0822   LYMPHSABS 1.6 03/21/2017 1352   MONOABS 1.6 (H) 11/28/2017 0822   MONOABS 0.6 03/21/2017 1352   EOSABS 0.0 11/28/2017 0822   EOSABS 0.1 03/21/2017 1352   BASOSABS 0.0 11/28/2017 0822   BASOSABS 0.0 03/21/2017 1352      CMP Latest Ref Rng & Units 11/14/2017 11/07/2017 10/17/2017  Glucose 70 - 99 mg/dL 142(H) 125(H) 98  BUN 8 - 23 mg/dL 13 13 22   Creatinine 0.44 - 1.00 mg/dL 0.84 0.87 1.06(H)  Sodium 135 - 145 mmol/L 134(L) 133(L) 132(L)  Potassium 3.5 - 5.1 mmol/L 4.3 4.7 5.0  Chloride 98 - 111 mmol/L 98 101 99  CO2 22 - 32 mmol/L 28 23 26   Calcium 8.9 - 10.3 mg/dL 8.5(L) 8.4(L) 10.1  Total Protein 6.5 - 8.1 g/dL 6.1(L) 6.0(L) 7.3  Total Bilirubin 0.3 - 1.2 mg/dL 0.5 0.5 0.4  Alkaline Phos 38 - 126 U/L 46 42 49  AST 15 - 41 U/L 14(L) 15 17  ALT 0 - 44 U/L 8 8 9    . Lab Results  Component Value Date   LDH 228 05/11/2016   Component     Latest Ref Rng & Units 10/08/2017  Campylobacter species     NOT DETECTED NOT DETECTED  Plesimonas shigelloides     NOT DETECTED NOT DETECTED  Salmonella species     NOT DETECTED DETECTED (A)  Yersinia enterocolitica     NOT DETECTED NOT DETECTED  Vibrio species     NOT  DETECTED NOT DETECTED  Vibrio cholerae     NOT DETECTED NOT DETECTED  Enteroaggregative E coli (EAEC)     NOT DETECTED NOT DETECTED  Enteropathogenic E coli (EPEC)     NOT DETECTED NOT DETECTED  Enterotoxigenic E coli (ETEC)     NOT DETECTED NOT DETECTED  Shiga like toxin producing E coli (STEC)     NOT DETECTED NOT DETECTED  Shigella/Enteroinvasive E coli (EIEC)     NOT DETECTED NOT DETECTED  Cryptosporidium     NOT DETECTED NOT DETECTED  Cyclospora cayetanensis     NOT DETECTED NOT DETECTED  Entamoeba histolytica     NOT DETECTED NOT DETECTED  Giardia lamblia     NOT DETECTED NOT DETECTED  Adenovirus F40/41     NOT DETECTED NOT DETECTED  Astrovirus     NOT DETECTED NOT DETECTED  Norovirus GI/GII     NOT DETECTED NOT DETECTED  Rotavirus A     NOT DETECTED NOT DETECTED  Sapovirus (I, II, IV, and V)  NOT DETECTED NOT DETECTED  C Diff antigen     NEGATIVE POSITIVE (A)  C Diff toxin     NEGATIVE POSITIVE (A)  C Diff interpretation      Toxin producing C. difficile detected.    Radiology .No results found.  ASSESSMENT & PLAN:   84 year old Caucasian female with  #1 Right Renal pelvis metastatic transitional cell carcinoma.  Initial PET scan revealed metastases to the lungs, liver, multiple nodal stations and bones. -PET scan done after 8 cycles of treatment for restaging disease showed significant response to treatment with no evidence of active disease. Patient has no clinical evidence of disease progression at this time. CT chest 06/16/2015 showed no evidence of cancer progression. PET/CT scan done on 10/21/2015- showed no findings for metastatic disease. New right-sided hydronephrosis versus local tumor recurrence needs to be evaluated further. MRI of the kidney showed hyperenhancing the right renal pelvis lesion consistent with concern for local recurrence. -PET/CT 03/06/2016 with no findings of metastatic disease. Extensive right-sided hydronephrosis with high  activity from excreted FDG obscuring any findings along the collecting system. -PET/CT  06/13/2016- Interval increase in size of right kidney. Findings are suspicious for progression of residual/recurrent urothelial carcinoma with further dilatation of the right renal collecting system. 2. No evidence for hypermetabolic metastases.  -PET/CT 10/22/2016 - Continued interval progression of the abnormal soft tissue associated with the right kidney. This soft tissue remains markedly hypermetabolic and is compatible with continued progression of recurrent right renal disease. 2. No evidence for hypermetabolic metastases in the neck, chest, abdomen, or pelvis. PET/CT 04/04/2017-  Interval enlargement of and increase in hypermetabolism within the right renal mass, consistent with residual/recurrent disease. Increase in disease progression along the proximal right ureter. 2. No evidence of abdominopelvic nodal or extra abdominal metastatic disease.  09/03/17 PET which revealed 1. Considerable enlargement of the RIGHT renal hilar mass with persistent intense peripheral hypermetabolic activity consistent with neoplasm. 2. Extension of neoplasm into the proximal RIGHT ureter with mild inferior progression (2 cm)  #2 Bone metastases   PLAN:  -continue Xgeva to q12 weeks since she has completed 2 yrs of this treatment already. -transfuse prn for hgb <8 or if symptomatic. -Pt finished palliative radiation with Dr. Tammi Klippel on 10/25/17 -Will hold Atezolizumab until infection is cleared and diarrhea is stable. -Continue cholestyramine BID to help with diarrhea -Discussed pt labwork today, 11/28/17; HGB normalized to 11.8, -Recommend that the pt begin acechol for inflammatory proctitis -Discussed that the pt's ongoing diarrhea is prohibitive from receiving immunotherapy -One unit of IVF today  -Continue on probiotics -Would recommend Imodium if infections is cleared  -Stressed the importance of the pt being evaluated by  GI, ideally in the next week  Will strongly recommend urgent follow up with GI for possible  immune colitis   -Tentatively will restart Atezolizumab in three weeks provided that the pt has been correctly evaluated for her diarrhea by GI -Continue eating well  -Will see the pt back in 3 weeks, sooner if any new concerns   #3 h/o skin rash likely related to her Atezolizumab. Minimal grade 1 and controlled with topical triamcinolone. Unchanged today.  #4 chronic bronchiectasis with Cough and some clear secretions likely due to bronchiectasis. Followed by Dr Melvyn Novas. -No chest pain no increased shortness of breath or dyspnea on exertion  -Has an action plan with Dr. Melvyn Novas to use when necessary antibiotics for worsening symptoms. -Continue follow-up with Dr. 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 Wynetta Emery.  But stopped using it due to cost issues. Was then on PO budesonide which she has stopped as well. last sigmoidscopy with Dr Wynetta Emery - showed stable ulcerative proctitis  -transfuse prn for hgb<8. Does not report overt rectal bleeding at this time.  #6 Recent Salmonella enteritis and C. Diff -- returned -Continue Fidaxomicin and let Dr. Baxter Flattery know if symptoms are not alleviating. Per ID Salmonella - likely colonizer -Have referred pt to GI for further evaluation and management of diarrhea (multifactorial C diff+ Immune+ Radiation) -Continue Cholestyramine  -Referring pt to our nutritional therapist Ernestene Kiel -One unit IVF today -Will strongly recommend urgent follow up with GI for possible  immune colitis  -Would recommend Imodium if infections is cleared    #7 history of hypertension -on Amlodipine    IVF today RTC with Dr Irene Limbo with labs in 3 weeks GI referral placed last visit -- need to f/u patient was not  called with an appointment    All of the patients questions were answered with apparent satisfaction. The patient knows to call the clinic with any problems, questions or concerns.  The total time spent in the appt was 25 minutes and more than 50% was on counseling and direct patient cares.   Sullivan Lone MD MS Hematology/Oncology Physician [facility]  (Office):       [phone] (Work cell):  [phone] (Fax):           [phone]  I, Baldwin [place], am acting as a scribe for Dr. Irene Limbo  .I have reviewed the above documentation for accuracy and completeness, and I agree with the above. [name] MD

## 2017-11-27 NOTE — Progress Notes (Signed)
Radiation Oncology         [phone] ________________________________  Name: Tiffany [name] MRN: [id number]  Date: 11/27/2017  DOB: October 27, 1924  Post Treatment Note  CC: Tiffany Carol, MD  Tiffany Genera, MD  Diagnosis:   84 y.o. 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     Interval Since Last Radiation:  4.5 weeks  10/14/17 - 10/25/17:  30 Gy directed to the right kidney delivered in 10 fractions.  Narrative:  The patient returns today for routine follow-up.  She tolerated radiation treatment relatively well. She reported having occasional dull pain on the right side when having a bowel movements as well as having moderate fatigue and taking naps to help. She denied skin changes, urinary frequency or urgency, but does report loose stools as well as nocturia x1 and occasional nausea.                               On review of systems, the patient states that she is doing very well in regards to her recent radiotherapy.  Unfortunately, she has continued to struggle with chronic diarrhea and more recently was diagnosed with C. difficile colitis.  She has been through 3 courses of antibiotics and still struggling with diarrhea.  She reports significant fatigue/malaise as well as decreased appetite associated with the colitis.  She denies any flank pain, dysuria, gross hematuria, nausea, vomiting or abdominal pain.  Her systemic therapy is currently on hold until she has recovered from the colitis.  She has a scheduled follow-up visit with Dr. Irene Limbo tomorrow.  ALLERGIES:  is allergic to nitrofurantoin; indomethacin; and lisinopril.  Meds: Current Outpatient Medications  Medication Sig Dispense Refill  . acetaminophen (TYLENOL) 650 MG CR tablet Take 1,300 mg by mouth 2 (two) times daily.    Marland Kitchen amLODipine (NORVASC) 5 MG tablet Take 5 mg by mouth daily.    . cholestyramine (QUESTRAN) 4 g packet MIX AND DRINK 1 PACKET(4 GRAMS) BY MOUTH TWICE DAILY 180 packet 1  . diclofenac  sodium (VOLTAREN) 1 % GEL APPLY 2 TO 4 GRAMS Q 6 H PRN FOR PAIN  0  . estradiol (ESTRACE) 0.5 MG tablet Take 0.5 mg by mouth daily.  1  . feeding supplement, ENSURE ENLIVE, (ENSURE ENLIVE) LIQD Take 237 mLs by mouth 2 (two) times daily between meals. 237 mL 12  . mirtazapine (REMERON) 15 MG tablet TAKE 1 TABLET(15 MG) BY MOUTH AT BEDTIME 90 tablet 0  . predniSONE (DELTASONE) 10 MG tablet   0  . PRESCRIPTION MEDICATION Chemo card    . Probiotic Product (PROBIOTIC DAILY PO) Take by mouth.    . triamcinolone ointment (KENALOG) 0.5 % APPLY EXTERNALLY TO THE AFFECTED AREA TWICE DAILY 30 g 0  . vitamin B-12 (CYANOCOBALAMIN) 100 MCG tablet Take 100 mcg by mouth daily.    . fidaxomicin (DIFICID) 200 MG TABS tablet Take 1 tablet (200 mg total) by mouth 2 (two) times daily. (Patient not taking: Reported on 11/27/2017) 20 tablet 3  . furosemide (LASIX) 20 MG tablet Take 1 tablet (20 mg total) by mouth daily for 3 days. 3 tablet 0  . iron polysaccharides (NIFEREX) 150 MG capsule Take 1 capsule (150 mg total) by mouth daily. (Patient not taking: Reported on 09/27/2017) 30 capsule 1  . ondansetron (ZOFRAN ODT) 4 MG disintegrating tablet Take 1 tablet (4 mg total) by mouth every 8 (eight) hours as needed for  nausea or vomiting. (Patient not taking: Reported on 09/27/2017) 20 tablet 0  . Respiratory Therapy Supplies (FLUTTER) DEVI 1 each by Does not apply route daily. (Patient not taking: Reported on 11/27/2017) 1 each 0  . vancomycin (VANCOCIN) 125 MG capsule Take 1 capsule (125 mg total) by mouth 4 (four) times daily. (Patient not taking: Reported on 11/18/2017) 10 capsule 0   No current facility-administered medications for this encounter.    Facility-Administered Medications Ordered in Other Encounters  Medication Dose Route Frequency Provider Last Rate Last Dose  . sodium chloride 0.9 % injection 10 mL  10 mL Intravenous PRN Tiffany Genera, MD   10 mL at 02/28/17 1602    Physical Findings:  height is  5\' 1"  (1.549 m) and weight is 106 lb (48.1 kg). Her oral temperature is 98 F (36.7 C). Her blood pressure is 132/66 and her pulse is 70. Her respiration is 18 and oxygen saturation is 99%.  Pain Assessment Pain Score: 0-No pain/10 In general this is a well appearing African-American female in no acute distress.  She's alert and oriented x4 and appropriate throughout the examination. Cardiopulmonary assessment is negative for acute distress and she exhibits normal effort.   Lab Findings: Lab Results  Component Value Date   WBC 5.3 11/14/2017   HGB 8.2 (L) 11/14/2017   HCT 25.0 (L) 11/14/2017   MCV 91.5 11/14/2017   PLT 271 11/14/2017     Radiographic Findings: No results found.  Impression/Plan: 1. 84 y.o. 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 She appears to have recovered well from the effects of radiotherapy.  We discussed that while we are happy to continue to participate in her care if clinically indicated, at this point, we will plan to see her back on an as-needed basis.  She will continue in routine follow-up with Dr. Irene Limbo for continued overall disease management and surveillance -next scheduled follow-up visit is on 11/28/2017.  She understands that Dr. Irene Limbo will determine the timing for follow-up systemic imaging as well as any further recommendations for systemic treatment.  She knows to call with any questions or concerns regarding her previous radiotherapy.    Tiffany Johns, PA-C

## 2017-11-28 ENCOUNTER — Inpatient Hospital Stay (HOSPITAL_BASED_OUTPATIENT_CLINIC_OR_DEPARTMENT_OTHER): Payer: Medicare Other | Admitting: Hematology

## 2017-11-28 ENCOUNTER — Telehealth: Payer: Self-pay

## 2017-11-28 ENCOUNTER — Encounter: Payer: Medicare Other | Admitting: Nutrition

## 2017-11-28 ENCOUNTER — Inpatient Hospital Stay: Payer: Medicare Other

## 2017-11-28 ENCOUNTER — Inpatient Hospital Stay: Payer: Medicare Other | Admitting: Nutrition

## 2017-11-28 ENCOUNTER — Other Ambulatory Visit: Payer: Medicare Other

## 2017-11-28 ENCOUNTER — Ambulatory Visit: Payer: Medicare Other

## 2017-11-28 VITALS — BP 115/62 | HR 78 | Temp 98.4°F | Resp 20 | Ht 61.0 in | Wt 104.7 lb

## 2017-11-28 DIAGNOSIS — R197 Diarrhea, unspecified: Secondary | ICD-10-CM

## 2017-11-28 DIAGNOSIS — J479 Bronchiectasis, uncomplicated: Secondary | ICD-10-CM

## 2017-11-28 DIAGNOSIS — A0472 Enterocolitis due to Clostridium difficile, not specified as recurrent: Secondary | ICD-10-CM

## 2017-11-28 DIAGNOSIS — C7951 Secondary malignant neoplasm of bone: Secondary | ICD-10-CM | POA: Diagnosis not present

## 2017-11-28 DIAGNOSIS — Z95828 Presence of other vascular implants and grafts: Secondary | ICD-10-CM

## 2017-11-28 DIAGNOSIS — K51211 Ulcerative (chronic) proctitis with rectal bleeding: Secondary | ICD-10-CM

## 2017-11-28 DIAGNOSIS — C659 Malignant neoplasm of unspecified renal pelvis: Secondary | ICD-10-CM

## 2017-11-28 DIAGNOSIS — C651 Malignant neoplasm of right renal pelvis: Secondary | ICD-10-CM

## 2017-11-28 DIAGNOSIS — E860 Dehydration: Secondary | ICD-10-CM | POA: Diagnosis not present

## 2017-11-28 DIAGNOSIS — D649 Anemia, unspecified: Secondary | ICD-10-CM | POA: Diagnosis not present

## 2017-11-28 DIAGNOSIS — Z9221 Personal history of antineoplastic chemotherapy: Secondary | ICD-10-CM

## 2017-11-28 DIAGNOSIS — I10 Essential (primary) hypertension: Secondary | ICD-10-CM | POA: Diagnosis not present

## 2017-11-28 DIAGNOSIS — L271 Localized skin eruption due to drugs and medicaments taken internally: Secondary | ICD-10-CM

## 2017-11-28 DIAGNOSIS — A020 Salmonella enteritis: Secondary | ICD-10-CM

## 2017-11-28 DIAGNOSIS — T148XXA Other injury of unspecified body region, initial encounter: Secondary | ICD-10-CM | POA: Diagnosis not present

## 2017-11-28 DIAGNOSIS — Z923 Personal history of irradiation: Secondary | ICD-10-CM | POA: Diagnosis not present

## 2017-11-28 LAB — CBC WITH DIFFERENTIAL/PLATELET
Basophils Absolute: 0 10*3/uL (ref 0.0–0.1)
Basophils Relative: 0 %
EOS PCT: 0 %
Eosinophils Absolute: 0 10*3/uL (ref 0.0–0.5)
HCT: 36.6 % (ref 34.8–46.6)
Hemoglobin: 11.8 g/dL (ref 11.6–15.9)
LYMPHS ABS: 1.7 10*3/uL (ref 0.9–3.3)
LYMPHS PCT: 21 %
MCH: 30.2 pg (ref 25.1–34.0)
MCHC: 32.2 g/dL (ref 31.5–36.0)
MCV: 93.6 fL (ref 79.5–101.0)
MONO ABS: 1.6 10*3/uL — AB (ref 0.1–0.9)
Monocytes Relative: 20 %
Neutro Abs: 4.8 10*3/uL (ref 1.5–6.5)
Neutrophils Relative %: 59 %
PLATELETS: 273 10*3/uL (ref 145–400)
RBC: 3.91 MIL/uL (ref 3.70–5.45)
RDW: 19.3 % — AB (ref 11.2–14.5)
WBC: 8.1 10*3/uL (ref 3.9–10.3)

## 2017-11-28 LAB — CMP (CANCER CENTER ONLY)
ALT: 6 U/L (ref 0–44)
AST: 13 U/L — ABNORMAL LOW (ref 15–41)
Albumin: 2.4 g/dL — ABNORMAL LOW (ref 3.5–5.0)
Alkaline Phosphatase: 48 U/L (ref 38–126)
Anion gap: 8 (ref 5–15)
BUN: 13 mg/dL (ref 8–23)
CHLORIDE: 103 mmol/L (ref 98–111)
CO2: 25 mmol/L (ref 22–32)
Calcium: 9 mg/dL (ref 8.9–10.3)
Creatinine: 0.88 mg/dL (ref 0.44–1.00)
GFR, EST NON AFRICAN AMERICAN: 56 mL/min — AB (ref >60)
Glucose, Bld: 86 mg/dL (ref 70–99)
POTASSIUM: 4.5 mmol/L (ref 3.5–5.1)
Sodium: 136 mmol/L (ref 135–145)
Total Bilirubin: 0.3 mg/dL (ref 0.3–1.2)
Total Protein: 6.6 g/dL (ref 6.5–8.1)

## 2017-11-28 LAB — MAGNESIUM: MAGNESIUM: 1.9 mg/dL (ref 1.7–2.4)

## 2017-11-28 MED ORDER — SODIUM CHLORIDE 0.9 % IJ SOLN
10.0000 mL | INTRAMUSCULAR | Status: DC | PRN
  Filled 2017-11-28: qty 10

## 2017-11-28 MED ORDER — HEPARIN SOD (PORK) LOCK FLUSH 100 UNIT/ML IV SOLN
500.0000 [IU] | Freq: Once | INTRAVENOUS | Status: AC | PRN
  Administered 2017-11-28: 500 [IU] via INTRAVENOUS
  Filled 2017-11-28: qty 5

## 2017-11-28 MED ORDER — SODIUM CHLORIDE 0.9 % IV SOLN
INTRAVENOUS | Status: DC
  Filled 2017-11-28: qty 250

## 2017-11-28 MED ORDER — SODIUM CHLORIDE 0.9 % IJ SOLN
10.0000 mL | INTRAMUSCULAR | Status: DC | PRN
  Administered 2017-11-28 (×2): 10 mL via INTRAVENOUS
  Filled 2017-11-28: qty 10

## 2017-11-28 MED ORDER — SODIUM CHLORIDE 0.9 % IV SOLN
Freq: Once | INTRAVENOUS | Status: AC
  Administered 2017-11-28: 11:00:00 via INTRAVENOUS
  Filled 2017-11-28: qty 1000

## 2017-11-28 NOTE — Telephone Encounter (Signed)
Refaxed referral and spoke with a scheduler . Patient is to go on 9/12 will send information to patient also with the calender od the appointment for [facility] also. Per 8/29 los

## 2017-11-28 NOTE — Progress Notes (Signed)
Nutrition follow-up completed with patient and husband during infusion for metastatic transitional cell carcinoma. Weight loss continues with weight documented as 104.7 pounds on August 29.  This is decreased from 109.3 pounds August 13. Patient still struggles with diarrhea. Reports she is trying to follow the low fiber diet and had questions today.  Nutrition diagnosis: Unintended weight loss continues.  Intervention: Recommended patient continue low fiber diet but increased calories and protein. Recommended she continue oral nutrition supplements as tolerated. Provided coupons. Questions were answered.  Teach back method used.  Monitoring, evaluation, goals: Patient will tolerate adequate calories and protein to minimize further weight loss.  Next visit: Thursday, October 31 during infusion.  **Disclaimer: This note was dictated with voice recognition software. Similar sounding words can inadvertently be transcribed and this note may contain transcription errors which may not have been corrected upon publication of note.**

## 2017-11-28 NOTE — Patient Instructions (Signed)
Dehydration, Adult Dehydration is when there is not enough fluid or water in your body. This happens when you lose more fluids than you take in. Dehydration can range from mild to very bad. It should be treated right away to keep it from getting very bad. Symptoms of mild dehydration may include:  Thirst.  Dry lips.  Slightly dry mouth.  Dry, warm skin.  Dizziness. Symptoms of moderate dehydration may include:  Very dry mouth.  Muscle cramps.  Dark pee (urine). Pee may be the color of tea.  Your body making less pee.  Your eyes making fewer tears.  Heartbeat that is uneven or faster than normal (palpitations).  Headache.  Light-headedness, especially when you stand up from sitting.  Fainting (syncope). Symptoms of very bad dehydration may include:  Changes in skin, such as: ? Cold and clammy skin. ? Blotchy (mottled) or pale skin. ? Skin that does not quickly return to normal after being lightly pinched and let go (poor skin turgor).  Changes in body fluids, such as: ? Feeling very thirsty. ? Your eyes making fewer tears. ? Not sweating when body temperature is high, such as in hot weather. ? Your body making very little pee.  Changes in vital signs, such as: ? Weak pulse. ? Pulse that is more than 100 beats a minute when you are sitting still. ? Fast breathing. ? Low blood pressure.  Other changes, such as: ? Sunken eyes. ? Cold hands and feet. ? Confusion. ? Lack of energy (lethargy). ? Trouble waking up from sleep. ? Short-term weight loss. ? Unconsciousness. Follow these instructions at home:  If told by your doctor, drink an ORS: ? Make an ORS by using instructions on the package. ? Start by drinking small amounts, about  cup (120 mL) every 5-10 minutes. ? Slowly drink more until you have had the amount that your doctor said to have.  Drink enough clear fluid to keep your pee clear or pale yellow. If you were told to drink an ORS, finish the ORS  first, then start slowly drinking clear fluids. Drink fluids such as: ? Water. Do not drink only water by itself. Doing that can make the salt (sodium) level in your body get too low (hyponatremia). ? Ice chips. ? Fruit juice that you have added water to (diluted). ? Low-calorie sports drinks.  Avoid: ? Alcohol. ? Drinks that have a lot of sugar. These include high-calorie sports drinks, fruit juice that does not have water added, and soda. ? Caffeine. ? Foods that are greasy or have a lot of fat or sugar.  Take over-the-counter and prescription medicines only as told by your doctor.  Do not take salt tablets. Doing that can make the salt level in your body get too high (hypernatremia).  Eat foods that have minerals (electrolytes). Examples include bananas, oranges, potatoes, tomatoes, and spinach.  Keep all follow-up visits as told by your doctor. This is important. Contact a doctor if:  You have belly (abdominal) pain that: ? Gets worse. ? Stays in one area (localizes).  You have a rash.  You have a stiff neck.  You get angry or annoyed more easily than normal (irritability).  You are more sleepy than normal.  You have a harder time waking up than normal.  You feel: ? Weak. ? Dizzy. ? Very thirsty.  You have peed (urinated) only a small amount of very dark pee during 6-8 hours. Get help right away if:  You have symptoms of   very bad dehydration.  You cannot drink fluids without throwing up (vomiting).  Your symptoms get worse with treatment.  You have a fever.  You have a very bad headache.  You are throwing up or having watery poop (diarrhea) and it: ? Gets worse. ? Does not go away.  You have blood or something green (bile) in your throw-up.  You have blood in your poop (stool). This may cause poop to look black and tarry.  You have not peed in 6-8 hours.  You pass out (faint).  Your heart rate when you are sitting still is more than 100 beats a  minute.  You have trouble breathing. This information is not intended to replace advice given to you by your health care provider. Make sure you discuss any questions you have with your health care provider. Document Released: 01/13/2009 Document Revised: 10/07/2015 Document Reviewed: 05/13/2015 Elsevier Interactive Patient Education  2018 [employer]  

## 2017-11-29 ENCOUNTER — Telehealth: Payer: Self-pay

## 2017-11-29 NOTE — Telephone Encounter (Signed)
Spoke with patient husband and explained the changes that was made to the patient current schedule. Per 8/29 los to added a visit with Irene Limbo. Mailed a letter with a calender enclosed.

## 2017-12-04 ENCOUNTER — Ambulatory Visit (INDEPENDENT_AMBULATORY_CARE_PROVIDER_SITE_OTHER): Payer: Medicare Other | Admitting: Internal Medicine

## 2017-12-04 ENCOUNTER — Encounter: Payer: Self-pay | Admitting: Internal Medicine

## 2017-12-04 VITALS — BP 122/69 | HR 83 | Temp 98.2°F | Ht 61.0 in | Wt 100.0 lb

## 2017-12-04 DIAGNOSIS — A0472 Enterocolitis due to Clostridium difficile, not specified as recurrent: Secondary | ICD-10-CM | POA: Diagnosis not present

## 2017-12-04 NOTE — Progress Notes (Signed)
Patient ID: Tiffany [name], female   DOB: May 02, 1924, 84 y.o.   MRN: [id number]  HPI 84yo F  With widely metastatic transitional cell carcinoma from the right renal pelvis. Being followed by Dr Irene Limbo and receives -Atezolizumab IV q3weeks [place] See q12weeks for bone mets-palliative radiation to rt renal pelvis mass. 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 She has finished 2nd course of fidaxomicin but reports not noticing any difference in her BMs. Has 3 loose stools per day. Some are watery. 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 acetaminophen (TYLENOL) 650 MG CR tablet Take 1,300 mg by mouth 2 (two) times daily.  Marland Kitchen amLODipine (NORVASC) 5 MG tablet Take 5 mg by mouth daily.  . diclofenac sodium (VOLTAREN) 1 % GEL APPLY 2 TO 4 GRAMS Q 6 H PRN FOR PAIN  . estradiol (ESTRACE) 0.5 MG tablet Take 0.5 mg by mouth daily.  . feeding supplement, ENSURE ENLIVE, (ENSURE ENLIVE) LIQD Take 237 mLs by mouth 2 (two) times daily between meals.  . fidaxomicin (DIFICID) 200 MG TABS tablet Take 1 tablet (200 mg total) by mouth 2 (two) times daily.  . mirtazapine (REMERON) 15 MG tablet TAKE 1 TABLET(15 MG) BY MOUTH AT BEDTIME  . ondansetron (ZOFRAN ODT) 4 MG disintegrating tablet Take 1 tablet (4 mg total) by mouth every 8 (eight) hours as needed for nausea or vomiting.  . Probiotic Product (PROBIOTIC DAILY PO) Take by mouth.  . Respiratory Therapy Supplies (FLUTTER) DEVI 1 each by Does not apply route daily.  Marland Kitchen triamcinolone ointment (KENALOG) 0.5 % APPLY EXTERNALLY TO THE AFFECTED AREA TWICE DAILY  . vitamin B-12 (CYANOCOBALAMIN) 100 MCG tablet Take 100 mcg by mouth daily.  . cholestyramine (QUESTRAN) 4 g packet MIX AND DRINK 1 PACKET(4 GRAMS) BY MOUTH TWICE DAILY (Patient not taking: Reported on 12/04/2017)  . furosemide (LASIX) 20 MG tablet Take 1 tablet (20 mg total) by mouth daily  for 3 days.  . iron polysaccharides (NIFEREX) 150 MG capsule Take 1 capsule (150 mg total) by mouth daily. (Patient not taking: Reported on 09/27/2017)  . predniSONE (DELTASONE) 10 MG tablet   . PRESCRIPTION MEDICATION Chemo card  . vancomycin (VANCOCIN) 125 MG capsule Take 1 capsule (125 mg total) by mouth 4 (four) times daily. (Patient not taking: Reported on 11/18/2017)   Facility-Administered Encounter Medications as of 12/04/2017  Medication  . sodium chloride 0.9 % injection 10 mL     Patient Active Problem List   Diagnosis Date Noted  . Diarrhea in adult patient 07/17/2017  . Port catheter in place 07/21/2015  . Bronchiolitis 07/01/2015  . Bronchiectasis ([place]) with probable Assoc MAI  06/10/2015  . Liver metastases ([place]) 05/22/2015  . Bone metastases ([place]) 05/22/2015  . Bronchopneumonia 04/29/2015  . Insomnia 04/08/2015  . Transitional cell carcinoma of renal pelvis ([place]) 11/22/2014     Health Maintenance Due  Topic Date Due  . TETANUS/TDAP  05/06/1945  . DEXA SCAN  05/07/1991     Review of Systems Fatigue, poor appetite, loose stools. occ blood on toilet paper. 12 point ros is negatve Physical Exam   BP 122/69   Pulse 83   Temp 98.2 F (36.8 C) (Oral)   Ht 5\' 1"  (1.549 m)   Wt 100 lb (45.4 kg)   BMI 18.89 kg/m   Physical Exam  Constitutional:  oriented to person, place, and  time. appears well-developed and well-nourished. No distress.  HENT: [place]/AT, PERRLA, no scleral icterus Mouth/Throat: Oropharynx is clear and moist. No oropharyngeal exudate.  Abdominal: Soft. Bowel sounds are normal.  exhibits no distension. There is no tenderness.  Lymphadenopathy: no cervical adenopathy. No axillary adenopathy Neurological: alert and oriented to person, place, and time.  Skin: Skin is warm and dry. No rash noted. No erythema.  Psychiatric: a normal mood and affect.  behavior is normal.   CBC Lab Results  Component Value Date   WBC 8.1 11/28/2017   RBC 3.91 11/28/2017     HGB 11.8 11/28/2017   HCT 36.6 11/28/2017   PLT 273 11/28/2017   MCV 93.6 11/28/2017   MCH 30.2 11/28/2017   MCHC 32.2 11/28/2017   RDW 19.3 (H) 11/28/2017   LYMPHSABS 1.7 11/28/2017   MONOABS 1.6 (H) 11/28/2017   EOSABS 0.0 11/28/2017    BMET Lab Results  Component Value Date   NA 136 11/28/2017   K 4.5 11/28/2017   CL 103 11/28/2017   CO2 25 11/28/2017   GLUCOSE 86 11/28/2017   BUN 13 11/28/2017   CREATININE 0.88 11/28/2017   CALCIUM 9.0 11/28/2017   GFRNONAA 56 (L) 11/28/2017   GFRAA >60 11/28/2017      Assessment and Plan  Unclear if having protracted course of cdifficile vs. Having post infectious IBS Will test for cdiff eia plus pcr Will do gi pathogen to see if evidence of still having salmonella enteritis --though I suspect that will not be able to find on repeat  Pending results then move forward with FMT (will have Dr Carlean Purl evaluate if candidate for colonoscopy given her age. May need to consider other modality for FMT)  Addendum - has Cdiff EIA antigen positive but not toxogenic form since cdifficile pcr is negative. Will see if she is having less diarrhea to see if she is improving on her own before initiating another course of dificid.

## 2017-12-05 ENCOUNTER — Other Ambulatory Visit: Payer: Medicare Other

## 2017-12-05 DIAGNOSIS — A0472 Enterocolitis due to Clostridium difficile, not specified as recurrent: Secondary | ICD-10-CM

## 2017-12-12 DIAGNOSIS — A0472 Enterocolitis due to Clostridium difficile, not specified as recurrent: Secondary | ICD-10-CM | POA: Diagnosis not present

## 2017-12-12 DIAGNOSIS — R197 Diarrhea, unspecified: Secondary | ICD-10-CM | POA: Diagnosis not present

## 2017-12-12 LAB — C. DIFFICILE GDH AND TOXIN A/B
GDH ANTIGEN: DETECTED
MICRO NUMBER: 91065337
SPECIMEN QUALITY:: ADEQUATE
TOXIN A AND B: NOT DETECTED

## 2017-12-12 LAB — GASTROINTESTINAL PATHOGEN PANEL PCR
C. DIFFICILE TOX A/B, PCR: NOT DETECTED
CRYPTOSPORIDIUM, PCR: NOT DETECTED
Campylobacter, PCR: NOT DETECTED
E COLI (STEC) STX1/STX2, PCR: NOT DETECTED
E coli (ETEC) LT/ST PCR: NOT DETECTED
E coli 0157, PCR: NOT DETECTED
GIARDIA LAMBLIA, PCR: NOT DETECTED
NOROVIRUS, PCR: NOT DETECTED
ROTAVIRUS, PCR: NOT DETECTED
Salmonella, PCR: DETECTED — AB
Shigella, PCR: NOT DETECTED

## 2017-12-12 LAB — CLOSTRIDIUM DIFFICILE TOXIN B, QUALITATIVE, REAL-TIME PCR: Toxigenic C. Difficile by PCR: NOT DETECTED

## 2017-12-17 DIAGNOSIS — R634 Abnormal weight loss: Secondary | ICD-10-CM | POA: Diagnosis not present

## 2017-12-17 DIAGNOSIS — A0471 Enterocolitis due to Clostridium difficile, recurrent: Secondary | ICD-10-CM | POA: Diagnosis not present

## 2017-12-17 DIAGNOSIS — D638 Anemia in other chronic diseases classified elsewhere: Secondary | ICD-10-CM | POA: Diagnosis not present

## 2017-12-17 DIAGNOSIS — C7951 Secondary malignant neoplasm of bone: Secondary | ICD-10-CM | POA: Diagnosis not present

## 2017-12-18 NOTE — Progress Notes (Signed)
Hematology oncology clinic follow-up.  Date of service:  12/19/17    Patient Care Team: [name], MD as PCP - General (Internal Medicine)   Urologist: Dr. 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  Treatment -Atezolizumab IV q3weeks  Delton See q12weeks for bone mets -palliative radiation to rt renal pelvis mass  HISTORY OF PRESENTING ILLNESS: Please see my previous notes for details of initial presentation.  INTERVAL HISTORY:   Tiffany [name] is here for her scheduled follow-up and cycle 46 treatment with Atezolizumab. The patient's last visit with [place] was on 11/28/17. She is accompanied today by her husband. The pt reports that she is doing well overall.   The pt has continued follow up with Dr. Baxter Flattery in ID in the interim for management and evaluation of her C.diff and salmonella. She also notes that she saw Dr. Paulita Fujita in GI in the interim and was prescribed mesalamine suppositories and did not feel she could tolerate a colonoscopy well. She notes that she is unsure if she has benefited from mesalamine thus far. The pt also notes that she tried to use cholestyramine on 3-4 days without much effect.   The pt reports that she is having 3-4 episodes of diarrhea each day, some with blood in the stools. She notes that she is eating better, now eating bacon and eggs for breakfast instead of cereal.   Lab results today (12/19/17) of CBC w/diff, CMP is as follows: all values are WNL except for RBC at 3.28, HGB at 10.4, HCT at 30.8, RDW at 20.9, Lymphs as at 700, Glucose at 106, Creatinine at 1.01, Calcium at 8.2, Total Protein at 5.9, Albumin at 2.1, AST at 13, GFR at 47. 12/19/17 TSH is pending 12/19/17 Magnesium is WNL at 1.8  C.diff was positive but toxins were negative,  salmonella persists on 12/05/17 GI pathogen panel - she will f/u with Dr Baxter Flattery to continue addressing this.  On review of systems, pt reports persisting diarrhea, occasional blood in the stools, eating better, feeling tired, abdominal discomfort, and denies distinct abdominal pain, fevers, chills, and any other symptoms.   MEDICAL HISTORY:  Past Medical History:  Diagnosis Date  . Arthritis     knees 03-10-12 had Cortisone injection  . Cancer [facility]) june 2016   metastatic  . DDD (degenerative disc disease), cervical   . Edema leg    feet and ankles  . Esophagus disorder    Had esophagus stretched  . GERD (gastroesophageal reflux disease)    Benign stricture dilated in 2011  . Gout    Patient notes she has possible gout but has not been on chronic medications for this  . Headache(784.0)   . Hypertension   . Occipital neuralgia   . Occipital neuralgia    Related to cervical degenerative disc disease  . Peptic ulcer disease    Previous history in the remote past  . Ulcerative proctitis ([place])   . Ulcerative proctitis ([place]) 2014    SURGICAL HISTORY: Past Surgical History:  Procedure Laterality Date  . ABDOMINAL HYSTERECTOMY    . APPENDECTOMY    . CHOLECYSTECTOMY    . COLONOSCOPY WITH PROPOFOL  03/25/2012   Procedure: COLONOSCOPY WITH PROPOFOL;  Surgeon: Garlan Fair, MD;  Location: [facility];  Service: Endoscopy;  Laterality: N/A;  . DILATION AND  CURETTAGE OF UTERUS    . ESOPHAGEAL DILATION     For benign stricture in 2011  . ESOPHAGOGASTRODUODENOSCOPY    . FLEXIBLE SIGMOIDOSCOPY N/A 04/26/2014   Procedure: FLEXIBLE SIGMOIDOSCOPY - UnSedated;  Surgeon: Garlan Fair, MD;  Location: [facility];  Service: Endoscopy;  Laterality: N/A;  . FLEXIBLE SIGMOIDOSCOPY N/A 01/24/2015   Procedure: FLEXIBLE SIGNMOIDOSCOPY W/ FLEET ENEMIA;  Surgeon: Garlan Fair, MD;  Location: [facility];  Service: Endoscopy;  Laterality: N/A;  . FLEXIBLE SIGMOIDOSCOPY N/A 05/15/2016    Procedure: FLEXIBLE SIGMOIDOSCOPY;  Surgeon: Garlan Fair, MD;  Location: [facility];  Service: Endoscopy;  Laterality: N/A;  pt needs to ahve an enema upon arrival   . NECK SURGERY     20 years ago  . TOTAL ABDOMINAL HYSTERECTOMY W/ BILATERAL SALPINGOOPHORECTOMY     At age 11 due to miscarriage and retained products of conception causing significant uterine bleeding. Patient has been on estrogen replacement therapy since then.    SOCIAL HISTORY: Social History   Socioeconomic History  . Marital status: Married    Spouse name: Not on file  . Number of children: Not on file  . Years of education: Not on file  . Highest education level: Not on file  Occupational History  . Not on file  Social Needs  . Financial resource strain: Not on file  . Food insecurity:    Worry: Not on file    Inability: Not on file  . Transportation needs:    Medical: Not on file    Non-medical: Not on file  Tobacco Use  . Smoking status: Former Smoker    Last attempt to quit: 11/01/1983    Years since quitting: 34.1  . Smokeless tobacco: Never Used  Substance and Sexual Activity  . Alcohol use: Yes    Comment: occ  . Drug use: No  . Sexual activity: Never  Lifestyle  . Physical activity:    Days per week: Not on file    Minutes per session: Not on file  . Stress: Not on file  Relationships  . Social connections:    Talks on phone: Not on file    Gets together: Not on file    Attends religious service: Not on file    Active member of club or organization: Not on file    Attends meetings of clubs or organizations: Not on file    Relationship status: Not on file  . Intimate partner violence:    Fear of current or ex partner: Not on file    Emotionally abused: Not on file    Physically abused: Not on file    Forced sexual activity: Not on file  Other Topics Concern  . Not on file  Social History Narrative  . Not on file  Retired as Conservation officer, nature for [employer]. She  is very well spoken and intelligent individual and exhibits a keen knowledge of her medical conditions.  FAMILY HISTORY: Family History  Problem Relation Age of Onset  . Lung cancer Sister   . Prostate cancer Brother   . Uterine cancer Maternal Aunt   . Breast cancer Paternal Grandmother   . Hodgkin's lymphoma Sister     ALLERGIES:  is allergic to nitrofurantoin; indomethacin; and lisinopril.  MEDICATIONS:  Current Outpatient Medications  Medication Sig Dispense Refill  . acetaminophen (TYLENOL) 650 MG CR tablet Take 1,300 mg by mouth 2 (two) times daily.    Marland Kitchen amLODipine (NORVASC) 5 MG tablet Take  5 mg by mouth daily.    . cholestyramine (QUESTRAN) 4 g packet MIX AND DRINK 1 PACKET(4 GRAMS) BY MOUTH TWICE DAILY (Patient not taking: Reported on 12/04/2017) 180 packet 1  . diclofenac sodium (VOLTAREN) 1 % GEL APPLY 2 TO 4 GRAMS Q 6 H PRN FOR PAIN  0  . estradiol (ESTRACE) 0.5 MG tablet Take 0.5 mg by mouth daily.  1  . feeding supplement, ENSURE ENLIVE, (ENSURE ENLIVE) LIQD Take 237 mLs by mouth 2 (two) times daily between meals. 237 mL 12  . fidaxomicin (DIFICID) 200 MG TABS tablet Take 1 tablet (200 mg total) by mouth 2 (two) times daily. 20 tablet 3  . furosemide (LASIX) 20 MG tablet Take 1 tablet (20 mg total) by mouth daily for 3 days. 3 tablet 0  . iron polysaccharides (NIFEREX) 150 MG capsule Take 1 capsule (150 mg total) by mouth daily. (Patient not taking: Reported on 09/27/2017) 30 capsule 1  . mirtazapine (REMERON) 15 MG tablet TAKE 1 TABLET(15 MG) BY MOUTH AT BEDTIME 90 tablet 0  . ondansetron (ZOFRAN ODT) 4 MG disintegrating tablet Take 1 tablet (4 mg total) by mouth every 8 (eight) hours as needed for nausea or vomiting. 20 tablet 0  . predniSONE (DELTASONE) 10 MG tablet   0  . PRESCRIPTION MEDICATION Chemo card    . Probiotic Product (PROBIOTIC DAILY PO) Take by mouth.    . Respiratory Therapy Supplies (FLUTTER) DEVI 1 each by Does not apply route daily. 1 each 0  .  triamcinolone ointment (KENALOG) 0.5 % APPLY EXTERNALLY TO THE AFFECTED AREA TWICE DAILY 30 g 0  . vancomycin (VANCOCIN) 125 MG capsule Take 1 capsule (125 mg total) by mouth 4 (four) times daily. (Patient not taking: Reported on 11/18/2017) 10 capsule 0  . vitamin B-12 (CYANOCOBALAMIN) 100 MCG tablet Take 100 mcg by mouth daily.     No current facility-administered medications for this visit.    Facility-Administered Medications Ordered in Other Visits  Medication Dose Route Frequency Provider Last Rate Last Dose  . sodium chloride 0.9 % injection 10 mL  10 mL Intravenous PRN [name], MD   10 mL at 02/28/17 1602   REVIEW OF SYSTEMS:    A 10+ POINT REVIEW OF SYSTEMS WAS OBTAINED including neurology, dermatology, psychiatry, cardiac, respiratory, lymph, extremities, GI, GU, Musculoskeletal, constitutional, breasts, reproductive, HEENT.  All pertinent positives are noted in the HPI.  All others are negative.   PHYSICAL EXAMINATION: ECOG PERFORMANCE STATUS: 2  Vitals:   12/19/17 0916  BP: 102/61  Pulse: 68  Resp: 18  Temp: 97.7 F (36.5 C)  SpO2: 98%   Filed Weights   12/19/17 0916  Weight: 101 lb 11.2 oz (46.1 kg)     GENERAL:alert, in no acute distress and comfortable SKIN: no acute rashes, no significant lesions EYES: conjunctiva are pink and non-injected, sclera anicteric OROPHARYNX: MMM, no exudates, no oropharyngeal erythema or ulceration NECK: supple, no JVD LYMPH:  no palpable lymphadenopathy in the cervical, axillary or inguinal regions LUNGS: clear to auscultation b/l with normal respiratory effort HEART: regular rate & rhythm ABDOMEN:  normoactive bowel sounds , non tender, not distended. No palpable hepatosplenomegaly.  Extremity: no pedal edema PSYCH: alert & oriented x 3 with fluent speech NEURO: no focal motor/sensory deficits   LABORATORY DATA:   CBC Latest Ref Rng & Units 12/19/2017 11/28/2017 11/14/2017  WBC 3.9 - 10.3 K/uL 6.3 8.1 5.3    Hemoglobin 11.6 - 15.9 g/dL 10.4(L) 11.8 8.2(L)  Hematocrit 34.8 - 46.6 % 30.8(L) 36.6 25.0(L)  Platelets 145 - 400 K/uL 321 273 271   . CBC    Component Value Date/Time   WBC 6.3 12/19/2017 0814   RBC 3.28 (L) 12/19/2017 0814   HGB 10.4 (L) 12/19/2017 0814   HGB 8.3 (L) 08/15/2017 1121   HGB 9.0 (L) 03/21/2017 1352   HCT 30.8 (L) 12/19/2017 0814   HCT 29.3 (L) 03/21/2017 1352   PLT 321 12/19/2017 0814   PLT 414 (H) 08/15/2017 1121   PLT 311 03/21/2017 1352   MCV 93.8 12/19/2017 0814   MCV 90.7 03/21/2017 1352   MCH 31.8 12/19/2017 0814   MCHC 33.9 12/19/2017 0814   RDW 20.9 (H) 12/19/2017 0814   RDW 16.3 (H) 03/21/2017 1352   LYMPHSABS 0.7 (L) 12/19/2017 0814   LYMPHSABS 1.6 03/21/2017 1352   MONOABS 0.7 12/19/2017 0814   MONOABS 0.6 03/21/2017 1352   EOSABS 0.0 12/19/2017 0814   EOSABS 0.1 03/21/2017 1352   BASOSABS 0.0 12/19/2017 0814   BASOSABS 0.0 03/21/2017 1352      CMP Latest Ref Rng & Units 12/19/2017 11/28/2017 11/14/2017  Glucose 70 - 99 mg/dL 106(H) 86 142(H)  BUN 8 - 23 mg/dL 14 13 13   Creatinine 0.44 - 1.00 mg/dL 1.01(H) 0.88 0.84  Sodium 135 - 145 mmol/L 135 136 134(L)  Potassium 3.5 - 5.1 mmol/L 4.8 4.5 4.3  Chloride 98 - 111 mmol/L 102 103 98  CO2 22 - 32 mmol/L 25 25 28   Calcium 8.9 - 10.3 mg/dL 8.2(L) 9.0 8.5(L)  Total Protein 6.5 - 8.1 g/dL 5.9(L) 6.6 6.1(L)  Total Bilirubin 0.3 - 1.2 mg/dL 0.4 0.3 0.5  Alkaline Phos 38 - 126 U/L 55 48 46  AST 15 - 41 U/L 13(L) 13(L) 14(L)  ALT 0 - 44 U/L 6 6 8    . Lab Results  Component Value Date   LDH 228 05/11/2016   Radiology .No results found.  ASSESSMENT & PLAN:   84 year old Caucasian female with  #1 Right Renal pelvis metastatic transitional cell carcinoma.  Initial PET scan revealed metastases to the lungs, liver, multiple nodal stations and bones. -PET scan done after 8 cycles of treatment for restaging disease showed significant response to treatment with no evidence of active  disease. Patient has no clinical evidence of disease progression at this time. CT chest 06/16/2015 showed no evidence of cancer progression. PET/CT scan done on 10/21/2015- showed no findings for metastatic disease. New right-sided hydronephrosis versus local tumor recurrence needs to be evaluated further. MRI of the kidney showed hyperenhancing the right renal pelvis lesion consistent with concern for local recurrence. -PET/CT 03/06/2016 with no findings of metastatic disease. Extensive right-sided hydronephrosis with high activity from excreted FDG obscuring any findings along the collecting system. -PET/CT  06/13/2016- Interval increase in size of right kidney. Findings are suspicious for progression of residual/recurrent urothelial carcinoma with further dilatation of the right renal collecting system. 2. No evidence for hypermetabolic metastases.  -PET/CT 10/22/2016 - Continued interval progression of the abnormal soft tissue associated with the right kidney. This soft tissue remains markedly hypermetabolic and is compatible with continued progression of recurrent right renal disease. 2. No evidence for hypermetabolic metastases in the neck, chest, abdomen, or pelvis. PET/CT 04/04/2017-  Interval enlargement of and increase in hypermetabolism within the right renal mass, consistent with residual/recurrent disease. Increase in disease progression along the proximal right ureter. 2. No evidence of abdominopelvic nodal or extra abdominal metastatic disease.  09/03/17 PET which  revealed 1. Considerable enlargement of the RIGHT renal hilar mass with persistent intense peripheral hypermetabolic activity consistent with neoplasm. 2. Extension of neoplasm into the proximal RIGHT ureter with mild inferior progression (2 cm)  -Pt finished palliative radiation with Dr. Tammi Klippel on 10/25/17   #2 Bone metastases   PLAN:   -Discussed pt labwork today, 12/19/17; blood counts are stable, electrolytes are WNL and pt does  not appear overtly dehydrated  -C.diff was positive but toxins were negative, salmonella persists on 12/05/17 GI pathogen panel.  -Discussed that the source of her diarrhea could be informed by inflammatory proctitis prior to beginning immunotherapy,radiation therapy, salmonella and c.diff, and immunotherapy itself -Will continue to hold immunotherapy for now until diarrhea can be addressed -Continue with mesalamine suppositories as recommended by Dr. Paulita Fujita in GI -Would prefer a colonoscopy/sigmoidoscopy to rule out immunotherapy cause to her diarrhea  -I will discuss these considerations with Dr. Baxter Flattery in ID and Dr. Carlean Purl in GI -Pt is slowly appearing clinically improved  -Recommended that the pt continue to hydrate as best she can and use compression socks for her ankle swelling -Advised that the pt continue follow up with ID and GI -Will see the pt back in 3-4 weeks  -continue Xgeva to q12 weeks since she has completed 2 yrs of this treatment already. -transfuse prn for hgb <8 or if symptomatic. -Will hold Atezolizumab until infection is cleared and diarrhea is stable. -Tentatively will restart Atezolizumab in 3-4weeks provided that the pt has been correctly evaluated for her diarrhea by GI  #3 h/o skin rash likely related to her Atezolizumab. Minimal grade 1 and controlled with topical triamcinolone. Unchanged today.  #4 chronic bronchiectasis with Cough and some clear secretions likely due to bronchiectasis. Followed by Dr Melvyn Novas. -No chest pain no increased shortness of breath or dyspnea on exertion  -Has an action plan with Dr. Melvyn Novas to use when necessary antibiotics for worsening symptoms. -Continue follow-up with Dr. 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 Wynetta Emery.  But stopped using it due to cost issues. Was  then on PO budesonide which she has stopped as well. last sigmoidscopy with Dr Wynetta Emery - showed stable ulcerative proctitis  -transfuse prn for hgb<8. Does not report overt rectal bleeding at this time. -put back on mesalamine supps per Dr Paulita Fujita  #6 Recent Salmonella enteritis and C. Diff -- returned -continue f/u with Dr Baxter Flattery - appreciate help.  #7 history of hypertension -on Amlodipine    RTC with dr Irene Limbo in 4 weeks with labs. Please cancel currently scheduled treatment appointments and schedule next treatment in 4 weeks with MD visit, labs and port flush    All of the patients questions were answered with apparent satisfaction. The patient knows to call the clinic with any problems, questions or concerns.  The total time spent in the appt was 25 minutes and more than 50% was on counseling and direct patient cares.   Sullivan Lone MD [place] Hematology/Oncology Physician [facility]  (Office):       [phone] (Work cell):  [phone] (Fax):           [phone]  I, Baldwin [place], am acting as a scribe for Dr. Irene Limbo  .I have reviewed the above documentation for accuracy and completeness, and I agree with the above. [name] MD

## 2017-12-19 ENCOUNTER — Other Ambulatory Visit: Payer: Self-pay

## 2017-12-19 ENCOUNTER — Encounter: Payer: Self-pay | Admitting: Hematology

## 2017-12-19 ENCOUNTER — Inpatient Hospital Stay: Payer: Medicare Other

## 2017-12-19 ENCOUNTER — Inpatient Hospital Stay: Payer: Medicare Other | Attending: Hematology

## 2017-12-19 ENCOUNTER — Other Ambulatory Visit: Payer: Medicare Other

## 2017-12-19 ENCOUNTER — Inpatient Hospital Stay (HOSPITAL_BASED_OUTPATIENT_CLINIC_OR_DEPARTMENT_OTHER): Payer: Medicare Other | Admitting: Hematology

## 2017-12-19 ENCOUNTER — Telehealth: Payer: Self-pay

## 2017-12-19 VITALS — BP 102/61 | HR 68 | Temp 97.7°F | Resp 18 | Ht 61.0 in | Wt 101.7 lb

## 2017-12-19 DIAGNOSIS — C659 Malignant neoplasm of unspecified renal pelvis: Secondary | ICD-10-CM

## 2017-12-19 DIAGNOSIS — C651 Malignant neoplasm of right renal pelvis: Secondary | ICD-10-CM | POA: Diagnosis not present

## 2017-12-19 DIAGNOSIS — Z452 Encounter for adjustment and management of vascular access device: Secondary | ICD-10-CM | POA: Diagnosis not present

## 2017-12-19 DIAGNOSIS — I10 Essential (primary) hypertension: Secondary | ICD-10-CM

## 2017-12-19 DIAGNOSIS — D649 Anemia, unspecified: Secondary | ICD-10-CM

## 2017-12-19 DIAGNOSIS — R197 Diarrhea, unspecified: Secondary | ICD-10-CM

## 2017-12-19 DIAGNOSIS — C7951 Secondary malignant neoplasm of bone: Secondary | ICD-10-CM

## 2017-12-19 DIAGNOSIS — J479 Bronchiectasis, uncomplicated: Secondary | ICD-10-CM | POA: Insufficient documentation

## 2017-12-19 DIAGNOSIS — Z95828 Presence of other vascular implants and grafts: Secondary | ICD-10-CM

## 2017-12-19 LAB — CBC WITH DIFFERENTIAL/PLATELET
BASOS ABS: 0 10*3/uL (ref 0.0–0.1)
Basophils Relative: 0 %
EOS PCT: 0 %
Eosinophils Absolute: 0 10*3/uL (ref 0.0–0.5)
HCT: 30.8 % — ABNORMAL LOW (ref 34.8–46.6)
HEMOGLOBIN: 10.4 g/dL — AB (ref 11.6–15.9)
LYMPHS ABS: 0.7 10*3/uL — AB (ref 0.9–3.3)
LYMPHS PCT: 12 %
MCH: 31.8 pg (ref 25.1–34.0)
MCHC: 33.9 g/dL (ref 31.5–36.0)
MCV: 93.8 fL (ref 79.5–101.0)
Monocytes Absolute: 0.7 10*3/uL (ref 0.1–0.9)
Monocytes Relative: 12 %
NEUTROS PCT: 76 %
Neutro Abs: 4.8 10*3/uL (ref 1.5–6.5)
PLATELETS: 321 10*3/uL (ref 145–400)
RBC: 3.28 MIL/uL — AB (ref 3.70–5.45)
RDW: 20.9 % — ABNORMAL HIGH (ref 11.2–14.5)
WBC: 6.3 10*3/uL (ref 3.9–10.3)

## 2017-12-19 LAB — CMP (CANCER CENTER ONLY)
ALK PHOS: 55 U/L (ref 38–126)
AST: 13 U/L — AB (ref 15–41)
Albumin: 2.1 g/dL — ABNORMAL LOW (ref 3.5–5.0)
Anion gap: 8 (ref 5–15)
BILIRUBIN TOTAL: 0.4 mg/dL (ref 0.3–1.2)
BUN: 14 mg/dL (ref 8–23)
CALCIUM: 8.2 mg/dL — AB (ref 8.9–10.3)
CO2: 25 mmol/L (ref 22–32)
CREATININE: 1.01 mg/dL — AB (ref 0.44–1.00)
Chloride: 102 mmol/L (ref 98–111)
GFR, EST AFRICAN AMERICAN: 55 mL/min — AB (ref >60)
GFR, EST NON AFRICAN AMERICAN: 47 mL/min — AB (ref >60)
Glucose, Bld: 106 mg/dL — ABNORMAL HIGH (ref 70–99)
Potassium: 4.8 mmol/L (ref 3.5–5.1)
Sodium: 135 mmol/L (ref 135–145)
TOTAL PROTEIN: 5.9 g/dL — AB (ref 6.5–8.1)

## 2017-12-19 LAB — TSH: TSH: 1.25 u[IU]/mL (ref 0.308–3.960)

## 2017-12-19 LAB — MAGNESIUM: MAGNESIUM: 1.8 mg/dL (ref 1.7–2.4)

## 2017-12-19 MED ORDER — SODIUM CHLORIDE 0.9 % IJ SOLN
10.0000 mL | INTRAMUSCULAR | Status: DC | PRN
  Administered 2017-12-19: 10 mL via INTRAVENOUS
  Filled 2017-12-19: qty 10

## 2017-12-19 MED ORDER — HEPARIN SOD (PORK) LOCK FLUSH 100 UNIT/ML IV SOLN
500.0000 [IU] | Freq: Once | INTRAVENOUS | Status: AC
  Administered 2017-12-19: 500 [IU] via INTRAVENOUS
  Filled 2017-12-19: qty 5

## 2017-12-19 MED ORDER — SODIUM CHLORIDE 0.9% FLUSH
10.0000 mL | Freq: Once | INTRAVENOUS | Status: AC
  Administered 2017-12-19: 10 mL via INTRAVENOUS
  Filled 2017-12-19: qty 10

## 2017-12-19 NOTE — Telephone Encounter (Signed)
Printed avs and calender of upcoming appointment. Per 9/19

## 2017-12-20 ENCOUNTER — Other Ambulatory Visit: Payer: Self-pay | Admitting: Internal Medicine

## 2017-12-20 MED ORDER — SULFAMETHOXAZOLE-TRIMETHOPRIM 800-160 MG PO TABS: 1.0000 | ORAL_TABLET | Freq: Two times a day (BID) | ORAL | 0 refills | Status: DC

## 2017-12-20 NOTE — Progress Notes (Signed)
Patient reports still having diarrhea. Her GIP still identified salmonella. Will treat with 5d course of bactrim to see if any improvement. Has had previous positive test earlier in the summer. Difficult to tell if some of her symptoms were due to cdifficile vs. Other infectious causes vs. Post infectious ibs.

## 2018-01-03 ENCOUNTER — Telehealth: Payer: Self-pay | Admitting: Behavioral Health

## 2018-01-03 NOTE — Telephone Encounter (Signed)
Patient called stating she was calling for an update for Dr. Baxter Flattery.  No clear what she was speaking of but she states she was supposed to follow up with Dr. Baxter Flattery. Pricilla Riffle RN

## 2018-01-06 ENCOUNTER — Other Ambulatory Visit: Payer: Self-pay | Admitting: Hematology

## 2018-01-07 ENCOUNTER — Other Ambulatory Visit: Payer: Self-pay | Admitting: Internal Medicine

## 2018-01-07 ENCOUNTER — Telehealth: Payer: Self-pay

## 2018-01-07 ENCOUNTER — Other Ambulatory Visit: Payer: Self-pay

## 2018-01-07 DIAGNOSIS — A020 Salmonella enteritis: Secondary | ICD-10-CM

## 2018-01-07 MED ORDER — MIRTAZAPINE 15 MG PO TABS: ORAL_TABLET | ORAL | 2 refills | Status: AC

## 2018-01-07 NOTE — Telephone Encounter (Signed)
Can you get a stool kit for stool culture and GI pathogen panel. Her husband to pick up on 10/9. I will place order in epic

## 2018-01-07 NOTE — Telephone Encounter (Signed)
Called pt to notify that mirtazipine reordered and sent to [facility] on [address]. Pt grateful for the update.

## 2018-01-08 NOTE — Telephone Encounter (Signed)
Stool kit placed at front desk for Mr. Weldin to retrieve. Pricilla Riffle RN

## 2018-01-09 ENCOUNTER — Other Ambulatory Visit: Payer: Medicare Other

## 2018-01-09 ENCOUNTER — Ambulatory Visit: Payer: Medicare Other

## 2018-01-09 DIAGNOSIS — A020 Salmonella enteritis: Secondary | ICD-10-CM

## 2018-01-16 ENCOUNTER — Inpatient Hospital Stay: Payer: Medicare Other

## 2018-01-16 ENCOUNTER — Telehealth: Payer: Self-pay

## 2018-01-16 ENCOUNTER — Inpatient Hospital Stay (HOSPITAL_BASED_OUTPATIENT_CLINIC_OR_DEPARTMENT_OTHER): Payer: Medicare Other | Admitting: Hematology

## 2018-01-16 ENCOUNTER — Inpatient Hospital Stay: Payer: Medicare Other | Attending: Hematology

## 2018-01-16 VITALS — BP 107/60 | HR 70 | Temp 97.5°F | Resp 17 | Ht 61.0 in | Wt 103.4 lb

## 2018-01-16 DIAGNOSIS — C651 Malignant neoplasm of right renal pelvis: Secondary | ICD-10-CM

## 2018-01-16 DIAGNOSIS — Z923 Personal history of irradiation: Secondary | ICD-10-CM | POA: Diagnosis not present

## 2018-01-16 DIAGNOSIS — M7989 Other specified soft tissue disorders: Secondary | ICD-10-CM | POA: Diagnosis not present

## 2018-01-16 DIAGNOSIS — C787 Secondary malignant neoplasm of liver and intrahepatic bile duct: Secondary | ICD-10-CM | POA: Diagnosis not present

## 2018-01-16 DIAGNOSIS — C7951 Secondary malignant neoplasm of bone: Secondary | ICD-10-CM | POA: Insufficient documentation

## 2018-01-16 DIAGNOSIS — C7800 Secondary malignant neoplasm of unspecified lung: Secondary | ICD-10-CM | POA: Insufficient documentation

## 2018-01-16 DIAGNOSIS — Z87891 Personal history of nicotine dependence: Secondary | ICD-10-CM | POA: Insufficient documentation

## 2018-01-16 DIAGNOSIS — D649 Anemia, unspecified: Secondary | ICD-10-CM

## 2018-01-16 DIAGNOSIS — R197 Diarrhea, unspecified: Secondary | ICD-10-CM

## 2018-01-16 DIAGNOSIS — Z95828 Presence of other vascular implants and grafts: Secondary | ICD-10-CM

## 2018-01-16 LAB — CBC WITH DIFFERENTIAL/PLATELET
Abs Immature Granulocytes: 0.03 10*3/uL (ref 0.00–0.07)
BASOS PCT: 0 %
Basophils Absolute: 0 10*3/uL (ref 0.0–0.1)
EOS ABS: 0 10*3/uL (ref 0.0–0.5)
EOS PCT: 0 %
HEMATOCRIT: 30.8 % — AB (ref 36.0–46.0)
Hemoglobin: 9.8 g/dL — ABNORMAL LOW (ref 12.0–15.0)
Immature Granulocytes: 1 %
LYMPHS ABS: 1 10*3/uL (ref 0.7–4.0)
Lymphocytes Relative: 17 %
MCH: 32.8 pg (ref 26.0–34.0)
MCHC: 31.8 g/dL (ref 30.0–36.0)
MCV: 103 fL — ABNORMAL HIGH (ref 80.0–100.0)
MONO ABS: 0.7 10*3/uL (ref 0.1–1.0)
Monocytes Relative: 11 %
NEUTROS PCT: 71 %
Neutro Abs: 4.4 10*3/uL (ref 1.7–7.7)
PLATELETS: 252 10*3/uL (ref 150–400)
RBC: 2.99 MIL/uL — ABNORMAL LOW (ref 3.87–5.11)
RDW: 19.4 % — AB (ref 11.5–15.5)
WBC: 6.2 10*3/uL (ref 4.0–10.5)
nRBC: 0 % (ref 0.0–0.2)

## 2018-01-16 LAB — GASTROINTESTINAL PATHOGEN PANEL PCR
C. DIFFICILE TOX A/B, PCR: NOT DETECTED
Campylobacter, PCR: NOT DETECTED
Cryptosporidium, PCR: NOT DETECTED
E COLI (ETEC) LT/ST, PCR: NOT DETECTED
E COLI (STEC) STX1/STX2, PCR: NOT DETECTED
E coli 0157, PCR: NOT DETECTED
GIARDIA LAMBLIA, PCR: NOT DETECTED
Norovirus, PCR: NOT DETECTED
ROTAVIRUS, PCR: NOT DETECTED
SALMONELLA, PCR: DETECTED — AB
Shigella, PCR: NOT DETECTED

## 2018-01-16 LAB — CMP (CANCER CENTER ONLY)
ALBUMIN: 2.2 g/dL — AB (ref 3.5–5.0)
ALK PHOS: 56 U/L (ref 38–126)
ALT: 9 U/L (ref 0–44)
ANION GAP: 8 (ref 5–15)
AST: 14 U/L — ABNORMAL LOW (ref 15–41)
BILIRUBIN TOTAL: 0.4 mg/dL (ref 0.3–1.2)
BUN: 14 mg/dL (ref 8–23)
CALCIUM: 8.5 mg/dL — AB (ref 8.9–10.3)
CO2: 26 mmol/L (ref 22–32)
CREATININE: 1.08 mg/dL — AB (ref 0.44–1.00)
Chloride: 103 mmol/L (ref 98–111)
GFR, Est AFR Am: 50 mL/min — ABNORMAL LOW (ref >60)
GFR, Estimated: 44 mL/min — ABNORMAL LOW (ref >60)
GLUCOSE: 96 mg/dL (ref 70–99)
Potassium: 4.7 mmol/L (ref 3.5–5.1)
Sodium: 137 mmol/L (ref 135–145)
TOTAL PROTEIN: 6.4 g/dL — AB (ref 6.5–8.1)

## 2018-01-16 LAB — STOOL CULTURE

## 2018-01-16 LAB — MAGNESIUM: Magnesium: 1.7 mg/dL (ref 1.7–2.4)

## 2018-01-16 LAB — PHOSPHORUS: Phosphorus: 3.4 mg/dL (ref 2.5–4.6)

## 2018-01-16 MED ORDER — SODIUM CHLORIDE 0.9 % IJ SOLN
10.0000 mL | INTRAMUSCULAR | Status: DC | PRN
  Administered 2018-01-16: 10 mL via INTRAVENOUS
  Filled 2018-01-16: qty 10

## 2018-01-16 MED ORDER — HEPARIN SOD (PORK) LOCK FLUSH 100 UNIT/ML IV SOLN
500.0000 [IU] | Freq: Once | INTRAVENOUS | Status: DC | PRN
  Filled 2018-01-16: qty 5

## 2018-01-16 MED ORDER — SODIUM CHLORIDE 0.9 % IJ SOLN
10.0000 mL | INTRAMUSCULAR | Status: DC | PRN
  Filled 2018-01-16: qty 10

## 2018-01-16 MED ORDER — HEPARIN SOD (PORK) LOCK FLUSH 100 UNIT/ML IV SOLN
500.0000 [IU] | Freq: Once | INTRAVENOUS | Status: AC | PRN
  Administered 2018-01-16: 500 [IU] via INTRAVENOUS
  Filled 2018-01-16: qty 5

## 2018-01-16 NOTE — Patient Instructions (Signed)
Implanted Port Home Guide An implanted port is a type of central line that is placed under the skin. Central lines are used to provide IV access when treatment or nutrition needs to be given through a person's veins. Implanted ports are used for long-term IV access. An implanted port may be placed because:  You need IV medicine that would be irritating to the small veins in your hands or arms.  You need long-term IV medicines, such as antibiotics.  You need IV nutrition for a long period.  You need frequent blood draws for lab tests.  You need dialysis.  Implanted ports are usually placed in the chest area, but they can also be placed in the upper arm, the abdomen, or the leg. An implanted port has two main parts:  Reservoir. The reservoir is round and will appear as a small, raised area under your skin. The reservoir is the part where a needle is inserted to give medicines or draw blood.  Catheter. The catheter is a thin, flexible tube that extends from the reservoir. The catheter is placed into a large vein. Medicine that is inserted into the reservoir goes into the catheter and then into the vein.  How will I care for my incision site? Do not get the incision site wet. Bathe or shower as directed by your health care provider. How is my port accessed? Special steps must be taken to access the port:  Before the port is accessed, a numbing cream can be placed on the skin. This helps numb the skin over the port site.  Your health care provider uses a sterile technique to access the port. ? Your health care provider must put on a mask and sterile gloves. ? The skin over your port is cleaned carefully with an antiseptic and allowed to dry. ? The port is gently pinched between sterile gloves, and a needle is inserted into the port.  Only "non-coring" port needles should be used to access the port. Once the port is accessed, a blood return should be checked. This helps ensure that the port  is in the vein and is not clogged.  If your port needs to remain accessed for a constant infusion, a clear (transparent) bandage will be placed over the needle site. The bandage and needle will need to be changed every week, or as directed by your health care provider.  Keep the bandage covering the needle clean and dry. Do not get it wet. Follow your health care provider's instructions on how to take a shower or bath while the port is accessed.  If your port does not need to stay accessed, no bandage is needed over the port.  What is flushing? Flushing helps keep the port from getting clogged. Follow your health care provider's instructions on how and when to flush the port. Ports are usually flushed with saline solution or a medicine called heparin. The need for flushing will depend on how the port is used.  If the port is used for intermittent medicines or blood draws, the port will need to be flushed: ? After medicines have been given. ? After blood has been drawn. ? As part of routine maintenance.  If a constant infusion is running, the port may not need to be flushed.  How long will my port stay implanted? The port can stay in for as long as your health care provider thinks it is needed. When it is time for the port to come out, surgery will be   done to remove it. The procedure is similar to the one performed when the port was put in. When should I seek immediate medical care? When you have an implanted port, you should seek immediate medical care if:  You notice a bad smell coming from the incision site.  You have swelling, redness, or drainage at the incision site.  You have more swelling or pain at the port site or the surrounding area.  You have a fever that is not controlled with medicine.  This information is not intended to replace advice given to you by your health care provider. Make sure you discuss any questions you have with your health care provider. Document  Released: 03/19/2005 Document Revised: 08/25/2015 Document Reviewed: 11/24/2012 Elsevier Interactive Patient Education  2017 Elsevier Inc.  

## 2018-01-16 NOTE — Progress Notes (Signed)
Hematology oncology clinic follow-up.  Date of service:  01/16/18    Patient Care Team: [name], MD as PCP - General (Internal Medicine)   Urologist: Dr. 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  Treatment -Atezolizumab IV q3weeks  Delton See q12weeks for bone mets -palliative radiation to rt renal pelvis mass  HISTORY OF PRESENTING ILLNESS: Please see my previous notes for details of initial presentation.  INTERVAL HISTORY:   [name] is here for her scheduled follow-up and cycle 46 treatment with Atezolizumab. The patient's last visit with [place] was on 12/19/17. She is accompanied today by her husband. The pt reports that she is doing well overall.   The pt reports that she is continuing to have diarrhea, and is awaiting the results of her most recent stool study with Dr. Baxter Flattery in ID on 01/09/18. The pt notes that yesterday she had 4-5 liquid bowel movements yesterday. She notes that her diarrhea does not exceed 10 times a day. She adds that occasionally there is blood in her stools. She has not received a colonoscopy or sigmoidoscopy as her salmonella infection has not settled. She notes that she is eating better but has low energy levels.   The pt also notes that her right leg has been swollen compared to her left leg. She notes that the swelling is new.   Lab results today (01/16/18) of CBC w/diff, CMP is as follows: all values are WNL except for RBC at 2.99, HGB at 9.8, HCT at 30.8, MCV at 103.0, RDW at 19.4, Creatinine at 1.08, Calcium at 8.5, Total Protein at 6.4, Albumin at 2.2, AST at 14, GFR at 44. 01/16/18 Magnesium at 1.7 01/16/18 Phosphorus at 3.4  On review of systems, pt reports persistent diarrhea, low energy, right leg swelling, stable weight,  eating better, and denies fevers, chills, weak appetite, blood in the urine, lower back pains, and any other symptoms.    MEDICAL HISTORY:  Past Medical History:  Diagnosis Date  . Arthritis     knees 03-10-12 had Cortisone injection  . Cancer [facility]) june 2016   metastatic  . DDD (degenerative disc disease), cervical   . Edema leg    feet and ankles  . Esophagus disorder    Had esophagus stretched  . GERD (gastroesophageal reflux disease)    Benign stricture dilated in 2011  . Gout    Patient notes she has possible gout but has not been on chronic medications for this  . Headache(784.0)   . Hypertension   . Occipital neuralgia   . Occipital neuralgia    Related to cervical degenerative disc disease  . Peptic ulcer disease    Previous history in the remote past  . Ulcerative proctitis ([place])   . Ulcerative proctitis ([place]) 2014    SURGICAL HISTORY: Past Surgical History:  Procedure Laterality Date  . ABDOMINAL HYSTERECTOMY    . APPENDECTOMY    . CHOLECYSTECTOMY    . COLONOSCOPY WITH PROPOFOL  03/25/2012   Procedure: COLONOSCOPY WITH PROPOFOL;  Surgeon: Garlan Fair, MD;  Location: [facility];  Service: Endoscopy;  Laterality: N/A;  . DILATION AND CURETTAGE OF UTERUS    . ESOPHAGEAL DILATION     For benign stricture in 2011  . ESOPHAGOGASTRODUODENOSCOPY    . FLEXIBLE SIGMOIDOSCOPY N/A 04/26/2014   Procedure:  FLEXIBLE SIGMOIDOSCOPY - UnSedated;  Surgeon: Garlan Fair, MD;  Location: [facility];  Service: Endoscopy;  Laterality: N/A;  . FLEXIBLE SIGMOIDOSCOPY N/A 01/24/2015   Procedure: FLEXIBLE SIGNMOIDOSCOPY W/ FLEET ENEMIA;  Surgeon: Garlan Fair, MD;  Location: [facility];  Service: Endoscopy;  Laterality: N/A;  . FLEXIBLE SIGMOIDOSCOPY N/A 05/15/2016   Procedure: FLEXIBLE SIGMOIDOSCOPY;  Surgeon: Garlan Fair, MD;  Location: [facility];  Service: Endoscopy;  Laterality: N/A;  pt needs to ahve an enema upon arrival   . NECK SURGERY     20 years ago    . TOTAL ABDOMINAL HYSTERECTOMY W/ BILATERAL SALPINGOOPHORECTOMY     At age 46 due to miscarriage and retained products of conception causing significant uterine bleeding. Patient has been on estrogen replacement therapy since then.    SOCIAL HISTORY: Social History   Socioeconomic History  . Marital status: Married    Spouse name: Not on file  . Number of children: Not on file  . Years of education: Not on file  . Highest education level: Not on file  Occupational History  . Not on file  Social Needs  . Financial resource strain: Not on file  . Food insecurity:    Worry: Not on file    Inability: Not on file  . Transportation needs:    Medical: Not on file    Non-medical: Not on file  Tobacco Use  . Smoking status: Former Smoker    Last attempt to quit: 11/01/1983    Years since quitting: 34.2  . Smokeless tobacco: Never Used  Substance and Sexual Activity  . Alcohol use: Yes    Comment: occ  . Drug use: No  . Sexual activity: Never  Lifestyle  . Physical activity:    Days per week: Not on file    Minutes per session: Not on file  . Stress: Not on file  Relationships  . Social connections:    Talks on phone: Not on file    Gets together: Not on file    Attends religious service: Not on file    Active member of club or organization: Not on file    Attends meetings of clubs or organizations: Not on file    Relationship status: Not on file  . Intimate partner violence:    Fear of current or ex partner: Not on file    Emotionally abused: Not on file    Physically abused: Not on file    Forced sexual activity: Not on file  Other Topics Concern  . Not on file  Social History Narrative  . Not on file  Retired as Conservation officer, nature for [employer]. She is very well spoken and intelligent individual and exhibits a keen knowledge of her medical conditions.  FAMILY HISTORY: Family History  Problem Relation Age of Onset  . Lung cancer Sister   . Prostate  cancer Brother   . Uterine cancer Maternal Aunt   . Breast cancer Paternal Grandmother   . Hodgkin's lymphoma Sister     ALLERGIES:  is allergic to nitrofurantoin; indomethacin; and lisinopril.  MEDICATIONS:  Current Outpatient Medications  Medication Sig Dispense Refill  . acetaminophen (TYLENOL) 650 MG CR tablet Take 1,300 mg by mouth 2 (two) times daily.    Marland Kitchen amLODipine (NORVASC) 5 MG tablet Take 5 mg by mouth daily.    . cholestyramine (QUESTRAN) 4 g packet MIX AND DRINK 1 PACKET(4 GRAMS) BY MOUTH TWICE DAILY (Patient not taking: Reported on 12/04/2017) 180  packet 1  . diclofenac sodium (VOLTAREN) 1 % GEL APPLY 2 TO 4 GRAMS Q 6 H PRN FOR PAIN  0  . estradiol (ESTRACE) 0.5 MG tablet Take 0.5 mg by mouth daily.  1  . feeding supplement, ENSURE ENLIVE, (ENSURE ENLIVE) LIQD Take 237 mLs by mouth 2 (two) times daily between meals. 237 mL 12  . fidaxomicin (DIFICID) 200 MG TABS tablet Take 1 tablet (200 mg total) by mouth 2 (two) times daily. 20 tablet 3  . furosemide (LASIX) 20 MG tablet Take 1 tablet (20 mg total) by mouth daily for 3 days. 3 tablet 0  . iron polysaccharides (NIFEREX) 150 MG capsule Take 1 capsule (150 mg total) by mouth daily. (Patient not taking: Reported on 09/27/2017) 30 capsule 1  . mirtazapine (REMERON) 15 MG tablet TAKE 1 TABLET(15 MG) BY MOUTH AT BEDTIME 90 tablet 2  . ondansetron (ZOFRAN ODT) 4 MG disintegrating tablet Take 1 tablet (4 mg total) by mouth every 8 (eight) hours as needed for nausea or vomiting. 20 tablet 0  . predniSONE (DELTASONE) 10 MG tablet   0  . PRESCRIPTION MEDICATION Chemo card    . Probiotic Product (PROBIOTIC DAILY PO) Take by mouth.    . Respiratory Therapy Supplies (FLUTTER) DEVI 1 each by Does not apply route daily. 1 each 0  . sulfamethoxazole-trimethoprim (BACTRIM DS,SEPTRA DS) 800-160 MG tablet Take 1 tablet by mouth 2 (two) times daily. 10 tablet 0  . triamcinolone ointment (KENALOG) 0.5 % APPLY EXTERNALLY TO THE AFFECTED AREA TWICE  DAILY 30 g 0  . vancomycin (VANCOCIN) 125 MG capsule Take 1 capsule (125 mg total) by mouth 4 (four) times daily. (Patient not taking: Reported on 11/18/2017) 10 capsule 0  . vitamin B-12 (CYANOCOBALAMIN) 100 MCG tablet Take 100 mcg by mouth daily.     No current facility-administered medications for this visit.    Facility-Administered Medications Ordered in Other Visits  Medication Dose Route Frequency Provider Last Rate Last Dose  . sodium chloride 0.9 % injection 10 mL  10 mL Intravenous PRN [name], MD   10 mL at 02/28/17 1602   REVIEW OF SYSTEMS:    A 10+ POINT REVIEW OF SYSTEMS WAS OBTAINED including neurology, dermatology, psychiatry, cardiac, respiratory, lymph, extremities, GI, GU, Musculoskeletal, constitutional, breasts, reproductive, HEENT.  All pertinent positives are noted in the HPI.  All others are negative.   PHYSICAL EXAMINATION: ECOG PERFORMANCE STATUS: 2  Vitals:   01/16/18 1034  BP: 107/60  Pulse: 70  Resp: 17  Temp: (!) 97.5 F (36.4 C)  SpO2: 97%   Filed Weights   01/16/18 1034  Weight: 103 lb 6.4 oz (46.9 kg)     GENERAL:alert, in no acute distress and comfortable SKIN: no acute rashes, no significant lesions EYES: conjunctiva are pink and non-injected, sclera anicteric OROPHARYNX: MMM, no exudates, no oropharyngeal erythema or ulceration NECK: supple, no JVD LYMPH:  no palpable lymphadenopathy in the cervical, axillary or inguinal regions LUNGS: clear to auscultation b/l with normal respiratory effort HEART: regular rate & rhythm ABDOMEN:  normoactive bowel sounds , non tender, not distended. No palpable hepatosplenomegaly.  Extremity: 2+ right pedal edema, 1+ left pedal edema PSYCH: alert & oriented x 3 with fluent speech NEURO: no focal motor/sensory deficits   LABORATORY DATA:   CBC Latest Ref Rng & Units 01/16/2018 12/19/2017 11/28/2017  WBC 4.0 - 10.5 K/uL 6.2 6.3 8.1  Hemoglobin 12.0 - 15.0 g/dL 9.8(L) 10.4(L) 11.8    Hematocrit 36.0 -  46.0 % 30.8(L) 30.8(L) 36.6  Platelets 150 - 400 K/uL 252 321 273   . CBC    Component Value Date/Time   WBC 6.2 01/16/2018 0929   RBC 2.99 (L) 01/16/2018 0929   HGB 9.8 (L) 01/16/2018 0929   HGB 8.3 (L) 08/15/2017 1121   HGB 9.0 (L) 03/21/2017 1352   HCT 30.8 (L) 01/16/2018 0929   HCT 29.3 (L) 03/21/2017 1352   PLT 252 01/16/2018 0929   PLT 414 (H) 08/15/2017 1121   PLT 311 03/21/2017 1352   MCV 103.0 (H) 01/16/2018 0929   MCV 90.7 03/21/2017 1352   MCH 32.8 01/16/2018 0929   MCHC 31.8 01/16/2018 0929   RDW 19.4 (H) 01/16/2018 0929   RDW 16.3 (H) 03/21/2017 1352   LYMPHSABS 1.0 01/16/2018 0929   LYMPHSABS 1.6 03/21/2017 1352   MONOABS 0.7 01/16/2018 0929   MONOABS 0.6 03/21/2017 1352   EOSABS 0.0 01/16/2018 0929   EOSABS 0.1 03/21/2017 1352   BASOSABS 0.0 01/16/2018 0929   BASOSABS 0.0 03/21/2017 1352      CMP Latest Ref Rng & Units 01/16/2018 12/19/2017 11/28/2017  Glucose 70 - 99 mg/dL 96 106(H) 86  BUN 8 - 23 mg/dL 14 14 13   Creatinine 0.44 - 1.00 mg/dL 1.08(H) 1.01(H) 0.88  Sodium 135 - 145 mmol/L 137 135 136  Potassium 3.5 - 5.1 mmol/L 4.7 4.8 4.5  Chloride 98 - 111 mmol/L 103 102 103  CO2 22 - 32 mmol/L 26 25 25   Calcium 8.9 - 10.3 mg/dL 8.5(L) 8.2(L) 9.0  Total Protein 6.5 - 8.1 g/dL 6.4(L) 5.9(L) 6.6  Total Bilirubin 0.3 - 1.2 mg/dL 0.4 0.4 0.3  Alkaline Phos 38 - 126 U/L 56 55 48  AST 15 - 41 U/L 14(L) 13(L) 13(L)  ALT 0 - 44 U/L 9 <6 6   . Lab Results  Component Value Date   LDH 228 05/11/2016   Radiology .No results found.  ASSESSMENT & PLAN:   84 year old Caucasian female with  #1 Right Renal pelvis metastatic transitional cell carcinoma.  Initial PET scan revealed metastases to the lungs, liver, multiple nodal stations and bones. -PET scan done after 8 cycles of treatment for restaging disease showed significant response to treatment with no evidence of active disease. Patient has no clinical evidence of disease  progression at this time. CT chest 06/16/2015 showed no evidence of cancer progression. PET/CT scan done on 10/21/2015- showed no findings for metastatic disease. New right-sided hydronephrosis versus local tumor recurrence needs to be evaluated further. MRI of the kidney showed hyperenhancing the right renal pelvis lesion consistent with concern for local recurrence. -PET/CT 03/06/2016 with no findings of metastatic disease. Extensive right-sided hydronephrosis with high activity from excreted FDG obscuring any findings along the collecting system. -PET/CT  06/13/2016- Interval increase in size of right kidney. Findings are suspicious for progression of residual/recurrent urothelial carcinoma with further dilatation of the right renal collecting system. 2. No evidence for hypermetabolic metastases.  -PET/CT 10/22/2016 - Continued interval progression of the abnormal soft tissue associated with the right kidney. This soft tissue remains markedly hypermetabolic and is compatible with continued progression of recurrent right renal disease. 2. No evidence for hypermetabolic metastases in the neck, chest, abdomen, or pelvis. PET/CT 04/04/2017-  Interval enlargement of and increase in hypermetabolism within the right renal mass, consistent with residual/recurrent disease. Increase in disease progression along the proximal right ureter. 2. No evidence of abdominopelvic nodal or extra abdominal metastatic disease.  09/03/17 PET which revealed 1. Considerable  enlargement of the RIGHT renal hilar mass with persistent intense peripheral hypermetabolic activity consistent with neoplasm. 2. Extension of neoplasm into the proximal RIGHT ureter with mild inferior progression (2 cm)  -Pt finished palliative radiation with Dr. Tammi Klippel on 10/25/17   #2 Bone metastases   PLAN:   -C.diff was positive but toxins were negative, salmonella persists on 01/09/18 GI pathogen panel.  -Discussed that the source of her diarrhea could  be informed by inflammatory proctitis prior to beginning immunotherapy, radiation therapy, salmonella and immunotherapy itself -Continue with mesalamine suppositories as recommended by Dr. Paulita Fujita in GI -Would prefer a colonoscopy/sigmoidoscopy to rule out immunotherapy cause to her diarrhea -pending -Pt is slowly appearing clinically improved  -continue Xgeva to q12 weeks since she has completed 2 yrs of this treatment already. -transfuse prn for hgb <8 or if symptomatic. -Discussed pt labwork today, 01/16/18; HGB decreased some to 9.8, electrolytes are normal, no overt dehydration -Keep legs elevated, wear graded compression socks, stay hydrated  -Waiting for infection to be cleared before restarting Atezolizumab.  -Continue follow up with GI and ID -Will consider Budesonide if infection is cleared  -Continue with Boost or Ensure as supplements  -Continue probiotics  -Likely a role for monthly Sandostatin injection and will order this  -Will order VAS [place] of RLE for new right leg swelling  -Will repeat PET/CT before next visit  -Will see the pt back in 4-5 weeks   #3 h/o skin rash likely related to her Atezolizumab. Minimal grade 1 and controlled with topical triamcinolone. Unchanged today.  #4 chronic bronchiectasis with Cough and some clear secretions likely due to bronchiectasis. Followed by Dr Melvyn Novas. -No chest pain no increased shortness of breath or dyspnea on exertion  -Has an action plan with Dr. Melvyn Novas to use when necessary antibiotics for worsening symptoms. -Continue follow-up with Dr. 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 Wynetta Emery.  But stopped using it due to cost issues. Was then on PO budesonide which she has stopped as well. last sigmoidscopy with Dr Wynetta Emery - showed stable ulcerative proctitis    -transfuse prn for hgb<8. Does not report overt rectal bleeding at this time. -put back on mesalamine supps per Dr Paulita Fujita  #6 Recent Salmonella enteritis and C. Diff -- returned -continue f/u with Dr Baxter Flattery - appreciate help.  #7 history of hypertension -on Amlodipine    -Hold Atezolizumab(immunotherapy) appointments for now -Start Sandostatin q4weeks for diarrhea (new) in 1 week -[place] lower extremity veins today -PET/CT in 4 weeks -RTC with Dr Irene Limbo with labs in 5 weeks     All of the patients questions were answered with apparent satisfaction. The patient knows to call the clinic with any problems, questions or concerns.  The total time spent in the appt was 25 minutes and more than 50% was on counseling and direct patient cares.      Sullivan Lone MD MS Hematology/Oncology Physician [facility]  (Office):       [phone] (Work cell):  [phone] (Fax):           [phone]  I, Baldwin [place], am acting as a scribe for Dr. Irene Limbo  .I have reviewed the above documentation for accuracy and completeness, and I agree with the above. [name] MD

## 2018-01-16 NOTE — Telephone Encounter (Signed)
Printed avs and calender of upcoming appointment. Per 10/17 los 

## 2018-01-22 ENCOUNTER — Encounter: Payer: Self-pay | Admitting: Hematology

## 2018-01-22 NOTE — Progress Notes (Signed)
Received authorization from [employer] # [id number] Authorization valid from 01/24/2018-12/12/209 Scanned into patient chart

## 2018-01-23 ENCOUNTER — Telehealth: Payer: Self-pay | Admitting: *Deleted

## 2018-01-23 ENCOUNTER — Ambulatory Visit (HOSPITAL_COMMUNITY)
Admission: RE | Admit: 2018-01-23 | Discharge: 2018-01-23 | Disposition: A | Discharge status: Home / Self Care | Payer: Medicare Other | Source: Ambulatory Visit | Attending: Hematology | Admitting: Hematology

## 2018-01-23 DIAGNOSIS — M7989 Other specified soft tissue disorders: Secondary | ICD-10-CM | POA: Diagnosis not present

## 2018-01-23 DIAGNOSIS — C651 Malignant neoplasm of right renal pelvis: Secondary | ICD-10-CM | POA: Diagnosis not present

## 2018-01-23 DIAGNOSIS — R9389 Abnormal findings on diagnostic imaging of other specified body structures: Secondary | ICD-10-CM | POA: Insufficient documentation

## 2018-01-23 NOTE — Telephone Encounter (Signed)
Notified by Cecilie Lowers from [facility] U/S: Right lower ext negative for DVT. Baker's cyst behind right knee

## 2018-01-23 NOTE — Progress Notes (Signed)
Right lower extremity venous duplex has been completed. Negative for obvious evidence of DVT. A complex, mixed echogenic structure measuring 2.6 cm high by 3.4 cm wide by greater than 5.2 cm long is found in the right popliteal fossa. Results were given to [facility] at Dr. Grier Mitts office.  01/23/18 2:15 PM Carlos Levering RVT

## 2018-01-24 ENCOUNTER — Inpatient Hospital Stay: Payer: Medicare Other

## 2018-01-24 MED ORDER — OCTREOTIDE ACETATE 30 MG IM KIT
PACK | INTRAMUSCULAR | Status: AC
  Filled 2018-01-24: qty 1

## 2018-01-27 DIAGNOSIS — R197 Diarrhea, unspecified: Secondary | ICD-10-CM | POA: Diagnosis not present

## 2018-01-28 ENCOUNTER — Encounter: Payer: Self-pay | Admitting: Nutrition

## 2018-01-28 NOTE — Progress Notes (Signed)
Patient called and cancelled nutrition appointment.

## 2018-01-29 ENCOUNTER — Telehealth: Payer: Self-pay | Admitting: *Deleted

## 2018-01-29 NOTE — Telephone Encounter (Signed)
Gave Dr. Irene Limbo information received in Triage regarding leg. He asked that patient be called and instructed to contact primary care provider for evaluation. Patient verbalized understanding.

## 2018-01-29 NOTE — Telephone Encounter (Signed)
Received call from patient this afternoon.  She states she is having ongoing and worsening problem with swelling in her right leg. It is now swollen to 2x its normal size and it extends up to her thigh.  It is now weeping serous fluids and developing some sores on it as well. Her left foot is swollen as well.  She continues to have diarrhea-another ongoing issue that is being addressed by some other physicians too.  Her main concern is the worsening of her right leg.  She had right lower extremity venous duplex done on 01/23/18 and no obvious DVT noted. She is unsure of what to do at [facility] point. It is becoming difficult to walk, she is wearing larger men's shoes and is concerned about the fluid weeping from her leg.  Her next appt is not until 02/21/18.

## 2018-01-30 ENCOUNTER — Other Ambulatory Visit: Payer: Medicare Other

## 2018-01-30 ENCOUNTER — Ambulatory Visit: Payer: Medicare Other

## 2018-01-30 ENCOUNTER — Encounter: Payer: Medicare Other | Admitting: Nutrition

## 2018-01-30 ENCOUNTER — Telehealth: Payer: Self-pay | Admitting: *Deleted

## 2018-01-30 DIAGNOSIS — C7951 Secondary malignant neoplasm of bone: Secondary | ICD-10-CM | POA: Diagnosis not present

## 2018-01-30 DIAGNOSIS — R600 Localized edema: Secondary | ICD-10-CM | POA: Diagnosis not present

## 2018-01-30 NOTE — Telephone Encounter (Signed)
-----   Message from Carlyle Basques, MD sent at 01/29/2018 10:28 PM EDT ----- Could you call her and ask her to take bactrim only once a day ( I believe that it was prescribed twice a day) can we have her come in early next week(monday) if possible to check her BMP.

## 2018-02-02 DIAGNOSIS — M5020 Other cervical disc displacement, unspecified cervical region: Secondary | ICD-10-CM | POA: Diagnosis not present

## 2018-02-02 DIAGNOSIS — C7951 Secondary malignant neoplasm of bone: Secondary | ICD-10-CM | POA: Diagnosis not present

## 2018-02-02 DIAGNOSIS — M5412 Radiculopathy, cervical region: Secondary | ICD-10-CM | POA: Diagnosis not present

## 2018-02-02 DIAGNOSIS — R238 Other skin changes: Secondary | ICD-10-CM | POA: Diagnosis not present

## 2018-02-02 DIAGNOSIS — R600 Localized edema: Secondary | ICD-10-CM | POA: Diagnosis not present

## 2018-02-02 DIAGNOSIS — C689 Malignant neoplasm of urinary organ, unspecified: Secondary | ICD-10-CM | POA: Diagnosis not present

## 2018-02-02 DIAGNOSIS — D638 Anemia in other chronic diseases classified elsewhere: Secondary | ICD-10-CM | POA: Diagnosis not present

## 2018-02-02 DIAGNOSIS — C787 Secondary malignant neoplasm of liver and intrahepatic bile duct: Secondary | ICD-10-CM | POA: Diagnosis not present

## 2018-02-02 DIAGNOSIS — S51812D Laceration without foreign body of left forearm, subsequent encounter: Secondary | ICD-10-CM | POA: Diagnosis not present

## 2018-02-02 DIAGNOSIS — A0471 Enterocolitis due to Clostridium difficile, recurrent: Secondary | ICD-10-CM | POA: Diagnosis not present

## 2018-02-03 ENCOUNTER — Other Ambulatory Visit: Payer: Self-pay | Admitting: Internal Medicine

## 2018-02-03 DIAGNOSIS — A020 Salmonella enteritis: Secondary | ICD-10-CM

## 2018-02-03 NOTE — Telephone Encounter (Signed)
-----   Message from Carlyle Basques, MD sent at 02/03/2018 11:48 AM EST ----- I spoke to the patient (she was given bactrim by her gastroenterologist, will outlaw, last Monday. I have asked her to decrease the dose since it maybe too high for her cr clearance.  She will come into the clinic for lab work tomorrow. Could you place her on the lab schedule for tomorrow around 3-4pm. Bmp order is in.  ----- Message ----- From: Reggy Eye, CMA Sent: 01/30/2018   3:41 PM EST To: Carlyle Basques, MD  Patient has not had Bactrim for 3 weeks, she is still having diarrhea and she is having trouble with swelling in her right leg (per note it is swollen to 2 x its normal size and she is to contact her PCP about it). She also has CMP scheduled with Cancer center do you still want me to have her come? Do you want her to take more Bactrim? Darnelle Maffucci  ----- Message ----- From: Carlyle Basques, MD Sent: 01/29/2018  10:28 PM EDT To: Reggy Eye, CMA  Could you call her and ask her to take bactrim only once a day ( I believe that it was prescribed twice a day) can we have her come in early next week(monday) if possible to check her BMP.

## 2018-02-03 NOTE — Telephone Encounter (Signed)
Per message from Dr Baxter Flattery patient placed on the lab schedule for 02/04/18 for repeat BMP.

## 2018-02-04 ENCOUNTER — Other Ambulatory Visit: Payer: Medicare Other

## 2018-02-04 DIAGNOSIS — A020 Salmonella enteritis: Secondary | ICD-10-CM | POA: Diagnosis not present

## 2018-02-04 LAB — BASIC METABOLIC PANEL
BUN/Creatinine Ratio: 13 (calc) (ref 6–22)
BUN: 19 mg/dL (ref 7–25)
CHLORIDE: 102 mmol/L (ref 98–110)
CO2: 22 mmol/L (ref 20–32)
CREATININE: 1.44 mg/dL — AB (ref 0.60–0.88)
Calcium: 8.5 mg/dL — ABNORMAL LOW (ref 8.6–10.4)
Glucose, Bld: 94 mg/dL (ref 65–99)
Potassium: 5.4 mmol/L — ABNORMAL HIGH (ref 3.5–5.3)
Sodium: 131 mmol/L — ABNORMAL LOW (ref 135–146)

## 2018-02-07 ENCOUNTER — Telehealth: Payer: Self-pay | Admitting: *Deleted

## 2018-02-07 NOTE — Telephone Encounter (Signed)
Per Baxter Flattery request called the patient and advised to stop taking the Bactrim and that Baxter Flattery will call on Monday to check in and discuss additional labs that may need to be drawn. Patient voiced understanding and will wait for call.

## 2018-02-07 NOTE — Telephone Encounter (Signed)
-----   Message from Carlyle Basques, MD sent at 02/06/2018 11:39 AM EST ----- Regarding: Test results Can u call her today to ask her to stop Taking bactrim( what dr outlaw prescribed) her cr on her bloodwork increased, which I think is due to bactrim. I would like her to stop the abtx and I will call her on Monday to schedule blood work next week.

## 2018-02-14 ENCOUNTER — Ambulatory Visit (HOSPITAL_COMMUNITY)
Admission: RE | Admit: 2018-02-14 | Discharge: 2018-02-14 | Disposition: A | Discharge status: Home / Self Care | Payer: Medicare Other | Source: Ambulatory Visit | Attending: Hematology | Admitting: Hematology

## 2018-02-14 DIAGNOSIS — R197 Diarrhea, unspecified: Secondary | ICD-10-CM

## 2018-02-14 DIAGNOSIS — C659 Malignant neoplasm of unspecified renal pelvis: Secondary | ICD-10-CM | POA: Diagnosis not present

## 2018-02-14 DIAGNOSIS — C651 Malignant neoplasm of right renal pelvis: Secondary | ICD-10-CM | POA: Diagnosis not present

## 2018-02-14 DIAGNOSIS — C7951 Secondary malignant neoplasm of bone: Secondary | ICD-10-CM

## 2018-02-14 LAB — GLUCOSE, CAPILLARY: Glucose-Capillary: 77 mg/dL (ref 70–99)

## 2018-02-14 MED ORDER — FLUDEOXYGLUCOSE F - 18 (FDG) INJECTION
5.2000 | Freq: Once | INTRAVENOUS | Status: AC | PRN
  Administered 2018-02-14: 5.2 via INTRAVENOUS

## 2018-02-21 ENCOUNTER — Inpatient Hospital Stay: Payer: Medicare Other

## 2018-02-21 ENCOUNTER — Other Ambulatory Visit: Payer: Medicare Other

## 2018-02-21 ENCOUNTER — Inpatient Hospital Stay: Payer: Medicare Other | Attending: Hematology | Admitting: Hematology

## 2018-02-21 VITALS — BP 118/59 | HR 79 | Temp 98.1°F | Resp 18 | Ht 61.0 in | Wt 102.3 lb

## 2018-02-21 DIAGNOSIS — C787 Secondary malignant neoplasm of liver and intrahepatic bile duct: Secondary | ICD-10-CM | POA: Insufficient documentation

## 2018-02-21 DIAGNOSIS — Z95828 Presence of other vascular implants and grafts: Secondary | ICD-10-CM

## 2018-02-21 DIAGNOSIS — R197 Diarrhea, unspecified: Secondary | ICD-10-CM | POA: Diagnosis not present

## 2018-02-21 DIAGNOSIS — C7951 Secondary malignant neoplasm of bone: Secondary | ICD-10-CM

## 2018-02-21 DIAGNOSIS — C651 Malignant neoplasm of right renal pelvis: Secondary | ICD-10-CM

## 2018-02-21 DIAGNOSIS — C659 Malignant neoplasm of unspecified renal pelvis: Secondary | ICD-10-CM

## 2018-02-21 DIAGNOSIS — M7989 Other specified soft tissue disorders: Secondary | ICD-10-CM

## 2018-02-21 DIAGNOSIS — I10 Essential (primary) hypertension: Secondary | ICD-10-CM | POA: Diagnosis not present

## 2018-02-21 LAB — CMP (CANCER CENTER ONLY)
ALBUMIN: 2.4 g/dL — AB (ref 3.5–5.0)
ALK PHOS: 50 U/L (ref 38–126)
AST: 16 U/L (ref 15–41)
Anion gap: 9 (ref 5–15)
BUN: 17 mg/dL (ref 8–23)
CHLORIDE: 102 mmol/L (ref 98–111)
CO2: 25 mmol/L (ref 22–32)
Calcium: 9.1 mg/dL (ref 8.9–10.3)
Creatinine: 1.07 mg/dL — ABNORMAL HIGH (ref 0.44–1.00)
GFR, EST AFRICAN AMERICAN: 51 mL/min — AB (ref >60)
GFR, Estimated: 44 mL/min — ABNORMAL LOW (ref >60)
Glucose, Bld: 94 mg/dL (ref 70–99)
Potassium: 4.4 mmol/L (ref 3.5–5.1)
SODIUM: 136 mmol/L (ref 135–145)
TOTAL PROTEIN: 6.6 g/dL (ref 6.5–8.1)
Total Bilirubin: 0.5 mg/dL (ref 0.3–1.2)

## 2018-02-21 LAB — CBC WITH DIFFERENTIAL/PLATELET
ABS IMMATURE GRANULOCYTES: 0.02 10*3/uL (ref 0.00–0.07)
BASOS PCT: 0 %
Basophils Absolute: 0 10*3/uL (ref 0.0–0.1)
EOS ABS: 0 10*3/uL (ref 0.0–0.5)
EOS PCT: 0 %
HCT: 29.3 % — ABNORMAL LOW (ref 36.0–46.0)
HEMOGLOBIN: 9.3 g/dL — AB (ref 12.0–15.0)
IMMATURE GRANULOCYTES: 0 %
Lymphocytes Relative: 25 %
Lymphs Abs: 1.3 10*3/uL (ref 0.7–4.0)
MCH: 33.9 pg (ref 26.0–34.0)
MCHC: 31.7 g/dL (ref 30.0–36.0)
MCV: 106.9 fL — ABNORMAL HIGH (ref 80.0–100.0)
Monocytes Absolute: 0.8 10*3/uL (ref 0.1–1.0)
Monocytes Relative: 15 %
NRBC: 0 % (ref 0.0–0.2)
Neutro Abs: 3 10*3/uL (ref 1.7–7.7)
Neutrophils Relative %: 60 %
PLATELETS: 240 10*3/uL (ref 150–400)
RBC: 2.74 MIL/uL — AB (ref 3.87–5.11)
RDW: 15 % (ref 11.5–15.5)
WBC: 5.1 10*3/uL (ref 4.0–10.5)

## 2018-02-21 MED ORDER — DENOSUMAB 120 MG/1.7ML ~~LOC~~ SOLN
SUBCUTANEOUS | Status: AC
  Filled 2018-02-21: qty 1.7

## 2018-02-21 MED ORDER — OCTREOTIDE ACETATE 30 MG IM KIT
PACK | INTRAMUSCULAR | Status: AC
  Filled 2018-02-21: qty 1

## 2018-02-21 MED ORDER — DENOSUMAB 120 MG/1.7ML ~~LOC~~ SOLN
120.0000 mg | Freq: Once | SUBCUTANEOUS | Status: AC
  Administered 2018-02-21: 120 mg via SUBCUTANEOUS

## 2018-02-21 MED ORDER — OCTREOTIDE ACETATE 30 MG IM KIT
30.0000 mg | PACK | Freq: Once | INTRAMUSCULAR | Status: AC
  Administered 2018-02-21: 30 mg via INTRAMUSCULAR

## 2018-02-21 MED ORDER — HEPARIN SOD (PORK) LOCK FLUSH 100 UNIT/ML IV SOLN
500.0000 [IU] | Freq: Once | INTRAVENOUS | Status: AC | PRN
  Administered 2018-02-21: 500 [IU] via INTRAVENOUS
  Filled 2018-02-21: qty 5

## 2018-02-21 NOTE — Progress Notes (Signed)
Hematology oncology clinic follow-up.  Date of service:  02/21/18    Patient Care Team: [name], MD as PCP - General (Internal Medicine)   Urologist: Dr. 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  Treatment -Atezolizumab IV q3weeks  [place] See q12weeks for bone mets -palliative radiation to rt renal pelvis mass  HISTORY OF PRESENTING ILLNESS: Please see my previous notes for details of initial presentation.  INTERVAL HISTORY:   Tiffany [name] is here for her scheduled follow-up and cycle 46 treatment with Atezolizumab. The patient's last visit with [place] was on 01/16/18. She is accompanied today by her husband. The pt reports that she is doing well overall.   The pt reports that her diarrhea has slowly been improving. She has 2-3 movements a day, and notes that slightly more form to her stools, less watery. The pt notes that she has been eating well, despite a poor appetite, and her weight has been stable over the last month. The pt notes that she has not seen ID or GI in the interim. She was stopped on Bactrim by ID due to concern of worsening kidney functions.   Her ankles are wrapped for her leg swelling, and she has begun receiving home care and occupational therapy.  She denies any fevers, chills, night sweats, or overt difficulty breathing but notes she tires more easily. The pt notes that "she has no pain" in general.   Of note since the patient's last visit, pt has had a PET/CT completed on 02/14/18 with results revealing New hypermetabolic mediastinal, left hilar and left axillary lymph nodes consistent with metastatic disease. 2. Although the hypermetabolic activity associated with the dominant necrotic mass in the right mid abdomen has improved, the mass is  somewhat larger, and there is a new hypermetabolic mass in the right false pelvis. There is probable tumor extension along the right ureter and IVC. 3. No evidence of osseous or pulmonary metastatic disease.  The pt also had a [place] Vas LE on 01/23/18 which revealed Right: There is no evidence of deep vein thrombosis in the lower extremity.   Lab results today (02/21/18) of CBC w/diff, CMP is as follows: all values are WNL except for RBC at 2.74, HGB at 9.3, HCT at 29.3, MCV at 106.9, Creatinine at 1.07, Albumin at 2.4, GFR at 44.  On review of systems, pt reports tiring easily, improved diarrhea, ankle swelling, and denies fevers, chills, night sweats, difficulty breathing, pain in general, and any other symptoms.    MEDICAL HISTORY:  Past Medical History:  Diagnosis Date  . Arthritis     knees 03-10-12 had Cortisone injection  . Cancer [facility]) june 2016   metastatic  . DDD (degenerative disc disease), cervical   . Edema leg    feet and ankles  . Esophagus disorder    Had esophagus stretched  . GERD (gastroesophageal reflux disease)    Benign stricture dilated in 2011  . Gout    Patient notes she has possible gout but has not been on chronic medications for this  . Headache(784.0)   . Hypertension   . Occipital neuralgia   . Occipital neuralgia    Related to cervical degenerative disc disease  . Peptic ulcer disease    Previous history in the remote past  . Ulcerative proctitis ([place])   .  Ulcerative proctitis ([place]) 2014    SURGICAL HISTORY: Past Surgical History:  Procedure Laterality Date  . ABDOMINAL HYSTERECTOMY    . APPENDECTOMY    . CHOLECYSTECTOMY    . COLONOSCOPY WITH PROPOFOL  03/25/2012   Procedure: COLONOSCOPY WITH PROPOFOL;  Surgeon: Garlan Fair, MD;  Location: [facility];  Service: Endoscopy;  Laterality: N/A;  . DILATION AND CURETTAGE OF UTERUS    . ESOPHAGEAL DILATION     For benign stricture in 2011  . ESOPHAGOGASTRODUODENOSCOPY    . FLEXIBLE  SIGMOIDOSCOPY N/A 04/26/2014   Procedure: FLEXIBLE SIGMOIDOSCOPY - UnSedated;  Surgeon: Garlan Fair, MD;  Location: [facility];  Service: Endoscopy;  Laterality: N/A;  . FLEXIBLE SIGMOIDOSCOPY N/A 01/24/2015   Procedure: FLEXIBLE SIGNMOIDOSCOPY W/ FLEET ENEMIA;  Surgeon: Garlan Fair, MD;  Location: [facility];  Service: Endoscopy;  Laterality: N/A;  . FLEXIBLE SIGMOIDOSCOPY N/A 05/15/2016   Procedure: FLEXIBLE SIGMOIDOSCOPY;  Surgeon: Garlan Fair, MD;  Location: [facility];  Service: Endoscopy;  Laterality: N/A;  pt needs to ahve an enema upon arrival   . NECK SURGERY     20 years ago  . TOTAL ABDOMINAL HYSTERECTOMY W/ BILATERAL SALPINGOOPHORECTOMY     At age 77 due to miscarriage and retained products of conception causing significant uterine bleeding. Patient has been on estrogen replacement therapy since then.    SOCIAL HISTORY: Social History   Socioeconomic History  . Marital status: Married    Spouse name: Not on file  . Number of children: Not on file  . Years of education: Not on file  . Highest education level: Not on file  Occupational History  . Not on file  Social Needs  . Financial resource strain: Not on file  . Food insecurity:    Worry: Not on file    Inability: Not on file  . Transportation needs:    Medical: Not on file    Non-medical: Not on file  Tobacco Use  . Smoking status: Former Smoker    Last attempt to quit: 11/01/1983    Years since quitting: 34.3  . Smokeless tobacco: Never Used  Substance and Sexual Activity  . Alcohol use: Yes    Comment: occ  . Drug use: No  . Sexual activity: Never  Lifestyle  . Physical activity:    Days per week: Not on file    Minutes per session: Not on file  . Stress: Not on file  Relationships  . Social connections:    Talks on phone: Not on file    Gets together: Not on file    Attends religious service: Not on file    Active member of club or organization: Not on file    Attends meetings of  clubs or organizations: Not on file    Relationship status: Not on file  . Intimate partner violence:    Fear of current or ex partner: Not on file    Emotionally abused: Not on file    Physically abused: Not on file    Forced sexual activity: Not on file  Other Topics Concern  . Not on file  Social History Narrative  . Not on file  Retired as Conservation officer, nature for [employer] She is very well spoken and intelligent individual and exhibits a keen knowledge of her medical conditions.  FAMILY HISTORY: Family History  Problem Relation Age of Onset  . Lung cancer Sister   . Prostate cancer Brother   . Uterine cancer Maternal  Aunt   . Breast cancer Paternal Grandmother   . Hodgkin's lymphoma Sister     ALLERGIES:  is allergic to nitrofurantoin; indomethacin; and lisinopril.  MEDICATIONS:  Current Outpatient Medications  Medication Sig Dispense Refill  . acetaminophen (TYLENOL) 650 MG CR tablet Take 1,300 mg by mouth 2 (two) times daily.    Marland Kitchen amLODipine (NORVASC) 5 MG tablet Take 5 mg by mouth daily.    . cholestyramine (QUESTRAN) 4 g packet MIX AND DRINK 1 PACKET(4 GRAMS) BY MOUTH TWICE DAILY (Patient not taking: Reported on 12/04/2017) 180 packet 1  . diclofenac sodium (VOLTAREN) 1 % GEL APPLY 2 TO 4 GRAMS Q 6 H PRN FOR PAIN  0  . estradiol (ESTRACE) 0.5 MG tablet Take 0.5 mg by mouth daily.  1  . feeding supplement, ENSURE ENLIVE, (ENSURE ENLIVE) LIQD Take 237 mLs by mouth 2 (two) times daily between meals. 237 mL 12  . fidaxomicin (DIFICID) 200 MG TABS tablet Take 1 tablet (200 mg total) by mouth 2 (two) times daily. 20 tablet 3  . furosemide (LASIX) 20 MG tablet Take 1 tablet (20 mg total) by mouth daily for 3 days. 3 tablet 0  . iron polysaccharides (NIFEREX) 150 MG capsule Take 1 capsule (150 mg total) by mouth daily. (Patient not taking: Reported on 09/27/2017) 30 capsule 1  . mirtazapine (REMERON) 15 MG tablet TAKE 1 TABLET(15 MG) BY MOUTH AT BEDTIME 90 tablet 2    . ondansetron (ZOFRAN ODT) 4 MG disintegrating tablet Take 1 tablet (4 mg total) by mouth every 8 (eight) hours as needed for nausea or vomiting. 20 tablet 0  . predniSONE (DELTASONE) 10 MG tablet   0  . PRESCRIPTION MEDICATION Chemo card    . Probiotic Product (PROBIOTIC DAILY PO) Take by mouth.    . Respiratory Therapy Supplies (FLUTTER) DEVI 1 each by Does not apply route daily. 1 each 0  . sulfamethoxazole-trimethoprim (BACTRIM DS,SEPTRA DS) 800-160 MG tablet Take 1 tablet by mouth 2 (two) times daily. 10 tablet 0  . triamcinolone ointment (KENALOG) 0.5 % APPLY EXTERNALLY TO THE AFFECTED AREA TWICE DAILY 30 g 0  . vancomycin (VANCOCIN) 125 MG capsule Take 1 capsule (125 mg total) by mouth 4 (four) times daily. (Patient not taking: Reported on 11/18/2017) 10 capsule 0  . vitamin B-12 (CYANOCOBALAMIN) 100 MCG tablet Take 100 mcg by mouth daily.     No current facility-administered medications for this visit.    Facility-Administered Medications Ordered in Other Visits  Medication Dose Route Frequency Provider Last Rate Last Dose  . denosumab (XGEVA) injection 120 mg  120 mg Subcutaneous Once [name], MD      . octreotide (SANDOSTATIN LAR) IM injection 30 mg  30 mg Intramuscular Once [name], MD      . sodium chloride 0.9 % injection 10 mL  10 mL Intravenous PRN [name], MD   10 mL at 02/28/17 1602   REVIEW OF SYSTEMS:    A 10+ POINT REVIEW OF SYSTEMS WAS OBTAINED including neurology, dermatology, psychiatry, cardiac, respiratory, lymph, extremities, GI, GU, Musculoskeletal, constitutional, breasts, reproductive, HEENT.  All pertinent positives are noted in the HPI.  All others are negative.   PHYSICAL EXAMINATION: ECOG PERFORMANCE STATUS: 2  Vitals:   02/21/18 1156  BP: (!) 118/59  Pulse: 79  Resp: 18  Temp: 98.1 F (36.7 C)  SpO2: 97%   Filed Weights   02/21/18 1156  Weight: 102 lb 4.8 oz (46.4 kg)  GENERAL:alert, in no acute  distress and comfortable SKIN: no acute rashes, no significant lesions EYES: conjunctiva are pink and non-injected, sclera anicteric OROPHARYNX: MMM, no exudates, no oropharyngeal erythema or ulceration NECK: supple, no JVD LYMPH:  no palpable lymphadenopathy in the cervical, axillary or inguinal regions LUNGS: clear to auscultation b/l with normal respiratory effort HEART: regular rate & rhythm ABDOMEN:  normoactive bowel sounds , non tender, not distended. No palpable hepatosplenomegaly.  Extremity: 2+ right pedal edema, 1+ left pedal edema PSYCH: alert & oriented x 3 with fluent speech NEURO: no focal motor/sensory deficits   LABORATORY DATA:   CBC Latest Ref Rng & Units 02/21/2018 01/16/2018 12/19/2017  WBC 4.0 - 10.5 K/uL 5.1 6.2 6.3  Hemoglobin 12.0 - 15.0 g/dL 9.3(L) 9.8(L) 10.4(L)  Hematocrit 36.0 - 46.0 % 29.3(L) 30.8(L) 30.8(L)  Platelets 150 - 400 K/uL 240 252 321   . CBC    Component Value Date/Time   WBC 5.1 02/21/2018 1052   RBC 2.74 (L) 02/21/2018 1052   HGB 9.3 (L) 02/21/2018 1052   HGB 8.3 (L) 08/15/2017 1121   HGB 9.0 (L) 03/21/2017 1352   HCT 29.3 (L) 02/21/2018 1052   HCT 29.3 (L) 03/21/2017 1352   PLT 240 02/21/2018 1052   PLT 414 (H) 08/15/2017 1121   PLT 311 03/21/2017 1352   MCV 106.9 (H) 02/21/2018 1052   MCV 90.7 03/21/2017 1352   MCH 33.9 02/21/2018 1052   MCHC 31.7 02/21/2018 1052   RDW 15.0 02/21/2018 1052   RDW 16.3 (H) 03/21/2017 1352   LYMPHSABS 1.3 02/21/2018 1052   LYMPHSABS 1.6 03/21/2017 1352   MONOABS 0.8 02/21/2018 1052   MONOABS 0.6 03/21/2017 1352   EOSABS 0.0 02/21/2018 1052   EOSABS 0.1 03/21/2017 1352   BASOSABS 0.0 02/21/2018 1052   BASOSABS 0.0 03/21/2017 1352      CMP Latest Ref Rng & Units 02/21/2018 02/04/2018 01/16/2018  Glucose 70 - 99 mg/dL 94 94 96  BUN 8 - 23 mg/dL 17 19 14   Creatinine 0.44 - 1.00 mg/dL 1.07(H) 1.44(H) 1.08(H)  Sodium 135 - 145 mmol/L 136 131(L) 137  Potassium 3.5 - 5.1 mmol/L 4.4 5.4(H) 4.7   Chloride 98 - 111 mmol/L 102 102 103  CO2 22 - 32 mmol/L 25 22 26   Calcium 8.9 - 10.3 mg/dL 9.1 8.5(L) 8.5(L)  Total Protein 6.5 - 8.1 g/dL 6.6 - 6.4(L)  Total Bilirubin 0.3 - 1.2 mg/dL 0.5 - 0.4  Alkaline Phos 38 - 126 U/L 50 - 56  AST 15 - 41 U/L 16 - 14(L)  ALT 0 - 44 U/L <6 - 9   . Lab Results  Component Value Date   LDH 228 05/11/2016   Radiology .Nm Pet Image Restag (ps) Skull Base To Thigh  Result Date: 02/14/2018 CLINICAL DATA:  Subsequent treatment strategy for transitional cell carcinoma of the right renal pelvis. EXAM: NUCLEAR MEDICINE PET SKULL BASE TO THIGH TECHNIQUE: 5.2 mCi F-18 FDG was injected intravenously. Full-ring PET imaging was performed from the skull base to thigh after the radiotracer. CT data was obtained and used for attenuation correction and anatomic localization. Fasting blood glucose: 77 mg/dl COMPARISON:  PET-CT 04/04/2017 and 09/03/2017 FINDINGS: Mediastinal blood pool activity: SUV max 1.8 NECK: No hypermetabolic cervical lymph nodes are identified.There are no lesions of the pharyngeal mucosal space. Incidental CT findings: Bilateral carotid atherosclerosis. CHEST: There are multiple new nodular areas of hypermetabolic activity within the mediastinum, left hilum and left axilla consistent with adenopathy. An approximately 5  mm subcarinal node on image 65/4 has an SUV max of 5.5. Left hilar node has an SUV max of 4.6. Left axillary nodes have an SUV max of 4.1. There is no suspicious pulmonary activity or nodularity. Incidental CT findings: Right IJ Port-A-Cath extends to the superior cavoatrial junction. There is diffuse atherosclerosis of the aorta, great vessels and coronary arteries. Mild emphysema is noted. ABDOMEN/PELVIS: The large, centrally necrotic right abdominal mass has slightly enlarged, measuring 11.7 x 10.2 cm on image 119/4 (previously 10.6 x 9.7 cm). The associated peripheral hypermetabolic activity has improved. The greatest metabolic activity  is present laterally (SUV max 17.2). Separate from this mass, there is a new heterogeneous right pelvic mass measuring 8.0 x 5.7 cm on image 138/4. This mass is also hypermetabolic with an SUV max of 27.5. There is probable hypermetabolic tumor extension along the right ureter. There is probable hypermetabolic tumor within the right renal vein and IVC. There is no abnormal metabolic activity within the liver, spleen, pancreas or left kidney. Incidental CT findings: Diffuse aortic and branch vessel atherosclerosis. There is increased edema throughout the subcutaneous and deep abdominal fat. SKELETON: There is no hypermetabolic activity to suggest osseous metastatic disease. Incidental CT findings: There are degenerative changes throughout the spine. IMPRESSION: 1. New hypermetabolic mediastinal, left hilar and left axillary lymph nodes consistent with metastatic disease. 2. Although the hypermetabolic activity associated with the dominant necrotic mass in the right mid abdomen has improved, the mass is somewhat larger, and there is a new hypermetabolic mass in the right false pelvis. There is probable tumor extension along the right ureter and IVC. 3. No evidence of osseous or pulmonary metastatic disease. Electronically Signed   By: Richardean Sale M.D.   On: 02/14/2018 15:23   Vas [place] Lower Extremity Venous (dvt)  Result Date: 01/23/2018  Lower Venous Study Indications: Swelling.  Limitations: Poor ultrasound/tissue interface and patient pain tolerance. Performing Technologist: Oliver Hum RVT  Examination Guidelines: A complete evaluation includes B-mode imaging, spectral Doppler, color Doppler, and power Doppler as needed of all accessible portions of each vessel. Bilateral testing is considered an integral part of a complete examination. Limited examinations for reoccurring indications may be performed as noted.  Right Venous Findings: +---------+---------------+---------+-----------+----------+-------+           CompressibilityPhasicitySpontaneityPropertiesSummary +---------+---------------+---------+-----------+----------+-------+ CFV      Full           Yes      Yes                          +---------+---------------+---------+-----------+----------+-------+ SFJ      Full                                                 +---------+---------------+---------+-----------+----------+-------+ FV Prox  Full                                                 +---------+---------------+---------+-----------+----------+-------+ FV Mid   Full                                                 +---------+---------------+---------+-----------+----------+-------+  FV DistalFull                                                 +---------+---------------+---------+-----------+----------+-------+ PFV      Full                                                 +---------+---------------+---------+-----------+----------+-------+ POP      Full           Yes      Yes                          +---------+---------------+---------+-----------+----------+-------+ PTV      Full                                                 +---------+---------------+---------+-----------+----------+-------+ PERO     Full                                                 +---------+---------------+---------+-----------+----------+-------+  Left Venous Findings: +---+---------------+---------+-----------+----------+-------+    CompressibilityPhasicitySpontaneityPropertiesSummary +---+---------------+---------+-----------+----------+-------+ CFVFull           Yes      Yes                          +---+---------------+---------+-----------+----------+-------+    Summary: Right: There is no evidence of deep vein thrombosis in the lower extremity. A complex, mixed echogenic structure measuring 2.6 cm high by 3.4 cm wide by greater than 5.2 cm long is found in the popliteal fossa. Left: No  evidence of common femoral vein obstruction.  *See table(s) above for measurements and observations. Electronically signed by Servando Snare MD on 01/23/2018 at 3:56:33 PM.    Final     ASSESSMENT & PLAN:   84 year old Caucasian female with  #1 Right Renal pelvis metastatic transitional cell carcinoma.  Initial PET scan revealed metastases to the lungs, liver, multiple nodal stations and bones. -PET scan done after 8 cycles of treatment for restaging disease showed significant response to treatment with no evidence of active disease. Patient has no clinical evidence of disease progression at this time. CT chest 06/16/2015 showed no evidence of cancer progression. PET/CT scan done on 10/21/2015- showed no findings for metastatic disease. New right-sided hydronephrosis versus local tumor recurrence needs to be evaluated further. MRI of the kidney showed hyperenhancing the right renal pelvis lesion consistent with concern for local recurrence. -PET/CT 03/06/2016 with no findings of metastatic disease. Extensive right-sided hydronephrosis with high activity from excreted FDG obscuring any findings along the collecting system. -PET/CT  06/13/2016- Interval increase in size of right kidney. Findings are suspicious for progression of residual/recurrent urothelial carcinoma with further dilatation of the right renal collecting system. 2. No evidence for hypermetabolic metastases.  -PET/CT 10/22/2016 - Continued interval progression of the abnormal soft tissue associated with the right kidney. This soft tissue remains markedly hypermetabolic and is compatible with  continued progression of recurrent right renal disease. 2. No evidence for hypermetabolic metastases in the neck, chest, abdomen, or pelvis. PET/CT 04/04/2017-  Interval enlargement of and increase in hypermetabolism within the right renal mass, consistent with residual/recurrent disease. Increase in disease progression along the proximal right ureter. 2.  No evidence of abdominopelvic nodal or extra abdominal metastatic disease.  09/03/17 PET which revealed 1. Considerable enlargement of the RIGHT renal hilar mass with persistent intense peripheral hypermetabolic activity consistent with neoplasm. 2. Extension of neoplasm into the proximal RIGHT ureter with mild inferior progression (2 cm)  -Pt finished palliative radiation with Dr. Tammi Klippel on 10/25/17   #2 Bone metastases   PLAN:   -Discussed pt labwork today, 02/21/18; blood counts and chemistries are stable -Discussed the 02/14/18 PET/CT which revealed New hypermetabolic mediastinal, left hilar and left axillary lymph nodes consistent with metastatic disease. 2. Although the hypermetabolic activity associated with the dominant necrotic mass in the right mid abdomen has improved, the mass is somewhat larger, and there is a new hypermetabolic mass in the right false pelvis. There is probable tumor extension along the right ureter and IVC. 3. No evidence of osseous or pulmonary metastatic disease.  -Discussed the 01/23/18 [place] Vas which revealed Right: There is no evidence of deep vein thrombosis in the lower extremity. A complex, mixed echogenic structure measuring 2.6 cm high by 3.4 cm wide by greater than 5.2 cm long is found in the popliteal fossa -Discussed options of resuming immunotherapy which could possibly worsen diarrhea vs comfort measures alone. The pt would not prefer restarting chemotherapy, nor is it my recommendation. The pt expresses exceptional clarity in our conversation regarding her goals of care, and notes that she would not like to restart immunotherapy, understands the implications of this decision, and is at peace with these implications.  -Will hold off engaging Hospice services until the pt notes she would like this, and will continue discussing this option. The pt knows to call me if she decides she would like Hospice services before our next visit.  -Will continue home care  services -Will see the pt back in 4 weeks    #3 h/o skin rash likely related to her Atezolizumab. Minimal grade 1 and controlled with topical triamcinolone. Unchanged today.  #4 chronic bronchiectasis with Cough and some clear secretions likely due to bronchiectasis. Followed by Dr Melvyn Novas. -No chest pain no increased shortness of breath or dyspnea on exertion  -Has an action plan with Dr. Melvyn Novas to use when necessary antibiotics for worsening symptoms. -Continue follow-up with Dr. Melvyn Novas for continued cares -continue tessalon perles prn  #5 Recent Salmonella enteritis and C. Diff - -continue f/u with Dr Baxter Flattery - appreciate help.  #7 history of hypertension -on Amlodipine    RTC with Dr Irene Limbo in 4 weeks with labs    All of the patients questions were answered with apparent satisfaction. The patient knows to call the clinic with any problems, questions or concerns.  The total time spent in the appt was 30 minutes and more than 50% was on counseling and direct patient cares.     Sullivan Lone MD [place] Hematology/Oncology Physician [facility]  (Office):       [phone] (Work cell):  [phone] (Fax):           [phone]  I, Baldwin [place], am acting as a scribe for Dr. Sullivan Lone.   .I have reviewed the above documentation for accuracy and completeness, and I agree with the above. Marland Kitchen  [name] MD

## 2018-02-21 NOTE — Patient Instructions (Signed)
Octreotide injection solution What is this medicine? OCTREOTIDE (ok TREE oh tide) is used to reduce blood levels of growth hormone in patients with a condition called acromegaly. This medicine also reduces flushing and watery diarrhea caused by certain types of cancer. This medicine may be used for other purposes; ask your health care provider or pharmacist if you have questions. COMMON BRAND NAME(S): Sandostatin, Sandostatin LAR What should I tell my health care provider before I take this medicine? They need to know if you have any of these conditions: -gallbladder disease -kidney disease -liver disease -an unusual or allergic reaction to octreotide, other medicines, foods, dyes, or preservatives -pregnant or trying to get pregnant -breast-feeding How should I use this medicine? This medicine is for injection under the skin or into a vein (only in emergency situations). It is usually given by a health care professional in a hospital or clinic setting. If you get this medicine at home, you will be taught how to prepare and give this medicine. Allow the injection solution to come to room temperature before use. Do not warm it artificially. Use exactly as directed. Take your medicine at regular intervals. Do not take your medicine more often than directed. It is important that you put your used needles and syringes in a special sharps container. Do not put them in a trash can. If you do not have a sharps container, call your pharmacist or healthcare provider to get one. Talk to your pediatrician regarding the use of this medicine in children. Special care may be needed. Overdosage: If you think you have taken too much of this medicine contact a poison control center or emergency room at once. NOTE: This medicine is only for you. Do not share this medicine with others. What if I miss a dose? If you miss a dose, take it as soon as you can. If it is almost time for your next dose, take only that  dose. Do not take double or extra doses. What may interact with this medicine? Do not take this medicine with any of the following medications: -cisapride -droperidol -general anesthetics -grepafloxacin -perphenazine -thioridazine This medicine may also interact with the following medications: -bromocriptine -cyclosporine -diuretics -medicines for blood pressure, heart disease, irregular heart beat -medicines for diabetes, including insulin -quinidine This list may not describe all possible interactions. Give your health care provider a list of all the medicines, herbs, non-prescription drugs, or dietary supplements you use. Also tell them if you smoke, drink alcohol, or use illegal drugs. Some items may interact with your medicine. What should I watch for while using this medicine? Visit your doctor or health care professional for regular checks on your progress. To help reduce irritation at the injection site, use a different site for each injection and make sure the solution is at room temperature before use. This medicine may cause increases or decreases in blood sugar. Signs of high blood sugar include frequent urination, unusual thirst, flushed or dry skin, difficulty breathing, drowsiness, stomach ache, nausea, vomiting or dry mouth. Signs of low blood sugar include chills, cool, pale skin or cold sweats, drowsiness, extreme hunger, fast heartbeat, headache, nausea, nervousness or anxiety, shakiness, trembling, unsteadiness, tiredness, or weakness. Contact your doctor or health care professional right away if you experience any of these symptoms. What side effects may I notice from receiving this medicine? Side effects that you should report to your doctor or health care professional as soon as possible: -allergic reactions like skin rash, itching or hives, swelling   of the face, lips, or tongue -changes in blood sugar -changes in heart rate -severe stomach pain Side effects that  usually do not require medical attention (report to your doctor or health care professional if they continue or are bothersome): -diarrhea or constipation -gas or stomach pain -nausea, vomiting -pain, redness, swelling and irritation at site where injected This list may not describe all possible side effects. Call your doctor for medical advice about side effects. You may report side effects to FDA at 1-800-FDA-1088. Where should I keep my medicine? Keep out of the reach of children. Store in a refrigerator between 2 and 8 degrees C (36 and 46 degrees F). Protect from light. Allow to come to room temperature naturally. Do not use artificial heat. If protected from light, the injection may be stored at room temperature between 20 and 30 degrees C (70 and 86 degrees F) for 14 days. After the initial use, throw away any unused portion of a multiple dose vial after 14 days. Throw away unused portions of the ampules after use. NOTE: This sheet is a summary. It may not cover all possible information. If you have questions about this medicine, talk to your doctor, pharmacist, or health care provider.  2018 Elsevier/Gold Standard (2007-10-14 16:56:04)  Denosumab injection What is this medicine? DENOSUMAB (den oh sue mab) slows bone breakdown. Prolia is used to treat osteoporosis in women after menopause and in men. Xgeva is used to treat a high calcium level due to cancer and to prevent bone fractures and other bone problems caused by multiple myeloma or cancer bone metastases. Xgeva is also used to treat giant cell tumor of the bone. This medicine may be used for other purposes; ask your health care provider or pharmacist if you have questions. COMMON BRAND NAME(S): Prolia, XGEVA What should I tell my health care provider before I take this medicine? They need to know if you have any of these conditions: -dental disease -having surgery or tooth extraction -infection -kidney disease -low levels of  calcium or Vitamin D in the blood -malnutrition -on hemodialysis -skin conditions or sensitivity -thyroid or parathyroid disease -an unusual reaction to denosumab, other medicines, foods, dyes, or preservatives -pregnant or trying to get pregnant -breast-feeding How should I use this medicine? This medicine is for injection under the skin. It is given by a health care professional in a hospital or clinic setting. If you are getting Prolia, a special MedGuide will be given to you by the pharmacist with each prescription and refill. Be sure to read this information carefully each time. For Prolia, talk to your pediatrician regarding the use of this medicine in children. Special care may be needed. For Xgeva, talk to your pediatrician regarding the use of this medicine in children. While this drug may be prescribed for children as young as 13 years for selected conditions, precautions do apply. Overdosage: If you think you have taken too much of this medicine contact a poison control center or emergency room at once. NOTE: This medicine is only for you. Do not share this medicine with others. What if I miss a dose? It is important not to miss your dose. Call your doctor or health care professional if you are unable to keep an appointment. What may interact with this medicine? Do not take this medicine with any of the following medications: -other medicines containing denosumab This medicine may also interact with the following medications: -medicines that lower your chance of fighting infection -steroid medicines like prednisone   or cortisone This list may not describe all possible interactions. Give your health care provider a list of all the medicines, herbs, non-prescription drugs, or dietary supplements you use. Also tell them if you smoke, drink alcohol, or use illegal drugs. Some items may interact with your medicine. What should I watch for while using this medicine? Visit your doctor or  health care professional for regular checks on your progress. Your doctor or health care professional may order blood tests and other tests to see how you are doing. Call your doctor or health care professional for advice if you get a fever, chills or sore throat, or other symptoms of a cold or flu. Do not treat yourself. This drug may decrease your body's ability to fight infection. Try to avoid being around people who are sick. You should make sure you get enough calcium and vitamin D while you are taking this medicine, unless your doctor tells you not to. Discuss the foods you eat and the vitamins you take with your health care professional. See your dentist regularly. Brush and floss your teeth as directed. Before you have any dental work done, tell your dentist you are receiving this medicine. Do not become pregnant while taking this medicine or for 5 months after stopping it. Talk with your doctor or health care professional about your birth control options while taking this medicine. Women should inform their doctor if they wish to become pregnant or think they might be pregnant. There is a potential for serious side effects to an unborn child. Talk to your health care professional or pharmacist for more information. What side effects may I notice from receiving this medicine? Side effects that you should report to your doctor or health care professional as soon as possible: -allergic reactions like skin rash, itching or hives, swelling of the face, lips, or tongue -bone pain -breathing problems -dizziness -jaw pain, especially after dental work -redness, blistering, peeling of the skin -signs and symptoms of infection like fever or chills; cough; sore throat; pain or trouble passing urine -signs of low calcium like fast heartbeat, muscle cramps or muscle pain; pain, tingling, numbness in the hands or feet; seizures -unusual bleeding or bruising -unusually weak or tired Side effects that  usually do not require medical attention (report to your doctor or health care professional if they continue or are bothersome): -constipation -diarrhea -headache -joint pain -loss of appetite -muscle pain -runny nose -tiredness -upset stomach This list may not describe all possible side effects. Call your doctor for medical advice about side effects. You may report side effects to FDA at 1-800-FDA-1088. Where should I keep my medicine? This medicine is only given in a clinic, doctor's office, or other health care setting and will not be stored at home. NOTE: This sheet is a summary. It may not cover all possible information. If you have questions about this medicine, talk to your doctor, pharmacist, or health care provider.  2018 Elsevier/Gold Standard (2016-04-10 19:17:21)   

## 2018-02-26 ENCOUNTER — Encounter (HOSPITAL_COMMUNITY): Payer: Self-pay

## 2018-02-26 ENCOUNTER — Other Ambulatory Visit: Payer: Self-pay

## 2018-02-26 ENCOUNTER — Inpatient Hospital Stay (HOSPITAL_COMMUNITY): Payer: Medicare Other

## 2018-02-26 ENCOUNTER — Inpatient Hospital Stay (HOSPITAL_COMMUNITY)
Admission: EM | Admit: 2018-02-26 | Discharge: 2018-03-03 | DRG: 372 | Disposition: A | Discharge status: Hospice | Payer: Medicare Other | Attending: Family Medicine | Admitting: Family Medicine

## 2018-02-26 ENCOUNTER — Emergency Department (HOSPITAL_COMMUNITY): Payer: Medicare Other

## 2018-02-26 DIAGNOSIS — C799 Secondary malignant neoplasm of unspecified site: Secondary | ICD-10-CM

## 2018-02-26 DIAGNOSIS — A0471 Enterocolitis due to Clostridium difficile, recurrent: Secondary | ICD-10-CM | POA: Diagnosis not present

## 2018-02-26 DIAGNOSIS — C787 Secondary malignant neoplasm of liver and intrahepatic bile duct: Secondary | ICD-10-CM | POA: Diagnosis not present

## 2018-02-26 DIAGNOSIS — R590 Localized enlarged lymph nodes: Secondary | ICD-10-CM | POA: Diagnosis not present

## 2018-02-26 DIAGNOSIS — M109 Gout, unspecified: Secondary | ICD-10-CM | POA: Diagnosis present

## 2018-02-26 DIAGNOSIS — L89321 Pressure ulcer of left buttock, stage 1: Secondary | ICD-10-CM | POA: Diagnosis present

## 2018-02-26 DIAGNOSIS — Z515 Encounter for palliative care: Secondary | ICD-10-CM

## 2018-02-26 DIAGNOSIS — R609 Edema, unspecified: Secondary | ICD-10-CM | POA: Diagnosis present

## 2018-02-26 DIAGNOSIS — C651 Malignant neoplasm of right renal pelvis: Secondary | ICD-10-CM

## 2018-02-26 DIAGNOSIS — K219 Gastro-esophageal reflux disease without esophagitis: Secondary | ICD-10-CM | POA: Diagnosis present

## 2018-02-26 DIAGNOSIS — C786 Secondary malignant neoplasm of retroperitoneum and peritoneum: Secondary | ICD-10-CM | POA: Diagnosis not present

## 2018-02-26 DIAGNOSIS — A0472 Enterocolitis due to Clostridium difficile, not specified as recurrent: Secondary | ICD-10-CM | POA: Diagnosis not present

## 2018-02-26 DIAGNOSIS — Z681 Body mass index (BMI) 19 or less, adult: Secondary | ICD-10-CM | POA: Diagnosis not present

## 2018-02-26 DIAGNOSIS — K5120 Ulcerative (chronic) proctitis without complications: Secondary | ICD-10-CM | POA: Diagnosis present

## 2018-02-26 DIAGNOSIS — R1030 Lower abdominal pain, unspecified: Secondary | ICD-10-CM | POA: Diagnosis not present

## 2018-02-26 DIAGNOSIS — N179 Acute kidney failure, unspecified: Secondary | ICD-10-CM | POA: Diagnosis not present

## 2018-02-26 DIAGNOSIS — C649 Malignant neoplasm of unspecified kidney, except renal pelvis: Secondary | ICD-10-CM | POA: Diagnosis not present

## 2018-02-26 DIAGNOSIS — J479 Bronchiectasis, uncomplicated: Secondary | ICD-10-CM | POA: Diagnosis not present

## 2018-02-26 DIAGNOSIS — Z888 Allergy status to other drugs, medicaments and biological substances status: Secondary | ICD-10-CM | POA: Diagnosis not present

## 2018-02-26 DIAGNOSIS — Z79899 Other long term (current) drug therapy: Secondary | ICD-10-CM | POA: Diagnosis not present

## 2018-02-26 DIAGNOSIS — Z9221 Personal history of antineoplastic chemotherapy: Secondary | ICD-10-CM | POA: Diagnosis not present

## 2018-02-26 DIAGNOSIS — D72829 Elevated white blood cell count, unspecified: Secondary | ICD-10-CM | POA: Diagnosis not present

## 2018-02-26 DIAGNOSIS — I10 Essential (primary) hypertension: Secondary | ICD-10-CM | POA: Diagnosis not present

## 2018-02-26 DIAGNOSIS — Z7189 Other specified counseling: Secondary | ICD-10-CM

## 2018-02-26 DIAGNOSIS — Z95828 Presence of other vascular implants and grafts: Secondary | ICD-10-CM | POA: Diagnosis not present

## 2018-02-26 DIAGNOSIS — K56609 Unspecified intestinal obstruction, unspecified as to partial versus complete obstruction: Secondary | ICD-10-CM | POA: Diagnosis not present

## 2018-02-26 DIAGNOSIS — F5089 Other specified eating disorder: Secondary | ICD-10-CM | POA: Diagnosis present

## 2018-02-26 DIAGNOSIS — L8990 Pressure ulcer of unspecified site, unspecified stage: Secondary | ICD-10-CM

## 2018-02-26 DIAGNOSIS — Z87891 Personal history of nicotine dependence: Secondary | ICD-10-CM

## 2018-02-26 DIAGNOSIS — R1084 Generalized abdominal pain: Secondary | ICD-10-CM | POA: Diagnosis not present

## 2018-02-26 DIAGNOSIS — C659 Malignant neoplasm of unspecified renal pelvis: Secondary | ICD-10-CM | POA: Diagnosis present

## 2018-02-26 DIAGNOSIS — Z66 Do not resuscitate: Secondary | ICD-10-CM | POA: Diagnosis present

## 2018-02-26 DIAGNOSIS — Z923 Personal history of irradiation: Secondary | ICD-10-CM

## 2018-02-26 DIAGNOSIS — R112 Nausea with vomiting, unspecified: Secondary | ICD-10-CM | POA: Diagnosis not present

## 2018-02-26 DIAGNOSIS — M5030 Other cervical disc degeneration, unspecified cervical region: Secondary | ICD-10-CM | POA: Diagnosis present

## 2018-02-26 DIAGNOSIS — C7951 Secondary malignant neoplasm of bone: Secondary | ICD-10-CM | POA: Diagnosis not present

## 2018-02-26 DIAGNOSIS — K5900 Constipation, unspecified: Secondary | ICD-10-CM | POA: Diagnosis not present

## 2018-02-26 LAB — COMPREHENSIVE METABOLIC PANEL
ALT: 10 U/L (ref 0–44)
ANION GAP: 10 (ref 5–15)
AST: 13 U/L — ABNORMAL LOW (ref 15–41)
Albumin: 2.7 g/dL — ABNORMAL LOW (ref 3.5–5.0)
Alkaline Phosphatase: 43 U/L (ref 38–126)
BUN: 31 mg/dL — ABNORMAL HIGH (ref 8–23)
CHLORIDE: 99 mmol/L (ref 98–111)
CO2: 25 mmol/L (ref 22–32)
CREATININE: 1.27 mg/dL — AB (ref 0.44–1.00)
Calcium: 7.8 mg/dL — ABNORMAL LOW (ref 8.9–10.3)
GFR, EST AFRICAN AMERICAN: 43 mL/min — AB (ref >60)
GFR, EST NON AFRICAN AMERICAN: 37 mL/min — AB (ref >60)
Glucose, Bld: 152 mg/dL — ABNORMAL HIGH (ref 70–99)
POTASSIUM: 4.9 mmol/L (ref 3.5–5.1)
SODIUM: 134 mmol/L — AB (ref 135–145)
Total Bilirubin: 1 mg/dL (ref 0.3–1.2)
Total Protein: 6.3 g/dL — ABNORMAL LOW (ref 6.5–8.1)

## 2018-02-26 LAB — CBC WITH DIFFERENTIAL/PLATELET
ABS IMMATURE GRANULOCYTES: 0.05 10*3/uL (ref 0.00–0.07)
BASOS ABS: 0 10*3/uL (ref 0.0–0.1)
Basophils Relative: 0 %
Eosinophils Absolute: 0 10*3/uL (ref 0.0–0.5)
Eosinophils Relative: 0 %
HCT: 36.5 % (ref 36.0–46.0)
Hemoglobin: 11.4 g/dL — ABNORMAL LOW (ref 12.0–15.0)
IMMATURE GRANULOCYTES: 0 %
Lymphocytes Relative: 6 %
Lymphs Abs: 0.8 10*3/uL (ref 0.7–4.0)
MCH: 34.2 pg — ABNORMAL HIGH (ref 26.0–34.0)
MCHC: 31.2 g/dL (ref 30.0–36.0)
MCV: 109.6 fL — AB (ref 80.0–100.0)
Monocytes Absolute: 0.7 10*3/uL (ref 0.1–1.0)
Monocytes Relative: 6 %
NEUTROS ABS: 10.2 10*3/uL — AB (ref 1.7–7.7)
NEUTROS PCT: 88 %
NRBC: 0 % (ref 0.0–0.2)
PLATELETS: 301 10*3/uL (ref 150–400)
RBC: 3.33 MIL/uL — AB (ref 3.87–5.11)
RDW: 14.6 % (ref 11.5–15.5)
WBC: 11.7 10*3/uL — AB (ref 4.0–10.5)

## 2018-02-26 LAB — I-STAT CG4 LACTIC ACID, ED
LACTIC ACID, VENOUS: 1.22 mmol/L (ref 0.5–1.9)
Lactic Acid, Venous: 0.81 mmol/L (ref 0.5–1.9)

## 2018-02-26 LAB — LIPASE, BLOOD: Lipase: 21 U/L (ref 11–51)

## 2018-02-26 MED ORDER — HEPARIN SODIUM (PORCINE) 5000 UNIT/ML IJ SOLN
5000.0000 [IU] | Freq: Three times a day (TID) | INTRAMUSCULAR | Status: DC
  Administered 2018-02-26 – 2018-03-03 (×14): 5000 [IU] via SUBCUTANEOUS
  Filled 2018-02-26 (×15): qty 1

## 2018-02-26 MED ORDER — SODIUM CHLORIDE 0.9 % IV SOLN
INTRAVENOUS | Status: AC
  Administered 2018-02-26 – 2018-02-27 (×2): via INTRAVENOUS

## 2018-02-26 MED ORDER — SODIUM CHLORIDE 0.9 % IV SOLN
Freq: Once | INTRAVENOUS | Status: AC
  Administered 2018-02-26: 13:00:00 via INTRAVENOUS

## 2018-02-26 MED ORDER — SODIUM CHLORIDE 0.9 % IV BOLUS
500.0000 mL | Freq: Once | INTRAVENOUS | Status: AC
  Administered 2018-02-26: 500 mL via INTRAVENOUS

## 2018-02-26 MED ORDER — PANTOPRAZOLE SODIUM 40 MG IV SOLR
40.0000 mg | Freq: Once | INTRAVENOUS | Status: AC
  Administered 2018-02-26: 40 mg via INTRAVENOUS
  Filled 2018-02-26: qty 40

## 2018-02-26 MED ORDER — TRIAMCINOLONE ACETONIDE 0.5 % EX OINT
1.0000 "application " | TOPICAL_OINTMENT | Freq: Two times a day (BID) | CUTANEOUS | Status: DC
  Administered 2018-02-26 – 2018-03-02 (×7): 1 via TOPICAL
  Filled 2018-02-26: qty 15

## 2018-02-26 MED ORDER — MIRTAZAPINE 15 MG PO TABS
15.0000 mg | ORAL_TABLET | Freq: Every day | ORAL | Status: DC
  Administered 2018-02-26 – 2018-03-02 (×5): 15 mg via ORAL
  Filled 2018-02-26 (×5): qty 1

## 2018-02-26 MED ORDER — DIATRIZOATE MEGLUMINE & SODIUM 66-10 % PO SOLN
90.0000 mL | Freq: Once | ORAL | Status: DC
  Filled 2018-02-26: qty 90

## 2018-02-26 MED ORDER — AMLODIPINE BESYLATE 5 MG PO TABS
5.0000 mg | ORAL_TABLET | Freq: Every day | ORAL | Status: DC
  Administered 2018-02-27 – 2018-03-03 (×3): 5 mg via ORAL
  Filled 2018-02-26 (×5): qty 1

## 2018-02-26 NOTE — ED Notes (Signed)
Hospitalist made aware pt is back in her room

## 2018-02-26 NOTE — Progress Notes (Signed)
Attempted to place NG tube by two different nurses.  Patient refused any more attempts at this time.  Hospitalist notified.  Surgery paged.   Virginia Rochester, RN

## 2018-02-26 NOTE — ED Notes (Signed)
Bed: WA17 Expected date:  Expected time:  Means of arrival:  Comments: EMS-abdominal pain

## 2018-02-26 NOTE — ED Notes (Addendum)
Per Pfeiffer MD: Hold NG tube at this time. General Surgery to consult and order NG tube if needed.

## 2018-02-26 NOTE — ED Triage Notes (Signed)
Pt arrives via [facility] from home. Pt has hx of metastatic cancer. Pt reports abd pain, n/v for 2 days. Pt not actively receiving chemo at this time. Pt reports constipation. Pt reports she was suffering with diarrhea for 6 months and her PCP gave an injection to stop the diarrhea on Friday. Pt reports she has not had a good BM since.

## 2018-02-26 NOTE — ED Notes (Signed)
ED TO INPATIENT HANDOFF REPORT  Name/Age/Gender Randol Kern 84 y.o. female  Code Status Code Status History    Date Active Date Inactive Code Status Order ID Comments User Context   07/17/2017 1717 07/26/2017 1443 DNR [id number]  Kayleen Memos, DO ED   10/16/2014 2010 10/18/2014 1402 Full Code [id number]  Etta Quill, DO ED    Questions for Most Recent Historical Code Status (Order [id number])    Question Answer Comment   In the event of cardiac or respiratory ARREST Do not call a "code blue"    In the event of cardiac or respiratory ARREST Do not perform Intubation, CPR, defibrillation or ACLS    In the event of cardiac or respiratory ARREST Use medication by any route, position, wound care, and other measures to relive pain and suffering. May use oxygen, suction and manual treatment of airway obstruction as needed for comfort.         Advance Directive Documentation     Most Recent Value  Type of Advance Directive  Healthcare Power of Attorney  Pre-existing out of facility DNR order (yellow form or pink MOST form)  -  "MOST" Form in Place?  -      Home/SNF/Other Home  Chief Complaint abd pain  Level of Care/Admitting Diagnosis ED Disposition    ED Disposition Condition [place]: [place] [[id number]]  Level of Care: Med-Surg [16]  Diagnosis: SBO (small bowel obstruction) [facility]) [[id number]]  Admitting Physician: Desiree Hane [[id number]]  Attending Physician: Desiree Hane [phone]  Estimated length of stay: past midnight tomorrow  Certification:: I certify this patient will need inpatient services for at least 2 midnights  PT Class (Do Not Modify): Inpatient [101]  PT Acc Code (Do Not Modify): Private [1]       Medical History Past Medical History:  Diagnosis Date  . Arthritis     knees 03-10-12 had Cortisone injection  . Cancer [facility]) june 2016   metastatic  . DDD (degenerative disc disease), cervical   . Edema leg     feet and ankles  . Esophagus disorder    Had esophagus stretched  . GERD (gastroesophageal reflux disease)    Benign stricture dilated in 2011  . Gout    Patient notes she has possible gout but has not been on chronic medications for this  . Headache(784.0)   . Hypertension   . Occipital neuralgia   . Occipital neuralgia    Related to cervical degenerative disc disease  . Peptic ulcer disease    Previous history in the remote past  . Ulcerative proctitis ([place])   . Ulcerative proctitis ([place]) 2014    Allergies Allergies  Allergen Reactions  . Nitrofurantoin Other (See Comments)    Trembling  . Indomethacin Swelling and Other (See Comments)    Tongue swelling  . Lisinopril Swelling and Other (See Comments)    Tongue swelling    IV Location/Drains/Wounds Patient Lines/Drains/Airways Status   Active Line/Drains/Airways    Name:   Placement date:   Placement time:   Site:   Days:   Implanted Port 01/03/15 Right Chest   01/03/15    1236    Chest   1150          Labs/Imaging Results for orders placed or performed during the hospital encounter of 02/26/18 (from the past 48 hour(s))  Comprehensive metabolic panel     Status: Abnormal   Collection Time: 02/26/18  9:08 AM  Result Value Ref Range   Sodium 134 (L) 135 - 145 mmol/L   Potassium 4.9 3.5 - 5.1 mmol/L   Chloride 99 98 - 111 mmol/L   CO2 25 22 - 32 mmol/L   Glucose, Bld 152 (H) 70 - 99 mg/dL   BUN 31 (H) 8 - 23 mg/dL   Creatinine, Ser 1.27 (H) 0.44 - 1.00 mg/dL   Calcium 7.8 (L) 8.9 - 10.3 mg/dL   Total Protein 6.3 (L) 6.5 - 8.1 g/dL   Albumin 2.7 (L) 3.5 - 5.0 g/dL   AST 13 (L) 15 - 41 U/L   ALT 10 0 - 44 U/L   Alkaline Phosphatase 43 38 - 126 U/L   Total Bilirubin 1.0 0.3 - 1.2 mg/dL   GFR calc non Af Amer 37 (L) >60 mL/min   GFR calc Af Amer 43 (L) >60 mL/min   Anion gap 10 5 - 15    Comment: Performed at [facility], [place] [address] [place], [place] [address]  Lipase, blood      Status: None   Collection Time: 02/26/18  9:08 AM  Result Value Ref Range   Lipase 21 11 - 51 U/L    Comment: Performed at [facility], [place] [address], [place], [place] [address]  CBC with Differential     Status: Abnormal   Collection Time: 02/26/18  9:08 AM  Result Value Ref Range   WBC 11.7 (H) 4.0 - 10.5 K/uL   RBC 3.33 (L) 3.87 - 5.11 MIL/uL   Hemoglobin 11.4 (L) 12.0 - 15.0 g/dL   HCT 36.5 36.0 - 46.0 %   MCV 109.6 (H) 80.0 - 100.0 fL   MCH 34.2 (H) 26.0 - 34.0 pg   MCHC 31.2 30.0 - 36.0 g/dL   RDW 14.6 11.5 - 15.5 %   Platelets 301 150 - 400 K/uL   nRBC 0.0 0.0 - 0.2 %   Neutrophils Relative % 88 %    Comment: VACUOLATED NEUTROPHILS   Neutro Abs 10.2 (H) 1.7 - 7.7 K/uL   Lymphocytes Relative 6 %   Lymphs Abs 0.8 0.7 - 4.0 K/uL   Monocytes Relative 6 %   Monocytes Absolute 0.7 0.1 - 1.0 K/uL   Eosinophils Relative 0 %   Eosinophils Absolute 0.0 0.0 - 0.5 K/uL   Basophils Relative 0 %   Basophils Absolute 0.0 0.0 - 0.1 K/uL   Immature Granulocytes 0 %   Abs Immature Granulocytes 0.05 0.00 - 0.07 K/uL   Polychromasia PRESENT     Comment: Performed at [facility], [place] [address], [place], [place] [address]  I-Stat CG4 Lactic Acid, ED     Status: None   Collection Time: 02/26/18  9:09 AM  Result Value Ref Range   Lactic Acid, Venous 1.22 0.5 - 1.9 mmol/L  I-Stat CG4 Lactic Acid, ED     Status: None   Collection Time: 02/26/18 11:12 AM  Result Value Ref Range   Lactic Acid, Venous 0.81 0.5 - 1.9 mmol/L   Ct Abdomen Pelvis Wo Contrast  Result Date: 02/26/2018 CLINICAL DATA:  Nausea and vomiting. EXAM: CT ABDOMEN AND PELVIS WITHOUT CONTRAST TECHNIQUE: Multidetector CT imaging of the abdomen and pelvis was performed following the standard protocol without IV contrast. COMPARISON:  PET-CT 02/14/2018 FINDINGS: Lower chest: No acute abnormality. Hepatobiliary: No focal liver abnormality is seen. Status post cholecystectomy. No biliary  dilatation. Pancreas: Mild diffuse atrophy of the pancreas. No mass or  inflammation. Spleen: Normal in size without focal abnormality. Adrenals/Urinary Tract: Normal adrenal glands. Large necrotic mass within the right abdomen compatible with known urothelial neoplasm is again noted measuring 11.2 by 9.5 cm, image 41/2. Soft tissue extending into the right ureter is again noted. Associated mass within the iliac fossa appears similar to previous study measuring 7.1 by 5.7 cm. Unremarkable appearance of the left kidney. Urinary bladder appears within normal limits. Stomach/Bowel: The stomach appears normal. There is abnormal increase caliber of the small bowel loops which measure up to 3.1 cm and contain multiple air-fluid levels. Transition to normal to decreased caliber mid and distal small bowel loops identified within the left lower quadrant of the abdomen, image 63/2. Unremarkable appearance of the terminal ileum and colon. Vascular/Lymphatic: Aortic atherosclerosis. No aneurysm. Extensive branch vessel disease noted. No adenopathy within the abdomen or pelvis. Reproductive: The uterus is not visualized and may be surgically absent or atrophic. Other: There is a small volume of ascites within the abdomen and pelvis. Increased from previous exam. Musculoskeletal: Multifocal areas of abnormal sclerosis are identified compatible with sclerotic bone metastases. IMPRESSION: 1. Abnormal proximal small bowel dilatation with left lower quadrant transition to normal caliber small bowel loops. Findings concerning for bowel obstruction. Findings may be due to adhesive disease. Alternatively peritoneal metastasis in a patient with known malignancy could result in bowel obstruction. 2. Similar appearance of large hypermetabolic right kidney mass with ureteral involvement and associated large right iliac fossa tumor. 3. Mild increase in volume of ascites. 4. Multifocal sclerotic bone lesions. These were not hypermetabolic on  CT from 95/28/4132 and may reflect areas of treated bone metastasis. 5.  Aortic Atherosclerosis (ICD10-I70.0). Electronically Signed   By: Kerby Moors M.D.   On: 02/26/2018 12:55   Dg Abd Acute W/chest  Result Date: 02/26/2018 CLINICAL DATA:  Mid abdominal pain with nausea and vomiting. Constipation EXAM: DG ABDOMEN ACUTE W/ 1V CHEST COMPARISON:  Head CT 02/14/2018 FINDINGS: Porta catheter with tip at the upper cavoatrial junction. There is no edema, consolidation, effusion, or pneumothorax. Normal heart size. Dilated small bowel compatible with obstruction. There is known intra-abdominal malignancy. No pneumoperitoneum or visible pneumatosis. IMPRESSION: Small bowel obstruction in this patient with known intra-abdominal malignancy. Electronically Signed   By: Monte Fantasia M.D.   On: 02/26/2018 09:27    Pending Labs Unresulted Labs (From admission, onward)    Start     Ordered   02/26/18 0840  Urinalysis, Routine w reflex microscopic  ONCE - STAT,   STAT     02/26/18 0840   Signed and Held  Creatinine, serum  (heparin)  Once,   R    Comments:  Baseline for heparin therapy IF NOT ALREADY DRAWN.    Signed and Held   Signed and Held  Basic metabolic panel  Tomorrow morning,   R     Signed and Held   Signed and Held  CBC  Tomorrow morning,   R     Signed and Held          Vitals/Pain Today's Vitals   02/26/18 1300 02/26/18 1305 02/26/18 1315 02/26/18 1330  BP: 131/70 131/70  135/74  Pulse: 79 81 76 78  Resp:  16    Temp:      TempSrc:      SpO2: 96% 95% 96% 95%  PainSc:        Isolation Precautions No active isolations  Medications Medications  diatrizoate meglumine-sodium (GASTROGRAFIN) 66-10 % solution 90 mL (has no  administration in time range)  sodium chloride 0.9 % bolus 500 mL (0 mLs Intravenous Stopped 02/26/18 1038)  0.9 %  sodium chloride infusion ( Intravenous Transfusing/Transfer 02/26/18 1340)  pantoprazole (PROTONIX) injection 40 mg (40 mg Intravenous  Given 02/26/18 1241)    Mobility walks with device

## 2018-02-26 NOTE — ED Provider Notes (Signed)
[place] DEPT Provider Note   CSN: [id number] Arrival date & time: 02/26/18  0813     History   Chief Complaint Chief Complaint  Patient presents with  . Abdominal Pain  . Constipation    HPI Tiffany [name] is a 84 y.o. female.  HPI Patient reports that she only learned of her results of her PET scan 6 days ago which identified that she has 2 fairly large pelvic masses as well as fairly extensive metastatic disease.  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  She has also been suffering from intractable C. difficile for which she has had several courses of treatment.  She had prior history as well of Salmonella enteritis with bacteremia.  Patient reports that she has been having worsening pain in her lower abdomen and right lower quadrant for about 2 to 3 days.  She reports that she has been having constipation.  She reports that she had a very small bowel movement today but she has to strain and push.  However, she has had almost 6 months of dealing with intractable diarrhea.  No fever.  She reports that she did have a couple episodes of vomiting however.  She reports she vomited one large amount last night and then today trying to take in fluids she vomited again.  Patient reports she just feels generally very weak.  She denies she is taking anything specifically for pain regarding her intra-abdominal masses. Past Medical History:  Diagnosis Date  . Arthritis     knees 03-10-12 had Cortisone injection  . Cancer [facility]) june 2016   metastatic  . DDD (degenerative disc disease), cervical   . Edema leg    feet and ankles  . Esophagus disorder    Had esophagus stretched  . GERD (gastroesophageal reflux disease)    Benign stricture dilated in 2011  . Gout    Patient notes she has possible gout but has not been on chronic medications for this  .  Headache(784.0)   . Hypertension   . Occipital neuralgia   . Occipital neuralgia    Related to cervical degenerative disc disease  . Peptic ulcer disease    Previous history in the remote past  . Ulcerative proctitis ([place])   . Ulcerative proctitis [facility]) 2014    Patient Active Problem List   Diagnosis Date Noted  . SBO (small bowel obstruction) ([place]) 02/26/2018  . Diarrhea in adult patient 07/17/2017  . Port catheter in place 07/21/2015  . Bronchiolitis 07/01/2015  . Bronchiectasis ([place]) with probable Ass[facility] MAI  06/10/2015  . Liver metastases ([place]) 05/22/2015  . Bone metastases ([place]) 05/22/2015  . Bronchopneumonia 04/29/2015  . Insomnia 04/08/2015  . Transitional cell carcinoma of renal pelvis ([place]) 11/22/2014    Past Surgical History:  Procedure Laterality Date  . ABDOMINAL HYSTERECTOMY    . APPENDECTOMY    . CHOLECYSTECTOMY    . COLONOSCOPY WITH PROPOFOL  03/25/2012   Procedure: COLONOSCOPY WITH PROPOFOL;  Surgeon: Garlan Fair, MD;  Location: [facility] END[facility]COPY;  Service: Endoscopy;  Laterality: N/A;  . DILATION AND CURETTAGE OF UTERUS    . ESOPHAGEAL DILATION     For benign stricture in 2011  . ESOPHAGOGASTRODUODENOSCOPY    . FLEXIBLE SIGMOIDOSCOPY N/A 04/26/2014   Procedure: FLEXIBLE SIGMOIDOSCOPY - UnSedated;  Surgeon: Garlan Fair, MD;  Location: [facility];  Service: Endoscopy;  Laterality: N/A;  . FLEXIBLE SIGMOIDOSCOPY N/A  01/24/2015   Procedure: FLEXIBLE SIGNMOIDOSCOPY W/ FLEET ENEMIA;  Surgeon: Garlan Fair, MD;  Location: [facility];  Service: Endoscopy;  Laterality: N/A;  . FLEXIBLE SIGMOIDOSCOPY N/A 05/15/2016   Procedure: FLEXIBLE SIGMOIDOSCOPY;  Surgeon: Garlan Fair, MD;  Location: [facility];  Service: Endoscopy;  Laterality: N/A;  pt needs to ahve an enema upon arrival   . NECK SURGERY     20 years ago  . TOTAL ABDOMINAL HYSTERECTOMY W/ BILATERAL SALPINGOOPHORECTOMY     At age 61 due to miscarriage and retained products of conception  causing significant uterine bleeding. Patient has been on estrogen replacement therapy since then.     OB History   None      Home Medications    Prior to Admission medications   Medication Sig Start Date End Date Taking? Authorizing Provider  acetaminophen (TYLENOL) 650 MG CR tablet Take 1,300 mg by mouth 2 (two) times daily.   Yes [provider]  amLODipine (NORVASC) 5 MG tablet Take 5 mg by mouth daily.   Yes [provider]  estradiol (ESTRACE) 0.5 MG tablet Take 0.5 mg by mouth daily. 08/02/14  Yes [provider]  lactose free nutrition (BOOST) LIQD Take 237 mLs by mouth daily.   Yes [provider]  mirtazapine (REMERON) 15 MG tablet TAKE 1 TABLET(15 MG) BY MOUTH AT BEDTIME Patient taking differently: Take 15 mg by mouth at bedtime.  01/07/18  Yes [name], MD  Probiotic Product (PROBIOTIC DAILY PO) Take 1 capsule by mouth every evening.    Yes [provider]  triamcinolone ointment (KENALOG) 0.5 % APPLY EXTERNALLY TO THE AFFECTED AREA TWICE DAILY Patient taking differently: Apply 1 application topically 2 (two) times daily.  09/23/17  Yes [name], MD  vitamin B-12 (CYANOCOBALAMIN) 100 MCG tablet Take 100 mcg by mouth daily.   Yes [provider]  cholestyramine (QUESTRAN) 4 g packet [place] 1 PACKET(4 GRAMS) BY MOUTH TWICE DAILY Patient not taking: Reported on 12/04/2017 11/07/17   [name], MD  feeding supplement, ENSURE ENLIVE, (ENSURE ENLIVE) LIQD Take 237 mLs by mouth 2 (two) times daily between meals. Patient not taking: Reported on 02/26/2018 07/26/17   Aline August, MD  fidaxomicin (DIFICID) 200 MG TABS tablet Take 1 tablet (200 mg total) by mouth 2 (two) times daily. Patient not taking: Reported on 02/26/2018 11/21/17   Carlyle Basques, MD  furosemide (LASIX) 20 MG tablet Take 1 tablet (20 mg total) by mouth daily for 3 days. Patient not taking: Reported on 02/26/2018 07/28/17  07/31/17  Gareth Morgan, MD  iron polysaccharides (NIFEREX) 150 MG capsule Take 1 capsule (150 mg total) by mouth daily. Patient not taking: Reported on 09/27/2017 07/04/17   [name], MD  ondansetron (ZOFRAN ODT) 4 MG disintegrating tablet Take 1 tablet (4 mg total) by mouth every 8 (eight) hours as needed for nausea or vomiting. Patient not taking: Reported on 02/26/2018 07/26/17   Aline August, MD  Respiratory Therapy Supplies (FLUTTER) [place] 1 each by Does not apply route daily. 07/06/15   Tanda Rockers, MD  sulfamethoxazole-trimethoprim (BACTRIM DS,SEPTRA DS) 800-160 MG tablet Take 1 tablet by mouth 2 (two) times daily. Patient not taking: Reported on 02/26/2018 12/20/17   Carlyle Basques, MD  vancomycin (VANCOCIN) 125 MG capsule Take 1 capsule (125 mg total) by mouth 4 (four) times daily. Patient not taking: Reported on 11/18/2017 10/28/17   Carlyle Basques, MD    Family History Family History  Problem Relation Age of Onset  . Lung cancer Sister   . Prostate cancer Brother   . Uterine cancer Maternal Aunt   . Breast cancer Paternal Grandmother   . Hodgkin's lymphoma Sister     Social History Social History   Tobacco Use  . Smoking status: Former Smoker    Last attempt to quit: 11/01/1983    Years since quitting: 34.3  . Smokeless tobacco: Never Used  Substance Use Topics  . Alcohol use: Yes    Comment: occ  . Drug use: No     Allergies   Nitrofurantoin; Indomethacin; and Lisinopril   Review of Systems Review of Systems 10 Systems reviewed and are negative for acute change except as noted in the HPI.   Physical Exam Updated Vital Signs BP 131/70   Pulse 81   Temp 98.6 F (37 C) (Oral)   Resp 16   SpO2 95%   Physical Exam  Constitutional: She is oriented to person, place, and time.  Patient is alert and has surprisingly well clinical appearance for age and comorbid conditions.  She is bright and nontoxic.  No respiratory distress.  HENT:  Upper  dentures.  Tongue has plaque suggestive of Candida.  Posterior airway is widely patent.  Eyes: EOM are normal.  Neck: Neck supple.  Cardiovascular: Normal rate, regular rhythm, normal heart sounds and intact distal pulses.  Pulmonary/Chest: Effort normal and breath sounds normal.  Abdominal:  Abdomen is mildly protuberant.  Soft without guarding.  She endorses pain to palpation in the right mid lower and central quadrant.  No guarding.  Musculoskeletal:  Patient has edema of the right lower extremity.  She has a compression dressing that is clean and dry.  The left lower extremity is without calf tenderness or significant edema.  Warm and dry.  Neurological: She is alert and oriented to person, place, and time. No cranial nerve deficit. She exhibits normal muscle tone. Coordination normal.  Skin: Skin is warm and dry. There is pallor.  Psychiatric: She has a normal mood and affect.     ED Treatments / Results  Labs (all labs ordered are listed, but only abnormal results are displayed) Labs Reviewed  COMPREHENSIVE METABOLIC PANEL - Abnormal; Notable for the following components:      Result Value   Sodium 134 (*)    Glucose, Bld 152 (*)    BUN 31 (*)    Creatinine, Ser 1.27 (*)    Calcium 7.8 (*)    Total Protein 6.3 (*)    Albumin 2.7 (*)    AST 13 (*)    GFR calc non Af Amer 37 (*)    GFR calc Af Amer 43 (*)    All other components within normal limits  CBC WITH DIFFERENTIAL/PLATELET - Abnormal; Notable for the following components:   WBC 11.7 (*)    RBC 3.33 (*)    Hemoglobin 11.4 (*)    MCV 109.6 (*)    MCH 34.2 (*)    Neutro Abs 10.2 (*)    All other components within normal limits  LIPASE, BLOOD  URINALYSIS, ROUTINE W REFLEX MICROSCOPIC  I-STAT CG4 LACTIC ACID, ED  I-STAT CG4 LACTIC ACID, ED  POC OCCULT BLOOD, ED    EKG None  Radiology Ct Abdomen Pelvis Wo Contrast  Result Date: 02/26/2018 CLINICAL DATA:  Nausea and vomiting. EXAM: CT ABDOMEN AND PELVIS  WITHOUT CONTRAST TECHNIQUE: Multidetector CT imaging of the abdomen and pelvis was performed following the standard protocol without  IV contrast. COMPARISON:  PET-CT 02/14/2018 FINDINGS: Lower chest: No acute abnormality. Hepatobiliary: No focal liver abnormality is seen. Status post cholecystectomy. No biliary dilatation. Pancreas: Mild diffuse atrophy of the pancreas. No mass or inflammation. Spleen: Normal in size without focal abnormality. Adrenals/Urinary Tract: Normal adrenal glands. Large necrotic mass within the right abdomen compatible with known urothelial neoplasm is again noted measuring 11.2 by 9.5 cm, image 41/2. Soft tissue extending into the right ureter is again noted. Associated mass within the iliac fossa appears similar to previous study measuring 7.1 by 5.7 cm. Unremarkable appearance of the left kidney. Urinary bladder appears within normal limits. Stomach/Bowel: The stomach appears normal. There is abnormal increase caliber of the small bowel loops which measure up to 3.1 cm and contain multiple air-fluid levels. Transition to normal to decreased caliber mid and distal small bowel loops identified within the left lower quadrant of the abdomen, image 63/2. Unremarkable appearance of the terminal ileum and colon. Vascular/Lymphatic: Aortic atherosclerosis. No aneurysm. Extensive branch vessel disease noted. No adenopathy within the abdomen or pelvis. Reproductive: The uterus is not visualized and may be surgically absent or atrophic. Other: There is a small volume of ascites within the abdomen and pelvis. Increased from previous exam. Musculoskeletal: Multifocal areas of abnormal sclerosis are identified compatible with sclerotic bone metastases. IMPRESSION: 1. Abnormal proximal small bowel dilatation with left lower quadrant transition to normal caliber small bowel loops. Findings concerning for bowel obstruction. Findings may be due to adhesive disease. Alternatively peritoneal metastasis in a  patient with known malignancy could result in bowel obstruction. 2. Similar appearance of large hypermetabolic right kidney mass with ureteral involvement and associated large right iliac fossa tumor. 3. Mild increase in volume of ascites. 4. Multifocal sclerotic bone lesions. These were not hypermetabolic on CT from 71/69/6789 and may reflect areas of treated bone metastasis. 5.  Aortic Atherosclerosis (ICD10-I70.0). Electronically Signed   By: Kerby Moors M.D.   On: 02/26/2018 12:55   Dg Abd Acute W/chest  Result Date: 02/26/2018 CLINICAL DATA:  Mid abdominal pain with nausea and vomiting. Constipation EXAM: DG ABDOMEN ACUTE W/ 1V CHEST COMPARISON:  Head CT 02/14/2018 FINDINGS: Porta catheter with tip at the upper cavoatrial junction. There is no edema, consolidation, effusion, or pneumothorax. Normal heart size. Dilated small bowel compatible with obstruction. There is known intra-abdominal malignancy. No pneumoperitoneum or visible pneumatosis. IMPRESSION: Small bowel obstruction in this patient with known intra-abdominal malignancy. Electronically Signed   By: Monte Fantasia M.D.   On: 02/26/2018 09:27    Procedures Procedures (including critical care time)  Medications Ordered in ED Medications  diatrizoate meglumine-sodium (GASTROGRAFIN) 66-10 % solution 90 mL (has no administration in time range)  sodium chloride 0.9 % bolus 500 mL (0 mLs Intravenous Stopped 02/26/18 1038)  0.9 %  sodium chloride infusion ( Intravenous New Bag/Given 02/26/18 1241)  pantoprazole (PROTONIX) injection 40 mg (40 mg Intravenous Given 02/26/18 1241)     Initial Impression / Assessment and Plan / ED Course  I have reviewed the triage vital signs and the nursing notes.  Pertinent labs & imaging results that were available during my care of the patient were reviewed by me and considered in my medical decision making (see chart for details).  Clinical Course as of Feb 27 1315  Wed Feb 26, 2018  1108  Consult to Surgery reordered. Consult to oncology ordered.   [MP]  3810 Consult: Reviewed with [facility] of general surgery.  Patient in consult.  Agree with proceeding with  NG tube.   [MP]    Clinical Course User Index [MP] Charlesetta Shanks, MD   Patient presents as outlined above.  CT shows probable small bowel obstruction.  Patient had known pre-existing significant large pelvic masses from renal cancer.  Patient reports that she vomited last night and today has not been tolerating fluids.  Patient is alert with clear mental status.  She does not show signs of acute distress.  She is clinically much better in appearance and her underlying conditions would suggest.  Her abdomen is soft without guarding.  Patient is admitted to hospitalist service with consultation to general surgery.  Final Clinical Impressions(s) / ED Diagnoses   Final diagnoses:  SBO (small bowel obstruction) ([place])  Metastatic cancer [facility])    ED Discharge Orders    None       Charlesetta Shanks, MD 02/26/18 1318

## 2018-02-26 NOTE — Progress Notes (Signed)
Unable to place NG, will hold off on SBP repeat xray ordered for the AM.  I told nursing they could ask IR to place NG if she starts having nausea.

## 2018-02-26 NOTE — ED Notes (Signed)
Per MD: Hold NG tube placement until after pt receives CT scan

## 2018-02-26 NOTE — Consult Note (Addendum)
[facility] Surgery Consult Note  AMSI GRIMLEY [birth year]  [id number].    Requesting MD: Charlesetta Shanks Chief Complaint/Reason for Consult: SBO  HPI:  Tiffany [name] is a 84yo female PMH metastatic transitional cell carcinoma of right renal pelvis recently undergoing palliative chemotherapy (Atezolizumab and Delton See) and radiation under the care of Dr. Irene Limbo and Dr. Tammi Klippel, however PET scan 11/15 showed worsening disease therefore it was decided not to continue chemotherapy. Patient presented to [facility] today with 2 days of worsening abdominal pain. States that she has been suffering with diarrhea for 6+ months from c diff, salmonella enteritis, inflammatory proctitis prior to beginning immunotherapy, radiation therapy, and/or immunotherapy itself; she has been on multiple treatment regiments including oral vancomycin, fidaxomicin, bactrim, and mesalamine suppositories but diarrhea persists. States that her PCP gave her a sandostatin injection 11/22 to help stop the diarrhea. Diarrhea has slowed down, but then she started having diffuse abdominal pain 2 days ago. Pain is constant. She reports abdominal bloating and several episodes of n/v. She did have a small BM this morning and is passing flatus today. Abdominal xray showed SBO and general surgery was asked to see.  -Abdominal surgical history: hysterectomy with bilateral salpingoophorectomy, appendectomy, cholecystectomy -Anticoagulants: none -Nonsmoker -Lives at home with her husband -Ambulates with a cane  ROS: Review of Systems  Constitutional: Positive for malaise/fatigue. Negative for chills and fever.  HENT: Negative.   Eyes: Negative.   Respiratory: Negative.   Cardiovascular: Positive for leg swelling. Negative for chest pain.  Gastrointestinal: Positive for abdominal pain, diarrhea, nausea and vomiting.  Genitourinary: Negative.   Musculoskeletal: Positive for joint pain.  Skin: Negative.   Neurological: Negative.    Psychiatric/Behavioral: Negative.    All systems reviewed and otherwise negative except for as above  Family History  Problem Relation Age of Onset  . Lung cancer Sister   . Prostate cancer Brother   . Uterine cancer Maternal Aunt   . Breast cancer Paternal Grandmother   . Hodgkin's lymphoma Sister     Past Medical History:  Diagnosis Date  . Arthritis     knees 03-10-12 had Cortisone injection  . Cancer [facility]) june 2016   metastatic  . DDD (degenerative disc disease), cervical   . Edema leg    feet and ankles  . Esophagus disorder    Had esophagus stretched  . GERD (gastroesophageal reflux disease)    Benign stricture dilated in 2011  . Gout    Patient notes she has possible gout but has not been on chronic medications for this  . Headache(784.0)   . Hypertension   . Occipital neuralgia   . Occipital neuralgia    Related to cervical degenerative disc disease  . Peptic ulcer disease    Previous history in the remote past  . Ulcerative proctitis ([place])   . Ulcerative proctitis ([place]) 2014    Past Surgical History:  Procedure Laterality Date  . ABDOMINAL HYSTERECTOMY    . APPENDECTOMY    . CHOLECYSTECTOMY    . COLONOSCOPY WITH PROPOFOL  03/25/2012   Procedure: COLONOSCOPY WITH PROPOFOL;  Surgeon: Garlan Fair, MD;  Location: [facility] ENDOSCOPY;  Service: Endoscopy;  Laterality: N/A;  . DILATION AND CURETTAGE OF UTERUS    . ESOPHAGEAL DILATION     For benign stricture in 2011  . ESOPHAGOGASTRODUODENOSCOPY    . FLEXIBLE SIGMOIDOSCOPY N/A 04/26/2014   Procedure: FLEXIBLE SIGMOIDOSCOPY - UnSedated;  Surgeon: Garlan Fair, MD;  Location: [facility] ENDOSCOPY;  Service: Endoscopy;  Laterality:  N/A;  . FLEXIBLE SIGMOIDOSCOPY N/A 01/24/2015   Procedure: FLEXIBLE SIGNMOIDOSCOPY W/ FLEET ENEMIA;  Surgeon: Garlan Fair, MD;  Location: [facility] ENDOSCOPY;  Service: Endoscopy;  Laterality: N/A;  . FLEXIBLE SIGMOIDOSCOPY N/A 05/15/2016   Procedure: FLEXIBLE SIGMOIDOSCOPY;  Surgeon: Garlan Fair, MD;  Location: [facility] ENDOSCOPY;  Service: Endoscopy;  Laterality: N/A;  pt needs to ahve an enema upon arrival   . NECK SURGERY     20 years ago  . TOTAL ABDOMINAL HYSTERECTOMY W/ BILATERAL SALPINGOOPHORECTOMY     At age 52 due to miscarriage and retained products of conception causing significant uterine bleeding. Patient has been on estrogen replacement therapy since then.    Social History:  reports that she quit smoking about 34 years ago. She has never used smokeless tobacco. She reports that she drinks alcohol. She reports that she does not use drugs.  Allergies:  Allergies  Allergen Reactions  . Nitrofurantoin Other (See Comments)    Trembling  . Indomethacin Swelling and Other (See Comments)    Tongue swelling  . Lisinopril Swelling and Other (See Comments)    Tongue swelling     (Not in a hospital admission)  Prior to Admission medications   Medication Sig Start Date End Date Taking? Authorizing Provider  acetaminophen (TYLENOL) 650 MG CR tablet Take 1,300 mg by mouth 2 (two) times daily.   Yes [provider]  amLODipine (NORVASC) 5 MG tablet Take 5 mg by mouth daily.   Yes [provider]  estradiol (ESTRACE) 0.5 MG tablet Take 0.5 mg by mouth daily. 08/02/14  Yes [provider]  lactose free nutrition (BOOST) LIQD Take 237 mLs by mouth daily.   Yes [provider]  mirtazapine (REMERON) 15 MG tablet TAKE 1 TABLET(15 MG) BY MOUTH AT BEDTIME Patient taking differently: Take 15 mg by mouth at bedtime.  01/07/18  Yes [name], MD  Probiotic Product (PROBIOTIC DAILY PO) Take 1 capsule by mouth every evening.    Yes [provider]  triamcinolone ointment (KENALOG) 0.5 % APPLY EXTERNALLY TO THE AFFECTED AREA TWICE DAILY Patient taking differently: Apply 1 application topically 2 (two) times daily.  09/23/17  Yes [name], MD  vitamin B-12 (CYANOCOBALAMIN) 100 MCG tablet Take 100 mcg by mouth daily.   Yes  [provider]  cholestyramine (QUESTRAN) 4 g packet [place] 1 PACKET(4 GRAMS) BY MOUTH TWICE DAILY Patient not taking: Reported on 12/04/2017 11/07/17   [name], MD  feeding supplement, ENSURE ENLIVE, (ENSURE ENLIVE) LIQD Take 237 mLs by mouth 2 (two) times daily between meals. Patient not taking: Reported on 02/26/2018 07/26/17   Aline August, MD  fidaxomicin (DIFICID) 200 MG TABS tablet Take 1 tablet (200 mg total) by mouth 2 (two) times daily. Patient not taking: Reported on 02/26/2018 11/21/17   Carlyle Basques, MD  furosemide (LASIX) 20 MG tablet Take 1 tablet (20 mg total) by mouth daily for 3 days. Patient not taking: Reported on 02/26/2018 07/28/17 07/31/17  Gareth Morgan, MD  iron polysaccharides (NIFEREX) 150 MG capsule Take 1 capsule (150 mg total) by mouth daily. Patient not taking: Reported on 09/27/2017 07/04/17   [name], MD  ondansetron (ZOFRAN ODT) 4 MG disintegrating tablet Take 1 tablet (4 mg total) by mouth every 8 (eight) hours as needed for nausea or vomiting. Patient not taking: Reported on 02/26/2018 07/26/17   Aline August, MD  Respiratory Therapy Supplies (FLUTTER) [place] 1 each by Does not apply  route daily. 07/06/15   Tanda Rockers, MD  sulfamethoxazole-trimethoprim (BACTRIM DS,SEPTRA DS) 800-160 MG tablet Take 1 tablet by mouth 2 (two) times daily. Patient not taking: Reported on 02/26/2018 12/20/17   Carlyle Basques, MD  vancomycin (VANCOCIN) 125 MG capsule Take 1 capsule (125 mg total) by mouth 4 (four) times daily. Patient not taking: Reported on 11/18/2017 10/28/17   Carlyle Basques, MD    Blood pressure 128/67, pulse 75, temperature 98.6 F (37 C), temperature source Oral, resp. rate 17, SpO2 96 %. Physical Exam: General: pleasant, frail appearing white female who is laying in bed in NAD HEENT: head is normocephalic, atraumatic.  Sclera are noninjected.  Pupils equal and round.  Ears and nose without any masses or lesions.   Mouth is pink and moist. Dentition fair Heart: regular, rate, and rhythm.  No obvious murmurs, gallops, or rubs noted.  Palpable pedal pulses bilaterally Lungs: CTAB, no wheezes, rhonchi, or rales noted.  Respiratory effort nonlabored Abd: well healed midline/RUQ incisions, soft, distended, mild diffuse TTP without rebound or guarding, +BS, no masses, hernias, or organomegaly MS: dressing to RLE, moderate edema noted to right lower leg Skin: warm and dry with no masses, lesions, or rashes Psych: A&Ox3 with an appropriate affect. Neuro: cranial nerves grossly intact, extremity CSM intact bilaterally, normal speech  Results for orders placed or performed during the hospital encounter of 02/26/18 (from the past 48 hour(s))  Comprehensive metabolic panel     Status: Abnormal   Collection Time: 02/26/18  9:08 AM  Result Value Ref Range   Sodium 134 (L) 135 - 145 mmol/L   Potassium 4.9 3.5 - 5.1 mmol/L   Chloride 99 98 - 111 mmol/L   CO2 25 22 - 32 mmol/L   Glucose, Bld 152 (H) 70 - 99 mg/dL   BUN 31 (H) 8 - 23 mg/dL   Creatinine, Ser 1.27 (H) 0.44 - 1.00 mg/dL   Calcium 7.8 (L) 8.9 - 10.3 mg/dL   Total Protein 6.3 (L) 6.5 - 8.1 g/dL   Albumin 2.7 (L) 3.5 - 5.0 g/dL   AST 13 (L) 15 - 41 U/L   ALT 10 0 - 44 U/L   Alkaline Phosphatase 43 38 - 126 U/L   Total Bilirubin 1.0 0.3 - 1.2 mg/dL   GFR calc non Af Amer 37 (L) >60 mL/min   GFR calc Af Amer 43 (L) >60 mL/min   Anion gap 10 5 - 15    Comment: Performed at [facility], [place] [address] [place], [place] [address]  Lipase, blood     Status: None   Collection Time: 02/26/18  9:08 AM  Result Value Ref Range   Lipase 21 11 - 51 U/L    Comment: Performed at [facility], [place] [address], [place], [place] [address]  CBC with Differential     Status: Abnormal   Collection Time: 02/26/18  9:08 AM  Result Value Ref Range   WBC 11.7 (H) 4.0 - 10.5 K/uL   RBC 3.33 (L) 3.87 - 5.11 MIL/uL   Hemoglobin 11.4  (L) 12.0 - 15.0 g/dL   HCT 36.5 36.0 - 46.0 %   MCV 109.6 (H) 80.0 - 100.0 fL   MCH 34.2 (H) 26.0 - 34.0 pg   MCHC 31.2 30.0 - 36.0 g/dL   RDW 14.6 11.5 - 15.5 %   Platelets 301 150 - 400 K/uL   nRBC 0.0 0.0 - 0.2 %   Neutrophils Relative % 88 %  Comment: VACUOLATED NEUTROPHILS   Neutro Abs 10.2 (H) 1.7 - 7.7 K/uL   Lymphocytes Relative 6 %   Lymphs Abs 0.8 0.7 - 4.0 K/uL   Monocytes Relative 6 %   Monocytes Absolute 0.7 0.1 - 1.0 K/uL   Eosinophils Relative 0 %   Eosinophils Absolute 0.0 0.0 - 0.5 K/uL   Basophils Relative 0 %   Basophils Absolute 0.0 0.0 - 0.1 K/uL   Immature Granulocytes 0 %   Abs Immature Granulocytes 0.05 0.00 - 0.07 K/uL   Polychromasia PRESENT     Comment: Performed at [facility], [place] [address] [place], [place] [address]  I-Stat CG4 Lactic Acid, ED     Status: None   Collection Time: 02/26/18  9:09 AM  Result Value Ref Range   Lactic Acid, Venous 1.22 0.5 - 1.9 mmol/L  I-Stat CG4 Lactic Acid, ED     Status: None   Collection Time: 02/26/18 11:12 AM  Result Value Ref Range   Lactic Acid, Venous 0.81 0.5 - 1.9 mmol/L   Dg Abd Acute W/chest  Result Date: 02/26/2018 CLINICAL DATA:  Mid abdominal pain with nausea and vomiting. Constipation EXAM: DG ABDOMEN ACUTE W/ 1V CHEST COMPARISON:  Head CT 02/14/2018 FINDINGS: Porta catheter with tip at the upper cavoatrial junction. There is no edema, consolidation, effusion, or pneumothorax. Normal heart size. Dilated small bowel compatible with obstruction. There is known intra-abdominal malignancy. No pneumoperitoneum or visible pneumatosis. IMPRESSION: Small bowel obstruction in this patient with known intra-abdominal malignancy. Electronically Signed   By: Monte Fantasia M.D.   On: 02/26/2018 09:27      Assessment/Plan HTN H/o PUD GERD Chronic bronchiectasis - followed by Dr Melvyn Novas Ulcerative proctitis  Recent salmonella enteritis and c diff  Chronic diarrhea Metastatic transitional  cell carcinoma of right renal pelvis - followed by Dr. Irene Limbo and Dr. Tammi Klippel, recently underwent palliative chemotherapy [place] and [place]) and radiation but stopped after PET scan 11/15 showed worsening disease   Nausea, vomiting, and abdominal pain Partial SBO vs Ileus  CT scan wo contrast does show signs of possible SBO, unsure if this is due to adhesions from her multiple prior abdominal surgeries or from peritoneal metastasis from her known worsening metastatic transitional cell carcinoma of right renal pelvis. However, the patient did have a BM this morning and she is passing gas today so if obstructed she is only partially obstructed. An ileus from recent diarrhea could also be the cause of her worsening abdominal pain, nausea, and vomiting.   Agree with bowel rest and NG tube decompression for symptoms control. Will start patient on small bowel protocol with gastrograffin and delayed film. Recommend consulting oncology and palliative for goals of care disussion. Patient is not a very good surgical candidate.   Wellington Hampshire, [facility] Surgery 02/26/2018, 11:51 AM Pager: [phone]  Agree with above.  Could not place NGT.  Will hold off any SB protocol, repeat KUB in AM.  Alphonsa Overall, MD, [facility] Surgery Pager: [phone] Office phone:  [phone]

## 2018-02-26 NOTE — H&P (Addendum)
History and Physical  Tiffany [name] OQH:[id number] DOB: March 11, 1925 DOA: 02/26/2018  PCP: [name], MD   Patient coming from:Home    Chief Complaint: No bowel movements  HPI: Tiffany [name] is a 84 y.o. female with medical history significant for metastatic transitional cell carcinoma of right pelvis, chronic bronchiectasis, prior history of Salmonella enteritis and C. difficile, Hypertension who presents on 02/26/2018 with several days.    She previously had significant diarrhea for 6 months ( C. Diff and Salmonella). She describes it as loose watery 2-3 BMs for which she was taking scheduled imodium with no relief. She recently received octreotide infusion by oncology office this past Friday. This past Sunday she started noticing "problems" including right sided abdominal pain and no bowel movements. No exacerbating or alleviating factors.  She had one episode of emesis yesterday.  She has a poor appetite in general for several months because of poor taste.  She previously was    ED Course: Afebrile, hemodynamically stable, lactic acidNormal limits, CMP significant for creatinine 1.27, BUN 31, sodium 134, lipase 21 CBC with WBC 11.7, hemoglobin 11.4 CT abdomen showed proximal small bowel wall dilatation with transition to normal small bowel loops concerning for bowel obstruction due to either adhesive disease alternatively peritoneal metastasis  Review of Systems:As mentioned in the history of present illness.Review of systems are otherwise negative Patient seen in the ED.   Past Medical History:  Diagnosis Date  . Arthritis     knees 03-10-12 had Cortisone injection  . Cancer (HCC) june 2016   metastatic  . DDD (degenerative disc disease), cervical   . Edema leg    feet and ankles  . Esophagus disorder    Had esophagus stretched  . GERD (gastroesophageal reflux disease)    Benign stricture dilated in 2011  . Gout    Patient notes she has possible gout but has not been on  chronic medications for this  . Headache(784.0)   . Hypertension   . Occipital neuralgia   . Occipital neuralgia    Related to cervical degenerative disc disease  . Peptic ulcer disease    Previous history in the remote past  . Ulcerative proctitis (HCC)   . Ulcerative proctitis (HCC) 2014   Past Surgical History:  Procedure Laterality Date  . ABDOMINAL HYSTERECTOMY    . APPENDECTOMY    . CHOLECYSTECTOMY    . COLONOSCOPY WITH PROPOFOL  03/25/2012   Procedure: COLONOSCOPY WITH PROPOFOL;  Surgeon: Martin K Johnson, MD;  Location: [facility];  Service: Endoscopy;  Laterality: N/A;  . DILATION AND CURETTAGE OF UTERUS    . ESOPHAGEAL DILATION     For benign stricture in 2011  . ESOPHAGOGASTRODUODENOSCOPY    . FLEXIBLE SIGMOIDOSCOPY N/A 04/26/2014   Procedure: FLEXIBLE SIGMOIDOSCOPY - UnSedated;  Surgeon: Martin K Johnson, MD;  Location: [facility];  Service: Endoscopy;  Laterality: N/A;  . FLEXIBLE SIGMOIDOSCOPY N/A 01/24/2015   Procedure: FLEXIBLE SIGNMOIDOSCOPY W/ FLEET ENEMIA;  Surgeon: Martin K Johnson, MD;  Location: [facility];  Service: Endoscopy;  Laterality: N/A;  . FLEXIBLE SIGMOIDOSCOPY N/A 05/15/2016   Procedure: FLEXIBLE SIGMOIDOSCOPY;  Surgeon: Martin K Johnson, MD;  Location: [facility];  Service: Endoscopy;  Laterality: N/A;  pt needs to ahve an enema upon arrival   . NECK SURGERY     20  years ago  . TOTAL ABDOMINAL HYSTERECTOMY W/ BILATERAL SALPINGOOPHORECTOMY     At age 16 due to miscarriage and retained products of conception causing significant uterine bleeding. Patient  has been on estrogen replacement therapy since then.   Allergies  Allergen Reactions  . Nitrofurantoin Other (See Comments)    Trembling  . Indomethacin Swelling and Other (See Comments)    Tongue swelling  . Lisinopril Swelling and Other (See Comments)    Tongue swelling   Social History:  reports that she quit smoking about 34 years ago. She has never used smokeless tobacco. She reports  that she drinks alcohol. She reports that she does not use drugs. Family History  Problem Relation Age of Onset  . Lung cancer Sister   . Prostate cancer Brother   . Uterine cancer Maternal Aunt   . Breast cancer Paternal Grandmother   . Hodgkin's lymphoma Sister       Prior to Admission medications   Medication Sig Start Date End Date Taking? Authorizing Provider  acetaminophen (TYLENOL) 650 MG CR tablet Take 1,300 mg by mouth 2 (two) times daily.   Yes [provider]  amLODipine (NORVASC) 5 MG tablet Take 5 mg by mouth daily.   Yes [provider]  estradiol (ESTRACE) 0.5 MG tablet Take 0.5 mg by mouth daily. 08/02/14  Yes [provider]  lactose free nutrition (BOOST) LIQD Take 237 mLs by mouth daily.   Yes [provider]  mirtazapine (REMERON) 15 MG tablet TAKE 1 TABLET(15 MG) BY MOUTH AT BEDTIME Patient taking differently: Take 15 mg by mouth at bedtime.  01/07/18  Yes [name], MD  Probiotic Product (PROBIOTIC DAILY PO) Take 1 capsule by mouth every evening.    Yes [provider]  triamcinolone ointment (KENALOG) 0.5 % APPLY EXTERNALLY TO THE AFFECTED AREA TWICE DAILY Patient taking differently: Apply 1 application topically 2 (two) times daily.  09/23/17  Yes [name], MD  vitamin B-12 (CYANOCOBALAMIN) 100 MCG tablet Take 100 mcg by mouth daily.   Yes [provider]  cholestyramine (QUESTRAN) 4 g packet [place] 1 PACKET(4 GRAMS) BY MOUTH TWICE DAILY Patient not taking: Reported on 12/04/2017 11/07/17   [name], MD  feeding supplement, ENSURE ENLIVE, (ENSURE ENLIVE) LIQD Take 237 mLs by mouth 2 (two) times daily between meals. Patient not taking: Reported on 02/26/2018 07/26/17   Aline August, MD  fidaxomicin (DIFICID) 200 MG TABS tablet Take 1 tablet (200 mg total) by mouth 2 (two) times daily. Patient not taking: Reported on 02/26/2018 11/21/17   Carlyle Basques, MD  furosemide (LASIX)  20 MG tablet Take 1 tablet (20 mg total) by mouth daily for 3 days. Patient not taking: Reported on 02/26/2018 07/28/17 07/31/17  Gareth Morgan, MD  iron polysaccharides (NIFEREX) 150 MG capsule Take 1 capsule (150 mg total) by mouth daily. Patient not taking: Reported on 09/27/2017 07/04/17   [name], MD  ondansetron (ZOFRAN ODT) 4 MG disintegrating tablet Take 1 tablet (4 mg total) by mouth every 8 (eight) hours as needed for nausea or vomiting. Patient not taking: Reported on 02/26/2018 07/26/17   Aline August, MD  Respiratory Therapy Supplies (FLUTTER) [place] 1 each by Does not apply route daily. 07/06/15   Tanda Rockers, MD  sulfamethoxazole-trimethoprim (BACTRIM DS,SEPTRA DS) 800-160 MG tablet Take 1 tablet by mouth 2 (two) times daily. Patient not taking: Reported on 02/26/2018 12/20/17   Carlyle Basques, MD  vancomycin (VANCOCIN) 125 MG capsule Take 1 capsule (125 mg total) by mouth 4 (four) times daily. Patient not taking: Reported on 11/18/2017 10/28/17   Carlyle Basques, MD    Physical Exam: BP  135/74   Pulse 78   Temp 98.6 F (37 C) (Oral)   Resp 16   SpO2 95%   Constitutional elderly female, no distress Eyes: EOMI, anicteric, normal conjunctivae ENMT: Dry oral mucosa Cardiovascular: RRR no MRGs, 1+ pitting edema bilateral lower extremities (below knees) Respiratory: Normal respiratory effort on room air, clear breath sounds  Abdomen: Soft, distended, tenderness in right lower quadrant, decreased bowel sounds Skin: Port site in right chest looks clean with no surrounding erythema or drainage.No rash ulcers, or lesions. Without skin tenting  Neurologic: Grossly no focal neuro deficit. Psychiatric:Appropriate affect, and mood. Mental status AAOx3          Labs on Admission:  Basic Metabolic Panel: Recent Labs  Lab 02/21/18 1052 02/26/18 0908  NA 136 134*  K 4.4 4.9  CL 102 99  CO2 25 25  GLUCOSE 94 152*  BUN 17 31*  CREATININE 1.07* 1.27*  CALCIUM 9.1  7.8*   Liver Function Tests: Recent Labs  Lab 02/21/18 1052 02/26/18 0908  AST 16 13*  ALT <6 10  ALKPHOS 50 43  BILITOT 0.5 1.0  PROT 6.6 6.3*  ALBUMIN 2.4* 2.7*   Recent Labs  Lab 02/26/18 0908  LIPASE 21   No results for input(s): AMMONIA in the last 168 hours. CBC: Recent Labs  Lab 02/21/18 1052 02/26/18 0908  WBC 5.1 11.7*  NEUTROABS 3.0 10.2*  HGB 9.3* 11.4*  HCT 29.3* 36.5  MCV 106.9* 109.6*  PLT 240 301   Cardiac Enzymes: No results for input(s): CKTOTAL, CKMB, CKMBINDEX, TROPONINI in the last 168 hours.  BNP (last 3 results) Recent Labs    07/28/17 1358  BNP 1,676.9*    ProBNP (last 3 results) No results for input(s): PROBNP in the last 8760 hours.  CBG: No results for input(s): GLUCAP in the last 168 hours.  Radiological Exams on Admission: Ct Abdomen Pelvis Wo Contrast  Result Date: 02/26/2018 CLINICAL DATA:  Nausea and vomiting. EXAM: CT ABDOMEN AND PELVIS WITHOUT CONTRAST TECHNIQUE: Multidetector CT imaging of the abdomen and pelvis was performed following the standard protocol without IV contrast. COMPARISON:  PET-CT 02/14/2018 FINDINGS: Lower chest: No acute abnormality. Hepatobiliary: No focal liver abnormality is seen. Status post cholecystectomy. No biliary dilatation. Pancreas: Mild diffuse atrophy of the pancreas. No mass or inflammation. Spleen: Normal in size without focal abnormality. Adrenals/Urinary Tract: Normal adrenal glands. Large necrotic mass within the right abdomen compatible with known urothelial neoplasm is again noted measuring 11.2 by 9.5 cm, image 41/2. Soft tissue extending into the right ureter is again noted. Associated mass within the iliac fossa appears similar to previous study measuring 7.1 by 5.7 cm. Unremarkable appearance of the left kidney. Urinary bladder appears within normal limits. Stomach/Bowel: The stomach appears normal. There is abnormal increase caliber of the small bowel loops which measure up to 3.1 cm and  contain multiple air-fluid levels. Transition to normal to decreased caliber mid and distal small bowel loops identified within the left lower quadrant of the abdomen, image 63/2. Unremarkable appearance of the terminal ileum and colon. Vascular/Lymphatic: Aortic atherosclerosis. No aneurysm. Extensive branch vessel disease noted. No adenopathy within the abdomen or pelvis. Reproductive: The uterus is not visualized and may be surgically absent or atrophic. Other: There is a small volume of ascites within the abdomen and pelvis. Increased from previous exam. Musculoskeletal: Multifocal areas of abnormal sclerosis are identified compatible with sclerotic bone metastases. IMPRESSION: 1. Abnormal proximal small bowel dilatation with left lower quadrant transition to normal  caliber small bowel loops. Findings concerning for bowel obstruction. Findings may be due to adhesive disease. Alternatively peritoneal metastasis in a patient with known malignancy could result in bowel obstruction. 2. Similar appearance of large hypermetabolic right kidney mass with ureteral involvement and associated large right iliac fossa tumor. 3. Mild increase in volume of ascites. 4. Multifocal sclerotic bone lesions. These were not hypermetabolic on CT from 37/85/8850 and may reflect areas of treated bone metastasis. 5.  Aortic Atherosclerosis (ICD10-I70.0). Electronically Signed   By: Kerby Moors M.D.   On: 02/26/2018 12:55   Dg Abd Acute W/chest  Result Date: 02/26/2018 CLINICAL DATA:  Mid abdominal pain with nausea and vomiting. Constipation EXAM: DG ABDOMEN ACUTE W/ 1V CHEST COMPARISON:  Head CT 02/14/2018 FINDINGS: Porta catheter with tip at the upper cavoatrial junction. There is no edema, consolidation, effusion, or pneumothorax. Normal heart size. Dilated small bowel compatible with obstruction. There is known intra-abdominal malignancy. No pneumoperitoneum or visible pneumatosis. IMPRESSION: Small bowel obstruction in this  patient with known intra-abdominal malignancy. Electronically Signed   By: Monte Fantasia M.D.   On: 02/26/2018 09:27   Assessment/Plan Present on Admission: . SBO (small bowel obstruction) ([place]) . Bone metastases ([place]) . Liver metastases ([place]) . Transitional cell carcinoma of renal pelvis ([place]) . Hypertension  Active Problems:   Transitional cell carcinoma of renal pelvis (HCC)   Liver metastases (HCC)   Bone metastases (HCC)   Port catheter in place   SBO (small bowel obstruction) (HCC)   Hypertension   Proximal small bowel dilatation, concerning for bowel obstruction Abdominal x-ray CT show small bowel dilatation with transition to normal caliber small bowel loops Increased risk for SBO given intra-abdominal mass and possible peritoneal metastasis Can also be ileus in setting of chronic Imodium use and recent octreotide injection for chronic diarrhea -Appreciate surgery recommendations, NG tube, n.p.o., bowel rest - Small bowel protocol with Gastrografin per surgery -Normal saline fluid resuscitation  AKI, likely prerenal Decreased p.o. intake in setting of worsening abdominal pain Possibly some element of obstruction related to mass -Monitor output, time IV fluids, serial BMP  Leukocytosis Likely stress related No localizing signs or symptoms of infection CT showed no acute concerns for infection -Serial CBC -Blood cultures if becomes febrile  Right pelvis metastatic transitional cell carcinoma Followed by Dr. Irene Limbo Metastatic lesions to liver, bone, hilar/mediastinal lymphadenopathy Patient has no interest to continue immunotherapy or chemotherapy CT imaging here shows large necrotic mass within right abdomen, compatible with known urothelial neoplasm -Amenable to hospice services, consult palliative care  Hypertension, at goal -Continue home amlodipine   DVT prophylaxis: heparin subcut  Code Status: DNR, discussed with patient and confirmed on day of  admission  Family Communication: Discussed with husband at bedside.   Consults called: Palliative, patient interested in hospice care  Admission status: Admitted as inpatient, anticipate greater than 2 midnight stay for resolution of ileus versus SBO and patient with metastatic cancer, and AKI      Desiree Hane MD [facility]  Pager [phone]  If 7PM-7AM, please contact night-coverage [email or web address] Password [email or web address]  02/26/2018, 1:41 PM

## 2018-02-26 NOTE — ED Notes (Signed)
Patient transported to X-ray 

## 2018-02-26 NOTE — ED Notes (Signed)
Patient transported to CT 

## 2018-02-26 NOTE — ED Notes (Signed)
Hospitalist at bedside 

## 2018-02-27 ENCOUNTER — Inpatient Hospital Stay (HOSPITAL_COMMUNITY): Payer: Medicare Other

## 2018-02-27 DIAGNOSIS — A0471 Enterocolitis due to Clostridium difficile, recurrent: Principal | ICD-10-CM

## 2018-02-27 DIAGNOSIS — L8990 Pressure ulcer of unspecified site, unspecified stage: Secondary | ICD-10-CM

## 2018-02-27 DIAGNOSIS — Z515 Encounter for palliative care: Secondary | ICD-10-CM

## 2018-02-27 DIAGNOSIS — Z7189 Other specified counseling: Secondary | ICD-10-CM

## 2018-02-27 DIAGNOSIS — C787 Secondary malignant neoplasm of liver and intrahepatic bile duct: Secondary | ICD-10-CM

## 2018-02-27 LAB — CBC
HCT: 36.7 % (ref 36.0–46.0)
Hemoglobin: 11.3 g/dL — ABNORMAL LOW (ref 12.0–15.0)
MCH: 34.2 pg — ABNORMAL HIGH (ref 26.0–34.0)
MCHC: 30.8 g/dL (ref 30.0–36.0)
MCV: 111.2 fL — AB (ref 80.0–100.0)
Platelets: 311 10*3/uL (ref 150–400)
RBC: 3.3 MIL/uL — ABNORMAL LOW (ref 3.87–5.11)
RDW: 14.8 % (ref 11.5–15.5)
WBC: 15.2 10*3/uL — ABNORMAL HIGH (ref 4.0–10.5)
nRBC: 0 % (ref 0.0–0.2)

## 2018-02-27 LAB — BASIC METABOLIC PANEL
ANION GAP: 10 (ref 5–15)
BUN: 28 mg/dL — ABNORMAL HIGH (ref 8–23)
CO2: 21 mmol/L — ABNORMAL LOW (ref 22–32)
Calcium: 7 mg/dL — ABNORMAL LOW (ref 8.9–10.3)
Chloride: 108 mmol/L (ref 98–111)
Creatinine, Ser: 1.01 mg/dL — ABNORMAL HIGH (ref 0.44–1.00)
GFR calc Af Amer: 56 mL/min — ABNORMAL LOW (ref >60)
GFR calc non Af Amer: 49 mL/min — ABNORMAL LOW (ref >60)
Glucose, Bld: 85 mg/dL (ref 70–99)
Potassium: 4.2 mmol/L (ref 3.5–5.1)
Sodium: 139 mmol/L (ref 135–145)

## 2018-02-27 LAB — C DIFFICILE QUICK SCREEN W PCR REFLEX
C Diff antigen: POSITIVE — AB
C Diff interpretation: DETECTED
C Diff toxin: POSITIVE — AB

## 2018-02-27 MED ORDER — VANCOMYCIN 50 MG/ML ORAL SOLUTION: 125.0000 mg | ORAL | Status: DC

## 2018-02-27 MED ORDER — VANCOMYCIN 50 MG/ML ORAL SOLUTION: 125.0000 mg | Freq: Two times a day (BID) | ORAL | Status: DC

## 2018-02-27 MED ORDER — SODIUM CHLORIDE 0.9% FLUSH
10.0000 mL | INTRAVENOUS | Status: DC | PRN
  Administered 2018-03-01 – 2018-03-02 (×2): 10 mL
  Filled 2018-02-27 (×2): qty 40

## 2018-02-27 MED ORDER — VANCOMYCIN 50 MG/ML ORAL SOLUTION: 125.0000 mg | Freq: Every day | ORAL | Status: DC

## 2018-02-27 MED ORDER — VANCOMYCIN 50 MG/ML ORAL SOLUTION
125.0000 mg | Freq: Four times a day (QID) | ORAL | Status: DC
  Filled 2018-02-27: qty 2.5

## 2018-02-27 MED ORDER — VANCOMYCIN 50 MG/ML ORAL SOLUTION
125.0000 mg | Freq: Four times a day (QID) | ORAL | Status: DC
  Administered 2018-02-27 – 2018-03-03 (×18): 125 mg via ORAL
  Filled 2018-02-27 (×21): qty 2.5

## 2018-02-27 NOTE — Progress Notes (Signed)
PROGRESS NOTE    Tiffany [name]  WUJ:[id number] DOB: June 29, 1924 DOA: 02/26/2018 PCP: [name], MD   Brief Narrative: Tiffany [name] is a 84 y.o. female with a history of metastatic of the right pelvis, chronic bronchiectasis, recurrent C. difficile infection in addition to Salmonella enteritis.  Patient presented secondary to no bowel movements and emesis in addition to right-sided abdominal pain.  Imaging obtained which was concerning for possible small bowel obstruction.  General surgery was consulted.  C. difficile test obtained and is positive for recurrent infection.  Discussed with infectious disease recommended vancomycin treatment with a long taper.   Assessment & Plan:   Active Problems:   Transitional cell carcinoma of renal pelvis (HCC)   Liver metastases (HCC)   Bone metastases ([place])   Port catheter in place   SBO (small bowel obstruction) ([place])   Hypertension   Leukocytosis   Proximal small bowel dilation Initial concern for SBO. Patient with improved nausea/vomiting. Now with diarrhea. Unsuccessful NG tube placement overnight. -General surgery recommendations: repeat abdominal x-ray  Recurrent C. Difficile infection Patient treated multiple times for C. Difficile. She follows with infectious disease as an outpatient. Recent discussion for FMT consideration. Discussed with infectious disease who recommends vancomycin burst with long taper. -Vancomycin oral  AKI Baseline creatinine of 0.8. Creatinine of 1.27 on admission. In setting of decreased oral intake and likely hypovolemia. Improved with IV fluids -Continue IV fluids  Leukocytosis In setting of recurrent c. Difficile infection.  Right pelvis metastatic transitional cell carcinoma Patient currently not on immuno/chemotherapy. Patient considering hospice services per admission note. Palliative care consulted. -Palliative care recommendations  Pressure injury Left buttocks, POA  Essential  hypertension -Continue amlodipine  Suprapubic tenderness Patient states she has not urinated recently (just stool). Documented urine output from overnight. -Bladder scan   DVT prophylaxis: Heparin subq Code Status:   Code Status: DNR Family Communication: Husband at bedside Disposition Plan: Discharge pending clinical improvement   Consultants:   General surgery  Palliative care  Procedures:   None  Antimicrobials:  Vancomycin PO (11/28>>    Subjective: Feeling a little bit better today. Some suprapubic tenderness.  Objective: Vitals:   02/26/18 1330 02/26/18 1424 02/26/18 2043 02/27/18 0542  BP: 135/74 128/68 (!) 123/59 121/61  Pulse: 78 71 76 78  Resp:  18 18 18   Temp:  98.4 F (36.9 C) 97.9 F (36.6 C) 98.1 F (36.7 C)  TempSrc:  Oral Oral Oral  SpO2: 95% 97% 97% 97%  Weight:  48.6 kg  47.7 kg  Height:  5\' 1"  (1.549 m)      Intake/Output Summary (Last 24 hours) at 02/27/2018 0827 Last data filed at 02/27/2018 0345 Gross per 24 hour  Intake 890.57 ml  Output -  Net 890.57 ml   Filed Weights   02/26/18 1424 02/27/18 0542  Weight: 48.6 kg 47.7 kg    Examination:  General exam: Appears calm and comfortable Respiratory system: Clear to auscultation. Respiratory effort normal. Cardiovascular system: S1 & S2 heard, RRR. No murmurs, rubs, gallops or clicks. Gastrointestinal system: Abdomen is slightly distended, soft and nontender. Decreased bowel sounds heard. Central nervous system: Alert and oriented. No focal neurological deficits. Extremities: No edema. No calf tenderness Skin: No cyanosis. No rashes Psychiatry: Judgement and insight appear normal. Mood & affect appropriate.     Data Reviewed: I have personally reviewed following labs and imaging studies  CBC: Recent Labs  Lab 02/21/18 1052 02/26/18 0908 02/27/18 0650  WBC 5.1 11.7* 10.6*  NEUTROABS 3.0 10.2*  --   HGB 9.3* 11.4* 10.6*  HCT 29.3* 36.5 35.1*  MCV 106.9* 109.6* 110.7*   PLT 240 301 417   Basic Metabolic Panel: Recent Labs  Lab 02/21/18 1052 02/26/18 0908  NA 136 134*  K 4.4 4.9  CL 102 99  CO2 25 25  GLUCOSE 94 152*  BUN 17 31*  CREATININE 1.07* 1.27*  CALCIUM 9.1 7.8*   GFR: Estimated Creatinine Clearance: 21.7 mL/min (A) (by C-G formula based on SCr of 1.27 mg/dL (H)). Liver Function Tests: Recent Labs  Lab 02/21/18 1052 02/26/18 0908  AST 16 13*  ALT <6 10  ALKPHOS 50 43  BILITOT 0.5 1.0  PROT 6.6 6.3*  ALBUMIN 2.4* 2.7*   Recent Labs  Lab 02/26/18 0908  LIPASE 21   No results for input(s): AMMONIA in the last 168 hours. Coagulation Profile: No results for input(s): INR, PROTIME in the last 168 hours. Cardiac Enzymes: No results for input(s): CKTOTAL, CKMB, CKMBINDEX, TROPONINI in the last 168 hours. BNP (last 3 results) No results for input(s): PROBNP in the last 8760 hours. HbA1C: No results for input(s): HGBA1C in the last 72 hours. CBG: No results for input(s): GLUCAP in the last 168 hours. Lipid Profile: No results for input(s): CHOL, HDL, LDLCALC, TRIG, CHOLHDL, LDLDIRECT in the last 72 hours. Thyroid Function Tests: No results for input(s): TSH, T4TOTAL, FREET4, T3FREE, THYROIDAB in the last 72 hours. Anemia Panel: No results for input(s): VITAMINB12, FOLATE, FERRITIN, TIBC, IRON, RETICCTPCT in the last 72 hours. Sepsis Labs: Recent Labs  Lab 02/26/18 0909 02/26/18 1112  LATICACIDVEN 1.22 0.81    No results found for this or any previous visit (from the past 240 hour(s)).       Radiology Studies: Ct Abdomen Pelvis Wo Contrast  Result Date: 02/26/2018 CLINICAL DATA:  Nausea and vomiting. EXAM: CT ABDOMEN AND PELVIS WITHOUT CONTRAST TECHNIQUE: Multidetector CT imaging of the abdomen and pelvis was performed following the standard protocol without IV contrast. COMPARISON:  PET-CT 02/14/2018 FINDINGS: Lower chest: No acute abnormality. Hepatobiliary: No focal liver abnormality is seen. Status post  cholecystectomy. No biliary dilatation. Pancreas: Mild diffuse atrophy of the pancreas. No mass or inflammation. Spleen: Normal in size without focal abnormality. Adrenals/Urinary Tract: Normal adrenal glands. Large necrotic mass within the right abdomen compatible with known urothelial neoplasm is again noted measuring 11.2 by 9.5 cm, image 41/2. Soft tissue extending into the right ureter is again noted. Associated mass within the iliac fossa appears similar to previous study measuring 7.1 by 5.7 cm. Unremarkable appearance of the left kidney. Urinary bladder appears within normal limits. Stomach/Bowel: The stomach appears normal. There is abnormal increase caliber of the small bowel loops which measure up to 3.1 cm and contain multiple air-fluid levels. Transition to normal to decreased caliber mid and distal small bowel loops identified within the left lower quadrant of the abdomen, image 63/2. Unremarkable appearance of the terminal ileum and colon. Vascular/Lymphatic: Aortic atherosclerosis. No aneurysm. Extensive branch vessel disease noted. No adenopathy within the abdomen or pelvis. Reproductive: The uterus is not visualized and may be surgically absent or atrophic. Other: There is a small volume of ascites within the abdomen and pelvis. Increased from previous exam. Musculoskeletal: Multifocal areas of abnormal sclerosis are identified compatible with sclerotic bone metastases. IMPRESSION: 1. Abnormal proximal small bowel dilatation with left lower quadrant transition to normal caliber small bowel loops. Findings concerning for bowel obstruction. Findings may be due to adhesive disease. Alternatively peritoneal metastasis  in a patient with known malignancy could result in bowel obstruction. 2. Similar appearance of large hypermetabolic right kidney mass with ureteral involvement and associated large right iliac fossa tumor. 3. Mild increase in volume of ascites. 4. Multifocal sclerotic bone lesions. These  were not hypermetabolic on CT from 45/62/5638 and may reflect areas of treated bone metastasis. 5.  Aortic Atherosclerosis (ICD10-I70.0). Electronically Signed   By: Kerby Moors M.D.   On: 02/26/2018 12:55   Dg Abd Acute W/chest  Result Date: 02/26/2018 CLINICAL DATA:  Mid abdominal pain with nausea and vomiting. Constipation EXAM: DG ABDOMEN ACUTE W/ 1V CHEST COMPARISON:  Head CT 02/14/2018 FINDINGS: Porta catheter with tip at the upper cavoatrial junction. There is no edema, consolidation, effusion, or pneumothorax. Normal heart size. Dilated small bowel compatible with obstruction. There is known intra-abdominal malignancy. No pneumoperitoneum or visible pneumatosis. IMPRESSION: Small bowel obstruction in this patient with known intra-abdominal malignancy. Electronically Signed   By: Monte Fantasia M.D.   On: 02/26/2018 09:27        Scheduled Meds: . amLODipine  5 mg Oral Daily  . heparin  5,000 Units Subcutaneous Q8H  . mirtazapine  15 mg Oral QHS  . triamcinolone ointment  1 application Topical BID   Continuous Infusions: . sodium chloride 75 mL/hr at 02/27/18 9373     LOS: 1 day     Cordelia Poche, MD [facility] 02/27/2018, 8:27 AM  If 7PM-7AM, please contact night-coverage [email or web address]

## 2018-02-27 NOTE — Consult Note (Signed)
Consultation Note Date: 02/27/2018   Patient Name: Tiffany [name]  DOB: 12/16/1924  MRN: [id number]  Age / Sex: 84 y.o., female  PCP: [name], MD Referring Physician: Mariel Aloe, MD  Reason for Consultation: Establishing goals of care  HPI/Patient Profile: 84 y.o. 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 Patient has been admitted with diarrhea partial small bowel obstruction versus ileus, her C. Difficile testing is positive, she has been started on by mouth vancomycin.  Palliative consultation for goals of care discussions.  Patient is awake alert oriented sitting up in bed. Husband present at the bedside. I introduced myself and palliative care as follows: Palliative medicine is specialized medical care for people living with serious illness. It focuses on providing relief from the symptoms and stress of a serious illness. The goal is to improve quality of life for both the patient and the family.  Patient is able to provide all of her history. She states that she does not want any further treatments and understands that she has advanced cancer. She is hopeful that the oral vancomycin will work and that she will be able to get the C. Difficile infection under better control. She states that she has become tired of having ongoing diarrhea essentially for the past 2 weeks now. She complains that it has left her fatigued and functionally weak as well. Her goal is to remain at home for as long as possible. Husband present at the bedside  although did not participate much in discussions.  Please note additional discussions and recommendations as listed below. Thank you for the consult.  NEXT OF KIN  husband Tiffany [name] who was at the bedside today.   SUMMARY OF RECOMMENDATIONS    Agree with DNR Recommend home with hospice on discharge. Continue current mode of care  Code Status/Advance Care Planning:  DNR    Symptom Management:     As above Palliative Prophylaxis:   Delirium Protocol   Psycho-social/Spiritual:   Desire for further Chaplaincy support:yes  Additional Recommendations: Education on Hospice  Prognosis:   < 6 months  Discharge Planning: Home with Hospice      Primary Diagnoses: Present on Admission: . SBO (small bowel obstruction) ([place]) . Bone metastases ([place]) . Liver metastases ([place]) . Transitional cell carcinoma of renal pelvis ([place]) . Hypertension . Leukocytosis   I have reviewed the medical record, interviewed the patient and family, and examined the patient. The following aspects are pertinent.  Past Medical History:  Diagnosis Date  . Arthritis     knees 03-10-12 had Cortisone injection  . Cancer [facility] june 2016   metastatic  . DDD (degenerative disc disease), cervical   .  Edema leg    feet and ankles  . Esophagus disorder    Had esophagus stretched  . GERD (gastroesophageal reflux disease)    Benign stricture dilated in 2011  . Gout    Patient notes she has possible gout but has not been on chronic medications for this  . Headache(784.0)   . Hypertension   . Occipital neuralgia   . Occipital neuralgia    Related to cervical degenerative disc disease  . Peptic ulcer disease    Previous history in the remote past  . Ulcerative proctitis ([place])   . Ulcerative proctitis ([place]) 2014   Social History   Socioeconomic History  . Marital status: Married    Spouse name: Not on file  . Number of children: Not on file  . Years of education: Not on file  . Highest  education level: Not on file  Occupational History  . Not on file  Social Needs  . Financial resource strain: Not on file  . Food insecurity:    Worry: Not on file    Inability: Not on file  . Transportation needs:    Medical: Not on file    Non-medical: Not on file  Tobacco Use  . Smoking status: Former Smoker    Last attempt to quit: 11/01/1983    Years since quitting: 34.3  . Smokeless tobacco: Never Used  Substance and Sexual Activity  . Alcohol use: Yes    Comment: occ  . Drug use: No  . Sexual activity: Never  Lifestyle  . Physical activity:    Days per week: Not on file    Minutes per session: Not on file  . Stress: Not on file  Relationships  . Social connections:    Talks on phone: Not on file    Gets together: Not on file    Attends religious service: Not on file    Active member of club or organization: Not on file    Attends meetings of clubs or organizations: Not on file    Relationship status: Not on file  Other Topics Concern  . Not on file  Social History Narrative  . Not on file   Family History  Problem Relation Age of Onset  . Lung cancer Sister   . Prostate cancer Brother   . Uterine cancer Maternal Aunt   . Breast cancer Paternal Grandmother   . Hodgkin's lymphoma Sister    Scheduled Meds: . amLODipine  5 mg Oral Daily  . heparin  5,000 Units Subcutaneous Q8H  . mirtazapine  15 mg Oral QHS  . triamcinolone ointment  1 application Topical BID  . vancomycin  125 mg Oral QID   Followed by  . [START ON 03/13/2018] vancomycin  125 mg Oral BID   Followed by  . [START ON 03/20/2018] vancomycin  125 mg Oral Daily   Followed by  . [START ON 03/27/2018] vancomycin  125 mg Oral QODAY   Followed by  . [START ON 04/24/2018] vancomycin  125 mg Oral Q3 days   Continuous Infusions: PRN Meds:.sodium chloride flush Medications Prior to Admission:  Prior to Admission medications   Medication Sig Start Date End Date Taking? Authorizing Provider    acetaminophen (TYLENOL) 650 MG CR tablet Take 1,300 mg by mouth 2 (two) times daily.   Yes [provider]  amLODipine (NORVASC) 5 MG tablet Take 5 mg by mouth daily.   Yes [provider]  estradiol (ESTRACE) 0.5 MG tablet Take 0.5 mg by  mouth daily. 08/02/14  Yes [provider]  lactose free nutrition (BOOST) LIQD Take 237 mLs by mouth daily.   Yes [provider]  mirtazapine (REMERON) 15 MG tablet TAKE 1 TABLET(15 MG) BY MOUTH AT BEDTIME Patient taking differently: Take 15 mg by mouth at bedtime.  01/07/18  Yes [name], MD  Probiotic Product (PROBIOTIC DAILY PO) Take 1 capsule by mouth every evening.    Yes [provider]  triamcinolone ointment (KENALOG) 0.5 % APPLY EXTERNALLY TO THE AFFECTED AREA TWICE DAILY Patient taking differently: Apply 1 application topically 2 (two) times daily.  09/23/17  Yes [name], MD  vitamin B-12 (CYANOCOBALAMIN) 100 MCG tablet Take 100 mcg by mouth daily.   Yes [provider]  cholestyramine (QUESTRAN) 4 g packet [place] 1 PACKET(4 GRAMS) BY MOUTH TWICE DAILY Patient not taking: Reported on 12/04/2017 11/07/17   [name], MD  feeding supplement, ENSURE ENLIVE, (ENSURE ENLIVE) LIQD Take 237 mLs by mouth 2 (two) times daily between meals. Patient not taking: Reported on 02/26/2018 07/26/17   Aline August, MD  fidaxomicin (DIFICID) 200 MG TABS tablet Take 1 tablet (200 mg total) by mouth 2 (two) times daily. Patient not taking: Reported on 02/26/2018 11/21/17   Carlyle Basques, MD  furosemide (LASIX) 20 MG tablet Take 1 tablet (20 mg total) by mouth daily for 3 days. Patient not taking: Reported on 02/26/2018 07/28/17 07/31/17  Gareth Morgan, MD  iron polysaccharides (NIFEREX) 150 MG capsule Take 1 capsule (150 mg total) by mouth daily. Patient not taking: Reported on 09/27/2017 07/04/17   [name], MD  ondansetron (ZOFRAN ODT) 4 MG disintegrating tablet Take 1  tablet (4 mg total) by mouth every 8 (eight) hours as needed for nausea or vomiting. Patient not taking: Reported on 02/26/2018 07/26/17   Aline August, MD  Respiratory Therapy Supplies (FLUTTER) [place] 1 each by Does not apply route daily. 07/06/15   Tanda Rockers, MD  sulfamethoxazole-trimethoprim (BACTRIM DS,SEPTRA DS) 800-160 MG tablet Take 1 tablet by mouth 2 (two) times daily. Patient not taking: Reported on 02/26/2018 12/20/17   Carlyle Basques, MD  vancomycin (VANCOCIN) 125 MG capsule Take 1 capsule (125 mg total) by mouth 4 (four) times daily. Patient not taking: Reported on 11/18/2017 10/28/17   Carlyle Basques, MD   Allergies  Allergen Reactions  . Nitrofurantoin Other (See Comments)    Trembling  . Indomethacin Swelling and Other (See Comments)    Tongue swelling  . Lisinopril Swelling and Other (See Comments)    Tongue swelling   Review of Systems +some abd pain  Physical Exam  awake alert oriented Appears calm and comfortable   Clear to auscultation. Respiratory effort normal.   S1 & S2 heard,   Gastrointestinal system: Abdomen is slightly distended, soft and nontender. Decreased bowel sounds heard.   Alert and oriented. No focal neurological deficits. Extremities: No edema. No calf tenderness Skin: No cyanosis. No rashes Psychiatry: Judgement and insight appear normal. Mood & affect appropriate.  thin with muscle wasting, patient states she has had 20 lb unintentional weight loss since the past few months.   Vital Signs: BP (!) 105/53 (BP Location: Right Arm)   Pulse 74   Temp 97.8 F (36.6 C) (Oral)   Resp 18   Ht 5\' 1"  (1.549 m)   Wt 47.7 kg   SpO2 98%   BMI 19.87 kg/m  Pain Scale: 0-10   Pain Score: 0-No pain   SpO2:  SpO2: 98 % O2 Device:SpO2: 98 % O2 Flow Rate: .   IO: Intake/output summary:   Intake/Output Summary (Last 24 hours) at 02/27/2018 1452 Last data filed at 02/27/2018 0755 Gross per 24 hour  Intake 890.57 ml  Output -  Net 890.57 ml     LBM: Last BM Date: 02/27/18 Baseline Weight: Weight: 48.6 kg Most recent weight: Weight: 47.7 kg     Palliative Assessment/Data:   PPS 40%  Time In:  1300 Time Out:  1400 Time Total:  60 min  Greater than 50%  of this time was spent counseling and coordinating care related to the above assessment and plan.  Signed by: Loistine Chance, MD [phone]  Please contact Palliative Medicine Team phone at [phone] for questions and concerns.  For individual provider: See Shea Evans

## 2018-02-27 NOTE — Progress Notes (Signed)
CRITICAL VALUE ALERT  Critical Value:  C. Diff positive   Date & Time Notied:  02/27/18 at [place]  Provider Notified: Lonny Prude, R  Orders Received/Actions taken: Awaiting orders or call back.

## 2018-02-27 NOTE — Progress Notes (Addendum)
Subjective/Chief Complaint: Pt with no n/v overnight Con't with diarrhea throughout the night. NPO   Objective: Vital signs in last 24 hours: Temp:  [97.9 F (36.6 C)-98.4 F (36.9 C)] 98.1 F (36.7 C) (11/28 0542) Pulse Rate:  [71-82] 78 (11/28 0542) Resp:  [16-18] 18 (11/28 0542) BP: (121-140)/(59-78) 121/61 (11/28 0542) SpO2:  [94 %-97 %] 97 % (11/28 0542) Weight:  [47.7 kg-48.6 kg] 47.7 kg (11/28 0542) Last BM Date: 02/26/18  Intake/Output from previous day: 11/27 0701 - 11/28 0700 In: 890.6 [I.V.:890.6] Out: -  Intake/Output this shift: No intake/output data recorded.  Constitutional: No acute distress, conversant, appears states age. Eyes: Anicteric sclerae, moist conjunctiva, no lid lag Lungs: Clear to auscultation bilaterally, normal respiratory effort CV: regular rate and rhythm, no murmurs, no peripheral edema, pedal pulses 2+ GI: Soft, no masses or hepatosplenomegaly, non-tender to palpation, min distended Skin: No rashes, palpation reveals normal turgor Psychiatric: appropriate judgment and insight, oriented to person, place, and time   Lab Results:  Recent Labs    02/26/18 0908 02/27/18 0650  WBC 11.7* 10.6*  HGB 11.4* 10.6*  HCT 36.5 35.1*  PLT 301 290   BMET Recent Labs    02/26/18 0908  NA 134*  K 4.9  CL 99  CO2 25  GLUCOSE 152*  BUN 31*  CREATININE 1.27*  CALCIUM 7.8*   Studies/Results: Ct Abdomen Pelvis Wo Contrast  Result Date: 02/26/2018 CLINICAL DATA:  Nausea and vomiting. EXAM: CT ABDOMEN AND PELVIS WITHOUT CONTRAST TECHNIQUE: Multidetector CT imaging of the abdomen and pelvis was performed following the standard protocol without IV contrast. COMPARISON:  PET-CT 02/14/2018 FINDINGS: Lower chest: No acute abnormality. Hepatobiliary: No focal liver abnormality is seen. Status post cholecystectomy. No biliary dilatation. Pancreas: Mild diffuse atrophy of the pancreas. No mass or inflammation. Spleen: Normal in size without focal  abnormality. Adrenals/Urinary Tract: Normal adrenal glands. Large necrotic mass within the right abdomen compatible with known urothelial neoplasm is again noted measuring 11.2 by 9.5 cm, image 41/2. Soft tissue extending into the right ureter is again noted. Associated mass within the iliac fossa appears similar to previous study measuring 7.1 by 5.7 cm. Unremarkable appearance of the left kidney. Urinary bladder appears within normal limits. Stomach/Bowel: The stomach appears normal. There is abnormal increase caliber of the small bowel loops which measure up to 3.1 cm and contain multiple air-fluid levels. Transition to normal to decreased caliber mid and distal small bowel loops identified within the left lower quadrant of the abdomen, image 63/2. Unremarkable appearance of the terminal ileum and colon. Vascular/Lymphatic: Aortic atherosclerosis. No aneurysm. Extensive branch vessel disease noted. No adenopathy within the abdomen or pelvis. Reproductive: The uterus is not visualized and may be surgically absent or atrophic. Other: There is a small volume of ascites within the abdomen and pelvis. Increased from previous exam. Musculoskeletal: Multifocal areas of abnormal sclerosis are identified compatible with sclerotic bone metastases. IMPRESSION: 1. Abnormal proximal small bowel dilatation with left lower quadrant transition to normal caliber small bowel loops. Findings concerning for bowel obstruction. Findings may be due to adhesive disease. Alternatively peritoneal metastasis in a patient with known malignancy could result in bowel obstruction. 2. Similar appearance of large hypermetabolic right kidney mass with ureteral involvement and associated large right iliac fossa tumor. 3. Mild increase in volume of ascites. 4. Multifocal sclerotic bone lesions. These were not hypermetabolic on CT from 62/83/1517 and may reflect areas of treated bone metastasis. 5.  Aortic Atherosclerosis (ICD10-I70.0).  Electronically Signed  By: Kerby Moors M.D.   On: 02/26/2018 12:55   Dg Abd Acute W/chest  Result Date: 02/26/2018 CLINICAL DATA:  Mid abdominal pain with nausea and vomiting. Constipation EXAM: DG ABDOMEN ACUTE W/ 1V CHEST COMPARISON:  Head CT 02/14/2018 FINDINGS: Porta catheter with tip at the upper cavoatrial junction. There is no edema, consolidation, effusion, or pneumothorax. Normal heart size. Dilated small bowel compatible with obstruction. There is known intra-abdominal malignancy. No pneumoperitoneum or visible pneumatosis. IMPRESSION: Small bowel obstruction in this patient with known intra-abdominal malignancy. Electronically Signed   By: Monte Fantasia M.D.   On: 02/26/2018 09:27    Anti-infectives: Anti-infectives (From admission, onward)   None      Assessment/Plan: HTN H/o PUD GERD Chronic bronchiectasis - followed by Dr Melvyn Novas Ulcerative proctitis  Recent salmonella enteritis and c diff  Chronic diarrhea 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 Irene Limbo and Dr. Tammi Klippel, recently underwent palliative chemotherapy Huey Bienenstock and [place]) and radiation but stopped after PET scan 11/15 showed worsening disease   Nausea, vomiting, and abdominal pain Partial SBO vs Ileus  -She does not appear to have SBO as per con't diarrhea.  Could be ileus related to AGE vs. Chemo induced diarrhea. OK to start liquids and adv as tol. . Addendum: C. Diff + KUB reviewed and shows signs ileus likely related to colitis   LOS: 1 day    Ralene Ok 02/27/2018

## 2018-02-28 ENCOUNTER — Telehealth: Payer: Self-pay | Admitting: Hematology

## 2018-02-28 DIAGNOSIS — A0472 Enterocolitis due to Clostridium difficile, not specified as recurrent: Secondary | ICD-10-CM

## 2018-02-28 NOTE — Evaluation (Signed)
Physical Therapy Evaluation Patient Details Name: Tiffany [name] MRN: [id number] DOB: [birth year] Today's Date: 02/28/2018   History of Present Illness  84 yo female admitted 11/27 from home with abdominal pain, N/V. Pt with metastatic transitional cell carcinoma with 2 recently found pelvic masses, with palliative radiation and chemo. Pt with history of c-diff and salmonella enteritis. Other PMH includes OA, DDD of cervical spine with surgery, LE edema, gout, GERD, HTN, occipital neuralgia, ulcerative proctitis, SBO, bronchiectasis, multiple abdominal procedures.   Clinical Impression   Pt presents with LE weakness, difficulty performing bed mobility and transfers, balance deficits, and decreased tolerance for ambulation. Pt to benefit from acute PT to address deficits. Pt ambulated 6 ft during session todaywith min guard assist. Pt with 2 BMs during session, requires assist for pericare. At home, pt states she wears adult briefs and cleans herself to manage BMs. PT recommending HHPT at this time, due to pt's plan to d/c home with hospice. Pt may refuse, as she informed Pt that she d/c-ed HHPT services recently. PT to progress mobility as tolerated, and will continue to follow acutely.      Follow Up Recommendations Home health PT;Supervision for mobility/OOB(Pt to go home with hospice )    Equipment Recommendations  None recommended by PT    Recommendations for Other Services       Precautions / Restrictions Precautions Precautions: Fall Precaution Comments: on enteric precautions Restrictions Weight Bearing Restrictions: No      Mobility  Bed Mobility Overal bed mobility: Needs Assistance Bed Mobility: Supine to Sit;Rolling Rolling: Modified independent (Device/Increase time)   Supine to sit: Min assist;HOB elevated     General bed mobility comments: Pt with use of bedrails for rolling bilaterally during pericare (pt soiled with diarrhea). Min assist for trunk elevation, pt  with increased time with scooting to EOB.   Transfers Overall transfer level: Needs assistance Equipment used: Rolling walker (2 wheeled) Transfers: Sit to/from Stand Sit to Stand: From elevated surface;Min assist;+2 safety/equipment         General transfer comment: Min assist for power up and steadying, +2 for safety. Sit to stand x2, once from bed and once from toilet.   Ambulation/Gait Ambulation/Gait assistance: Min guard Gait Distance (Feet): 6 Feet Assistive device: Rolling walker (2 wheeled) Gait Pattern/deviations: Step-through pattern;Decreased stride length Gait velocity: decr   General Gait Details: Min guard for safety. Verbal cuing for placement in RW.   Stairs            Wheelchair Mobility    Modified Rankin (Stroke Patients Only)       Balance Overall balance assessment: Needs assistance Sitting-balance support: No upper extremity supported Sitting balance-Leahy Scale: Fair       Standing balance-Leahy Scale: Poor Standing balance comment: relies on RW for support                              Pertinent Vitals/Pain Pain Assessment: 0-10 Pain Score: 0-No pain Pain Intervention(s): Monitored during session    Home Living Family/patient expects to be discharged to:: Private residence Living Arrangements: Spouse/significant other Available Help at Discharge: Family;Available 24 hours/day(Per pt, pt's husband has some memory deficits and the pt and husband "help each other") Type of Home: House Home Access: Stairs to enter Entrance Stairs-Rails: Chemical engineer of Steps: 4 Home Layout: Multi-level Home Equipment: Cane - single point;Walker - 2 wheels Additional Comments: uses the cane and RW for ambulation,  but does not use AD in the home and uses environment for stabilizing     Prior Function Level of Independence: Independent with assistive device(s)         Comments: uses AD outside the home, uses  environment for stability in home     Hand Dominance        Extremity/Trunk Assessment   Upper Extremity Assessment Upper Extremity Assessment: Overall WFL for tasks assessed    Lower Extremity Assessment Lower Extremity Assessment: Generalized weakness    Cervical / Trunk Assessment Cervical / Trunk Assessment: Normal  Communication   Communication: No difficulties  Cognition Arousal/Alertness: Awake/alert Behavior During Therapy: WFL for tasks assessed/performed Overall Cognitive Status: Within Functional Limits for tasks assessed                                        General Comments General comments (skin integrity, edema, etc.): RLE edema noted     Exercises General Exercises - Lower Extremity Ankle Circles/Pumps: AROM;Both;10 reps;Seated Quad Sets: AROM;Both;5 reps;Seated Heel Slides: AROM;Both;5 reps;Seated   Assessment/Plan    PT Assessment Patient needs continued PT services  PT Problem List Decreased strength;Pain;Decreased range of motion;Decreased activity tolerance;Decreased knowledge of use of DME;Decreased balance;Decreased mobility;Decreased safety awareness       PT Treatment Interventions DME instruction;Therapeutic activities;Gait training;Therapeutic exercise;Patient/family education;Balance training;Stair training;Functional mobility training    PT Goals (Current goals can be found in the Care Plan section)  Acute Rehab PT Goals Patient Stated Goal: go home with husband PT Goal Formulation: With patient Time For Goal Achievement: 03/14/18 Potential to Achieve Goals: Good    Frequency Min 3X/week   Barriers to discharge        Co-evaluation               AM-PAC PT "6 Clicks" Mobility  Outcome Measure Help needed turning from your back to your side while in a flat bed without using bedrails?: A Little Help needed moving from lying on your back to sitting on the side of a flat bed without using bedrails?: A  Little Help needed moving to and from a bed to a chair (including a wheelchair)?: A Little Help needed standing up from a chair using your arms (e.g., wheelchair or bedside chair)?: A Little Help needed to walk in hospital room?: A Little Help needed climbing 3-5 steps with a railing? : A Lot 6 Click Score: 17    End of Session Equipment Utilized During Treatment: Gait belt Activity Tolerance: Patient tolerated treatment well Patient left: in chair;with call bell/phone within reach;with family/visitor present(Pt verbally agrees to mobilize after pressing call bell and having assist of RN or NT) Nurse Communication: Mobility status PT Visit Diagnosis: Other abnormalities of gait and mobility (R26.89);Unsteadiness on feet (R26.81)    Time: 1010-1045 PT Time Calculation (min) (ACUTE ONLY): 35 min   Charges:   PT Evaluation $PT Eval Low Complexity: 1 Low PT Treatments $Therapeutic Activity: 8-22 mins        Chiyeko Ferre Conception Chancy, PT Acute Rehabilitation Services Pager [phone]  Office [phone]   Laurian Edrington D Alyxandra Tenbrink 02/28/2018, 1:13 PM

## 2018-02-28 NOTE — Telephone Encounter (Signed)
Faxed medical records to [facility] at [phone], Release ID: [id number]

## 2018-02-28 NOTE — Progress Notes (Signed)
Subjective/Chief Complaint: Pt with con't diarrhea Some abd bloating   Objective: Vital signs in last 24 hours: Temp:  [97.5 F (36.4 C)-97.8 F (36.6 C)] 97.5 F (36.4 C) (11/29 0550) Pulse Rate:  [74-76] 76 (11/29 0550) Resp:  [16-18] 16 (11/29 0550) BP: (105-122)/(51-58) 105/51 (11/29 0550) SpO2:  [91 %-98 %] 96 % (11/29 0550) Weight:  [47.8 kg] 47.8 kg (11/29 0530) Last BM Date: 02/27/18  Intake/Output from previous day: 11/28 0701 - 11/29 0700 In: 857.8 [I.V.:857.8] Out: -  Intake/Output this shift: No intake/output data recorded.  Constitutional: No acute distress, conversant, appears states age. Eyes: Anicteric sclerae, moist conjunctiva, no lid lag Lungs: Clear to auscultation bilaterally, normal respiratory effort CV: regular rate and rhythm, no murmurs, no peripheral edema, pedal pulses 2+ GI: Soft, no masses or hepatosplenomegaly, min tender to palpation LUQ/LLQ, no rebound/guarding Skin: No rashes, palpation reveals normal turgor Psychiatric: appropriate judgment and insight, oriented to person, place, and time   Lab Results:  Recent Labs    02/27/18 0650 02/27/18 0941  WBC QUESTIONABLE RESULTS, RECOMMEND RECOLLECT TO VERIFY 15.2*  HGB QUESTIONABLE RESULTS, RECOMMEND RECOLLECT TO VERIFY 11.3*  HCT QUESTIONABLE RESULTS, RECOMMEND RECOLLECT TO VERIFY 36.7  PLT QUESTIONABLE RESULTS, RECOMMEND RECOLLECT TO VERIFY 311   BMET Recent Labs    02/27/18 0650 02/27/18 0941  NA QUESTIONABLE RESULTS, RECOMMEND RECOLLECT TO VERIFY 139  K QUESTIONABLE RESULTS, RECOMMEND RECOLLECT TO VERIFY 4.2  CL QUESTIONABLE RESULTS, RECOMMEND RECOLLECT TO VERIFY 108  CO2 QUESTIONABLE RESULTS, RECOMMEND RECOLLECT TO VERIFY 21*  GLUCOSE QUESTIONABLE RESULTS, RECOMMEND RECOLLECT TO VERIFY 85  BUN QUESTIONABLE RESULTS, RECOMMEND RECOLLECT TO VERIFY 28*  CREATININE QUESTIONABLE RESULTS, RECOMMEND RECOLLECT TO VERIFY 1.01*  CALCIUM QUESTIONABLE RESULTS, RECOMMEND RECOLLECT TO  VERIFY 7.0*   PT/INR No results for input(s): LABPROT, INR in the last 72 hours. ABG No results for input(s): PHART, HCO3 in the last 72 hours.  Invalid input(s): PCO2, PO2  Studies/Results: Ct Abdomen Pelvis Wo Contrast  Result Date: 02/26/2018 CLINICAL DATA:  Nausea and vomiting. EXAM: CT ABDOMEN AND PELVIS WITHOUT CONTRAST TECHNIQUE: Multidetector CT imaging of the abdomen and pelvis was performed following the standard protocol without IV contrast. COMPARISON:  PET-CT 02/14/2018 FINDINGS: Lower chest: No acute abnormality. Hepatobiliary: No focal liver abnormality is seen. Status post cholecystectomy. No biliary dilatation. Pancreas: Mild diffuse atrophy of the pancreas. No mass or inflammation. Spleen: Normal in size without focal abnormality. Adrenals/Urinary Tract: Normal adrenal glands. Large necrotic mass within the right abdomen compatible with known urothelial neoplasm is again noted measuring 11.2 by 9.5 cm, image 41/2. Soft tissue extending into the right ureter is again noted. Associated mass within the iliac fossa appears similar to previous study measuring 7.1 by 5.7 cm. Unremarkable appearance of the left kidney. Urinary bladder appears within normal limits. Stomach/Bowel: The stomach appears normal. There is abnormal increase caliber of the small bowel loops which measure up to 3.1 cm and contain multiple air-fluid levels. Transition to normal to decreased caliber mid and distal small bowel loops identified within the left lower quadrant of the abdomen, image 63/2. Unremarkable appearance of the terminal ileum and colon. Vascular/Lymphatic: Aortic atherosclerosis. No aneurysm. Extensive branch vessel disease noted. No adenopathy within the abdomen or pelvis. Reproductive: The uterus is not visualized and may be surgically absent or atrophic. Other: There is a small volume of ascites within the abdomen and pelvis. Increased from previous exam. Musculoskeletal: Multifocal areas of  abnormal sclerosis are identified compatible with sclerotic bone metastases. IMPRESSION: 1. Abnormal  proximal small bowel dilatation with left lower quadrant transition to normal caliber small bowel loops. Findings concerning for bowel obstruction. Findings may be due to adhesive disease. Alternatively peritoneal metastasis in a patient with known malignancy could result in bowel obstruction. 2. Similar appearance of large hypermetabolic right kidney mass with ureteral involvement and associated large right iliac fossa tumor. 3. Mild increase in volume of ascites. 4. Multifocal sclerotic bone lesions. These were not hypermetabolic on CT from 03/00/9233 and may reflect areas of treated bone metastasis. 5.  Aortic Atherosclerosis (ICD10-I70.0). Electronically Signed   By: Kerby Moors M.D.   On: 02/26/2018 12:55   Dg Abd 2 Views  Result Date: 02/27/2018 CLINICAL DATA:  Diarrhea for several months EXAM: ABDOMEN - 2 VIEW COMPARISON:  02/26/2018 FINDINGS: Scattered large and small bowel gas is noted. Persistent mild small bowel dilatation remains. No free air is seen. No underlying acute bony abnormality is noted. No mass lesion is seen. Degenerative changes of lumbar spine are noted. Healed rib fracture of the twelfth rib posteriorly is noted on the left IMPRESSION: Stable small bowel dilatation.  No free air is noted. Electronically Signed   By: Inez Catalina M.D.   On: 02/27/2018 09:56   Dg Abd Acute W/chest  Result Date: 02/26/2018 CLINICAL DATA:  Mid abdominal pain with nausea and vomiting. Constipation EXAM: DG ABDOMEN ACUTE W/ 1V CHEST COMPARISON:  Head CT 02/14/2018 FINDINGS: Porta catheter with tip at the upper cavoatrial junction. There is no edema, consolidation, effusion, or pneumothorax. Normal heart size. Dilated small bowel compatible with obstruction. There is known intra-abdominal malignancy. No pneumoperitoneum or visible pneumatosis. IMPRESSION: Small bowel obstruction in this patient with  known intra-abdominal malignancy. Electronically Signed   By: Monte Fantasia M.D.   On: 02/26/2018 09:27    Anti-infectives: Anti-infectives (From admission, onward)   Start     Dose/Rate Route Frequency Ordered Stop   04/24/18 1000  vancomycin (VANCOCIN) 50 mg/mL oral solution 125 mg     125 mg Oral Every 3 DAYS 02/27/18 0854 05/24/18 0959   03/27/18 1000  vancomycin (VANCOCIN) 50 mg/mL oral solution 125 mg     125 mg Oral Every other day 02/27/18 0854 04/24/18 0959   03/20/18 1000  vancomycin (VANCOCIN) 50 mg/mL oral solution 125 mg     125 mg Oral Daily 02/27/18 0854 03/27/18 0959   03/13/18 1000  vancomycin (VANCOCIN) 50 mg/mL oral solution 125 mg     125 mg Oral 2 times daily 02/27/18 0854 03/20/18 0959   02/27/18 1000  vancomycin (VANCOCIN) 50 mg/mL oral solution 125 mg  Status:  Discontinued     125 mg Oral 4 times daily 02/27/18 0833 02/27/18 0848   02/27/18 1000  vancomycin (VANCOCIN) 50 mg/mL oral solution 125 mg     125 mg Oral 4 times daily 02/27/18 0854 03/13/18 0959      Assessment/Plan:  HTN H/o PUD GERD Chronic bronchiectasis - followed by Dr Melvyn Novas Ulcerative proctitis  Recent salmonella enteritis and c diff  Chronic diarrhea 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 Irene Limbo and Dr. Tammi Klippel, recently underwent palliative chemotherapy [facility] and [place]) and radiation but stopped after PET scan 11/15 showed worsening disease   C.diff colitis -on PO vanc -abd distention likely due to colitis -no signs of peritonitis or SBO    LOS: 2 days    Ralene Ok 02/28/2018

## 2018-02-28 NOTE — Progress Notes (Signed)
PROGRESS NOTE    Tiffany [name]  YOV:[id number] DOB: 1924/08/03 DOA: 02/26/2018 PCP: Tiffany Carol, MD   Brief Narrative: Tiffany [name] is a 84 y.o. female with a history of metastatic of the right pelvis, chronic bronchiectasis, recurrent C. difficile infection in addition to Salmonella enteritis.  Patient presented secondary to no bowel movements and emesis in addition to right-sided abdominal pain.  Imaging obtained which was concerning for possible small bowel obstruction.  General surgery was consulted.  C. difficile test obtained and is positive for recurrent infection.  Discussed with infectious disease recommended vancomycin treatment with a long taper.   Assessment & Plan:   Active Problems:   Transitional cell carcinoma of renal pelvis (HCC)   Liver metastases (HCC)   Metastatic cancer ([place])   Port catheter in place   SBO (small bowel obstruction) ([place])   Hypertension   Leukocytosis   Pressure injury of skin   Palliative care by specialist   Goals of care, counseling/discussion   Proximal small bowel dilation Initial concern for SBO. Patient with improved nausea/vomiting. Now with diarrhea. Unsuccessful NG tube placement overnight. -General surgery recommendations: repeat abdominal x-ray  Recurrent C. Difficile infection Patient treated multiple times for C. Difficile. She follows with infectious disease as an outpatient. Recent discussion for FMT consideration. Discussed with infectious disease who recommends vancomycin burst with long taper. -Vancomycin oral  AKI Baseline creatinine of 0.8. Creatinine of 1.27 on admission. In setting of decreased oral intake and likely hypovolemia. Improved with IV fluids -Continue IV fluids  Leukocytosis In setting of recurrent c. Difficile infection. -CBC in AM  Right pelvis metastatic transitional cell carcinoma Patient currently not on immuno/chemotherapy. Patient considering hospice services per admission note. Palliative  care consulted. -Palliative care recommendations: Home with hospice  Pressure injury Left buttocks, POA  Essential hypertension -Continue amlodipine  Suprapubic tenderness Patient states she has not urinated recently (just stool). Documented urine output from overnight. -Bladder scan   DVT prophylaxis: Heparin subq Code Status:   Code Status: DNR Family Communication: Tiffany [name] at bedside Disposition Plan: Discharge pending clinical improvement   Consultants:   General surgery  Palliative care  Procedures:   None  Antimicrobials:  Vancomycin PO (11/28>>    Subjective: Feeling a little bit better today. Some suprapubic tenderness.  Objective: Vitals:   02/27/18 2124 02/28/18 0530 02/28/18 0550 02/28/18 1431  BP: (!) 122/58  (!) 105/51 (!) 107/50  Pulse: 75  76 69  Resp: 16  16 17   Temp: 97.6 F (36.4 C)  (!) 97.5 F (36.4 C) 98.4 F (36.9 C)  TempSrc: Oral  Oral   SpO2: 91%  96% 97%  Weight:  47.8 kg    Height:       No intake or output data in the 24 hours ending 02/28/18 1604 Filed Weights   02/26/18 1424 02/27/18 0542 02/28/18 0530  Weight: 48.6 kg 47.7 kg 47.8 kg    Examination:  General exam: Appears calm and comfortable Respiratory system: Clear to auscultation. Respiratory effort normal. Cardiovascular system: S1 & S2 heard, RRR. No murmurs, rubs, gallops or clicks. Gastrointestinal system: Abdomen is nondistended, soft and mildly tender in RUQ. No organomegaly or masses felt. Decreased bowel sounds heard. Central nervous system: Alert and oriented. No focal neurological deficits. Extremities: No edema. No calf tenderness Skin: No cyanosis. No rashes Psychiatry: Judgement and insight appear normal. Mood & affect appropriate.     Data Reviewed: I have personally reviewed following labs and imaging studies  CBC: Recent  Labs  Lab 02/26/18 0908 02/27/18 0650 02/27/18 0941  WBC 11.7* QUESTIONABLE RESULTS, RECOMMEND RECOLLECT TO VERIFY  15.2*  NEUTROABS 10.2*  --   --   HGB 11.4* QUESTIONABLE RESULTS, RECOMMEND RECOLLECT TO VERIFY 11.3*  HCT 36.5 QUESTIONABLE RESULTS, RECOMMEND RECOLLECT TO VERIFY 36.7  MCV 109.6* QUESTIONABLE RESULTS, RECOMMEND RECOLLECT TO VERIFY 111.2*  PLT 301 QUESTIONABLE RESULTS, RECOMMEND RECOLLECT TO VERIFY 725   Basic Metabolic Panel: Recent Labs  Lab 02/26/18 0908 02/27/18 0650 02/27/18 0941  NA 134* QUESTIONABLE RESULTS, RECOMMEND RECOLLECT TO VERIFY 139  K 4.9 QUESTIONABLE RESULTS, RECOMMEND RECOLLECT TO VERIFY 4.2  CL 99 QUESTIONABLE RESULTS, RECOMMEND RECOLLECT TO VERIFY 108  CO2 25 QUESTIONABLE RESULTS, RECOMMEND RECOLLECT TO VERIFY 21*  GLUCOSE 152* QUESTIONABLE RESULTS, RECOMMEND RECOLLECT TO VERIFY 85  BUN 31* QUESTIONABLE RESULTS, RECOMMEND RECOLLECT TO VERIFY 28*  CREATININE 1.27* QUESTIONABLE RESULTS, RECOMMEND RECOLLECT TO VERIFY 1.01*  CALCIUM 7.8* QUESTIONABLE RESULTS, RECOMMEND RECOLLECT TO VERIFY 7.0*   GFR: Estimated Creatinine Clearance: 27.4 mL/min (A) (by C-G formula based on SCr of 1.01 mg/dL (H)). Liver Function Tests: Recent Labs  Lab 02/26/18 0908  AST 13*  ALT 10  ALKPHOS 43  BILITOT 1.0  PROT 6.3*  ALBUMIN 2.7*   Recent Labs  Lab 02/26/18 0908  LIPASE 21   No results for input(s): AMMONIA in the last 168 hours. Coagulation Profile: No results for input(s): INR, PROTIME in the last 168 hours. Cardiac Enzymes: No results for input(s): CKTOTAL, CKMB, CKMBINDEX, TROPONINI in the last 168 hours. BNP (last 3 results) No results for input(s): PROBNP in the last 8760 hours. HbA1C: No results for input(s): HGBA1C in the last 72 hours. CBG: No results for input(s): GLUCAP in the last 168 hours. Lipid Profile: No results for input(s): CHOL, HDL, LDLCALC, TRIG, CHOLHDL, LDLDIRECT in the last 72 hours. Thyroid Function Tests: No results for input(s): TSH, T4TOTAL, FREET4, T3FREE, THYROIDAB in the last 72 hours. Anemia Panel: No results for input(s):  VITAMINB12, FOLATE, FERRITIN, TIBC, IRON, RETICCTPCT in the last 72 hours. Sepsis Labs: Recent Labs  Lab 02/26/18 0909 02/26/18 1112  LATICACIDVEN 1.22 0.81    Recent Results (from the past 240 hour(s))  C difficile quick scan w PCR reflex     Status: Abnormal   Collection Time: 02/27/18  3:30 AM  Result Value Ref Range Status   C Diff antigen POSITIVE (A) NEGATIVE Final   C Diff toxin POSITIVE (A) NEGATIVE Final   C Diff interpretation Toxin producing C. difficile detected.  Final    Comment: CRITICAL RESULT CALLED TO, READ BACK BY AND VERIFIED WITH: Hart Carwin, N RN @0826  ON 11.28.19 BY [facility] Performed at [facility], [place] [address], [place], [place] [address]          Radiology Studies: Dg Abd 2 Views  Result Date: 02/27/2018 CLINICAL DATA:  Diarrhea for several months EXAM: ABDOMEN - 2 VIEW COMPARISON:  02/26/2018 FINDINGS: Scattered large and small bowel gas is noted. Persistent mild small bowel dilatation remains. No free air is seen. No underlying acute bony abnormality is noted. No mass lesion is seen. Degenerative changes of lumbar spine are noted. Healed rib fracture of the twelfth rib posteriorly is noted on the left IMPRESSION: Stable small bowel dilatation.  No free air is noted. Electronically Signed   By: Inez Catalina M.D.   On: 02/27/2018 09:56        Scheduled Meds: . amLODipine  5 mg Oral Daily  . heparin  5,000 Units Subcutaneous  Q8H  . mirtazapine  15 mg Oral QHS  . triamcinolone ointment  1 application Topical BID  . vancomycin  125 mg Oral QID   Followed by  . [START ON 03/13/2018] vancomycin  125 mg Oral BID   Followed by  . [START ON 03/20/2018] vancomycin  125 mg Oral Daily   Followed by  . [START ON 03/27/2018] vancomycin  125 mg Oral QODAY   Followed by  . [START ON 04/24/2018] vancomycin  125 mg Oral Q3 days   Continuous Infusions:    LOS: 2 days     Cordelia Poche, MD [facility] 02/28/2018, 4:04 PM  If  7PM-7AM, please contact night-coverage [email or web address]

## 2018-03-01 LAB — CBC
HCT: 33.5 % — ABNORMAL LOW (ref 36.0–46.0)
Hemoglobin: 10.2 g/dL — ABNORMAL LOW (ref 12.0–15.0)
MCH: 33.2 pg (ref 26.0–34.0)
MCHC: 30.4 g/dL (ref 30.0–36.0)
MCV: 109.1 fL — ABNORMAL HIGH (ref 80.0–100.0)
NRBC: 0 % (ref 0.0–0.2)
Platelets: 230 10*3/uL (ref 150–400)
RBC: 3.07 MIL/uL — ABNORMAL LOW (ref 3.87–5.11)
RDW: 14.7 % (ref 11.5–15.5)
WBC: 6.1 10*3/uL (ref 4.0–10.5)

## 2018-03-01 NOTE — Progress Notes (Signed)
PMT progress note.   The patient is resting in bed. She is resting comfortably, waiting to eat breakfast. Her husband is at the bedside.   Her son lives in [place], [place] and he checks up on them from time to time. The patient lives at home with her husband.   BP (!) 116/55 (BP Location: Right Arm)   Pulse 68   Temp 98.6 F (37 C)   Resp 14   Ht 5\' 1"  (1.549 m)   Wt 48.4 kg   SpO2 96%   BMI 20.16 kg/m  Labs and imaging noted.   Patient states that her diarrhea is improved. She feels good. Still has some mild generalized abdominal pain.   Awake alert Comfortable S 1 S 2 Clear Abdominal mild generalized tenderness No edema Non focal   PPS 40%  84 yo lady, lives at home with her husband, she has history of metastatic cancer of R pelvis, chronic bronchiectasis, she has recurrent C Diff in addition to Salmonella enteritis. Patient was initially admitted with abdominal pain, possible partial SBO. General surgery has also been following. Patient is on PO Vancomycin treatment and is recommended to have a long taper.   Recommend home with hospice on discharge, on PO Vancomycin taper. I have discussed this with the patients and her husband and they are in agreement to proceed.   25 minutes spent.   Loistine Chance MD [facility] health palliative medicine team  [phone]

## 2018-03-01 NOTE — Progress Notes (Signed)
PROGRESS NOTE    Tiffany [name]  FXT:[id number] DOB: Apr 28, 1924 DOA: 02/26/2018 PCP: [name], MD   Brief Narrative: Tiffany [name] is a 84 y.o. female with a history of metastatic of the right pelvis, chronic bronchiectasis, recurrent C. difficile infection in addition to Salmonella enteritis.  Patient presented secondary to no bowel movements and emesis in addition to right-sided abdominal pain.  Imaging obtained which was concerning for possible small bowel obstruction.  General surgery was consulted.  C. difficile test obtained and is positive for recurrent infection.  Discussed with infectious disease recommended vancomycin treatment with a long taper.   Assessment & Plan:   Principal Problem:   C. difficile colitis Active Problems:   Transitional cell carcinoma of renal pelvis ([place])   Liver metastases ([place])   Metastatic cancer ([place])   Port catheter in place   SBO (small bowel obstruction) ([place])   Hypertension   Leukocytosis   Pressure injury of skin   Palliative care by specialist   Goals of care, counseling/discussion   Proximal small bowel dilation Initial concern for SBO. Patient with improved nausea/vomiting. Now with diarrhea. Unsuccessful NG tube placement overnight. -General surgery recommendations: repeat abdominal x-ray  Recurrent C. Difficile infection Patient treated multiple times for C. Difficile. She follows with infectious disease as an outpatient. Recent discussion for FMT consideration. Discussed with infectious disease who recommends vancomycin burst with long taper. -Vancomycin oral  AKI Baseline creatinine of 0.8. Creatinine of 1.27 on admission. In setting of decreased oral intake and likely hypovolemia. Improved with IV fluids -Continue IV fluids  Leukocytosis In setting of recurrent c. Difficile infection. -CBC in AM  Right pelvis metastatic transitional cell carcinoma Patient currently not on immuno/chemotherapy. Patient considering  hospice services per admission note. Palliative care consulted. -Palliative care recommendations: Home with hospice  Pressure injury Left buttocks, POA  Essential hypertension -Continue amlodipine  Suprapubic tenderness Resolved   DVT prophylaxis: Heparin subq Code Status:   Code Status: DNR Family Communication: Husband at bedside Disposition Plan: Discharge pending improvement of stool frequency   Consultants:   General surgery  Palliative care  Procedures:   None  Antimicrobials:  Vancomycin PO (11/28>>    Subjective: Continues to feel better incrementally. Some mild abdominal pain with no nausea or vomiting.  Objective: Vitals:   02/28/18 1431 02/28/18 2137 03/01/18 0445 03/01/18 0500  BP: (!) 107/50 108/61 (!) 116/55   Pulse: 69 79 68   Resp: 17 16 14    Temp: 98.4 F (36.9 C) 98.2 F (36.8 C) 98.6 F (37 C)   TempSrc:  Oral    SpO2: 97% 96% 96%   Weight:    48.4 kg  Height:        Intake/Output Summary (Last 24 hours) at 03/01/2018 1050 Last data filed at 03/01/2018 0900 Gross per 24 hour  Intake 250 ml  Output -  Net 250 ml   Filed Weights   02/27/18 0542 02/28/18 0530 03/01/18 0500  Weight: 47.7 kg 47.8 kg 48.4 kg    Examination:  General exam: Appears calm and comfortable Respiratory system: Clear to auscultation. Respiratory effort normal. Cardiovascular system: S1 & S2 heard, RRR. No murmurs, rubs, gallops or clicks. Gastrointestinal system: Abdomen is nondistended, soft and Mildly tender in upper quadrants. No organomegaly or masses felt. Normal bowel sounds heard. Central nervous system: Alert and oriented. No focal neurological deficits. Extremities: RLE edema. No calf tenderness Skin: No cyanosis. No rashes Psychiatry: Judgement and insight appear normal. Mood & affect appropriate.  Data Reviewed: I have personally reviewed following labs and imaging studies  CBC: Recent Labs  Lab 02/26/18 0908 02/27/18 0650  02/27/18 0941 03/01/18 0434  WBC 11.7* QUESTIONABLE RESULTS, RECOMMEND RECOLLECT TO VERIFY 15.2* 6.1  NEUTROABS 10.2*  --   --   --   HGB 11.4* QUESTIONABLE RESULTS, RECOMMEND RECOLLECT TO VERIFY 11.3* 10.2*  HCT 36.5 QUESTIONABLE RESULTS, RECOMMEND RECOLLECT TO VERIFY 36.7 33.5*  MCV 109.6* QUESTIONABLE RESULTS, RECOMMEND RECOLLECT TO VERIFY 111.2* 109.1*  PLT 301 QUESTIONABLE RESULTS, RECOMMEND RECOLLECT TO VERIFY 311 154   Basic Metabolic Panel: Recent Labs  Lab 02/26/18 0908 02/27/18 0650 02/27/18 0941  NA 134* QUESTIONABLE RESULTS, RECOMMEND RECOLLECT TO VERIFY 139  K 4.9 QUESTIONABLE RESULTS, RECOMMEND RECOLLECT TO VERIFY 4.2  CL 99 QUESTIONABLE RESULTS, RECOMMEND RECOLLECT TO VERIFY 108  CO2 25 QUESTIONABLE RESULTS, RECOMMEND RECOLLECT TO VERIFY 21*  GLUCOSE 152* QUESTIONABLE RESULTS, RECOMMEND RECOLLECT TO VERIFY 85  BUN 31* QUESTIONABLE RESULTS, RECOMMEND RECOLLECT TO VERIFY 28*  CREATININE 1.27* QUESTIONABLE RESULTS, RECOMMEND RECOLLECT TO VERIFY 1.01*  CALCIUM 7.8* QUESTIONABLE RESULTS, RECOMMEND RECOLLECT TO VERIFY 7.0*   GFR: Estimated Creatinine Clearance: 27.4 mL/min (A) (by C-G formula based on SCr of 1.01 mg/dL (H)). Liver Function Tests: Recent Labs  Lab 02/26/18 0908  AST 13*  ALT 10  ALKPHOS 43  BILITOT 1.0  PROT 6.3*  ALBUMIN 2.7*   Recent Labs  Lab 02/26/18 0908  LIPASE 21   No results for input(s): AMMONIA in the last 168 hours. Coagulation Profile: No results for input(s): INR, PROTIME in the last 168 hours. Cardiac Enzymes: No results for input(s): CKTOTAL, CKMB, CKMBINDEX, TROPONINI in the last 168 hours. BNP (last 3 results) No results for input(s): PROBNP in the last 8760 hours. HbA1C: No results for input(s): HGBA1C in the last 72 hours. CBG: No results for input(s): GLUCAP in the last 168 hours. Lipid Profile: No results for input(s): CHOL, HDL, LDLCALC, TRIG, CHOLHDL, LDLDIRECT in the last 72 hours. Thyroid Function Tests: No  results for input(s): TSH, T4TOTAL, FREET4, T3FREE, THYROIDAB in the last 72 hours. Anemia Panel: No results for input(s): VITAMINB12, FOLATE, FERRITIN, TIBC, IRON, RETICCTPCT in the last 72 hours. Sepsis Labs: Recent Labs  Lab 02/26/18 0909 02/26/18 1112  LATICACIDVEN 1.22 0.81    Recent Results (from the past 240 hour(s))  C difficile quick scan w PCR reflex     Status: Abnormal   Collection Time: 02/27/18  3:30 AM  Result Value Ref Range Status   C Diff antigen POSITIVE (A) NEGATIVE Final   C Diff toxin POSITIVE (A) NEGATIVE Final   C Diff interpretation Toxin producing C. difficile detected.  Final    Comment: CRITICAL RESULT CALLED TO, READ BACK BY AND VERIFIED WITH: Hart Carwin, N RN @0826  ON 11.28.19 BY [facility] Performed at [facility], [place] [address] [place], [place] [address]          Radiology Studies: No results found.      Scheduled Meds: . amLODipine  5 mg Oral Daily  . heparin  5,000 Units Subcutaneous Q8H  . mirtazapine  15 mg Oral QHS  . triamcinolone ointment  1 application Topical BID  . vancomycin  125 mg Oral QID   Followed by  . [START ON 03/13/2018] vancomycin  125 mg Oral BID   Followed by  . [START ON 03/20/2018] vancomycin  125 mg Oral Daily   Followed by  . [START ON 03/27/2018] vancomycin  125 mg Oral QODAY   Followed by  . [  START ON 04/24/2018] vancomycin  125 mg Oral Q3 days   Continuous Infusions:    LOS: 3 days     Cordelia Poche, MD [facility] 03/01/2018, 10:50 AM  If 7PM-7AM, please contact night-coverage [email or web address]

## 2018-03-01 NOTE — Progress Notes (Signed)
Subjective/Chief Complaint: Pt states diarrhea decreasing Less abdominal bloating    Objective: Vital signs in last 24 hours: Temp:  [98.2 F (36.8 C)-98.6 F (37 C)] 98.6 F (37 C) (11/30 0445) Pulse Rate:  [68-79] 68 (11/30 0445) Resp:  [14-17] 14 (11/30 0445) BP: (107-116)/(50-61) 116/55 (11/30 0445) SpO2:  [96 %-97 %] 96 % (11/30 0445) Weight:  [48.4 kg] 48.4 kg (11/30 0500) Last BM Date: 03/01/18  Intake/Output from previous day: 11/29 0701 - 11/30 0700 In: 10 [I.V.:10] Out: -  Intake/Output this shift: No intake/output data recorded.  Constitutional: No acute distress, conversant, appears states age. Eyes: Anicteric sclerae, moist conjunctiva, no lid lag Lungs: Clear to auscultation bilaterally, normal respiratory effort CV: regular rate and rhythm, no murmurs, no peripheral edema, pedal pulses 2+ GI: Soft, no masses or hepatosplenomegaly, non-tender to palpation Skin: No rashes, palpation reveals normal turgor Psychiatric: appropriate judgment and insight, oriented to person, place, and time   Lab Results:  Recent Labs    02/27/18 0941 03/01/18 0434  WBC 15.2* 6.1  HGB 11.3* 10.2*  HCT 36.7 33.5*  PLT 311 230   BMET Recent Labs    02/27/18 0650 02/27/18 0941  NA QUESTIONABLE RESULTS, RECOMMEND RECOLLECT TO VERIFY 139  K QUESTIONABLE RESULTS, RECOMMEND RECOLLECT TO VERIFY 4.2  CL QUESTIONABLE RESULTS, RECOMMEND RECOLLECT TO VERIFY 108  CO2 QUESTIONABLE RESULTS, RECOMMEND RECOLLECT TO VERIFY 21*  GLUCOSE QUESTIONABLE RESULTS, RECOMMEND RECOLLECT TO VERIFY 85  BUN QUESTIONABLE RESULTS, RECOMMEND RECOLLECT TO VERIFY 28*  CREATININE QUESTIONABLE RESULTS, RECOMMEND RECOLLECT TO VERIFY 1.01*  CALCIUM QUESTIONABLE RESULTS, RECOMMEND RECOLLECT TO VERIFY 7.0*   PT/INR No results for input(s): LABPROT, INR in the last 72 hours. ABG No results for input(s): PHART, HCO3 in the last 72 hours.  Invalid input(s): PCO2, PO2  Studies/Results: Dg Abd 2  Views  Result Date: 02/27/2018 CLINICAL DATA:  Diarrhea for several months EXAM: ABDOMEN - 2 VIEW COMPARISON:  02/26/2018 FINDINGS: Scattered large and small bowel gas is noted. Persistent mild small bowel dilatation remains. No free air is seen. No underlying acute bony abnormality is noted. No mass lesion is seen. Degenerative changes of lumbar spine are noted. Healed rib fracture of the twelfth rib posteriorly is noted on the left IMPRESSION: Stable small bowel dilatation.  No free air is noted. Electronically Signed   By: Inez Catalina M.D.   On: 02/27/2018 09:56    Anti-infectives: Anti-infectives (From admission, onward)   Start     Dose/Rate Route Frequency Ordered Stop   04/24/18 1000  vancomycin (VANCOCIN) 50 mg/mL oral solution 125 mg     125 mg Oral Every 3 DAYS 02/27/18 0854 05/24/18 0959   03/27/18 1000  vancomycin (VANCOCIN) 50 mg/mL oral solution 125 mg     125 mg Oral Every other day 02/27/18 0854 04/24/18 0959   03/20/18 1000  vancomycin (VANCOCIN) 50 mg/mL oral solution 125 mg     125 mg Oral Daily 02/27/18 0854 03/27/18 0959   03/13/18 1000  vancomycin (VANCOCIN) 50 mg/mL oral solution 125 mg     125 mg Oral 2 times daily 02/27/18 0854 03/20/18 0959   02/27/18 1000  vancomycin (VANCOCIN) 50 mg/mL oral solution 125 mg  Status:  Discontinued     125 mg Oral 4 times daily 02/27/18 0833 02/27/18 0848   02/27/18 1000  vancomycin (VANCOCIN) 50 mg/mL oral solution 125 mg     125 mg Oral 4 times daily 02/27/18 0854 03/13/18 0959      Assessment/Plan:  HTN H/o PUD GERD Chronic bronchiectasis - followed by Dr Melvyn Novas Ulcerative proctitis  Recent salmonella enteritis and c diff  Chronic diarrhea 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 Irene Limbo and Dr. Tammi Klippel, recently underwent palliative chemotherapy [facility] Bienenstock and [place]) and radiation but stopped after PET scan 11/15 showed worsening disease   C.diff colitis-abdominal distention less  today -on PO vanc per primary -no signs of peritonitis or SBO -call with questions   LOS: 3 days    Ralene Ok 03/01/2018

## 2018-03-02 NOTE — Care Management Note (Signed)
Case Management Note  Patient Details  Name: Tiffany [name] MRN: [id number] Date of Birth: [birth year]  Subjective/Objective:   Cdiff, transitional cell carcinoma of renal pelvis, metastatic cancer, SBO               Action/Plan: Spoke to pt in room. Offered choice for Home Hospice/CMS list provided. Pt requested [facility]. Contacted [facility] with new referral. Pt states her husband is at home but having problems with his memory. Their son is here from out of town to assist with care. [facility] will arrange DME for home.   Expected Discharge Date:               Expected Discharge Plan:  Home w Hospice Care  In-House Referral:  NA  Discharge planning Services  CM Consult  Post Acute Care Choice:  Hospice Choice offered to:  Patient  DME Arranged:  Other see comment DME Agency:  [place]:  RN [facility] Agency:  [facility] [place] [facility]  Status of Service:  Completed, signed off  If discussed at [place] of Stay Meetings, dates discussed:    Additional Comments:  Erenest Rasher, RN 03/02/2018, 9:29 AM

## 2018-03-02 NOTE — Progress Notes (Signed)
[facility] ([facility])    Notified by Charlton Amor, of family request for [facility] services at home after discharge.  Chart and patient information under review by [facility] physician at this time and eligibility is pending.    Spoke with Mrs. Clanton, son and spouse at the bedside, confirmed interest, answered questions, provided emotional support, and information regarding hospice services and philosophy.      Tentative discharge for Tuesday per discussion with patient.     Please send signed and completed DNR home with the patient.   Patient will need prescriptions for discharge comfort medications (if necessary).   DME needs discussed, patient currently has the following equipment in the home:  Cane, walker DME that will be needed:  Bedside commode, will order DME.   Completed discharge summary should be faxed to [phone], once eligibility confirmed, and completed.     Please notify [facility] at [phone] (if after 5 call [phone]) when the patient is ready to leave the unit.    [facility] information given to patient.   Above information shared with RNCM.   Thank you for this referral Venia Carbon BSN, RN [facility] Liaison (listed in [place]) [phone]??

## 2018-03-02 NOTE — Progress Notes (Signed)
PROGRESS NOTE    Tiffany [name]  YQI:[id number] DOB: [birth year] DOA: 02/26/2018 PCP: [name], MD   Brief Narrative: Tiffany [name] is a 84 y.o. female with a history of metastatic of the right pelvis, chronic bronchiectasis, recurrent C. difficile infection in addition to Salmonella enteritis.  Patient presented secondary to no bowel movements and emesis in addition to right-sided abdominal pain.  Imaging obtained which was concerning for possible small bowel obstruction.  General surgery was consulted.  C. difficile test obtained and is positive for recurrent infection.  Discussed with infectious disease recommended vancomycin treatment with a long taper.   Assessment & Plan:   Principal Problem:   C. difficile colitis Active Problems:   Transitional cell carcinoma of renal pelvis ([place])   Liver metastases ([place])   Metastatic cancer ([place])   Port catheter in place   SBO (small bowel obstruction) ([place])   Hypertension   Leukocytosis   Pressure injury of skin   Palliative care by specialist   Goals of care, counseling/discussion   Proximal small bowel dilation Initial concern for SBO. Patient with improved nausea/vomiting. Now with diarrhea. Unsuccessful NG tube placement overnight. -General surgery recommendations: repeat abdominal x-ray  Recurrent C. Difficile infection Patient treated multiple times for C. Difficile. She follows with infectious disease as an outpatient. Recent discussion for FMT consideration. Discussed with infectious disease who recommends vancomycin burst with long taper. -Vancomycin oral  AKI Baseline creatinine of 0.8. Creatinine of 1.27 on admission. In setting of decreased oral intake and likely hypovolemia. Improved with IV fluids -Continue IV fluids  Leukocytosis In setting of recurrent c. Difficile infection. Resolved.  Right pelvis metastatic transitional cell carcinoma Patient currently not on immuno/chemotherapy. Patient considering hospice  services per admission note. Palliative care consulted. -Palliative care recommendations: Home with hospice  Pressure injury Left buttocks, POA  Essential hypertension -Continue amlodipine  Suprapubic tenderness Resolved   DVT prophylaxis: Heparin subq Code Status:   Code Status: DNR Family Communication: None at bedside Disposition Plan: Discharge likely in 24 hours pending improvement of stool frequency   Consultants:   General surgery  Palliative care  Procedures:   None  Antimicrobials:  Vancomycin PO (11/28>>    Subjective: Bowel movement frequency continues to improve.  Objective: Vitals:   03/01/18 0500 03/01/18 1324 03/01/18 2041 03/02/18 0515  BP:  (!) 112/54 122/67 115/62  Pulse:  68 72 61  Resp:  18 19 18   Temp:  98.4 F (36.9 C) 98.2 F (36.8 C) 98.4 F (36.9 C)  TempSrc:  Oral Oral Oral  SpO2:  94% 96% 95%  Weight: 48.4 kg   45.1 kg  Height:        Intake/Output Summary (Last 24 hours) at 03/02/2018 1147 Last data filed at 03/02/2018 0800 Gross per 24 hour  Intake 750 ml  Output -  Net 750 ml   Filed Weights   02/28/18 0530 03/01/18 0500 03/02/18 0515  Weight: 47.8 kg 48.4 kg 45.1 kg    Examination:  General exam: Appears calm and comfortable  Respiratory system: Clear to auscultation. Respiratory effort normal. Cardiovascular system: S1 & S2 heard, RRR. No murmurs, rubs, gallops or clicks. Gastrointestinal system: Abdomen is nondistended, soft and mild epigastric tenderness. No organomegaly or masses felt. Normal bowel sounds heard. Central nervous system: Alert and oriented. No focal neurological deficits. Extremities: No edema. No calf tenderness Skin: No cyanosis. No rashes Psychiatry: Judgement and insight appear normal. Mood & affect appropriate.     Data Reviewed: I  have personally reviewed following labs and imaging studies  CBC: Recent Labs  Lab 02/26/18 0908 02/27/18 0650 02/27/18 0941 03/01/18 0434  WBC 11.7*  QUESTIONABLE RESULTS, RECOMMEND RECOLLECT TO VERIFY 15.2* 6.1  NEUTROABS 10.2*  --   --   --   HGB 11.4* QUESTIONABLE RESULTS, RECOMMEND RECOLLECT TO VERIFY 11.3* 10.2*  HCT 36.5 QUESTIONABLE RESULTS, RECOMMEND RECOLLECT TO VERIFY 36.7 33.5*  MCV 109.6* QUESTIONABLE RESULTS, RECOMMEND RECOLLECT TO VERIFY 111.2* 109.1*  PLT 301 QUESTIONABLE RESULTS, RECOMMEND RECOLLECT TO VERIFY 311 027   Basic Metabolic Panel: Recent Labs  Lab 02/26/18 0908 02/27/18 0650 02/27/18 0941  NA 134* QUESTIONABLE RESULTS, RECOMMEND RECOLLECT TO VERIFY 139  K 4.9 QUESTIONABLE RESULTS, RECOMMEND RECOLLECT TO VERIFY 4.2  CL 99 QUESTIONABLE RESULTS, RECOMMEND RECOLLECT TO VERIFY 108  CO2 25 QUESTIONABLE RESULTS, RECOMMEND RECOLLECT TO VERIFY 21*  GLUCOSE 152* QUESTIONABLE RESULTS, RECOMMEND RECOLLECT TO VERIFY 85  BUN 31* QUESTIONABLE RESULTS, RECOMMEND RECOLLECT TO VERIFY 28*  CREATININE 1.27* QUESTIONABLE RESULTS, RECOMMEND RECOLLECT TO VERIFY 1.01*  CALCIUM 7.8* QUESTIONABLE RESULTS, RECOMMEND RECOLLECT TO VERIFY 7.0*   GFR: Estimated Creatinine Clearance: 25.8 mL/min (A) (by C-G formula based on SCr of 1.01 mg/dL (H)). Liver Function Tests: Recent Labs  Lab 02/26/18 0908  AST 13*  ALT 10  ALKPHOS 43  BILITOT 1.0  PROT 6.3*  ALBUMIN 2.7*   Recent Labs  Lab 02/26/18 0908  LIPASE 21   No results for input(s): AMMONIA in the last 168 hours. Coagulation Profile: No results for input(s): INR, PROTIME in the last 168 hours. Cardiac Enzymes: No results for input(s): CKTOTAL, CKMB, CKMBINDEX, TROPONINI in the last 168 hours. BNP (last 3 results) No results for input(s): PROBNP in the last 8760 hours. HbA1C: No results for input(s): HGBA1C in the last 72 hours. CBG: No results for input(s): GLUCAP in the last 168 hours. Lipid Profile: No results for input(s): CHOL, HDL, LDLCALC, TRIG, CHOLHDL, LDLDIRECT in the last 72 hours. Thyroid Function Tests: No results for input(s): TSH, T4TOTAL, FREET4,  T3FREE, THYROIDAB in the last 72 hours. Anemia Panel: No results for input(s): VITAMINB12, FOLATE, FERRITIN, TIBC, IRON, RETICCTPCT in the last 72 hours. Sepsis Labs: Recent Labs  Lab 02/26/18 0909 02/26/18 1112  LATICACIDVEN 1.22 0.81    Recent Results (from the past 240 hour(s))  C difficile quick scan w PCR reflex     Status: Abnormal   Collection Time: 02/27/18  3:30 AM  Result Value Ref Range Status   C Diff antigen POSITIVE (A) NEGATIVE Final   C Diff toxin POSITIVE (A) NEGATIVE Final   C Diff interpretation Toxin producing C. difficile detected.  Final    Comment: CRITICAL RESULT CALLED TO, READ BACK BY AND VERIFIED WITH: Hart Carwin, N RN @0826  ON 11.28.19 BY [facility] Performed at [facility], [place] [address], [place], [place] [address]          Radiology Studies: No results found.      Scheduled Meds: . amLODipine  5 mg Oral Daily  . heparin  5,000 Units Subcutaneous Q8H  . mirtazapine  15 mg Oral QHS  . triamcinolone ointment  1 application Topical BID  . vancomycin  125 mg Oral QID   Followed by  . [START ON 03/13/2018] vancomycin  125 mg Oral BID   Followed by  . [START ON 03/20/2018] vancomycin  125 mg Oral Daily   Followed by  . [START ON 03/27/2018] vancomycin  125 mg Oral QODAY   Followed by  . [START  ON 04/24/2018] vancomycin  125 mg Oral Q3 days   Continuous Infusions:    LOS: 4 days     Cordelia Poche, MD [facility] 03/02/2018, 11:47 AM  If 7PM-7AM, please contact night-coverage [email or web address]

## 2018-03-03 DIAGNOSIS — A0472 Enterocolitis due to Clostridium difficile, not specified as recurrent: Secondary | ICD-10-CM

## 2018-03-03 MED ORDER — HEPARIN SOD (PORK) LOCK FLUSH 100 UNIT/ML IV SOLN
500.0000 [IU] | INTRAVENOUS | Status: AC | PRN
  Administered 2018-03-03: 500 [IU]

## 2018-03-03 MED ORDER — VANCOMYCIN HCL 125 MG PO CAPS: ORAL_CAPSULE | ORAL | 0 refills | Status: AC

## 2018-03-03 NOTE — Discharge Instructions (Signed)
Tiffany [name] had a recurrent C. Difficile infection. This has been responding well to Vancomycin. Please continue to take this as directed as it is a long prescription taper. Please follow up with Dr. Baxter Flattery.

## 2018-03-03 NOTE — Progress Notes (Signed)
[facility])  Patient is eligible for hospice services at home with [facility], once she discharges.    DME has been ordered for pt.  Thank you, Venia Carbon BSN, [facility] Liaison (listed in [place]) [phone]

## 2018-03-03 NOTE — Progress Notes (Signed)
Physical Therapy Treatment Patient Details Name: Tiffany [name] MRN: [id number] DOB: 08-11-1924 Today's Date: 03/03/2018    History of Present Illness 84 yo female admitted 11/27 from home with abdominal pain, N/V. Pt with metastatic transitional cell carcinoma with 2 recently found pelvic masses, with palliative radiation and chemo. Pt with history of c-diff and salmonella enteritis. Other PMH includes OA, DDD of cervical spine with surgery, LE edema, gout, GERD, HTN, occipital neuralgia, ulcerative proctitis, SBO, bronchiectasis, multiple abdominal procedures.     PT Comments    Pt with improved ambulation distance today, requiring min guard for safety. Pt also performed stair navigation in preparation of D/C home, pt requiring verbal cuing and HHA for steadying during this. Pt reported fatigue after session. PT encouraged pt to use RW for ambulation in house for steadying, and encouraged to have assist with stair navigation via HHA upon return home. PT to continue to follow acutely.    Follow Up Recommendations  Home health PT;Supervision for mobility/OOB(Pt to go home with hospice )     Equipment Recommendations  None recommended by PT    Recommendations for Other Services       Precautions / Restrictions Precautions Precautions: Fall Precaution Comments: on enteric precautions Restrictions Weight Bearing Restrictions: No    Mobility  Bed Mobility Overal bed mobility: Needs Assistance Bed Mobility: Rolling;Sidelying to Sit;Sit to Supine Rolling: Modified independent (Device/Increase time) Sidelying to sit: Min assist;HOB elevated   Sit to supine: Min assist;HOB elevated   General bed mobility comments: Use of bedrails for rolling to L, increased time. Min assist for bringing bilateral LEs off EOB to transition to sitting. Min assist for sit to supine again for LE management.   Transfers Overall transfer level: Needs assistance Equipment used: Rolling walker (2  wheeled) Transfers: Sit to/from [facility] Sit to Stand: Supervision Stand pivot transfers: +2 physical assistance;Min assist       General transfer comment: Supervision provided for safety. Increased time to come to standing. Sit to stand x3, once from bed, once from toilet, and once from recliner post-stair training. Stand pivot from recliner to chair, min assist +2 for HHA+2  Ambulation/Gait Ambulation/Gait assistance: Min guard Gait Distance (Feet): 85 Feet(20 ft to and from bathroom, 65 ft hallway ambulation to stairwell ) Assistive device: Rolling walker (2 wheeled) Gait Pattern/deviations: Step-through pattern;Decreased stride length;Trunk flexed Gait velocity: slightly    General Gait Details: Gown and gloves donned for hallway ambulation. Verbal cuing for placement in RW, min guard for safety.    Stairs Stairs: Yes Stairs assistance: Min assist Stair Management: One rail Left Number of Stairs: 10 General stair comments: HHA +1, min assist for sequencing, steadying via HHA. Pt first attempting to climb stairs by crawling up with one hand on L handrail and R hand on step. PT advised use of L handrail and HHA or cane for return home. Pt's son or husband will be able to assist.    Wheelchair Mobility    Modified Rankin (Stroke Patients Only)       Balance Overall balance assessment: Needs assistance Sitting-balance support: No upper extremity supported Sitting balance-Leahy Scale: Good     Standing balance support: Bilateral upper extremity supported Standing balance-Leahy Scale: Poor Standing balance comment: relies on RW, PT for support                             Cognition Arousal/Alertness: Awake/alert Behavior During Therapy: [facility] for  tasks assessed/performed Overall Cognitive Status: Within Functional Limits for tasks assessed                                        Exercises      General Comments General  comments (skin integrity, edema, etc.): RLE edema still present      Pertinent Vitals/Pain Pain Assessment: No/denies pain    Home Living                      Prior Function            PT Goals (current goals can now be found in the care plan section) Acute Rehab PT Goals Patient Stated Goal: go home with husband PT Goal Formulation: With patient Time For Goal Achievement: 03/14/18 Potential to Achieve Goals: Good Progress towards PT goals: Progressing toward goals    Frequency    Min 3X/week      PT Plan Current plan remains appropriate    Co-evaluation              AM-PAC PT "6 Clicks" Mobility   Outcome Measure  Help needed turning from your back to your side while in a flat bed without using bedrails?: None Help needed moving from lying on your back to sitting on the side of a flat bed without using bedrails?: A Little Help needed moving to and from a bed to a chair (including a wheelchair)?: A Little Help needed standing up from a chair using your arms (e.g., wheelchair or bedside chair)?: A Little Help needed to walk in hospital room?: A Little Help needed climbing 3-5 steps with a railing? : A Little 6 Click Score: 19    End of Session Equipment Utilized During Treatment: Gait belt Activity Tolerance: Patient tolerated treatment well Patient left: with call bell/phone within reach;in bed(Pt states RN lets her mobilize to and from bedside commode with use of RW, and if BSC is placed right beside the bed.) Nurse Communication: Mobility status PT Visit Diagnosis: Other abnormalities of gait and mobility (R26.89);Unsteadiness on feet (R26.81)     Time: 5465-0354 PT Time Calculation (min) (ACUTE ONLY): 28 min  Charges:  $Gait Training: 8-22 mins $Therapeutic Activity: 8-22 mins                     Julien Girt, PT Acute Rehabilitation Services Pager [phone]  Office [phone]    [place] 03/03/2018, 1:24 PM

## 2018-03-03 NOTE — Care Management Important Message (Signed)
Important Message  Patient Details  Name: Tiffany [name] MRN: [id number] Date of Birth: November 15, 1924   Medicare Important Message Given:  Yes    Kerin Salen 03/03/2018, 10:57 AMImportant Message  Patient Details  Name: Tiffany [name] MRN: [id number] Date of Birth: [birth year]   Medicare Important Message Given:  Yes    Kerin Salen 03/03/2018, 10:57 AM

## 2018-03-04 NOTE — Discharge Summary (Signed)
Physician Discharge Summary  Tiffany [name]:[id number] DOB: March 27, 1925 DOA: 02/26/2018  PCP: [name], MD  Admit date: 02/26/2018 Discharge date: 03/04/2018  Admitted From: Home Disposition: Home with hospice  Recommendations for Outpatient Follow-up:  1. Follow up with PCP in 1 week 2. Follow up with infectious disease for C. Diff management 3. Please obtain BMP/CBC in one week 4. Please follow up on the following pending results: None  Home Health: Hospice Equipment/Devices: None  Discharge Condition: Stable CODE STATUS: DNR Diet recommendation: Low fiber   Brief/Interim Summary:  Admission HPI written by Desiree Hane, MD   Chief Complaint: No bowel movements  HPI: Tiffany [name] is a 84 y.o. female with medical history significant for metastatic transitional cell carcinoma of right pelvis, chronic bronchiectasis, prior history of Salmonella enteritis and C. difficile, Hypertension who presents on 02/26/2018 with several days.    She previously had significant diarrhea for 6 months ( C. Diff and Salmonella). She describes it as loose watery 2-3 BMs for which she was taking scheduled imodium with no relief. She recently received octreotide infusion by oncology office this past Friday. This past Sunday she started noticing "problems" including right sided abdominal pain and no bowel movements. No exacerbating or alleviating factors.  She had one episode of emesis yesterday.  She has a poor appetite in general for several months because of poor taste.  She previously was    ED Course: Afebrile, hemodynamically stable, lactic acidNormal limits, CMP significant for creatinine 1.27, BUN 31, sodium 134, lipase 21 CBC with WBC 11.7, hemoglobin 11.4 CT abdomen showed proximal small bowel wall dilatation with transition to normal small bowel loops concerning for bowel obstruction due to either adhesive disease alternatively peritoneal metastasis   Hospital  course:  Proximal small bowel dilation Initial concern for SBO. Patient with improved nausea/vomiting. Now with diarrhea. Unsuccessful NG tube placement overnight. General surgery consulted but thought secondary to C. Diff infection.  Recurrent C. Difficile infection Patient treated multiple times for C. Difficile. She follows with infectious disease as an outpatient. Recent discussion for FMT consideration. Discussed with infectious disease who recommends vancomycin burst with long taper. Stool frequency improved and patient discharged with vancomycin  AKI Baseline creatinine of 0.8. Creatinine of 1.27 on admission. In setting of decreased oral intake and likely hypovolemia. Improved with IV fluids  Leukocytosis In setting of recurrent c. Difficile infection. Resolved.  Right pelvis metastatic transitional cell carcinoma Patient currently not on immuno/chemotherapy. Patient considering hospice services per admission note. Palliative care consulted and helped to coordinate home hospice.  Pressure injury Left buttocks, POA  Essential hypertension Continued amlodipine  Suprapubic tenderness Resolved  Discharge Diagnoses:  Principal Problem:   C. difficile colitis Active Problems:   Transitional cell carcinoma of renal pelvis ([place])   Liver metastases ([place])   Metastatic cancer ([place])   Port catheter in place   SBO (small bowel obstruction) ([place])   Hypertension   Leukocytosis   Pressure injury of skin   Palliative care by specialist   Goals of care, counseling/discussion    Discharge Instructions  Discharge Instructions    Call MD for:  difficulty breathing, headache or visual disturbances   Complete by:  As directed    Call MD for:  severe uncontrolled pain   Complete by:  As directed    Call MD for:  temperature >100.4   Complete by:  As directed    Increase activity slowly   Complete by:  As directed  Allergies as of 03/03/2018      Reactions    Nitrofurantoin Other (See Comments)   Trembling   Indomethacin Swelling, Other (See Comments)   Tongue swelling   Lisinopril Swelling, Other (See Comments)   Tongue swelling      Medication List    STOP taking these medications   fidaxomicin 200 MG Tabs tablet Commonly known as:  DIFICID   furosemide 20 MG tablet Commonly known as:  LASIX   ondansetron 4 MG disintegrating tablet Commonly known as:  ZOFRAN-ODT   sulfamethoxazole-trimethoprim 800-160 MG tablet Commonly known as:  BACTRIM DS,SEPTRA DS     TAKE these medications   acetaminophen 650 MG CR tablet Commonly known as:  TYLENOL Take 1,300 mg by mouth 2 (two) times daily.   amLODipine 5 MG tablet Commonly known as:  NORVASC Take 5 mg by mouth daily.   cholestyramine 4 g packet Commonly known as:  QUESTRAN MIX AND DRINK 1 PACKET(4 GRAMS) BY MOUTH TWICE DAILY   estradiol 0.5 MG tablet Commonly known as:  ESTRACE Take 0.5 mg by mouth daily.   lactose free nutrition Liqd Take 237 mLs by mouth daily.   feeding supplement (ENSURE ENLIVE) Liqd Take 237 mLs by mouth 2 (two) times daily between meals.   FLUTTER Devi 1 each by Does not apply route daily.   iron polysaccharides 150 MG capsule Commonly known as:  NIFEREX Take 1 capsule (150 mg total) by mouth daily.   mirtazapine 15 MG tablet Commonly known as:  REMERON TAKE 1 TABLET(15 MG) BY MOUTH AT BEDTIME What changed:    how much to take  how to take this  when to take this  additional instructions   PROBIOTIC DAILY PO Take 1 capsule by mouth every evening.   triamcinolone ointment 0.5 % Commonly known as:  KENALOG APPLY EXTERNALLY TO THE AFFECTED AREA TWICE DAILY What changed:  See the new instructions.   vancomycin 125 MG capsule Commonly known as:  VANCOCIN Take 1 capsule (125 mg total) by mouth 4 (four) times daily for 12 days, THEN 1 capsule (125 mg total) 2 (two) times daily for 7 days, THEN 1 capsule (125 mg total) daily for 7 days,  THEN 1 capsule (125 mg total) every other day for 28 days, THEN 1 capsule (125 mg total) every 3 (three) days for 28 days. Start taking on:  03/03/2018 What changed:  See the new instructions.   vitamin B-12 100 MCG tablet Commonly known as:  CYANOCOBALAMIN Take 100 mcg by mouth daily.      Follow-up Information    [facility] Follow up.   Specialty:  Hospice and Palliative Medicine Why:  Home Hospice RN -will call to arrange initial visit Contact information: [place] [address] [phone]        Carlyle Basques, MD. Schedule an appointment as soon as possible for a visit in 1 week(s).   Specialty:  Infectious Diseases Why:  C. difficile Contact information: [address] [place] [address] [phone]          Allergies  Allergen Reactions  . Nitrofurantoin Other (See Comments)    Trembling  . Indomethacin Swelling and Other (See Comments)    Tongue swelling  . Lisinopril Swelling and Other (See Comments)    Tongue swelling    Consultations:  General surgery   Procedures/Studies: Ct Abdomen Pelvis Wo Contrast  Result Date: 02/26/2018 CLINICAL DATA:  Nausea and vomiting. EXAM: CT ABDOMEN AND  PELVIS WITHOUT CONTRAST TECHNIQUE: Multidetector CT imaging of the abdomen and pelvis was performed following the standard protocol without IV contrast. COMPARISON:  PET-CT 02/14/2018 FINDINGS: Lower chest: No acute abnormality. Hepatobiliary: No focal liver abnormality is seen. Status post cholecystectomy. No biliary dilatation. Pancreas: Mild diffuse atrophy of the pancreas. No mass or inflammation. Spleen: Normal in size without focal abnormality. Adrenals/Urinary Tract: Normal adrenal glands. Large necrotic mass within the right abdomen compatible with known urothelial neoplasm is again noted measuring 11.2 by 9.5 cm, image 41/2. Soft tissue extending into the right ureter is again noted. Associated mass within the  iliac fossa appears similar to previous study measuring 7.1 by 5.7 cm. Unremarkable appearance of the left kidney. Urinary bladder appears within normal limits. Stomach/Bowel: The stomach appears normal. There is abnormal increase caliber of the small bowel loops which measure up to 3.1 cm and contain multiple air-fluid levels. Transition to normal to decreased caliber mid and distal small bowel loops identified within the left lower quadrant of the abdomen, image 63/2. Unremarkable appearance of the terminal ileum and colon. Vascular/Lymphatic: Aortic atherosclerosis. No aneurysm. Extensive branch vessel disease noted. No adenopathy within the abdomen or pelvis. Reproductive: The uterus is not visualized and may be surgically absent or atrophic. Other: There is a small volume of ascites within the abdomen and pelvis. Increased from previous exam. Musculoskeletal: Multifocal areas of abnormal sclerosis are identified compatible with sclerotic bone metastases. IMPRESSION: 1. Abnormal proximal small bowel dilatation with left lower quadrant transition to normal caliber small bowel loops. Findings concerning for bowel obstruction. Findings may be due to adhesive disease. Alternatively peritoneal metastasis in a patient with known malignancy could result in bowel obstruction. 2. Similar appearance of large hypermetabolic right kidney mass with ureteral involvement and associated large right iliac fossa tumor. 3. Mild increase in volume of ascites. 4. Multifocal sclerotic bone lesions. These were not hypermetabolic on CT from 42/59/5638 and may reflect areas of treated bone metastasis. 5.  Aortic Atherosclerosis (ICD10-I70.0). Electronically Signed   By: Kerby Moors M.D.   On: 02/26/2018 12:55   Nm Pet Image Restag (ps) Skull Base To Thigh  Result Date: 02/14/2018 CLINICAL DATA:  Subsequent treatment strategy for transitional cell carcinoma of the right renal pelvis. EXAM: NUCLEAR MEDICINE PET SKULL BASE TO THIGH  TECHNIQUE: 5.2 mCi F-18 FDG was injected intravenously. Full-ring PET imaging was performed from the skull base to thigh after the radiotracer. CT data was obtained and used for attenuation correction and anatomic localization. Fasting blood glucose: 77 mg/dl COMPARISON:  PET-CT 04/04/2017 and 09/03/2017 FINDINGS: Mediastinal blood pool activity: SUV max 1.8 NECK: No hypermetabolic cervical lymph nodes are identified.There are no lesions of the pharyngeal mucosal space. Incidental CT findings: Bilateral carotid atherosclerosis. CHEST: There are multiple new nodular areas of hypermetabolic activity within the mediastinum, left hilum and left axilla consistent with adenopathy. An approximately 9 mm subcarinal node on image 65/4 has an SUV max of 5.5. Left hilar node has an SUV max of 4.6. Left axillary nodes have an SUV max of 4.1. There is no suspicious pulmonary activity or nodularity. Incidental CT findings: Right IJ Port-A-Cath extends to the superior cavoatrial junction. There is diffuse atherosclerosis of the aorta, great vessels and coronary arteries. Mild emphysema is noted. ABDOMEN/PELVIS: The large, centrally necrotic right abdominal mass has slightly enlarged, measuring 11.7 x 10.2 cm on image 119/4 (previously 10.6 x 9.7 cm). The associated peripheral hypermetabolic activity has improved. The greatest metabolic activity is present laterally (SUV max 17.2).  Separate from this mass, there is a new heterogeneous right pelvic mass measuring 8.0 x 5.7 cm on image 138/4. This mass is also hypermetabolic with an SUV max of 27.5. There is probable hypermetabolic tumor extension along the right ureter. There is probable hypermetabolic tumor within the right renal vein and IVC. There is no abnormal metabolic activity within the liver, spleen, pancreas or left kidney. Incidental CT findings: Diffuse aortic and branch vessel atherosclerosis. There is increased edema throughout the subcutaneous and deep abdominal fat.  SKELETON: There is no hypermetabolic activity to suggest osseous metastatic disease. Incidental CT findings: There are degenerative changes throughout the spine. IMPRESSION: 1. New hypermetabolic mediastinal, left hilar and left axillary lymph nodes consistent with metastatic disease. 2. Although the hypermetabolic activity associated with the dominant necrotic mass in the right mid abdomen has improved, the mass is somewhat larger, and there is a new hypermetabolic mass in the right false pelvis. There is probable tumor extension along the right ureter and IVC. 3. No evidence of osseous or pulmonary metastatic disease. Electronically Signed   By: Richardean Sale M.D.   On: 02/14/2018 15:23   Dg Abd 2 Views  Result Date: 02/27/2018 CLINICAL DATA:  Diarrhea for several months EXAM: ABDOMEN - 2 VIEW COMPARISON:  02/26/2018 FINDINGS: Scattered large and small bowel gas is noted. Persistent mild small bowel dilatation remains. No free air is seen. No underlying acute bony abnormality is noted. No mass lesion is seen. Degenerative changes of lumbar spine are noted. Healed rib fracture of the twelfth rib posteriorly is noted on the left IMPRESSION: Stable small bowel dilatation.  No free air is noted. Electronically Signed   By: Inez Catalina M.D.   On: 02/27/2018 09:56   Dg Abd Acute W/chest  Result Date: 02/26/2018 CLINICAL DATA:  Mid abdominal pain with nausea and vomiting. Constipation EXAM: DG ABDOMEN ACUTE W/ 1V CHEST COMPARISON:  Head CT 02/14/2018 FINDINGS: Porta catheter with tip at the upper cavoatrial junction. There is no edema, consolidation, effusion, or pneumothorax. Normal heart size. Dilated small bowel compatible with obstruction. There is known intra-abdominal malignancy. No pneumoperitoneum or visible pneumatosis. IMPRESSION: Small bowel obstruction in this patient with known intra-abdominal malignancy. Electronically Signed   By: Monte Fantasia M.D.   On: 02/26/2018 09:27        Subjective: Stool frequency improved. No abdominal pain.  Discharge Exam: Vitals:   03/02/18 2009 03/03/18 0503  BP: 109/62 (!) 110/54  Pulse: 73 73  Resp: 16 16  Temp: 98.7 F (37.1 C) 98.3 F (36.8 C)  SpO2: 93% 96%   Vitals:   03/02/18 1612 03/02/18 2009 03/03/18 0459 03/03/18 0503  BP: (!) 93/59 109/62  (!) 110/54  Pulse: 89 73  73  Resp: 18 16  16   Temp: 98.7 F (37.1 C) 98.7 F (37.1 C)  98.3 F (36.8 C)  TempSrc: Oral Oral  Oral  SpO2: 99% 93%  96%  Weight:   46.2 kg   Height:        General: Pt is alert, awake, not in acute distress Cardiovascular: RRR, S1/S2 +, no rubs, no gallops Respiratory: CTA bilaterally, no wheezing, no rhonchi Abdominal: Soft, NT, ND, bowel sounds + Extremities: no edema, no cyanosis    The results of significant diagnostics from this hospitalization (including imaging, microbiology, ancillary and laboratory) are listed below for reference.     Microbiology: Recent Results (from the past 240 hour(s))  C difficile quick scan w PCR reflex  Status: Abnormal   Collection Time: 02/27/18  3:30 AM  Result Value Ref Range Status   C Diff antigen POSITIVE (A) NEGATIVE Final   C Diff toxin POSITIVE (A) NEGATIVE Final   C Diff interpretation Toxin producing C. difficile detected.  Final    Comment: CRITICAL RESULT CALLED TO, READ BACK BY AND VERIFIED WITH: Hart Carwin, N RN @0826  ON 11.28.19 BY [facility] Performed at [facility], [place] [address], [place], [place] [address]      Labs: BNP (last 3 results) Recent Labs    07/28/17 1358  BNP 2,409.7*   Basic Metabolic Panel: Recent Labs  Lab 02/26/18 0908 02/27/18 0650 02/27/18 0941  NA 134* QUESTIONABLE RESULTS, RECOMMEND RECOLLECT TO VERIFY 139  K 4.9 QUESTIONABLE RESULTS, RECOMMEND RECOLLECT TO VERIFY 4.2  CL 99 QUESTIONABLE RESULTS, RECOMMEND RECOLLECT TO VERIFY 108  CO2 25 QUESTIONABLE RESULTS, RECOMMEND RECOLLECT TO VERIFY 21*  GLUCOSE 152*  QUESTIONABLE RESULTS, RECOMMEND RECOLLECT TO VERIFY 85  BUN 31* QUESTIONABLE RESULTS, RECOMMEND RECOLLECT TO VERIFY 28*  CREATININE 1.27* QUESTIONABLE RESULTS, RECOMMEND RECOLLECT TO VERIFY 1.01*  CALCIUM 7.8* QUESTIONABLE RESULTS, RECOMMEND RECOLLECT TO VERIFY 7.0*   Liver Function Tests: Recent Labs  Lab 02/26/18 0908  AST 13*  ALT 10  ALKPHOS 43  BILITOT 1.0  PROT 6.3*  ALBUMIN 2.7*   Recent Labs  Lab 02/26/18 0908  LIPASE 21   No results for input(s): AMMONIA in the last 168 hours. CBC: Recent Labs  Lab 02/26/18 0908 02/27/18 0650 02/27/18 0941 03/01/18 0434  WBC 11.7* QUESTIONABLE RESULTS, RECOMMEND RECOLLECT TO VERIFY 15.2* 6.1  NEUTROABS 10.2*  --   --   --   HGB 11.4* QUESTIONABLE RESULTS, RECOMMEND RECOLLECT TO VERIFY 11.3* 10.2*  HCT 36.5 QUESTIONABLE RESULTS, RECOMMEND RECOLLECT TO VERIFY 36.7 33.5*  MCV 109.6* QUESTIONABLE RESULTS, RECOMMEND RECOLLECT TO VERIFY 111.2* 109.1*  PLT 301 QUESTIONABLE RESULTS, RECOMMEND RECOLLECT TO VERIFY 311 230   Cardiac Enzymes: No results for input(s): CKTOTAL, CKMB, CKMBINDEX, TROPONINI in the last 168 hours. BNP: Invalid input(s): POCBNP CBG: No results for input(s): GLUCAP in the last 168 hours. D-Dimer No results for input(s): DDIMER in the last 72 hours. Hgb A1c No results for input(s): HGBA1C in the last 72 hours. Lipid Profile No results for input(s): CHOL, HDL, LDLCALC, TRIG, CHOLHDL, LDLDIRECT in the last 72 hours. Thyroid function studies No results for input(s): TSH, T4TOTAL, T3FREE, THYROIDAB in the last 72 hours.  Invalid input(s): FREET3 Anemia work up No results for input(s): VITAMINB12, FOLATE, FERRITIN, TIBC, IRON, RETICCTPCT in the last 72 hours. Urinalysis    Component Value Date/Time   COLORURINE YELLOW 07/18/2017 1208   APPEARANCEUR HAZY (A) 07/18/2017 1208   LABSPEC 1.014 07/18/2017 1208   LABSPEC 1.005 11/04/2015 1503   PHURINE 5.0 07/18/2017 1208   GLUCOSEU NEGATIVE 07/18/2017 1208    GLUCOSEU Negative 11/04/2015 1503   HGBUR MODERATE (A) 07/18/2017 1208   BILIRUBINUR NEGATIVE 07/18/2017 1208   BILIRUBINUR Negative 11/04/2015 1503   KETONESUR NEGATIVE 07/18/2017 1208   PROTEINUR 30 (A) 07/18/2017 1208   UROBILINOGEN 0.2 11/04/2015 1503   NITRITE NEGATIVE 07/18/2017 1208   LEUKOCYTESUR NEGATIVE 07/18/2017 1208   LEUKOCYTESUR Small 11/04/2015 1503   Sepsis Labs Invalid input(s): PROCALCITONIN,  WBC,  LACTICIDVEN Microbiology Recent Results (from the past 240 hour(s))  C difficile quick scan w PCR reflex     Status: Abnormal   Collection Time: 02/27/18  3:30 AM  Result Value Ref Range Status   C Diff antigen POSITIVE (A)  NEGATIVE Final   C Diff toxin POSITIVE (A) NEGATIVE Final   C Diff interpretation Toxin producing C. difficile detected.  Final    Comment: CRITICAL RESULT CALLED TO, READ BACK BY AND VERIFIED WITH: Hart Carwin, N RN @0826  ON 11.28.19 BY [facility] Performed at [facility], [place] [address], [place], [place] [address]     SIGNED:   Cordelia Poche, MD [facility] 03/04/2018, 5:23 PM

## 2018-03-10 ENCOUNTER — Telehealth: Payer: Self-pay | Admitting: *Deleted

## 2018-03-10 NOTE — Telephone Encounter (Signed)
Tiffany [name], Hospice nurse left VM: patient has entered hospice care and has asked to have further MD appts and treatments cancelled. Received fax dated 03/07/18 from [facility] that patient entered Hospice care on 12/3, attending is Dr. Delfina Redwood.

## 2018-03-21 ENCOUNTER — Ambulatory Visit: Payer: Medicare Other

## 2018-03-21 ENCOUNTER — Other Ambulatory Visit: Payer: Medicare Other

## 2018-03-21 ENCOUNTER — Ambulatory Visit: Payer: Medicare Other | Admitting: Hematology

## 2018-05-03 DEATH — deceased

## 2019-03-13 IMAGING — CR DG CHEST 2V
2 series · 2 of 2 positions shown · non-contrast
Comparison: For 18 19.

CLINICAL DATA: Leg and feet swelling for several days.

EXAM:
CHEST - 2 VIEW

[w chest lat]
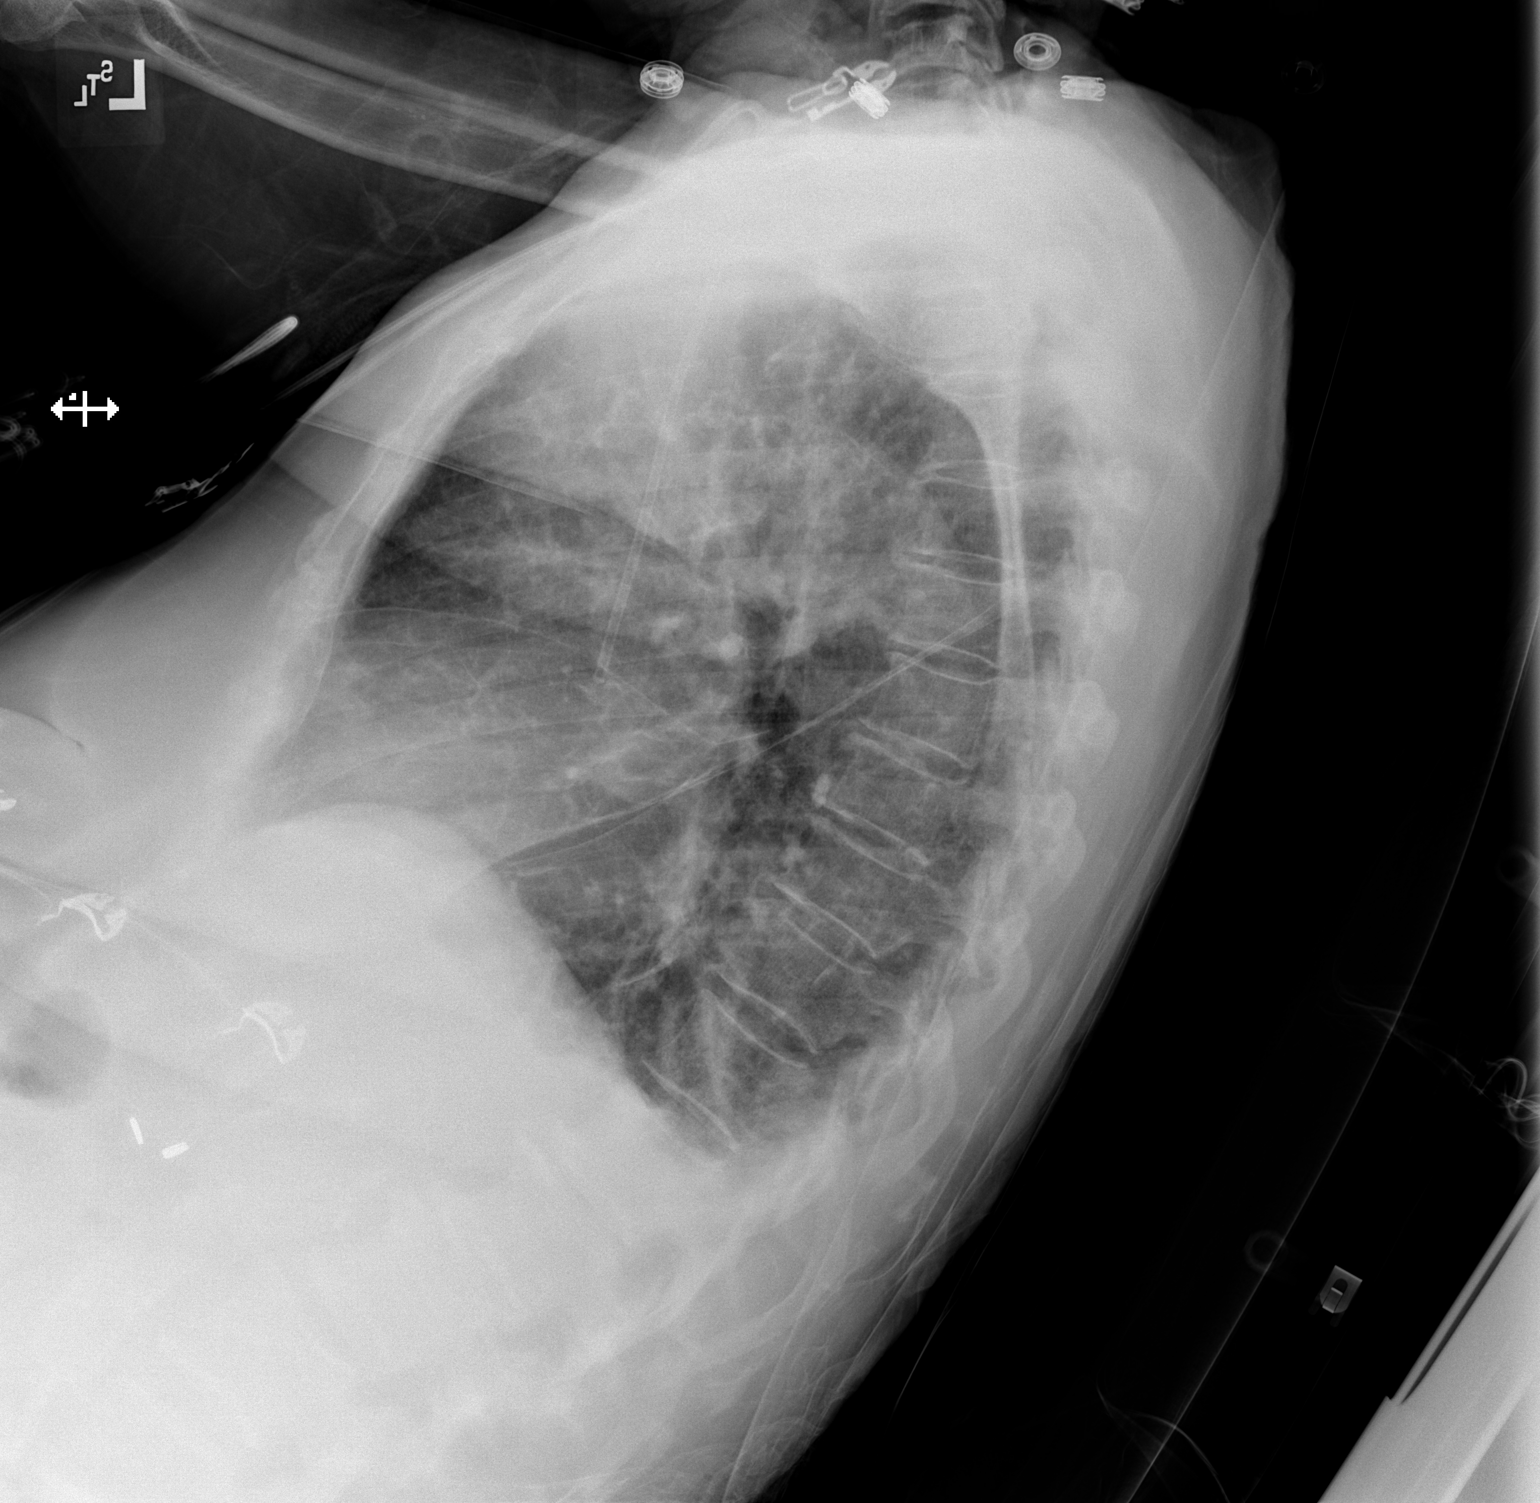

[x chest ap]
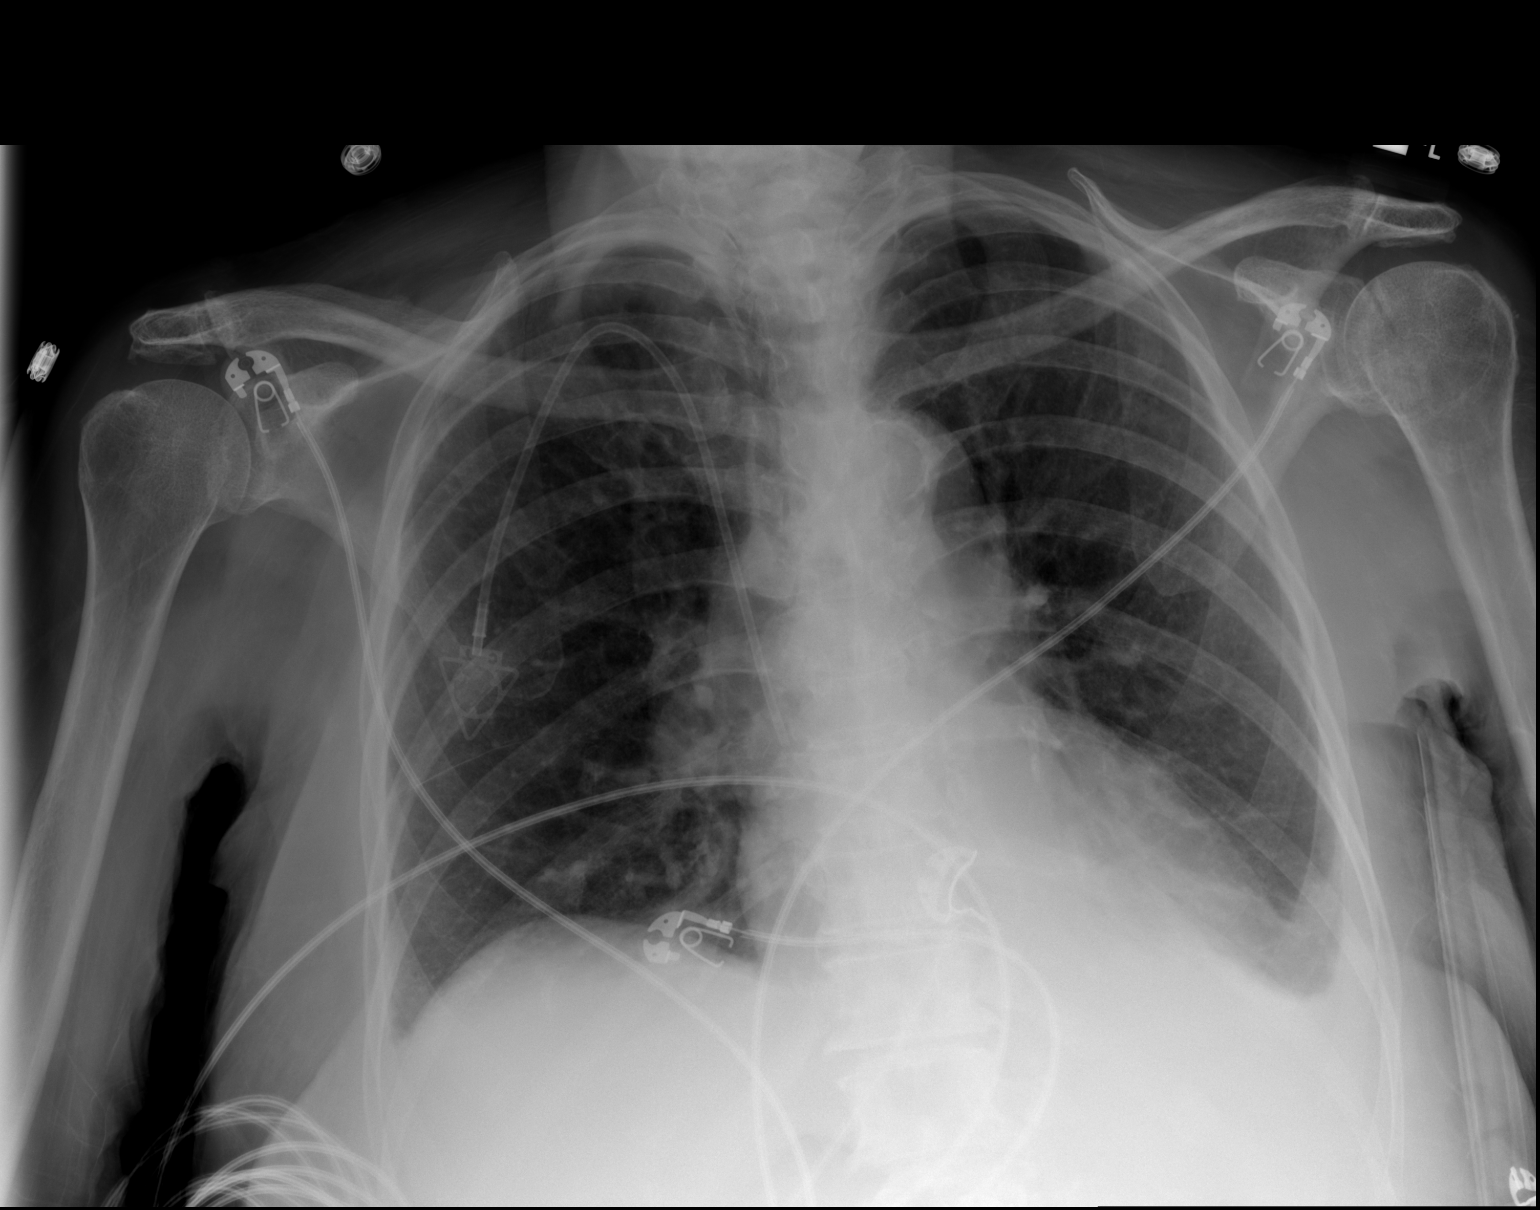

[2 of 2 positions shown; findings below may reference images not displayed]

FINDINGS: Heart size is within normal limits. Port-A-Cath good position. LEFT
pleural effusion, with indeterminate retrocardiac density. Small
RIGHT effusion. Worsening aeration from priors.
IMPRESSION: Normal heart size with interval development of LEFT base process of
uncertain etiology. Moderate LEFT pleural effusion is new from
priors.
